# Patient Record
Sex: Male | Born: 1945
Health system: Southern US, Community
[De-identification: ages and names within clinical notes are randomized; demographics above are authoritative.]

## PROBLEM LIST (undated history)

## (undated) DIAGNOSIS — I10 Essential (primary) hypertension: Secondary | ICD-10-CM

## (undated) DIAGNOSIS — I517 Cardiomegaly: Secondary | ICD-10-CM

## (undated) DIAGNOSIS — C801 Malignant (primary) neoplasm, unspecified: Secondary | ICD-10-CM

## (undated) DIAGNOSIS — Z9289 Personal history of other medical treatment: Secondary | ICD-10-CM

## (undated) DIAGNOSIS — Z8639 Personal history of other endocrine, nutritional and metabolic disease: Secondary | ICD-10-CM

## (undated) DIAGNOSIS — D649 Anemia, unspecified: Secondary | ICD-10-CM

## (undated) DIAGNOSIS — D509 Iron deficiency anemia, unspecified: Secondary | ICD-10-CM

## (undated) DIAGNOSIS — M2550 Pain in unspecified joint: Secondary | ICD-10-CM

## (undated) DIAGNOSIS — R600 Localized edema: Secondary | ICD-10-CM

## (undated) DIAGNOSIS — M1990 Unspecified osteoarthritis, unspecified site: Secondary | ICD-10-CM

## (undated) DIAGNOSIS — G4730 Sleep apnea, unspecified: Secondary | ICD-10-CM

## (undated) DIAGNOSIS — K909 Intestinal malabsorption, unspecified: Secondary | ICD-10-CM

## (undated) DIAGNOSIS — K219 Gastro-esophageal reflux disease without esophagitis: Secondary | ICD-10-CM

## (undated) DIAGNOSIS — R0602 Shortness of breath: Secondary | ICD-10-CM

## (undated) DIAGNOSIS — N200 Calculus of kidney: Secondary | ICD-10-CM

## (undated) DIAGNOSIS — E785 Hyperlipidemia, unspecified: Secondary | ICD-10-CM

## (undated) DIAGNOSIS — D696 Thrombocytopenia, unspecified: Secondary | ICD-10-CM

## (undated) DIAGNOSIS — D45 Polycythemia vera: Secondary | ICD-10-CM

## (undated) DIAGNOSIS — M549 Dorsalgia, unspecified: Secondary | ICD-10-CM

## (undated) DIAGNOSIS — J449 Chronic obstructive pulmonary disease, unspecified: Secondary | ICD-10-CM

## (undated) DIAGNOSIS — N529 Male erectile dysfunction, unspecified: Secondary | ICD-10-CM

## (undated) DIAGNOSIS — N400 Enlarged prostate without lower urinary tract symptoms: Secondary | ICD-10-CM

## (undated) DIAGNOSIS — R05 Cough: Secondary | ICD-10-CM

## (undated) DIAGNOSIS — I6529 Occlusion and stenosis of unspecified carotid artery: Secondary | ICD-10-CM

## (undated) DIAGNOSIS — N486 Induration penis plastica: Secondary | ICD-10-CM

## (undated) HISTORY — DX: Pain in unspecified joint: M25.50

## (undated) HISTORY — DX: Induration penis plastica: N48.6

## (undated) HISTORY — DX: Anemia, unspecified: D64.9

## (undated) HISTORY — DX: Iron deficiency anemia, unspecified: D50.9

## (undated) HISTORY — DX: Personal history of other medical treatment: Z92.89

## (undated) HISTORY — DX: Essential (primary) hypertension: I10

## (undated) HISTORY — DX: Cardiomegaly: I51.7

## (undated) HISTORY — DX: Calculus of kidney: N20.0

## (undated) HISTORY — PX: KNEE SURGERY: SHX244

## (undated) HISTORY — DX: Cough: R05

## (undated) HISTORY — PX: EYE SURGERY: SHX253

## (undated) HISTORY — DX: Polycythemia vera: D45

## (undated) HISTORY — DX: Gastro-esophageal reflux disease without esophagitis: K21.9

## (undated) HISTORY — DX: Personal history of other endocrine, nutritional and metabolic disease: Z86.39

## (undated) HISTORY — DX: Hyperlipidemia, unspecified: E78.5

## (undated) HISTORY — DX: Localized edema: R60.0

## (undated) HISTORY — DX: Malignant (primary) neoplasm, unspecified: C80.1

## (undated) HISTORY — DX: Chronic obstructive pulmonary disease, unspecified: J44.9

## (undated) HISTORY — DX: Thrombocytopenia, unspecified: D69.6

## (undated) HISTORY — PX: SHOULDER SURGERY: SHX246

## (undated) HISTORY — DX: Dorsalgia, unspecified: M54.9

## (undated) HISTORY — PX: APPENDECTOMY: SHX54

## (undated) HISTORY — DX: Occlusion and stenosis of unspecified carotid artery: I65.29

## (undated) HISTORY — DX: Unspecified osteoarthritis, unspecified site: M19.90

## (undated) HISTORY — DX: Intestinal malabsorption, unspecified: K90.9

## (undated) HISTORY — DX: Male erectile dysfunction, unspecified: N52.9

## (undated) HISTORY — DX: Benign prostatic hyperplasia without lower urinary tract symptoms: N40.0

## (undated) HISTORY — DX: Sleep apnea, unspecified: G47.30

## (undated) NOTE — *Deleted (*Deleted)
RN at bedside with doppler. Pt's wife and 2 daughters wanting to record pt's heartbeat on their cell phones for keepsake. Pt lying in bed resting comfortably and denies needs at this time. Family has been swabbing pt's mouth with mouth swabs and helping him sip on water. RN also provided pt with popsicle that family spoon feeds out of a cup. Emotional support provided to pt and family. No additional needs at this time. +

---

## 1999-08-05 ENCOUNTER — Encounter: Payer: Self-pay | Admitting: Gastroenterology

## 1999-08-05 ENCOUNTER — Ambulatory Visit (HOSPITAL_COMMUNITY): Admission: RE | Admit: 1999-08-05 | Discharge: 1999-08-05 | Payer: Self-pay | Admitting: Gastroenterology

## 2002-05-09 ENCOUNTER — Encounter: Payer: Self-pay | Admitting: Specialist

## 2002-05-09 ENCOUNTER — Ambulatory Visit (HOSPITAL_COMMUNITY): Admission: RE | Admit: 2002-05-09 | Discharge: 2002-05-09 | Payer: Self-pay | Admitting: Specialist

## 2003-05-30 ENCOUNTER — Emergency Department (HOSPITAL_COMMUNITY): Admission: EM | Admit: 2003-05-30 | Discharge: 2003-05-31 | Payer: Self-pay | Admitting: Emergency Medicine

## 2005-04-08 ENCOUNTER — Ambulatory Visit: Payer: Self-pay | Admitting: Gastroenterology

## 2005-04-21 ENCOUNTER — Ambulatory Visit: Payer: Self-pay | Admitting: Cardiology

## 2005-04-23 ENCOUNTER — Encounter (INDEPENDENT_AMBULATORY_CARE_PROVIDER_SITE_OTHER): Payer: Self-pay | Admitting: *Deleted

## 2005-04-23 ENCOUNTER — Ambulatory Visit: Payer: Self-pay | Admitting: Gastroenterology

## 2005-04-26 ENCOUNTER — Ambulatory Visit: Payer: Self-pay

## 2007-05-16 ENCOUNTER — Encounter: Payer: Self-pay | Admitting: Pulmonary Disease

## 2007-05-16 ENCOUNTER — Encounter: Payer: Self-pay | Admitting: Internal Medicine

## 2007-05-19 ENCOUNTER — Encounter: Payer: Self-pay | Admitting: Internal Medicine

## 2007-05-22 ENCOUNTER — Encounter: Admission: RE | Admit: 2007-05-22 | Discharge: 2007-05-22 | Payer: Self-pay | Admitting: Family Medicine

## 2007-05-22 DIAGNOSIS — J984 Other disorders of lung: Secondary | ICD-10-CM | POA: Insufficient documentation

## 2007-05-26 ENCOUNTER — Ambulatory Visit: Payer: Self-pay | Admitting: Internal Medicine

## 2007-05-26 DIAGNOSIS — R059 Cough, unspecified: Secondary | ICD-10-CM

## 2007-05-26 DIAGNOSIS — J449 Chronic obstructive pulmonary disease, unspecified: Secondary | ICD-10-CM

## 2007-05-26 DIAGNOSIS — R0602 Shortness of breath: Secondary | ICD-10-CM | POA: Insufficient documentation

## 2007-05-26 DIAGNOSIS — R942 Abnormal results of pulmonary function studies: Secondary | ICD-10-CM | POA: Insufficient documentation

## 2007-05-26 HISTORY — DX: Chronic obstructive pulmonary disease, unspecified: J44.9

## 2007-05-26 HISTORY — DX: Cough, unspecified: R05.9

## 2007-05-29 ENCOUNTER — Ambulatory Visit: Payer: Self-pay | Admitting: Internal Medicine

## 2007-06-09 ENCOUNTER — Ambulatory Visit: Payer: Self-pay | Admitting: Internal Medicine

## 2007-06-15 ENCOUNTER — Telehealth: Payer: Self-pay | Admitting: Internal Medicine

## 2007-07-10 ENCOUNTER — Ambulatory Visit: Payer: Self-pay | Admitting: Internal Medicine

## 2007-11-20 ENCOUNTER — Encounter: Admission: RE | Admit: 2007-11-20 | Discharge: 2007-11-20 | Payer: Self-pay | Admitting: Family Medicine

## 2008-02-19 ENCOUNTER — Ambulatory Visit: Payer: Self-pay | Admitting: Internal Medicine

## 2008-02-19 DIAGNOSIS — J310 Chronic rhinitis: Secondary | ICD-10-CM | POA: Insufficient documentation

## 2008-02-19 DIAGNOSIS — G4733 Obstructive sleep apnea (adult) (pediatric): Secondary | ICD-10-CM | POA: Insufficient documentation

## 2008-02-19 LAB — CONVERTED CEMR LAB
Basophils Absolute: 0 10*3/uL (ref 0.0–0.1)
IgE (Immunoglobulin E), Serum: 6.5 intl units/mL (ref 0.0–180.0)
Lymphocytes Relative: 22.7 % (ref 12.0–46.0)
Monocytes Relative: 3.9 % (ref 3.0–12.0)
Platelets: 103 10*3/uL — ABNORMAL LOW (ref 150–400)
RDW: 12.9 % (ref 11.5–14.6)

## 2008-02-28 ENCOUNTER — Ambulatory Visit: Payer: Self-pay | Admitting: Internal Medicine

## 2008-05-03 ENCOUNTER — Ambulatory Visit: Payer: Self-pay

## 2008-07-17 ENCOUNTER — Encounter: Admission: RE | Admit: 2008-07-17 | Discharge: 2008-07-17 | Payer: Self-pay | Admitting: Family Medicine

## 2009-01-30 ENCOUNTER — Encounter: Admission: RE | Admit: 2009-01-30 | Discharge: 2009-04-10 | Payer: Self-pay | Admitting: Family Medicine

## 2009-06-06 ENCOUNTER — Encounter: Admission: RE | Admit: 2009-06-06 | Discharge: 2009-06-06 | Payer: Self-pay | Admitting: Family Medicine

## 2009-08-27 ENCOUNTER — Ambulatory Visit: Payer: Self-pay | Admitting: Cardiology

## 2009-08-27 DIAGNOSIS — R079 Chest pain, unspecified: Secondary | ICD-10-CM

## 2009-08-27 DIAGNOSIS — R635 Abnormal weight gain: Secondary | ICD-10-CM

## 2009-08-27 DIAGNOSIS — I10 Essential (primary) hypertension: Secondary | ICD-10-CM

## 2009-09-09 ENCOUNTER — Telehealth (INDEPENDENT_AMBULATORY_CARE_PROVIDER_SITE_OTHER): Payer: Self-pay | Admitting: *Deleted

## 2009-09-10 ENCOUNTER — Ambulatory Visit: Payer: Self-pay | Admitting: Cardiology

## 2009-09-10 ENCOUNTER — Ambulatory Visit: Payer: Self-pay

## 2009-09-10 ENCOUNTER — Encounter (HOSPITAL_COMMUNITY): Admission: RE | Admit: 2009-09-10 | Discharge: 2009-11-12 | Payer: Self-pay | Admitting: Cardiology

## 2009-10-06 ENCOUNTER — Encounter: Admission: RE | Admit: 2009-10-06 | Discharge: 2010-01-04 | Payer: Self-pay | Admitting: Orthopedic Surgery

## 2010-04-21 ENCOUNTER — Encounter
Admission: RE | Admit: 2010-04-21 | Discharge: 2010-05-12 | Payer: Self-pay | Source: Home / Self Care | Attending: Orthopedic Surgery | Admitting: Orthopedic Surgery

## 2010-04-27 ENCOUNTER — Encounter: Payer: Self-pay | Admitting: Gastroenterology

## 2010-04-29 ENCOUNTER — Other Ambulatory Visit: Payer: Self-pay | Admitting: Otolaryngology

## 2010-05-03 ENCOUNTER — Encounter: Payer: Self-pay | Admitting: Internal Medicine

## 2010-05-12 NOTE — Assessment & Plan Note (Signed)
Summary: [facility]   Visit Type:  Follow-up Primary Provider:  Dr. [name]  CC:  chest pain.  History of Present Illness: The patient presents for evaluation of chest discomfort. I saw him about 4 years ago for discomfort and sent him for a stress perfusion study. This demonstrated no clear evidence of obstructive coronary disease though there were some mild abnormalities. His ejection fraction was well preserved. In that time he has done well up until a few months ago. He has noticed that doing activities such as getting in firewood has caused him to have some chest discomfort. This is mild and happens with significant exertion. He stopped his doing and it feels better after several minutes. He's not had any radiation to his jaw or to his arms. It is a substernal pressure. It is mild. He thinks he's had some increased dyspnea with exertion but does not describe PND or orthopnea. He does not describe palpitations, presyncope or syncope. Of note since I last saw him he's had 2 young brothers diagnosed with heart disease.  Current Medications (verified): 1)  Niaspan 1000 Mg  Tbcr (Niacin (Antihyperlipidemic)) .... 2 Tabs At Bedtime 2)  Centrum   Tabs (Multiple Vitamins-Minerals) .... Once Daily 3)  Adult Aspirin Low Strength 81 Mg  Tbdp (Aspirin) .... Once Daily 4)  Nexium 40 Mg  Cpdr (Esomeprazole Magnesium) .... Every Am Daily 5)  Uroxatral 10 Mg  Tb24 (Alfuzosin Hcl) .[name].. 1 By Mouth Daily 6)  Aleve 220 Mg  Tabs (Naproxen Sodium) .... As Needed 7)  Celebrex 200 Mg  Caps (Celecoxib) .... As Needed 8)  Veramyst 27.5 Mcg/spray  Susp (Fluticasone Furoate) .... As Needed 9)  Astepro 137 Mcg/spray Soln (Azelastine Hcl) .... 2 Sprays Easch Nostril At Bedtime 10)  Foltx 2.5-25-2 Mg Tabs (Fa-Pyridoxine-Cyancobalamin) .[name].. 1 By Mouth Daily 11)  Crestor 20 Mg Tabs (Rosuvastatin Calcium) .[name].. 1 By Mouth Daily 12)  Fish Oil   Oil (Fish Oil) .... 2 Qam and 1 Qpm 13)  Osteo Bi-Flex Adv Triple  St  Tabs (Misc Natural Products) .[name].. 1 By Mouth Dialy  Allergies (verified): No Known Drug Allergies  Past History:  Past Medical History: Benign prostatic hypertrophy, mild Hyperlipidema Nephrolithiasis Erectile dysfunction Squamous cell cancer resected - skin/nose 2006 Sleep apnea x Dxed [facility] 2006 COPAP Nonobstructive left carotid artery stenosis  Past Surgical History: Appendectomy Knee Resection squamous cell CA nose  Family History: Congestive heart failure..father Myocardial infarction...mother (age 18) 1st degree heart disease...sisters and brothers (brother with stent age 45, brother with stents at age 30)  Social History: Patient is married and retired and has 3 children Quit smoking 38 yrs ago Patient states former smoker.  Radiation protection practitioner for [employer].   Review of Systems       Positive for tinnitus, occasional dizziness, reflux. Otherwise as stated in the history of present illness negative for other systems.  Vital Signs:  Patient profile:   65 year old male Height:      71 inches Weight:      231 pounds BMI:     32.33 Pulse rate:   76 / minute Resp:     16 per minute BP sitting:   142 / 86  (right arm)  Vitals Entered By: Marrion Coy, CNA (Aug 27, 2009 2:14 PM)  Physical Exam  General:  Well developed, well nourished, in no acute distress. Head:  normocephalic and atraumatic Eyes:  PERRLA/EOM intact; conjunctiva and lids normal. Mouth:  Teeth, gums  and palate normal. Oral mucosa normal. Neck:  Neck supple, no JVD. No masses, thyromegaly or abnormal cervical nodes. Chest Wall:  no deformities or breast masses noted Lungs:  Clear bilaterally to auscultation and percussion. Abdomen:  Bowel sounds positive; abdomen soft and non-tender without masses, organomegaly, or hernias noted. No hepatosplenomegaly. Msk:  Back normal, normal gait. Muscle strength and tone normal. Extremities:  No clubbing or cyanosis. Neurologic:  Alert and  oriented x 3. Skin:  Intact without lesions or rashes. Cervical Nodes:  no significant adenopathy Inguinal Nodes:  no significant adenopathy Psych:  Normal affect.   Detailed Cardiovascular Exam  Neck    Carotids: Carotids full and equal bilaterally with left bruit.      Neck Veins: Normal, no JVD.    Heart    Inspection: no deformities or lifts noted.      Palpation: normal PMI with no thrills palpable.      Auscultation: regular rate and rhythm, S1, S2 without murmurs, rubs, gallops, or clicks.    Vascular    Abdominal Aorta: no palpable masses, pulsations, or audible bruits.      Femoral Pulses: normal femoral pulses bilaterally.      Pedal Pulses: normal pedal pulses bilaterally.      Radial Pulses: normal radial pulses bilaterally.      Peripheral Circulation: no clubbing, cyanosis, or edema noted with normal capillary refill.     EKG  Procedure date:  08/27/2009  Findings:      Sinus rhythm, rate 78, axis within normal limits, intervals within normal limits, no acute ST-T wave changes  Impression & Recommendations:  Problem # 1:  CHEST PAIN (ICD-786.50) The patient's chest discomfort has some worrisome features for new onset exertional angina. He has a very strong family history of early heart disease. Therefore, stress perfusion testing is indicated. He should of course also continue with aggressive risk reduction. Orders: Nuclear Stress Test (Nuc Stress Test) EKG w/ Interpretation (93000)  Problem # 2:  ESSENTIAL HYPERTENSION, BENIGN (ICD-401.1) His blood pressure has been elevated of late but he is treating this with therapeutic lifestyle changes. This will include weight loss.  Problem # 3:  DYSLIPIDEMIA (ICD-272.4) He has some mild dyslipidemia that is being followed closely by Dr. Christell Constant. He has an excellent profile. No change in therapy is indicated. I have reviewed the labs.  Patient Instructions: 1)  Your physician recommends that you schedule a follow-up  appointment AFTER TESTING IF NEEDED 2)  Your physician recommends that you continue on your current medications as directed. Please refer to the Current Medication list given to you today. 3)  Your physician has requested that you have an exercise stress myoview.  For further information please visit [email or web address]  Please follow instruction sheet, as given.

## 2010-05-12 NOTE — Assessment & Plan Note (Signed)
Summary: Cardiology Nuclear Study  Nuclear Med Background Indications for Stress Test: Evaluation for Ischemia   History: Myocardial Perfusion Study  History Comments: 1/07 MPS: EF=53%, thinning inf/apex-prob. NL w/ attennation  Symptoms: Chest Pain with Exertion, Chest Pressure with Exertion, DOE    Nuclear Pre-Procedure Cardiac Risk Factors: Carotid Disease, Family History - CAD, History of Smoking, Hypertension, Lipids, PVD Caffeine/Decaff Intake: None NPO After: 8:30 PM Lungs: clear IV 0.9% NS with Angio Cath: 22g     IV Site: (R) Hand IV Started by: Irean Hong RN Chest Size (in) 44-46     Height (in): 72 Weight (lb): 227 BMI: 30.90  Nuclear Med Study 1 or 2 day study:  1 day     Stress Test Type:  Stress Reading MD:  Willa Rough, MD     Referring MD:  J.Hochrein Resting Radionuclide:  Technetium 36m Tetrofosmin     Resting Radionuclide Dose:  10.0 mCi  Stress Radionuclide:  Technetium 39m Tetrofosmin     Stress Radionuclide Dose:  33.0 mCi   Stress Protocol Exercise Time (min):  8:31 min     Max HR:  137 bpm     Predicted Max HR:  155 bpm  Max Systolic BP: 186 mm Hg     Percent Max HR:  88.39 %     METS: 10.20 Rate Pressure Product:  [address]    Stress Test Technologist:  Milana Na EMT-P     Nuclear Technologist:  Domenic Polite CNMT  Rest Procedure  Myocardial perfusion imaging was performed at rest 45 minutes following the intravenous administration of Myoview Technetium 62m Tetrofosmin.  Stress Procedure  The patient exercised for 8:31. The patient stopped due to fatigue and denied any chest pain.  There were no significant ST-T wave changes.  Myoview was injected at peak exercise and myocardial perfusion imaging was performed after a brief delay.  QPS Raw Data Images:  Patient motion noted; appropriate software correction applied. Stress Images:  There is normal uptake in all areas. Rest Images:  Normal homogeneous uptake in all areas of the  myocardium. Subtraction (SDS):  No evidence of ischemia. Transient Ischemic Dilatation:  1.0  (Normal <1.22)  Lung/Heart Ratio:  .29  (Normal <0.45)  Quantitative Gated Spect Images QGS EDV:  136 ml QGS ESV:  61 ml QGS EF:  55 % QGS cine images:  Normal  Findings Normal nuclear study      Overall Impression  Exercise Capacity: Good exercise capacity. BP Response: Normal blood pressure response. Clinical Symptoms: No chest pain ECG Impression: No significant ST segment change suggestive of ischemia. Overall Impression: Normal stress nuclear study.  Appended Document: Cardiology Nuclear Study  LM To CB x2 pfh,rn No evidence of ischemia or infarct.  No further work up is needed.  Appended Document: Cardiology Nuclear Study pt awre of results

## 2010-05-12 NOTE — Progress Notes (Signed)
Summary: Nuclear Pre-Procedure  Phone Note Outgoing Call Call back at [facility] Phone [phone]   Call placed by: Stanton Kidney, EMT-P,  Sep 09, 2009 1:49 PM Call placed to: Patient Action Taken: Phone Call Completed Summary of Call: Reviewed information on Myoview Information Sheet (see scanned document for further details).  Spoke with Patient.    Nuclear Med Background Indications for Stress Test: Evaluation for Ischemia   History: Myocardial Perfusion Study  History Comments: 1/07 MPS: EF=53%, thinning inf/apex-prob. NL w/ attennation  Symptoms: Chest Pain with Exertion, Chest Pressure with Exertion, DOE    Nuclear Pre-Procedure Cardiac Risk Factors: Carotid Disease, Family History - CAD, History of Smoking, Hypertension, Lipids, PVD Height (in): 71

## 2010-05-14 ENCOUNTER — Ambulatory Visit: Payer: Medicare Other | Attending: Orthopedic Surgery | Admitting: *Deleted

## 2010-05-14 DIAGNOSIS — IMO0001 Reserved for inherently not codable concepts without codable children: Secondary | ICD-10-CM | POA: Insufficient documentation

## 2010-05-14 DIAGNOSIS — R5381 Other malaise: Secondary | ICD-10-CM | POA: Insufficient documentation

## 2010-05-14 DIAGNOSIS — M25619 Stiffness of unspecified shoulder, not elsewhere classified: Secondary | ICD-10-CM | POA: Insufficient documentation

## 2010-05-14 DIAGNOSIS — M25519 Pain in unspecified shoulder: Secondary | ICD-10-CM | POA: Insufficient documentation

## 2010-05-14 NOTE — Letter (Signed)
Summary: Colonoscopy Date Change Letter  [facility]  [address] [place], [place] [address]   Phone: [phone]  Fax: [phone]      April 27, 2010 MRN: [id number]   Connor [name] [address] [place], [place]  [address]   Dear Mr. Fritsch,   Previously you were recommended to have a repeat colonoscopy around this time. Your chart was recently reviewed by DR.Danee Soller of [facility]. Follow up colonoscopy is now recommended in 04-2015. This revised recommendation is based on current, nationally recognized guidelines for colorectal cancer screening and polyp surveillance. These guidelines are endorsed by the American Cancer Society, [employer] on Colorectal Cancer as well as numerous other major medical organizations.  Please understand that our recommendation assumes that you do not have any new symptoms such as bleeding, a change in bowel habits, anemia, or significant abdominal discomfort. If you do have any concerning GI symptoms or want to discuss the guideline recommendations, please call to arrange an office visit at your earliest convenience. Otherwise we will keep you in our reminder system and contact you 1-2 months prior to the date listed above to schedule your next colonoscopy.  Thank you,  Barbette Hair. Arlyce Dice, [name]  [facility] [phone]

## 2010-05-19 ENCOUNTER — Ambulatory Visit: Payer: Medicare Other | Admitting: *Deleted

## 2010-05-22 ENCOUNTER — Ambulatory Visit: Payer: Medicare Other | Admitting: Physical Therapy

## 2010-05-26 ENCOUNTER — Ambulatory Visit: Payer: Medicare Other | Admitting: Physical Therapy

## 2010-05-28 ENCOUNTER — Ambulatory Visit: Payer: Medicare Other | Admitting: Physical Therapy

## 2010-06-02 ENCOUNTER — Ambulatory Visit: Payer: Medicare Other | Admitting: Physical Therapy

## 2010-06-04 ENCOUNTER — Ambulatory Visit: Payer: Medicare Other | Admitting: Physical Therapy

## 2010-06-09 ENCOUNTER — Ambulatory Visit: Payer: Medicare Other | Admitting: *Deleted

## 2010-06-11 ENCOUNTER — Ambulatory Visit: Payer: Medicare Other | Attending: Orthopedic Surgery | Admitting: *Deleted

## 2010-06-11 DIAGNOSIS — M25619 Stiffness of unspecified shoulder, not elsewhere classified: Secondary | ICD-10-CM | POA: Insufficient documentation

## 2010-06-11 DIAGNOSIS — IMO0001 Reserved for inherently not codable concepts without codable children: Secondary | ICD-10-CM | POA: Insufficient documentation

## 2010-06-11 DIAGNOSIS — R5381 Other malaise: Secondary | ICD-10-CM | POA: Insufficient documentation

## 2010-06-11 DIAGNOSIS — M25519 Pain in unspecified shoulder: Secondary | ICD-10-CM | POA: Insufficient documentation

## 2010-06-16 ENCOUNTER — Ambulatory Visit: Payer: Medicare Other | Admitting: *Deleted

## 2010-07-29 ENCOUNTER — Ambulatory Visit (INDEPENDENT_AMBULATORY_CARE_PROVIDER_SITE_OTHER): Payer: Medicare Other | Admitting: Gastroenterology

## 2010-07-29 ENCOUNTER — Encounter: Payer: Self-pay | Admitting: Gastroenterology

## 2010-07-29 VITALS — BP 110/70 | HR 88 | Ht 71.0 in | Wt 231.6 lb

## 2010-07-29 DIAGNOSIS — K219 Gastro-esophageal reflux disease without esophagitis: Secondary | ICD-10-CM

## 2010-07-29 NOTE — Patient Instructions (Addendum)
Your EGD is scheduled on 08/17/2010 at 8:30am at [facility] Endoscopy department You will stay on your medications

## 2010-07-29 NOTE — Assessment & Plan Note (Addendum)
With long-standing GERD Barrett's esophagus should be ruled out. His complaints of throat fullness and constant desire to clear his throat could be a manifestation of reflux.  Recommendations #1 continue current PPIs. #2 upper endoscopy with bravo pH probe while patient is on his medications #3 trial of guafenisin

## 2010-07-29 NOTE — Progress Notes (Signed)
History of Present Illness:  Connor [name] is a pleasant, 65 year old white male referred at the request of Dr. Christell Constant for evaluation of reflux. He's been taking PPIs for years. When he stops he has recurrent pyrosis. His main complaint is a fullness in his throat with a constant desire to clear his throat and expectorate. He's undergone allergy testing in the past. He denies dysphagia or odynophagia. Colonoscopy in 2007 demonstrated a non-adenomatous polyp.    Review of Systems: Positive for sinus congestion and occasional knee and back pain Pertinent positive and negative review of systems were noted in the above HPI section. All other review of systems were otherwise negative.    Current Medications, Allergies, Past Medical History, Past Surgical History, Family History and Social History were reviewed in [employer] [facility] electronic medical record  Vital signs were reviewed in today's medical record. Physical Exam: General: Well developed , well nourished, no acute distress Head: Normocephalic and atraumatic Eyes:  sclerae anicteric, EOMI Ears: Normal auditory acuity Mouth: No deformity or lesions Lungs: Clear throughout to auscultation Heart: Regular rate and rhythm; no murmurs, rubs or bruits Abdomen: Soft, non tender and non distended. No masses, hepatosplenomegaly or hernias noted. Normal Bowel sounds Rectal:deferred Musculoskeletal: Symmetrical with no gross deformities  Pulses:  Normal pulses noted Extremities: No clubbing, cyanosis, edema or deformities noted Neurological: Alert oriented x 4, grossly nonfocal Psychological:  Alert and cooperative. Normal mood and affect

## 2010-07-30 ENCOUNTER — Encounter: Payer: Self-pay | Admitting: Gastroenterology

## 2010-08-05 ENCOUNTER — Encounter: Payer: Self-pay | Admitting: Pulmonary Disease

## 2010-08-05 ENCOUNTER — Ambulatory Visit (INDEPENDENT_AMBULATORY_CARE_PROVIDER_SITE_OTHER): Payer: Medicare Other | Admitting: Pulmonary Disease

## 2010-08-05 VITALS — BP 140/80 | HR 81 | Temp 98.2°F | Wt 231.2 lb

## 2010-08-05 DIAGNOSIS — R05 Cough: Secondary | ICD-10-CM

## 2010-08-05 DIAGNOSIS — G4730 Sleep apnea, unspecified: Secondary | ICD-10-CM

## 2010-08-05 NOTE — Assessment & Plan Note (Addendum)
He has a prior diagnosis of sleep apnea.  He has done relatively well with CPAP.  His machine is old, and he is due for a new machine.    Will arrange for auto CPAP titration at home to determine optimal pressure setting.  Will call him with results.  Advised that he may need repeat sleep study if there are any difficulties with his set up.  I explained how sleep apnea can affect the patient's health.  Driving precautions and importance of weight loss were discussed.

## 2010-08-05 NOTE — Assessment & Plan Note (Signed)
He has extensive prior evaluation by Dr. Marchelle Gearing.  He is being followed by Dr. Arlyce Dice for reflux contributing to his cough.  Depending on his status will determine if any additional interventions are needed for this.

## 2010-08-05 NOTE — Patient Instructions (Signed)
Will check CPAP pressure and arrange for new CPAP machine Follow up in 2 months

## 2010-08-05 NOTE — Progress Notes (Signed)
Subjective:    Patient ID: Connor [name], male    DOB: Apr 23, 1945, 65 y.o.   MRN: [id number] CC: [name] HPI 65 yo male for evaluation of sleep apnea.  He had a sleep study through Dr. Ilda Mori about 15 yrs ago.  He has been using CPAP since.  He still has the same machine.  He has a nasal mask with chin strap.  He uses the machine every night and feels like this helps.  Sleep evaluation was requested because he has not had follow up for his sleep apnea in several years.  He goes to bed at 11pm.  He falls asleep quickly.  He wakes up once to use the bathroom.  He gets out of bed at 730am.  He feels rested in the morning.  He does not usually take naps unless he does a lot of activity.  He does not use anything to help him sleep or stay awake.  He used to snore, but not since he has been using CPAP.  He has a chronic cough.  He does not usually bring up sputum.  He was seen by Dr. Marchelle Gearing a few years ago.  He had extensive evaluation which was unremarkable.  He is now being seen by Dr. Arlyce Dice with GI for possible reflux causing his cough.  He is scheduled to have endoscopy.  He does get sinus drainage and post-nasal drip.  He was tried on nasal steroid sprays w/o benefit.  The patient denies sleep walking, sleep talking, bruxism, or nightmares.  There is no history of restless legs.  The patient denies sleep hallucinations, sleep paralysis, or cataplexy.  Epworth score is 8 out of 24.  Past Medical History  Diagnosis Date  . Hyperlipemia   . Nephrolithiasis   . Erectile dysfunction   . Sleep apnea     Cpap  . Carotid artery stenosis      Family History  Problem Relation Age of Onset  . Heart disease Mother   . Heart disease Father   . Heart disease Sister   . Heart disease Brother   . Diabetes Mother   . Diabetes Brother   . Colon cancer Neg Hx   . Liver cancer Paternal Grandfather   . Throat cancer Maternal Grandfather      History   Social History  . Marital  Status: Married    Spouse Name: N/A    Number of Children: 3  . Years of Education: N/A   Occupational History  . retired    Social History Main Topics  . Smoking status: Former Smoker    Types: Cigarettes    Quit date: 04/12/1966  . Smokeless tobacco: Never Used   Comment: 4-5 cigs a day  . Alcohol Use: Yes     seldom  . Drug Use: No  . Sexually Active: Not on file   Other Topics Concern  . Not on file   Social History Narrative  . No narrative on file     No Known Allergies   Outpatient Prescriptions Prior to Visit  Medication Sig Dispense Refill  . alfuzosin (UROXATRAL) 10 MG 24 hr tablet Take 10 mg by mouth daily.        . Ascorbic Acid (VITAMIN C) 100 MG tablet Take 100 mg by mouth daily.        [name] aspirin 81 MG tablet Take 81 mg by mouth daily.        . Cholecalciferol (VITAMIN D-3) 5000 UNITS TABS Take  1 tablet by mouth daily.        . Coenzyme Q10 (CO Q 10 PO) Take 1 capsule by mouth daily.        [name] esomeprazole (NEXIUM) 40 MG packet Take 40 mg by mouth daily before breakfast.        . FA-Pyridoxine-Cyancobalamin (FOLTX PO) Take by mouth.        . fish oil-omega-3 fatty acids 1000 MG capsule Take 2 g by mouth daily.       . Misc Natural Products (OSTEO BI-FLEX ADV DOUBLE ST PO) Take 1 capsule by mouth daily.        . niacin (NIASPAN) 1000 MG CR tablet Once a day      . omeprazole (PRILOSEC OTC) 20 MG tablet Take 20 mg by mouth at bedtime.        . Pyridoxine HCl (B-6 PO) Take 1 capsule by mouth daily.        . rosuvastatin (CRESTOR) 20 MG tablet Take 20 mg by mouth daily.        . Azelastine HCl (ASTEPRO NA) by Nasal route as needed.       . celecoxib (CELEBREX) 200 MG capsule Take 200 mg by mouth 2 (two) times daily.        . fluticasone (VERAMYST) 27.5 MCG/SPRAY nasal spray 2 sprays by Nasal route daily.        . multivitamin-iron-minerals-folic acid (CENTRUM) chewable tablet Chew 1 tablet by mouth daily.            Review of Systems  Respiratory: Positive  for cough.        Objective:   Physical Exam Filed Vitals:   08/05/10 1128 08/05/10 1129  BP:  140/80  Pulse:  81  Temp: 98.2 F (36.8 C)   TempSrc: Oral   Weight: 231 lb 3.2 oz (104.872 kg)   SpO2:  97%   General - Healthy, no distress HEENT - PERRLA, EOMI, wears glasses, no sinus tenderness, clear nasal discharge, narrow nasal angles, MP 2, mild erythema posterior pharynx, no exudate, no LAN, no thyromegaly Cardiac - S1S2 regular, no murmur, pulses symmetric Chest - CTA Abd - soft, nontender, normal bowel sounds Ext - no E/C/C Neuro - normal strength, CN intact, A&O x 3 Psych - normal behavior and mood       Assessment & Plan:   OSA (obstructive sleep apnea) He has a prior diagnosis of sleep apnea.  He has done relatively well with CPAP.  His machine is old, and he is due for a new machine.    Will arrange for auto CPAP titration at home to determine optimal pressure setting.  Will call him with results.  Advised that he may need repeat sleep study if there are any difficulties with his set up.  I explained how sleep apnea can affect the patient's health.  Driving precautions and importance of weight loss were discussed.   Cough He has extensive prior evaluation by Dr. Marchelle Gearing.  He is being followed by Dr. Arlyce Dice for reflux contributing to his cough.  Depending on his status will determine if any additional interventions are needed for this.    Updated Medication List Outpatient Encounter Prescriptions as of 08/05/2010  Medication Sig Dispense Refill  . alfuzosin (UROXATRAL) 10 MG 24 hr tablet Take 10 mg by mouth daily.        . Ascorbic Acid (VITAMIN C) 100 MG tablet Take 100 mg by mouth daily.        [name]  aspirin 81 MG tablet Take 81 mg by mouth daily.        . Cholecalciferol (VITAMIN D-3) 5000 UNITS TABS Take 1 tablet by mouth daily.        . Coenzyme Q10 (CO Q 10 PO) Take 1 capsule by mouth daily.        [name] esomeprazole (NEXIUM) 40 MG packet Take 40 mg by mouth  daily before breakfast.        . FA-Pyridoxine-Cyancobalamin (FOLTX PO) Take by mouth.        . fish oil-omega-3 fatty acids 1000 MG capsule Take 2 g by mouth daily.       . Misc Natural Products (OSTEO BI-FLEX ADV DOUBLE ST PO) Take 1 capsule by mouth daily.        . Multiple Vitamins-Minerals (CENTRUM SILVER ULTRA MENS) TABS Take by mouth. Once a day       . niacin (NIASPAN) 1000 MG CR tablet Once a day      . omeprazole (PRILOSEC OTC) 20 MG tablet Take 20 mg by mouth at bedtime.        . Pyridoxine HCl (B-6 PO) Take 1 capsule by mouth daily.        . rosuvastatin (CRESTOR) 20 MG tablet Take 20 mg by mouth daily.        [name] DISCONTD: Azelastine HCl (ASTEPRO NA) by Nasal route as needed.       [name] DISCONTD: celecoxib (CELEBREX) 200 MG capsule Take 200 mg by mouth 2 (two) times daily.        [name] DISCONTD: fluticasone (VERAMYST) 27.5 MCG/SPRAY nasal spray 2 sprays by Nasal route daily.        [name] DISCONTD: multivitamin-iron-minerals-folic acid (CENTRUM) chewable tablet Chew 1 tablet by mouth daily.

## 2010-08-17 ENCOUNTER — Ambulatory Visit (HOSPITAL_COMMUNITY)
Admission: RE | Admit: 2010-08-17 | Discharge: 2010-08-17 | Disposition: A | Discharge status: Home / Self Care | Payer: Medicare Other | Source: Ambulatory Visit | Attending: Gastroenterology | Admitting: Gastroenterology

## 2010-08-17 ENCOUNTER — Encounter: Payer: Medicare Other | Admitting: Gastroenterology

## 2010-08-17 DIAGNOSIS — K219 Gastro-esophageal reflux disease without esophagitis: Secondary | ICD-10-CM

## 2010-08-17 DIAGNOSIS — G4730 Sleep apnea, unspecified: Secondary | ICD-10-CM | POA: Insufficient documentation

## 2010-08-24 DIAGNOSIS — K219 Gastro-esophageal reflux disease without esophagitis: Secondary | ICD-10-CM

## 2010-08-31 ENCOUNTER — Encounter: Payer: Self-pay | Admitting: Gastroenterology

## 2010-09-08 ENCOUNTER — Encounter: Payer: Self-pay | Admitting: Gastroenterology

## 2010-09-08 ENCOUNTER — Ambulatory Visit (INDEPENDENT_AMBULATORY_CARE_PROVIDER_SITE_OTHER): Payer: Medicare Other | Admitting: Gastroenterology

## 2010-09-08 VITALS — BP 132/68 | HR 88 | Ht 71.0 in | Wt 227.0 lb

## 2010-09-08 DIAGNOSIS — K219 Gastro-esophageal reflux disease without esophagitis: Secondary | ICD-10-CM

## 2010-09-08 MED ORDER — DEXLANSOPRAZOLE 60 MG PO CPDR: DELAYED_RELEASE_CAPSULE | ORAL | Status: DC

## 2010-09-08 NOTE — Op Note (Signed)
  NAME:  ONDRA, DEBOARD NO.:  [id number]  MEDICAL RECORD NO.:  [id number]           PATIENT TYPE:  O  LOCATION:  [facility]                         FACILITY[facility]  [facility]  PHYSICIAN:  Barbette Hair. Arlyce Dice, MD,FACGDATE OF BIRTH:  [birth year]  DATE OF PROCEDURE:  08/17/2010 DATE OF DISCHARGE:  08/17/2010                              OPERATIVE REPORT   PROCEDURE:  Bravo pH study.  A 48-hour Bravo pH study was performed with the patient on his PPI medication.  FINDINGS: 1. On day #1, there were 12 episodes of reflux with 2 episodes lasting     more than 2 minutes, total time with pH less than 4 was 157     minutes.  Fraction of time with pH less than 4 was 12.4.  Total     DeMeester score was 38.9. 2. There were no readings from the second day.  There was no clinical     correlation noticed.  IMPRESSION:  Incomplete study with one day of monitoring that demonstrated significant gastroesophageal reflux.     Barbette Hair. Arlyce Dice, MD,FACG     RDK/MEDQ  D:  08/24/2010  T:  08/24/2010  Job:  [id number]  Electronically Signed by Melvia Heaps MDFACG on 09/08/2010 10:23:12 AM

## 2010-09-08 NOTE — Assessment & Plan Note (Addendum)
This may be responsible for his cough and complaints of throat clearing as determined by his esophageal pH study that showed significant acid reflux.  Recommendations #1 trial of dexilant 60 mg before breakfast and dinner #2 if not improved I would proceed with gastric emptying scan.

## 2010-09-08 NOTE — Progress Notes (Signed)
History of Present Illness:  Connor [name] has returned following upper endoscopy with bravo pH probe placement. Recordings were only available for day one. On that day he had significant gastroesophageal reflux as determined by his DeMeester score. This was while he was taking Nexium. He continues to complain of a constant need to clear his throat.   Review of Systems: Pertinent positive and negative review of systems were noted in the above HPI section. All other review of systems were otherwise negative.    Current Medications, Allergies, Past Medical History, Past Surgical History, Family History and Social History were reviewed in [employer] electronic medical record  Vital signs were reviewed in today's medical record. Physical Exam: General: Well developed , well nourished, no acute distress t

## 2010-09-09 ENCOUNTER — Telehealth: Payer: Self-pay | Admitting: Pulmonary Disease

## 2010-09-09 ENCOUNTER — Encounter: Payer: Self-pay | Admitting: Pulmonary Disease

## 2010-09-09 DIAGNOSIS — G4733 Obstructive sleep apnea (adult) (pediatric): Secondary | ICD-10-CM

## 2010-09-09 NOTE — Telephone Encounter (Signed)
Will have my nurse inform patient that CPAP report showed good control of sleep apnea.  Will arrange for [facility] to set CPAP at 10 cm H2O.

## 2010-09-10 NOTE — Telephone Encounter (Signed)
Pt states the study was not done with his machine and that he was told by Dr. Craige Cotta since his machine was 24+ years old would need to be replaced. Please advise. Thanks.

## 2010-09-11 NOTE — Telephone Encounter (Signed)
Please send order to his DME to arrange for new CPAP machine with CPAP at 10 cm H2O, heated humidity and mask of choice.  Please inform patient of plan.

## 2010-09-11 NOTE — Telephone Encounter (Signed)
Order placed. lmom advising to call back if any questions.

## 2010-09-14 ENCOUNTER — Telehealth: Payer: Self-pay | Admitting: Gastroenterology

## 2010-09-14 NOTE — Telephone Encounter (Signed)
Pt states that Dr. Arlyce Dice started him on Dexilant Wednesday and by Friday he was having diarrhea, stomach pain, and gas. He had diarrhea on Friday and Saturday and stopped the dexilant. Pt was taking Nexium. Should pt resume Nexium or another PPI. Please advise.

## 2010-09-14 NOTE — Telephone Encounter (Signed)
The patient was taking dexilant twice a day have him reduce it to once a day. If he is having symptoms with once a day dexilant he should discontinue this and try Nexium twice a day.

## 2010-09-14 NOTE — Telephone Encounter (Signed)
Pt aware of Dr. Kaplan's recommendations. 

## 2010-09-15 ENCOUNTER — Encounter: Payer: Self-pay | Admitting: Pulmonary Disease

## 2010-09-23 ENCOUNTER — Telehealth: Payer: Self-pay | Admitting: Gastroenterology

## 2010-09-23 MED ORDER — ESOMEPRAZOLE MAGNESIUM 40 MG PO CPDR: DELAYED_RELEASE_CAPSULE | ORAL | Status: DC

## 2010-09-23 NOTE — Telephone Encounter (Signed)
Pt states that the Dexilant is still not working, he is c/o nausea and a lot of gas. Still having problems with reflux. Per previous telephone note Dr. Arlyce Dice had stated that the pt could take Nexium 40mg  bid. Rx sent to pharmacy.

## 2010-10-20 ENCOUNTER — Ambulatory Visit: Payer: Medicare Other | Admitting: Gastroenterology

## 2010-11-25 ENCOUNTER — Encounter: Payer: Self-pay | Admitting: Pulmonary Disease

## 2010-11-25 ENCOUNTER — Ambulatory Visit (INDEPENDENT_AMBULATORY_CARE_PROVIDER_SITE_OTHER): Payer: Medicare Other | Admitting: Pulmonary Disease

## 2010-11-25 DIAGNOSIS — G4733 Obstructive sleep apnea (adult) (pediatric): Secondary | ICD-10-CM

## 2010-11-25 DIAGNOSIS — R05 Cough: Secondary | ICD-10-CM

## 2010-11-25 NOTE — Assessment & Plan Note (Signed)
2nd to reflux.  Advised to follow up with GI.

## 2010-11-25 NOTE — Assessment & Plan Note (Signed)
He is doing well with CPAP.  He is compliant and has benefit from therapy.

## 2010-11-25 NOTE — Patient Instructions (Signed)
Follow-up in one year.

## 2010-11-25 NOTE — Progress Notes (Signed)
Subjective:    Patient ID: Connor [name], male    DOB: March 27, 1946, 65 y.o.   MRN: [id number]  HPI 65 yo male with OSA using CPAP, and chronic cough 2nd to GERD.  He has been sleeping well. He has nasal mask, and no problem with this.  He goes to bed at 1130.  He wakes up at 530 to use the bathroom.  He gets out of bed at 730.  He feels rested in the morning.  He uses his machine every night.  He still gets occasionally cough and clear of throat, especially after eating.  He was not able to tolerate dexilant due to diarrhea.  He has been using nexium twice per day.  Past Medical History  Diagnosis Date  . Hyperlipemia   . Nephrolithiasis   . Erectile dysfunction   . Sleep apnea     Cpap  . Carotid artery stenosis     Review of Systems     Objective:   Physical Exam  BP 150/70  Pulse 75  Temp(Src) 97.9 F (36.6 C) (Oral)  Ht 5' 11.5" (1.816 m)  Wt 229 lb 6.4 oz (104.055 kg)  BMI 31.55 kg/m2  SpO2 98%  General - Healthy, no distress  HEENT - PERRLA, EOMI, wears glasses, no sinus tenderness, clear nasal discharge, narrow nasal angles, MP 2, mild erythema posterior pharynx, no exudate, no LAN, no thyromegaly  Cardiac - S1S2 regular, no murmur, pulses symmetric  Chest - CTA  Abd - soft, nontender, normal bowel sounds  Ext - no E/C/C  Neuro - normal strength, CN intact, A&O x 3  Psych - normal behavior and mood      Assessment & Plan:   OSA (obstructive sleep apnea) He is doing well with CPAP.  He is compliant and has benefit from therapy.  Cough 2nd to reflux.  Advised to follow up with GI.    Updated Medication List Outpatient Encounter Prescriptions as of 11/25/2010  Medication Sig Dispense Refill  . alfuzosin (UROXATRAL) 10 MG 24 hr tablet Take 10 mg by mouth daily.        . Ascorbic Acid (VITAMIN C) 100 MG tablet Take 100 mg by mouth daily.        [name] aspirin 81 MG tablet Take 81 mg by mouth daily.        . Cholecalciferol (VITAMIN D-3) 5000 UNITS TABS Take  1 tablet by mouth daily.        . Coenzyme Q10 (CO Q 10 PO) Take 1 capsule by mouth daily.        [name] esomeprazole (NEXIUM) 40 MG capsule Take 1 capsule two times daily  240 capsule  1  . FA-Pyridoxine-Cyancobalamin (FOLTX PO) Take by mouth.        . fish oil-omega-3 fatty acids 1000 MG capsule Take 2 g by mouth daily.       . Misc Natural Products (OSTEO BI-FLEX ADV DOUBLE ST PO) Take 1 capsule by mouth daily.        . Multiple Vitamins-Minerals (CENTRUM SILVER ULTRA MENS) TABS Take by mouth. Once a day       . niacin (NIASPAN) 1000 MG CR tablet Take 2,000 mg by mouth at bedtime. Takes twice daily      . Pyridoxine HCl (B-6 PO) Take 1 capsule by mouth daily.        . rosuvastatin (CRESTOR) 20 MG tablet Take 20 mg by mouth daily.        [name] DISCONTD:  dexlansoprazole (DEXILANT) 60 MG capsule Take one half hour before breakfast and dinner  60 capsule  2  . DISCONTD: omeprazole (PRILOSEC OTC) 20 MG tablet Take 20 mg by mouth at bedtime.

## 2010-12-09 ENCOUNTER — Telehealth: Payer: Self-pay | Admitting: Gastroenterology

## 2010-12-09 NOTE — Telephone Encounter (Signed)
Pts wife states Dr. Arlyce Dice placed her husband on Dexilant and it did not work for his reflux. He was then placed on Nexium BID. She reports that it isn't working either. Wife wants to know if perhaps he could try Prevacid, she takes it and likes it. Please advise.

## 2010-12-10 ENCOUNTER — Telehealth: Payer: Self-pay

## 2010-12-10 MED ORDER — LANSOPRAZOLE 15 MG PO CPDR: 15.0000 mg | DELAYED_RELEASE_CAPSULE | Freq: Every day | ORAL | Status: DC

## 2010-12-10 NOTE — Telephone Encounter (Signed)
Prevacid 15mg  sent to pharmacy, pt aware.

## 2010-12-10 NOTE — Telephone Encounter (Signed)
ok 

## 2010-12-10 NOTE — Telephone Encounter (Signed)
Received call from pharmacist, Prevacid rx that was sent in was for 15mg . Pt had been on Nexium 40mg  BID. Pharmacist questioning dose since Nexium dose was higher. Pharmacist instructed to change rx to Prevacid 30mg .

## 2011-02-09 ENCOUNTER — Other Ambulatory Visit: Payer: Self-pay | Admitting: Gastroenterology

## 2011-04-13 HISTORY — PX: NOSE SURGERY: SHX723

## 2011-07-02 DIAGNOSIS — R220 Localized swelling, mass and lump, head: Secondary | ICD-10-CM | POA: Diagnosis not present

## 2011-07-02 DIAGNOSIS — H2510 Age-related nuclear cataract, unspecified eye: Secondary | ICD-10-CM | POA: Diagnosis not present

## 2011-07-06 DIAGNOSIS — R7989 Other specified abnormal findings of blood chemistry: Secondary | ICD-10-CM | POA: Diagnosis not present

## 2011-07-06 DIAGNOSIS — E785 Hyperlipidemia, unspecified: Secondary | ICD-10-CM | POA: Diagnosis not present

## 2011-07-06 DIAGNOSIS — Z125 Encounter for screening for malignant neoplasm of prostate: Secondary | ICD-10-CM | POA: Diagnosis not present

## 2011-07-06 DIAGNOSIS — E559 Vitamin D deficiency, unspecified: Secondary | ICD-10-CM | POA: Diagnosis not present

## 2011-07-06 DIAGNOSIS — R5381 Other malaise: Secondary | ICD-10-CM | POA: Diagnosis not present

## 2011-07-28 ENCOUNTER — Encounter: Payer: Self-pay | Admitting: Gastroenterology

## 2011-07-28 ENCOUNTER — Ambulatory Visit (INDEPENDENT_AMBULATORY_CARE_PROVIDER_SITE_OTHER): Payer: Medicare Other | Admitting: Gastroenterology

## 2011-07-28 VITALS — BP 140/90 | HR 74 | Ht 71.0 in | Wt 235.0 lb

## 2011-07-28 DIAGNOSIS — K219 Gastro-esophageal reflux disease without esophagitis: Secondary | ICD-10-CM | POA: Diagnosis not present

## 2011-07-28 NOTE — Patient Instructions (Signed)
We are giving you Zegerid Samples today Your Gastric Emptying scan is scheduled on 08/06/2011 at 9:45  Nothing to eat or drink after midnight No Stomach medications 24 hours prior to test

## 2011-07-28 NOTE — Progress Notes (Signed)
History of Present Illness:  Connor [name] is here for follow up of reflux. Despite taking twice a day  PPI and H2 receptor antagonist he continues to have frequent pyrosis, constant sensation of the need to clear his throat, and regurgitation of gastric contents. He did not tolerate dexilant. This caused stomach upset including diarrhea.    Review of Systems: Pertinent positive and negative review of systems were noted in the above HPI section. All other review of systems were otherwise negative.    Current Medications, Allergies, Past Medical History, Past Surgical History, Family History and Social History were reviewed in [employer] electronic medical record  Vital signs were reviewed in today's medical record. Physical Exam: General: Well developed , well nourished, no acute distress

## 2011-07-28 NOTE — Assessment & Plan Note (Addendum)
He continues to be symptomatic despite high-dose PPI therapy along with H2 receptor antagonist therapy. Prior pH study over one day demonstrated significant reflux.  Recommendations #1 gastric emptying scan #2 trial of AcipHex 20 mg twice a day  If the patient is not improved with AcipHex and his gastric emptying scan is normal then I will refer him for consideration of a fundoplication

## 2011-08-03 ENCOUNTER — Other Ambulatory Visit: Payer: Self-pay | Admitting: Otolaryngology

## 2011-08-03 DIAGNOSIS — J3489 Other specified disorders of nose and nasal sinuses: Secondary | ICD-10-CM | POA: Diagnosis not present

## 2011-08-03 DIAGNOSIS — C44320 Squamous cell carcinoma of skin of unspecified parts of face: Secondary | ICD-10-CM | POA: Diagnosis not present

## 2011-08-03 DIAGNOSIS — C300 Malignant neoplasm of nasal cavity: Secondary | ICD-10-CM | POA: Diagnosis not present

## 2011-08-06 ENCOUNTER — Encounter (HOSPITAL_COMMUNITY)
Admission: RE | Admit: 2011-08-06 | Discharge: 2011-08-06 | Disposition: A | Discharge status: Home / Self Care | Payer: Medicare Other | Source: Ambulatory Visit | Attending: Gastroenterology | Admitting: Gastroenterology

## 2011-08-06 ENCOUNTER — Encounter (HOSPITAL_COMMUNITY): Payer: Self-pay

## 2011-08-06 DIAGNOSIS — R143 Flatulence: Secondary | ICD-10-CM | POA: Diagnosis not present

## 2011-08-06 DIAGNOSIS — K219 Gastro-esophageal reflux disease without esophagitis: Secondary | ICD-10-CM | POA: Insufficient documentation

## 2011-08-06 DIAGNOSIS — R141 Gas pain: Secondary | ICD-10-CM | POA: Insufficient documentation

## 2011-08-06 DIAGNOSIS — R142 Eructation: Secondary | ICD-10-CM | POA: Diagnosis not present

## 2011-08-06 MED ORDER — TECHNETIUM TC 99M SULFUR COLLOID
2.1000 | Freq: Once | INTRAVENOUS | Status: AC | PRN
  Administered 2011-08-06: 2.1 via ORAL

## 2011-08-09 ENCOUNTER — Other Ambulatory Visit: Payer: Self-pay | Admitting: Gastroenterology

## 2011-08-16 ENCOUNTER — Telehealth: Payer: Self-pay

## 2011-08-16 ENCOUNTER — Other Ambulatory Visit: Payer: Self-pay | Admitting: Gastroenterology

## 2011-08-16 NOTE — Telephone Encounter (Signed)
Message copied by Chrystie Nose on Mon Aug 16, 2011  3:19 PM ------      Message from: Marnette Burgess      Created: Wed Aug 11, 2011 10:34 AM      Regarding: RE: Appt       Patient is scheduled to see Dr. Luretha Murphy on 09/15/11 @ 9:20am, arrive @ 8:50am.  Patient will need an Upper GI series done before their appointment, please.  If you have any questions please call [phone].            Thank You,      Elane Fritz      ----- Message -----         From: Lily Lovings, RN         Sent: 08/11/2011   9:36 AM           To: Marnette Burgess      Subject: RE: Appt                                                 Ok I will wait to hear from you.            Thanks,      Bonita Quin      ----- Message -----         From: Marnette Burgess         Sent: 08/10/2011   8:53 AM           To: Lily Lovings, RN      Subject: RE: Appt                                                 Bonita Quin,                On this type of surgery there is a series of tests that are required by the surgeons by the time of the patient's appointment.  I am having Dr Kristin Bruins nurse look at this patient's chart to check behind me and make sure we have what we need in order to make this appointment.  As soon as she lets me know if she everything Dr. Daphine Deutscher needs I will let you know and if we need some tests I will let you know what we need done before the patient's appointment.  So as soon as I hear from her I will make the appointment and let you know what we need if anything.  If you have any questions please call me @ [phone].            Thank You,      Elane Fritz                  ----- Message -----         From: Lily Lovings, RN         Sent: 08/09/2011   3:30 PM           To: Marnette Burgess      Subject: Appt  Elane Fritz,            Dr. Arlyce Dice would like this pt to see Dr. Wenda Low for consideration of fundoplication surgery.            Thanks,      Selinda Michaels

## 2011-08-16 NOTE — Telephone Encounter (Signed)
Pt scheduled for upper gi at [facility] 08/24/11 arrival time 10:45am for an 11am appt. Pt to be NPO after 5am. Pt aware of appt date and time with Dr. Daphine Deutscher.

## 2011-08-17 DIAGNOSIS — H109 Unspecified conjunctivitis: Secondary | ICD-10-CM | POA: Diagnosis not present

## 2011-08-24 ENCOUNTER — Ambulatory Visit (HOSPITAL_COMMUNITY)
Admission: RE | Admit: 2011-08-24 | Discharge: 2011-08-24 | Disposition: A | Discharge status: Home / Self Care | Payer: Medicare Other | Source: Ambulatory Visit | Attending: Gastroenterology | Admitting: Gastroenterology

## 2011-08-24 ENCOUNTER — Other Ambulatory Visit: Payer: Self-pay | Admitting: Gastroenterology

## 2011-08-24 DIAGNOSIS — R0789 Other chest pain: Secondary | ICD-10-CM | POA: Insufficient documentation

## 2011-08-24 DIAGNOSIS — K219 Gastro-esophageal reflux disease without esophagitis: Secondary | ICD-10-CM | POA: Diagnosis not present

## 2011-08-24 DIAGNOSIS — K449 Diaphragmatic hernia without obstruction or gangrene: Secondary | ICD-10-CM | POA: Diagnosis not present

## 2011-08-27 ENCOUNTER — Ambulatory Visit: Payer: Medicare Other | Admitting: Gastroenterology

## 2011-08-30 ENCOUNTER — Ambulatory Visit (INDEPENDENT_AMBULATORY_CARE_PROVIDER_SITE_OTHER): Payer: Medicare Other | Admitting: Pulmonary Disease

## 2011-08-30 ENCOUNTER — Encounter: Payer: Self-pay | Admitting: Pulmonary Disease

## 2011-08-30 ENCOUNTER — Ambulatory Visit: Payer: Medicare Other | Admitting: Internal Medicine

## 2011-08-30 VITALS — BP 130/76 | HR 80 | Temp 98.2°F | Ht 71.5 in | Wt 236.4 lb

## 2011-08-30 DIAGNOSIS — G4733 Obstructive sleep apnea (adult) (pediatric): Secondary | ICD-10-CM | POA: Diagnosis not present

## 2011-08-30 DIAGNOSIS — R05 Cough: Secondary | ICD-10-CM

## 2011-08-30 NOTE — Progress Notes (Signed)
Chief Complaint  Patient presents with  . Follow-up    Pt had CXR 07/15/11--showed emphysema according to report.  . Sleep Apnea    Pt states he wears his cpap machine everynight. denies any problems w/ mask/machine    History of Present Illness: Connor [name] is a 66 y.o. male former smoker with OSA and chronic cough.  Chest xray from 07/15/11 showed mild hyperinflation.  As a result he was concerned that he could have COPD.  He has not used inhalers.  He is a former smoker.  He usually does not have trouble with his breathing.  He will get winded and a burning feeling when he mows the lawn.  He will also have to purse lip breath when this happens.  He denies sputum, or wheeze.  He will occasional get sinus congestion, but not on a regular basis.    He is followed by GI for his reflux.  He is due to see Dr. Daphine Deutscher with surgery for possible intervention for his reflux.  He feels CPAP works.  He does not snore when using the machine.  He has been falling asleep more often when sitting on the cough, and feeling more tired during the day.   Past Medical History  Diagnosis Date  . Hyperlipemia   . Nephrolithiasis   . Erectile dysfunction   . Sleep apnea     Cpap  . Carotid artery stenosis   . Cough 05/26/2007    Past Surgical History  Procedure Date  . Appendectomy   . Knee surgery     right  . Shoulder surgery     left    Allergies  Allergen Reactions  . Dexlansoprazole     Physical Exam:  Blood pressure 130/76, pulse 80, temperature 98.2 F (36.8 C), temperature source Oral, height 5' 11.5" (1.816 m), weight 236 lb 6.4 oz (107.23 kg), SpO2 99.00%.  Body mass index is 32.51 kg/(m^2).  Wt Readings from Last 2 Encounters:  08/30/11 236 lb 6.4 oz (107.23 kg)  07/28/11 235 lb (106.595 kg)    General - Healthy, no distress  HEENT - PERRLA, EOMI, wears glasses, no sinus tenderness, clear nasal discharge, narrow nasal angles, MP 2, mild erythema posterior pharynx, no  exudate, no LAN, no thyromegaly  Cardiac - S1S2 regular, no murmur, pulses symmetric  Chest - CTA  Abd - soft, nontender, normal bowel sounds  Ext - no E/C/C  Neuro - normal strength, CN intact, A&O x 3  Psych - normal behavior and mood   Assessment/Plan:  Outpatient Encounter Prescriptions as of 08/30/2011  Medication Sig Dispense Refill  . alfuzosin (UROXATRAL) 10 MG 24 hr tablet Take 10 mg by mouth daily.        . Ascorbic Acid (VITAMIN C) 100 MG tablet Take 100 mg by mouth daily.        [name] aspirin 81 MG tablet Take 81 mg by mouth daily.        . Cholecalciferol (VITAMIN D-3) 5000 UNITS TABS Take 1 tablet by mouth daily.        . Coenzyme Q10 (CO Q 10 PO) Take 1 capsule by mouth daily.        [name] esomeprazole (NEXIUM) 40 MG capsule Take 1 capsule two times daily  240 capsule  1  . FA-Pyridoxine-Cyancobalamin (FOLTX PO) Take by mouth.        . fish oil-omega-3 fatty acids 1000 MG capsule Take 2 g by mouth daily.       [name]  Misc Natural Products (OSTEO BI-FLEX ADV DOUBLE ST PO) Take 1 capsule by mouth daily.        . Multiple Vitamins-Minerals (CENTRUM SILVER ULTRA MENS) TABS Take by mouth. Once a day       . niacin (NIASPAN) 1000 MG CR tablet Take 2,000 mg by mouth at bedtime. Takes twice daily      . Pyridoxine HCl (B-6 PO) Take 1 capsule by mouth daily.        . rosuvastatin (CRESTOR) 20 MG tablet Take 20 mg by mouth daily.        [name] DISCONTD: PREVACID 30 MG capsule TAKE (1) CAPSULE DAILY  30 each  6    Freeda Spivey Pager:  [phone] 08/30/2011, 3:55 PM

## 2011-08-30 NOTE — Patient Instructions (Signed)
Will schedule breathing test (PFT) and call with results Will get CPAP report and call with results Follow up in one year

## 2011-08-30 NOTE — Assessment & Plan Note (Signed)
Likely from GERD.  He may need surgical intervention to control his reflux.  He does also have history of smoking.  Recent chest xray showed mild emphysematous changes.  He does occasionally get winded with activity.  Will arrange for PFT to further assess for obstructive lung disease.  I don't think he needs empiric inhaler therapy at present.

## 2011-08-30 NOTE — Assessment & Plan Note (Signed)
He has been falling asleep more frequently during the day.  Will get his CPAP download and call him with results.

## 2011-09-09 ENCOUNTER — Ambulatory Visit (INDEPENDENT_AMBULATORY_CARE_PROVIDER_SITE_OTHER): Payer: Medicare Other | Admitting: Pulmonary Disease

## 2011-09-09 DIAGNOSIS — R05 Cough: Secondary | ICD-10-CM | POA: Diagnosis not present

## 2011-09-09 LAB — PULMONARY FUNCTION TEST

## 2011-09-09 NOTE — Progress Notes (Signed)
PFT done today. 

## 2011-09-15 ENCOUNTER — Encounter: Payer: Self-pay | Admitting: Pulmonary Disease

## 2011-09-15 ENCOUNTER — Ambulatory Visit (INDEPENDENT_AMBULATORY_CARE_PROVIDER_SITE_OTHER): Payer: Medicare Other | Admitting: Surgery

## 2011-09-15 ENCOUNTER — Telehealth: Payer: Self-pay | Admitting: Pulmonary Disease

## 2011-09-15 DIAGNOSIS — R05 Cough: Secondary | ICD-10-CM

## 2011-09-15 MED ORDER — IPRATROPIUM-ALBUTEROL 20-100 MCG/ACT IN AERS: 1.0000 | INHALATION_SPRAY | Freq: Four times a day (QID) | RESPIRATORY_TRACT | Status: DC | PRN

## 2011-09-15 NOTE — Telephone Encounter (Signed)
PFT 09/09/11>>FEV1 3.06 (95%), FEV1% 69, FEF 25-75% 1.70 (59%), TLC 7.67 (109%), DLCO 108%, no BD  Discussed results with patient.  Explained he has mild obstruction.  Will start combivent two puffs as needed, and monitor his clinical response.

## 2011-09-23 ENCOUNTER — Ambulatory Visit (INDEPENDENT_AMBULATORY_CARE_PROVIDER_SITE_OTHER): Payer: Medicare Other | Admitting: Surgery

## 2011-09-23 ENCOUNTER — Encounter (INDEPENDENT_AMBULATORY_CARE_PROVIDER_SITE_OTHER): Payer: Self-pay | Admitting: Surgery

## 2011-09-23 ENCOUNTER — Other Ambulatory Visit (INDEPENDENT_AMBULATORY_CARE_PROVIDER_SITE_OTHER): Payer: Self-pay | Admitting: Surgery

## 2011-09-23 VITALS — BP 152/84 | HR 89 | Temp 98.2°F | Ht 71.5 in | Wt 232.2 lb

## 2011-09-23 DIAGNOSIS — K219 Gastro-esophageal reflux disease without esophagitis: Secondary | ICD-10-CM | POA: Diagnosis not present

## 2011-09-23 NOTE — Progress Notes (Signed)
Chief Complaint:  GERD x 20 yrs  History of Present Illness:  Connor [name] is an 66 y.o. male with a longstanding history of GERD.   He has been worked up by Dr. Arlyce Dice with a good workup including gastric emptying study which was normal, an upper GI series which showed a small hiatal hernia with moderate reflux.  I went over the operation with him and his wife in detail using the booklet to discuss hiatal hernia repair and Nissen fundoplication. I mentioned the complications none limited to perforation, organ injury or injury, or failure of the Nissen to completely control the reflux symptoms. He was to proceed with scheduling.I would plan to do him  in the [place] long under general anesthesia.  Past Medical History  Diagnosis Date  . Hyperlipemia   . Nephrolithiasis   . Erectile dysfunction   . Sleep apnea     Cpap  . Carotid artery stenosis   . Cough 05/26/2007  . COPD, mild 05/26/2007    PFT 09/09/11>>FEV1 3.06 (95%), FEV1% 69, FEF 25-75% 1.70 (59%), TLC 7.67 (109%), DLCO 108%, no BD    . Cancer   . GERD (gastroesophageal reflux disease)     Past Surgical History  Procedure Date  . Appendectomy   . Knee surgery     right  . Shoulder surgery     left  . Nose surgery 2013    Current Outpatient Prescriptions  Medication Sig Dispense Refill  . alfuzosin (UROXATRAL) 10 MG 24 hr tablet Take 10 mg by mouth daily.        . Ascorbic Acid (VITAMIN C) 100 MG tablet Take 100 mg by mouth daily.        [name] aspirin 81 MG tablet Take 81 mg by mouth daily.        . Cholecalciferol (VITAMIN D-3) 5000 UNITS TABS Take 1 tablet by mouth daily.        . Coenzyme Q10 (CO Q 10 PO) Take 1 capsule by mouth daily.        [name] esomeprazole (NEXIUM) 40 MG capsule Take 1 capsule two times daily  240 capsule  1  . FA-Pyridoxine-Cyancobalamin (FOLTX PO) Take by mouth.        . fish oil-omega-3 fatty acids 1000 MG capsule Take 2 g by mouth daily.       . Ipratropium-Albuterol (COMBIVENT RESPIMAT) 20-100  MCG/ACT AERS respimat Inhale 1 puff into the lungs every 6 (six) hours as needed for wheezing or shortness of breath.  1 Inhaler  5  . Misc Natural Products (OSTEO BI-FLEX ADV DOUBLE ST PO) Take 1 capsule by mouth daily.        . Multiple Vitamins-Minerals (CENTRUM SILVER ULTRA MENS) TABS Take by mouth. Once a day       . niacin (NIASPAN) 1000 MG CR tablet Take 2,000 mg by mouth at bedtime. Takes twice daily      . PROCTOZONE-HC 2.5 % rectal cream       . Pyridoxine HCl (B-6 PO) Take 1 capsule by mouth daily.        . rosuvastatin (CRESTOR) 20 MG tablet Take 20 mg by mouth daily.         Dexlansoprazole Family History  Problem Relation Age of Onset  . Heart disease Mother   . Diabetes Mother   . Heart disease Father   . Heart disease Sister   . Heart disease Brother   . Diabetes Brother   . Colon cancer Neg  Hx   . Liver cancer Paternal Grandfather   . Throat cancer Maternal Grandfather    Social History:   reports that he quit smoking about 45 years ago. His smoking use included Cigarettes. He quit after 11 years of use. He has never used smokeless tobacco. He reports that he drinks alcohol. He reports that he does not use illicit drugs.   REVIEW OF SYSTEMS - PERTINENT POSITIVES ONLY: Noncontributory   Physical Exam:   Blood pressure 152/84, pulse 89, temperature 98.2 F (36.8 C), temperature source Temporal, height 5' 11.5" (1.816 m), weight 232 lb 3.2 oz (105.325 kg), SpO2 97.00%. Body mass index is 31.93 kg/(m^2).  Gen:  WDWN WM NAD  Neurological: Alert and oriented to person, place, and time. Motor and sensory function is grossly intact  Head: Normocephalic and atraumatic.  Eyes: Conjunctivae are normal. Pupils are equal, round, and reactive to light. No scleral icterus.  Neck: Normal range of motion. Neck supple. No tracheal deviation or thyromegaly present.  Cardiovascular:  SR without murmurs or gallops.  No carotid bruits Respiratory: Effort normal.  No respiratory  distress. No chest wall tenderness. Breath sounds normal.  No wheezes, rales or rhonchi.  Abdomen:  Nontender.  I showed him where trocar sites would likely be.   GU: Musculoskeletal: Normal range of motion. Extremities are nontender. No cyanosis, edema or clubbing noted Lymphadenopathy: No cervical, preauricular, postauricular or axillary adenopathy is present Skin: Skin is warm and dry. No rash noted. No diaphoresis. No erythema. No pallor. Pscyh: Normal mood and affect. Behavior is normal. Judgment and thought content normal.   LABORATORY RESULTS: No results found for this or any previous visit (from the past 48 hour(s)).  RADIOLOGY RESULTS: No results found.  Problem List: Patient Active Problem List  Diagnosis  . DYSLIPIDEMIA  . ESSENTIAL HYPERTENSION, BENIGN  . OSA (obstructive sleep apnea)  . COPD, mild  . Esophageal reflux    Assessment & Plan: Small hiatal hernia with chronic cough and history of heartburn off PPI.  Will proceed with proceed with Nissen fundoplication. Patient is aware of the risk and benefits of the procedure.    Matt B. Daphine Deutscher, MD, [facility] [phone] beeper [phone]  09/23/2011 12:28 PM

## 2011-09-23 NOTE — Patient Instructions (Signed)
Nissen Fundoplication Care After Please read the instructions outlined below and refer to this sheet for the next few weeks. These discharge instructions provide you with general information on caring for yourself after you leave the hospital. Your doctor may also give you specific instructions. While your treatment has been planned according to the most current medical practices available, unavoidable complications sometimes happen. If you have any problems or questions after discharge, please call your doctor. ACTIVITY  Take frequent rest periods throughout the day.   Take frequent walks throughout the day. This will help to prevent blood clots.   Continue to do your coughing and deep breathing exercises once you get home. This will help to prevent pneumonia.   No strenuous activities such as heavy lifting, pushing or pulling until after your follow-up visit with your doctor. Do not lift anything heavier than 10 pounds.   Talk with your caregiver about when you may return to work and your exercise routine.   You may shower 2 days after surgery. Pat incisions dry. Do not rub incisions with washcloth or towel.   Do not drive while taking prescription pain medication.  NUTRITION  Continue with a liquid diet, or the diet you were directed to take, until your first follow-up visit with your surgeon.   Drink fluids (6-8 glasses a day).   Call your caregiver for persistent nausea (feeling sick to your stomach), vomiting, bloating or difficulty swallowing.  ELIMINATION It is very important not to strain during bowel movements. If constipation should occur, you may:  Take a mild laxative (such as Milk of Magnesia).   Add fruit and bran to your diet.   Drink more fluids.   Call your caregiver if constipation is not relieved.  FEVER If you feel feverish or have shaking chills, take your temperature. If it is 102 F (38.9 C) or above, call your caregiver. The fever may mean there is an  infection. PAIN CONTROL  If a prescription was given for a pain reliever, please follow your caregiver's directions.   Only take over-the-counter or prescription medicines for pain, discomfort, or fever as directed by your caregiver.   If the pain is not relieved by your medicine, becomes worse, or you have difficulty breathing, call your doctor.  INCISION  It is normal for your cuts (incisions) from surgery to have a small amount of drainage for the first 1-2 days. Once the drainage has stopped, leave your incision(s) open to air.   Check your incision(s) and surrounding area daily for any redness, swelling, increased drainage or bleeding. If any of these are present or if the wound edges start to separate, call your doctor.   If you have small adhesive strips in place, they will peel and fall off. (If these strips are covered with a clear bandage, your doctor will tell you when to remove them.)   If you have staples, your caregiver will remove them at the follow-up appointment.  Document Released: 11/20/2003 Document Revised: 03/18/2011 Document Reviewed: 02/23/2007 [employer] Patient Information 2012 [employer]. 

## 2011-09-28 ENCOUNTER — Encounter: Payer: Self-pay | Admitting: Pulmonary Disease

## 2011-10-15 ENCOUNTER — Other Ambulatory Visit: Payer: Self-pay | Admitting: Gastroenterology

## 2011-11-09 ENCOUNTER — Encounter: Payer: Self-pay | Admitting: Cardiology

## 2011-11-09 DIAGNOSIS — E559 Vitamin D deficiency, unspecified: Secondary | ICD-10-CM | POA: Diagnosis not present

## 2011-11-09 DIAGNOSIS — R5383 Other fatigue: Secondary | ICD-10-CM | POA: Diagnosis not present

## 2011-11-09 DIAGNOSIS — R5381 Other malaise: Secondary | ICD-10-CM | POA: Diagnosis not present

## 2011-11-09 DIAGNOSIS — E119 Type 2 diabetes mellitus without complications: Secondary | ICD-10-CM | POA: Diagnosis not present

## 2011-11-09 DIAGNOSIS — E039 Hypothyroidism, unspecified: Secondary | ICD-10-CM | POA: Diagnosis not present

## 2011-11-09 DIAGNOSIS — E785 Hyperlipidemia, unspecified: Secondary | ICD-10-CM | POA: Diagnosis not present

## 2011-11-10 ENCOUNTER — Encounter (HOSPITAL_COMMUNITY): Payer: Self-pay | Admitting: Pharmacy Technician

## 2011-11-12 ENCOUNTER — Telehealth: Payer: Self-pay | Admitting: Gastroenterology

## 2011-11-12 NOTE — Telephone Encounter (Signed)
Forward 5 pages from [facility] to Dr. Melvia Heaps for review on 11-12-11 ym

## 2011-11-15 ENCOUNTER — Encounter (HOSPITAL_COMMUNITY)
Admission: RE | Admit: 2011-11-15 | Discharge: 2011-11-15 | Disposition: A | Discharge status: Home / Self Care | Payer: Medicare Other | Source: Ambulatory Visit | Attending: Surgery | Admitting: Surgery

## 2011-11-15 ENCOUNTER — Encounter (HOSPITAL_COMMUNITY): Payer: Self-pay

## 2011-11-15 ENCOUNTER — Telehealth (INDEPENDENT_AMBULATORY_CARE_PROVIDER_SITE_OTHER): Payer: Self-pay

## 2011-11-15 HISTORY — DX: Shortness of breath: R06.02

## 2011-11-15 LAB — CBC
Hemoglobin: 12.2 g/dL — ABNORMAL LOW (ref 13.0–17.0)
MCH: 23.2 pg — ABNORMAL LOW (ref 26.0–34.0)
MCV: 76 fL — ABNORMAL LOW (ref 78.0–100.0)
RBC: 5.26 MIL/uL (ref 4.22–5.81)
WBC: 4.7 10*3/uL (ref 4.0–10.5)

## 2011-11-15 LAB — BASIC METABOLIC PANEL
CO2: 28 mEq/L (ref 19–32)
Calcium: 9.1 mg/dL (ref 8.4–10.5)
Chloride: 106 mEq/L (ref 96–112)
Creatinine, Ser: 0.98 mg/dL (ref 0.50–1.35)
Glucose, Bld: 108 mg/dL — ABNORMAL HIGH (ref 70–99)
Sodium: 140 mEq/L (ref 135–145)

## 2011-11-15 NOTE — Progress Notes (Signed)
08/30/11 Last office visit with Pulmonary- in [facility]  EKG- 07/15/11 EKG and CXR on chart

## 2011-11-15 NOTE — Telephone Encounter (Signed)
The patient called about what medications to stop preop and I told him to stop his Aspirin and his fish oil.

## 2011-11-15 NOTE — Progress Notes (Signed)
CBC results of 11/15/11 sent to Inbox of Dr Sheron Nightingale.

## 2011-11-15 NOTE — Progress Notes (Signed)
Abnormal CBC results sent to inbox of Dr Luretha Murphy.

## 2011-11-15 NOTE — Patient Instructions (Addendum)
[name]  11/15/2011   Your procedure is scheduled on:  11/24/11 1130am-200pm  Report to [facility] at 0900 AM.  Call this number if you have problems the morning of surgery: [phone]   Remember:   Do not eat food:After Midnight.  May have clear liquids:until Midnight .    Take these medicines the morning of surgery with A SIP OF WATER:    Do not wear jewelry.  Do not wear lotions, powders, or perfumes.   . Men may shave face and neck.  Do not bring valuables to the hospital.  Contacts, dentures or bridgework may not be worn into surgery.  Leave suitcase in the car. After surgery it may be brought to your room.  For patients admitted to the hospital, checkout time is 11:00 AM the day of discharge.     Special Instructions: CHG Shower Use Special Wash: 1/2 bottle night before surgery and 1/2 bottle morning of surgery. shower chin to toes with CHG.  Wash face and private parts with regular soap.    Please read over the following fact sheets that you were given: MRSA Information, coughing and deep breathing exercises, leg exercises

## 2011-11-16 DIAGNOSIS — Z1212 Encounter for screening for malignant neoplasm of rectum: Secondary | ICD-10-CM | POA: Diagnosis not present

## 2011-11-17 DIAGNOSIS — Z0000 Encounter for general adult medical examination without abnormal findings: Secondary | ICD-10-CM | POA: Diagnosis not present

## 2011-11-18 ENCOUNTER — Telehealth: Payer: Self-pay | Admitting: Gastroenterology

## 2011-11-18 NOTE — Telephone Encounter (Signed)
Forward 5 pages from [employer] to Dr. Melvia Heaps for review on 11-18-11 ym

## 2011-11-23 NOTE — H&P (Addendum)
Chief Complaint: GERD x 20 yrs  History of Present Illness: Connor [name] is an 66 y.o. male with a longstanding history of GERD. He has been worked up by Dr. Arlyce Dice with a good workup including gastric emptying study which was normal, an upper GI series which showed a small hiatal hernia with moderate reflux.  I went over the operation with him and his wife in detail using the booklet to discuss hiatal hernia repair and Nissen fundoplication. I mentioned the complications none limited to perforation, organ injury or injury, or failure of the Nissen to completely control the reflux symptoms. He was to proceed with scheduling.I would plan to do him in the [place] long under general anesthesia.  Past Medical History   Diagnosis  Date   .  Hyperlipemia    .  Nephrolithiasis    .  Erectile dysfunction    .  Sleep apnea      Cpap   .  Carotid artery stenosis    .  Cough  05/26/2007   .  COPD, mild  05/26/2007     PFT 09/09/11>>FEV1 3.06 (95%), FEV1% 69, FEF 25-75% 1.70 (59%), TLC 7.67 (109%), DLCO 108%, no BD   .  Cancer    .  GERD (gastroesophageal reflux disease)     Past Surgical History   Procedure  Date   .  Appendectomy    .  Knee surgery      right   .  Shoulder surgery      left   .  Nose surgery  2013    Current Outpatient Prescriptions   Medication  Sig  Dispense  Refill   .  alfuzosin (UROXATRAL) 10 MG 24 hr tablet  Take 10 mg by mouth daily.     .  Ascorbic Acid (VITAMIN C) 100 MG tablet  Take 100 mg by mouth daily.     [name]  aspirin 81 MG tablet  Take 81 mg by mouth daily.     .  Cholecalciferol (VITAMIN D-3) 5000 UNITS TABS  Take 1 tablet by mouth daily.     .  Coenzyme Q10 (CO Q 10 PO)  Take 1 capsule by mouth daily.     [name]  esomeprazole (NEXIUM) 40 MG capsule  Take 1 capsule two times daily  240 capsule  1   .  FA-Pyridoxine-Cyancobalamin (FOLTX PO)  Take by mouth.     .  fish oil-omega-3 fatty acids 1000 MG capsule  Take 2 g by mouth daily.     .  Ipratropium-Albuterol  (COMBIVENT RESPIMAT) 20-100 MCG/ACT AERS respimat  Inhale 1 puff into the lungs every 6 (six) hours as needed for wheezing or shortness of breath.  1 Inhaler  5   .  Misc Natural Products (OSTEO BI-FLEX ADV DOUBLE ST PO)  Take 1 capsule by mouth daily.     .  Multiple Vitamins-Minerals (CENTRUM SILVER ULTRA MENS) TABS  Take by mouth. Once a day     .  niacin (NIASPAN) 1000 MG CR tablet  Take 2,000 mg by mouth at bedtime. Takes twice daily     .  PROCTOZONE-HC 2.5 % rectal cream      .  Pyridoxine HCl (B-6 PO)  Take 1 capsule by mouth daily.     .  rosuvastatin (CRESTOR) 20 MG tablet  Take 20 mg by mouth daily.      Dexlansoprazole  Family History   Problem  Relation  Age of Onset   .  Heart disease  Mother    .  Diabetes  Mother    .  Heart disease  Father    .  Heart disease  Sister    .  Heart disease  Brother    .  Diabetes  Brother    .  Colon cancer  Neg Hx    .  Liver cancer  Paternal Grandfather    .  Throat cancer  Maternal Grandfather     Social History: reports that he quit smoking about 45 years ago. His smoking use included Cigarettes. He quit after 11 years of use. He has never used smokeless tobacco. He reports that he drinks alcohol. He reports that he does not use illicit drugs.  REVIEW OF SYSTEMS - PERTINENT POSITIVES ONLY:  Noncontributory  Physical Exam:  Blood pressure 152/84, pulse 89, temperature 98.2 F (36.8 C), temperature source Temporal, height 5' 11.5" (1.816 m), weight 232 lb 3.2 oz (105.325 kg), SpO2 97.00%.  Body mass index is 31.93 kg/(m^2).  Gen: WDWN WM NAD  Neurological: Alert and oriented to person, place, and time. Motor and sensory function is grossly intact  Head: Normocephalic and atraumatic.  Eyes: Conjunctivae are normal. Pupils are equal, round, and reactive to light. No scleral icterus.  Neck: Normal range of motion. Neck supple. No tracheal deviation or thyromegaly present.  Cardiovascular: SR without murmurs or gallops. No carotid bruits    Respiratory: Effort normal. No respiratory distress. No chest wall tenderness. Breath sounds normal. No wheezes, rales or rhonchi.  Abdomen: Nontender. I showed him where trocar sites would likely be.  GU:  Musculoskeletal: Normal range of motion. Extremities are nontender. No cyanosis, edema or clubbing noted Lymphadenopathy: No cervical, preauricular, postauricular or axillary adenopathy is present Skin: Skin is warm and dry. No rash noted. No diaphoresis. No erythema. No pallor. Pscyh: Normal mood and affect. Behavior is normal. Judgment and thought content normal.  LABORATORY RESULTS:  No results found for this or any previous visit (from the past 48 hour(s)).  RADIOLOGY RESULTS:  No results found.  Problem List:  Patient Active Problem List   Diagnosis   .  DYSLIPIDEMIA   .  ESSENTIAL HYPERTENSION, BENIGN   .  OSA (obstructive sleep apnea)   .  COPD, mild   .  Esophageal reflux    Assessment & Plan:  Small hiatal hernia with chronic cough and history of heartburn off PPI. Will proceed with proceed with Nissen fundoplication. Patient is aware of the risk and benefits of the procedure.  Matt B. Daphine Deutscher, MD, FACS  There has been no change in the patient's past medical history or physical exam in the past 24 hours to the best of my knowledge. I examined the patient in the holding area and have made any changes to the history and physical exam report that is included above.   Expectations and outcome results have been discussed with the patient to include risks and benefits.  All questions have been answered and we will proceed with previously discussed procedure noted and signed in the consent form in the patient's record.    Coleta Grosshans BMD FACS 8:15 AM  11/24/2011

## 2011-11-24 ENCOUNTER — Inpatient Hospital Stay (HOSPITAL_COMMUNITY)
Admission: RE | Admit: 2011-11-24 | Discharge: 2011-11-26 | DRG: 328 | Disposition: A | Discharge status: Home / Self Care | Payer: Medicare Other | Source: Ambulatory Visit | Attending: Surgery | Admitting: Surgery

## 2011-11-24 ENCOUNTER — Ambulatory Visit (HOSPITAL_COMMUNITY): Payer: Medicare Other | Admitting: Certified Registered Nurse Anesthetist

## 2011-11-24 ENCOUNTER — Encounter (HOSPITAL_COMMUNITY): Payer: Self-pay | Admitting: Certified Registered Nurse Anesthetist

## 2011-11-24 ENCOUNTER — Encounter (HOSPITAL_COMMUNITY): Payer: Self-pay | Admitting: *Deleted

## 2011-11-24 ENCOUNTER — Encounter (HOSPITAL_COMMUNITY)
Admission: RE | Disposition: A | Discharge status: Home / Self Care | Payer: Self-pay | Source: Ambulatory Visit | Attending: Surgery

## 2011-11-24 DIAGNOSIS — K2100 Gastro-esophageal reflux disease with esophagitis, without bleeding: Secondary | ICD-10-CM | POA: Diagnosis not present

## 2011-11-24 DIAGNOSIS — Z01812 Encounter for preprocedural laboratory examination: Secondary | ICD-10-CM | POA: Diagnosis not present

## 2011-11-24 DIAGNOSIS — Z9889 Other specified postprocedural states: Secondary | ICD-10-CM | POA: Diagnosis not present

## 2011-11-24 DIAGNOSIS — K219 Gastro-esophageal reflux disease without esophagitis: Secondary | ICD-10-CM | POA: Diagnosis not present

## 2011-11-24 DIAGNOSIS — K449 Diaphragmatic hernia without obstruction or gangrene: Secondary | ICD-10-CM | POA: Diagnosis not present

## 2011-11-24 DIAGNOSIS — G4733 Obstructive sleep apnea (adult) (pediatric): Secondary | ICD-10-CM | POA: Diagnosis present

## 2011-11-24 DIAGNOSIS — J4489 Other specified chronic obstructive pulmonary disease: Secondary | ICD-10-CM | POA: Diagnosis not present

## 2011-11-24 DIAGNOSIS — J449 Chronic obstructive pulmonary disease, unspecified: Secondary | ICD-10-CM | POA: Diagnosis not present

## 2011-11-24 HISTORY — PX: LAPAROSCOPIC NISSEN FUNDOPLICATION: SHX1932

## 2011-11-24 HISTORY — PX: HIATAL HERNIA REPAIR: SHX195

## 2011-11-24 LAB — CBC
MCH: 23.6 pg — ABNORMAL LOW (ref 26.0–34.0)
MCHC: 31.1 g/dL (ref 30.0–36.0)
MCV: 75.8 fL — ABNORMAL LOW (ref 78.0–100.0)
Platelets: 118 10*3/uL — ABNORMAL LOW (ref 150–400)
RBC: 5.05 MIL/uL (ref 4.22–5.81)

## 2011-11-24 LAB — CREATININE, SERUM: Creatinine, Ser: 1.02 mg/dL (ref 0.50–1.35)

## 2011-11-24 SURGERY — FUNDOPLICATION, NISSEN, LAPAROSCOPIC
Anesthesia: General | Wound class: Clean

## 2011-11-24 MED ORDER — LACTATED RINGERS IV SOLN
INTRAVENOUS | Status: DC | PRN
  Administered 2011-11-24: 08:00:00 via INTRAVENOUS

## 2011-11-24 MED ORDER — EPHEDRINE SULFATE 50 MG/ML IJ SOLN
INTRAMUSCULAR | Status: DC | PRN
  Administered 2011-11-24: 5 mg via INTRAVENOUS
  Administered 2011-11-24: 10 mg via INTRAVENOUS

## 2011-11-24 MED ORDER — HEPARIN SODIUM (PORCINE) 5000 UNIT/ML IJ SOLN
5000.0000 [IU] | Freq: Three times a day (TID) | INTRAMUSCULAR | Status: DC
  Administered 2011-11-24 – 2011-11-26 (×6): 5000 [IU] via SUBCUTANEOUS
  Filled 2011-11-24 (×9): qty 1

## 2011-11-24 MED ORDER — 0.9 % SODIUM CHLORIDE (POUR BTL) OPTIME
TOPICAL | Status: DC | PRN
  Administered 2011-11-24: 1000 mL

## 2011-11-24 MED ORDER — SUCCINYLCHOLINE CHLORIDE 20 MG/ML IJ SOLN
INTRAMUSCULAR | Status: DC | PRN
  Administered 2011-11-24: 100 mg via INTRAVENOUS

## 2011-11-24 MED ORDER — MIDAZOLAM HCL 5 MG/5ML IJ SOLN
INTRAMUSCULAR | Status: DC | PRN
  Administered 2011-11-24: 2 mg via INTRAVENOUS

## 2011-11-24 MED ORDER — LACTATED RINGERS IR SOLN
Status: DC | PRN
  Administered 2011-11-24: 1

## 2011-11-24 MED ORDER — ATROPINE SULFATE 0.4 MG/ML IJ SOLN
INTRAMUSCULAR | Status: DC | PRN
  Administered 2011-11-24: 0.4 mg via INTRAVENOUS

## 2011-11-24 MED ORDER — CEFOXITIN SODIUM 1 G IV SOLR
2.0000 g | INTRAVENOUS | Status: AC
  Administered 2011-11-24: 2 g via INTRAVENOUS

## 2011-11-24 MED ORDER — ACETAMINOPHEN 10 MG/ML IV SOLN
INTRAVENOUS | Status: DC | PRN
  Administered 2011-11-24: 1000 mg via INTRAVENOUS

## 2011-11-24 MED ORDER — HEPARIN SODIUM (PORCINE) 5000 UNIT/ML IJ SOLN
5000.0000 [IU] | Freq: Once | INTRAMUSCULAR | Status: AC
  Administered 2011-11-24: 5000 [IU] via SUBCUTANEOUS
  Filled 2011-11-24: qty 1

## 2011-11-24 MED ORDER — CEFOXITIN SODIUM-DEXTROSE 1-4 GM-% IV SOLR (PREMIX)
INTRAVENOUS | Status: AC
  Filled 2011-11-24: qty 100

## 2011-11-24 MED ORDER — HYDROMORPHONE HCL PF 1 MG/ML IJ SOLN
INTRAMUSCULAR | Status: AC
  Filled 2011-11-24: qty 1

## 2011-11-24 MED ORDER — ACETAMINOPHEN 10 MG/ML IV SOLN
INTRAVENOUS | Status: AC
  Filled 2011-11-24: qty 100

## 2011-11-24 MED ORDER — TISSEEL VH 10 ML EX KIT
PACK | CUTANEOUS | Status: AC
  Filled 2011-11-24: qty 1

## 2011-11-24 MED ORDER — DEXAMETHASONE SODIUM PHOSPHATE 10 MG/ML IJ SOLN
INTRAMUSCULAR | Status: DC | PRN
  Administered 2011-11-24: 10 mg via INTRAVENOUS

## 2011-11-24 MED ORDER — GLYCOPYRROLATE 0.2 MG/ML IJ SOLN
INTRAMUSCULAR | Status: DC | PRN
  Administered 2011-11-24: 0.6 mg via INTRAVENOUS

## 2011-11-24 MED ORDER — ONDANSETRON HCL 4 MG/2ML IJ SOLN: 4.0000 mg | Freq: Four times a day (QID) | INTRAMUSCULAR | Status: DC | PRN

## 2011-11-24 MED ORDER — PROPOFOL 10 MG/ML IV EMUL
INTRAVENOUS | Status: DC | PRN
  Administered 2011-11-24: 150 mg via INTRAVENOUS

## 2011-11-24 MED ORDER — HYDROMORPHONE HCL PF 1 MG/ML IJ SOLN
0.2500 mg | INTRAMUSCULAR | Status: DC | PRN
  Administered 2011-11-24 (×2): 0.5 mg via INTRAVENOUS

## 2011-11-24 MED ORDER — KCL IN DEXTROSE-NACL 20-5-0.45 MEQ/L-%-% IV SOLN
INTRAVENOUS | Status: DC
Start: 2011-11-24 — End: 2011-11-26
  Administered 2011-11-24: 21:00:00 via INTRAVENOUS
  Administered 2011-11-24: 1000 mL via INTRAVENOUS
  Administered 2011-11-25 – 2011-11-26 (×3): via INTRAVENOUS
  Filled 2011-11-24 (×6): qty 1000

## 2011-11-24 MED ORDER — NEOSTIGMINE METHYLSULFATE 1 MG/ML IJ SOLN
INTRAMUSCULAR | Status: DC | PRN
  Administered 2011-11-24: 5 mg via INTRAVENOUS

## 2011-11-24 MED ORDER — DIPHENHYDRAMINE HCL 12.5 MG/5ML PO ELIX: 12.5000 mg | ORAL_SOLUTION | Freq: Four times a day (QID) | ORAL | Status: DC | PRN

## 2011-11-24 MED ORDER — IPRATROPIUM-ALBUTEROL 20-100 MCG/ACT IN AERS
1.0000 | INHALATION_SPRAY | Freq: Four times a day (QID) | RESPIRATORY_TRACT | Status: DC | PRN
  Filled 2011-11-24: qty 4

## 2011-11-24 MED ORDER — TISSEEL VH 10 ML EX KIT
PACK | CUTANEOUS | Status: DC | PRN
  Administered 2011-11-24: 1

## 2011-11-24 MED ORDER — SCOPOLAMINE 1 MG/3DAYS TD PT72
MEDICATED_PATCH | TRANSDERMAL | Status: DC | PRN
  Administered 2011-11-24: 1 via TRANSDERMAL

## 2011-11-24 MED ORDER — BUPIVACAINE LIPOSOME 1.3 % IJ SUSP
20.0000 mL | INTRAMUSCULAR | Status: AC
  Administered 2011-11-24: 20 mL
  Filled 2011-11-24: qty 20

## 2011-11-24 MED ORDER — DIPHENHYDRAMINE HCL 50 MG/ML IJ SOLN: 12.5000 mg | Freq: Four times a day (QID) | INTRAMUSCULAR | Status: DC | PRN

## 2011-11-24 MED ORDER — SODIUM CHLORIDE 0.9 % IJ SOLN
INTRAMUSCULAR | Status: DC | PRN
  Administered 2011-11-24: 20 mL

## 2011-11-24 MED ORDER — FENTANYL CITRATE 0.05 MG/ML IJ SOLN
INTRAMUSCULAR | Status: DC | PRN
  Administered 2011-11-24 (×7): 50 ug via INTRAVENOUS

## 2011-11-24 MED ORDER — ONDANSETRON HCL 4 MG/2ML IJ SOLN
INTRAMUSCULAR | Status: DC | PRN
  Administered 2011-11-24: 4 mg via INTRAVENOUS

## 2011-11-24 MED ORDER — KCL IN DEXTROSE-NACL 20-5-0.45 MEQ/L-%-% IV SOLN
INTRAVENOUS | Status: AC
  Administered 2011-11-24: 1000 mL via INTRAVENOUS
  Filled 2011-11-24: qty 1000

## 2011-11-24 MED ORDER — SCOPOLAMINE 1 MG/3DAYS TD PT72
MEDICATED_PATCH | TRANSDERMAL | Status: AC
  Filled 2011-11-24: qty 1

## 2011-11-24 MED ORDER — ROCURONIUM BROMIDE 100 MG/10ML IV SOLN
INTRAVENOUS | Status: DC | PRN
  Administered 2011-11-24 (×3): 10 mg via INTRAVENOUS
  Administered 2011-11-24: 40 mg via INTRAVENOUS
  Administered 2011-11-24: 10 mg via INTRAVENOUS

## 2011-11-24 MED ORDER — HYDROMORPHONE HCL PF 1 MG/ML IJ SOLN
1.0000 mg | INTRAMUSCULAR | Status: DC | PRN
  Administered 2011-11-24 – 2011-11-26 (×6): 1 mg via INTRAVENOUS
  Filled 2011-11-24 (×6): qty 1

## 2011-11-24 MED ORDER — LIDOCAINE HCL (CARDIAC) 20 MG/ML IV SOLN
INTRAVENOUS | Status: DC | PRN
  Administered 2011-11-24: 100 mg via INTRAVENOUS

## 2011-11-24 MED ORDER — PROMETHAZINE HCL 25 MG/ML IJ SOLN: 6.2500 mg | INTRAMUSCULAR | Status: DC | PRN

## 2011-11-24 SURGICAL SUPPLY — 59 items
ADH SKN CLS APL DERMABOND .7 (GAUZE/BANDAGES/DRESSINGS) ×2
APL SKNCLS STERI-STRIP NONHPOA (GAUZE/BANDAGES/DRESSINGS) ×2
APPLIER CLIP ROT 10 11.4 M/L (STAPLE) ×3
APR CLP MED LRG 11.4X10 (STAPLE) ×2
BENZOIN TINCTURE PRP APPL 2/3 (GAUZE/BANDAGES/DRESSINGS) ×3 IMPLANT
CABLE HIGH FREQUENCY MONO STRZ (ELECTRODE) ×1 IMPLANT
CANISTER SUCTION 2500CC (MISCELLANEOUS) IMPLANT
CLAMP ENDO BABCK 10MM (STAPLE) IMPLANT
CLIP APPLIE ROT 10 11.4 M/L (STAPLE) IMPLANT
CLOTH BEACON ORANGE TIMEOUT ST (SAFETY) ×3 IMPLANT
COVER SURGICAL LIGHT HANDLE (MISCELLANEOUS) ×3 IMPLANT
DECANTER SPIKE VIAL GLASS SM (MISCELLANEOUS) ×3 IMPLANT
DERMABOND ADVANCED (GAUZE/BANDAGES/DRESSINGS) ×1
DERMABOND ADVANCED .7 DNX12 (GAUZE/BANDAGES/DRESSINGS) IMPLANT
DEVICE SUT QUICK LOAD TK 5 (STAPLE) ×4 IMPLANT
DEVICE SUT TI-KNOT TK 5X26 (MISCELLANEOUS) ×1 IMPLANT
DEVICE SUTURE ENDOST 10MM (ENDOMECHANICALS) ×3 IMPLANT
DISSECTOR BLUNT TIP ENDO 5MM (MISCELLANEOUS) ×3 IMPLANT
DRAIN PENROSE 18X1/2 LTX STRL (DRAIN) ×3 IMPLANT
DRAPE LAPAROSCOPIC ABDOMINAL (DRAPES) ×3 IMPLANT
ELECT REM PT RETURN 9FT ADLT (ELECTROSURGICAL) ×3
ELECTRODE REM PT RTRN 9FT ADLT (ELECTROSURGICAL) ×2 IMPLANT
FILTER SMOKE EVAC LAPAROSHD (FILTER) IMPLANT
GLOVE BIOGEL M 8.0 STRL (GLOVE) ×3 IMPLANT
GLOVE BIOGEL PI IND STRL 7.0 (GLOVE) IMPLANT
GLOVE BIOGEL PI INDICATOR 7.0 (GLOVE)
GOWN STRL NON-REIN LRG LVL3 (GOWN DISPOSABLE) ×3 IMPLANT
GOWN STRL REIN XL XLG (GOWN DISPOSABLE) ×7 IMPLANT
GRASPER ENDO BABCOCK 10 (MISCELLANEOUS) IMPLANT
GRASPER ENDO BABCOCK 10MM (MISCELLANEOUS) ×3
HAND ACTIVATED (MISCELLANEOUS) ×3 IMPLANT
KIT BASIN OR (CUSTOM PROCEDURE TRAY) ×3 IMPLANT
NS IRRIG 1000ML POUR BTL (IV SOLUTION) ×3 IMPLANT
PENCIL BUTTON HOLSTER BLD 10FT (ELECTRODE) IMPLANT
SCISSORS LAP 5X35 DISP (ENDOMECHANICALS) ×3 IMPLANT
SET IRRIG TUBING LAPAROSCOPIC (IRRIGATION / IRRIGATOR) ×3 IMPLANT
SLEEVE ADV FIXATION 5X100MM (TROCAR) IMPLANT
SLEEVE Z-THREAD 5X100MM (TROCAR) IMPLANT
SOLUTION ANTI FOG 6CC (MISCELLANEOUS) ×3 IMPLANT
STAPLER VISISTAT 35W (STAPLE) ×3 IMPLANT
STRIP CLOSURE SKIN 1/2X4 (GAUZE/BANDAGES/DRESSINGS) ×1 IMPLANT
SUT SURGIDAC NAB ES-9 0 48 120 (SUTURE) ×13 IMPLANT
SUT VIC AB 4-0 SH 18 (SUTURE) ×3 IMPLANT
SUT VICRYL ENDO 2-0 48IN (SUTURE) ×1 IMPLANT
SYR 30ML LL (SYRINGE) ×3 IMPLANT
TIP INNERVISION DETACH 40FR (MISCELLANEOUS) IMPLANT
TIP INNERVISION DETACH 50FR (MISCELLANEOUS) IMPLANT
TIP INNERVISION DETACH 56FR (MISCELLANEOUS) ×1 IMPLANT
TIPS INNERVISION DETACH 40FR (MISCELLANEOUS)
TRAY FOLEY CATH 14FRSI W/METER (CATHETERS) ×3 IMPLANT
TRAY LAP CHOLE (CUSTOM PROCEDURE TRAY) ×3 IMPLANT
TROCAR ADV FIXATION 11X100MM (TROCAR) ×1 IMPLANT
TROCAR ADV FIXATION 5X100MM (TROCAR) ×1 IMPLANT
TROCAR XCEL BLUNT TIP 100MML (ENDOMECHANICALS) IMPLANT
TROCAR XCEL NON-BLD 11X100MML (ENDOMECHANICALS) IMPLANT
TROCAR Z-THREAD FIOS 11X100 BL (TROCAR) ×3 IMPLANT
TROCAR Z-THREAD FIOS 5X100MM (TROCAR) ×4 IMPLANT
TROCAR Z-THREAD SLEEVE 11X100 (TROCAR) IMPLANT
TUBING FILTER THERMOFLATOR (ELECTROSURGICAL) ×3 IMPLANT

## 2011-11-24 NOTE — Progress Notes (Signed)
UR COMPLETED  

## 2011-11-24 NOTE — Anesthesia Postprocedure Evaluation (Signed)
  Anesthesia Post-op Note  Patient: Connor [name]  Procedure(s) Performed: Procedure(s) (LRB): LAPAROSCOPIC NISSEN FUNDOPLICATION (N/A) LAPAROSCOPIC REPAIR OF HIATAL HERNIA ()  Patient Location: PACU  Anesthesia Type: General  Level of Consciousness: awake and alert   Airway and Oxygen Therapy: Patient Spontanous Breathing  Post-op Pain: mild  Post-op Assessment: Post-op Vital signs reviewed, Patient's Cardiovascular Status Stable, Respiratory Function Stable, Patent Airway and No signs of Nausea or vomiting  Post-op Vital Signs: stable  Complications: No apparent anesthesia complications

## 2011-11-24 NOTE — Anesthesia Preprocedure Evaluation (Signed)
Anesthesia Evaluation  Patient identified by MRN, date of birth, ID band Patient awake    Reviewed: Allergy & Precautions, H&P , NPO status , Patient's Chart, lab work & pertinent test results  Airway Mallampati: II TM Distance: <3 FB Neck ROM: Full    Dental No notable dental hx.    Pulmonary sleep apnea , COPD breath sounds clear to auscultation  Pulmonary exam normal       Cardiovascular hypertension, Rhythm:Regular Rate:Normal     Neuro/Psych negative neurological ROS  negative psych ROS   GI/Hepatic Neg liver ROS, GERD-  Medicated,  Endo/Other  negative endocrine ROS  Renal/GU negative Renal ROS  negative genitourinary   Musculoskeletal negative musculoskeletal ROS (+)   Abdominal   Peds negative pediatric ROS (+)  Hematology negative hematology ROS (+)   Anesthesia Other Findings   Reproductive/Obstetrics negative OB ROS                           Anesthesia Physical Anesthesia Plan  ASA: III  Anesthesia Plan: General   Post-op Pain Management:    Induction: Intravenous and Rapid sequence  Airway Management Planned: Oral ETT  Additional Equipment:   Intra-op Plan:   Post-operative Plan: Extubation in OR  Informed Consent: I have reviewed the patients History and Physical, chart, labs and discussed the procedure including the risks, benefits and alternatives for the proposed anesthesia with the patient or authorized representative who has indicated his/her understanding and acceptance.   Dental advisory given  Plan Discussed with: CRNA and Surgeon  Anesthesia Plan Comments:         Anesthesia Quick Evaluation

## 2011-11-24 NOTE — Op Note (Signed)
Surgeon: Wenda Low, MD, FACS  Asst:  Marcille Blanco, MD, FACS  Anes:  Gen.  Procedure: Laparoscopic repair of hiatal hernia and Nissen fundoplication over a #56 bougie. Posterior hiatal repair with 2 sutures and a 3 suture wrap.  Diagnosis: Gastroesophageal reflux disease with small hiatal hernia  Complications: none  EBL:   7 cc  Description of Procedure:  The patient was taken to room 11 and given general anesthesia. The abdomen was prepped with PCMX and draped sterilely. A timeout was performed. The abdomen was then entered with a 0 5 mm Optiview technique into the left upper quadrant without difficulty. A camera port for 5 mm was placed on the left side and eventually another 5 mm was placed on the left side for assistant. A 5 mm was placed laterally on the right and 1011 was placed in the midline. The Nathanson retractor was inserted through an upper midline port and was affixed to the bed. The exposure the foregut was obtained. Dissection began in the pars flaccida encountering a fairly thickened vessel in this gastrohepatic membrane. I went ahead and clipped it and waited about 15 minutes and there was no demonstrable change in the perfusion of the liver. We then double clipped it and divided with harmonic scalpel. The phrenoesophageal ligaments were taken down anteriorly and posteriorly the right crus was dissected.  1 over took down short gastrics with the harmonic scalpel. In the lowermost 1 I oversewed will area where I was pretty close with harmonic scalpel but did not appear to really significantly injure the stomach. The vessels were divided up to the left crus which is subsequently dissected completely.  A Penrose drain was placed around the distal esophagus after the vagus nerve was identified and we stayed outside of the vagus nerve. A good to complete dissection occurred. Patient had a lot of inflammatory reaction and a lot of stringy adhesions from the hiatus to the esophagus.  This made this dissection more tedious. I did this with harmonic scalpel. Hemostasis was present. The stomach was then fully mobilized and could easily come around behind the esophagus without any traction or tension.  I closed the crura posteriorly with 2 simple simple sutures using the Endo Stitch securing the first with a timeout and the second with a free tie.  The 56 bougie was then passed to the transition point and with the bougie in place having completed the wrap with 3 sutures all secured with Ty knots. Tisseel was applied beneath the wrap. Wrap looked healthy. The area was inspected again hemostasis was present. The incision sites were injected with Exparel diluted to 40 cc. The 1011 had the fascia closed with a simple suture of 0 Vicryl and all wounds were closed with 4-0 Vicryl with Dermabond. Patient was taken recovery room in satisfactory condition.  Matt B. Daphine Deutscher, MD, [facility], [place] [phone]

## 2011-11-24 NOTE — Preoperative (Signed)
Beta Blockers   Reason not to administer Beta Blockers:Not Applicable 

## 2011-11-24 NOTE — Transfer of Care (Signed)
Immediate Anesthesia Transfer of Care Note  Patient: Connor [name]  Procedure(s) Performed: Procedure(s) (LRB): LAPAROSCOPIC NISSEN FUNDOPLICATION (N/A) LAPAROSCOPIC REPAIR OF HIATAL HERNIA ()  Patient Location: PACU  Anesthesia Type: General  Level of Consciousness: sedated, patient cooperative and responds to stimulaton  Airway & Oxygen Therapy: Patient Spontanous Breathing and Patient connected to face mask oxgen  Post-op Assessment: Report given to PACU RN and Post -op Vital signs reviewed and stable  Post vital signs: Reviewed and stable  Complications: No apparent anesthesia complications

## 2011-11-25 ENCOUNTER — Inpatient Hospital Stay (HOSPITAL_COMMUNITY): Payer: Medicare Other

## 2011-11-25 ENCOUNTER — Encounter (HOSPITAL_COMMUNITY): Payer: Self-pay | Admitting: Surgery

## 2011-11-25 DIAGNOSIS — Z9889 Other specified postprocedural states: Secondary | ICD-10-CM | POA: Diagnosis not present

## 2011-11-25 LAB — CBC WITH DIFFERENTIAL/PLATELET
Eosinophils Absolute: 0 10*3/uL (ref 0.0–0.7)
Eosinophils Relative: 0 % (ref 0–5)
HCT: 38.1 % — ABNORMAL LOW (ref 39.0–52.0)
Lymphocytes Relative: 7 % — ABNORMAL LOW (ref 12–46)
Lymphs Abs: 0.8 10*3/uL (ref 0.7–4.0)
MCH: 23.4 pg — ABNORMAL LOW (ref 26.0–34.0)
MCV: 75.4 fL — ABNORMAL LOW (ref 78.0–100.0)
Monocytes Absolute: 0.7 10*3/uL (ref 0.1–1.0)
Platelets: 125 10*3/uL — ABNORMAL LOW (ref 150–400)
RBC: 5.05 MIL/uL (ref 4.22–5.81)
WBC: 10.8 10*3/uL — ABNORMAL HIGH (ref 4.0–10.5)

## 2011-11-25 MED ORDER — HYDROCORTISONE 2.5 % RE CREA: 1.0000 "application " | TOPICAL_CREAM | Freq: Two times a day (BID) | RECTAL | Status: DC | PRN

## 2011-11-25 MED ORDER — ASPIRIN 81 MG PO TABS: 81.0000 mg | ORAL_TABLET | Freq: Every morning | ORAL | Status: DC

## 2011-11-25 MED ORDER — ALFUZOSIN HCL ER 10 MG PO TB24
10.0000 mg | ORAL_TABLET | Freq: Every day | ORAL | Status: DC
  Administered 2011-11-26: 10 mg via ORAL
  Filled 2011-11-25 (×2): qty 1

## 2011-11-25 MED ORDER — IOHEXOL 300 MG/ML  SOLN: 100.0000 mL | Freq: Once | INTRAMUSCULAR | Status: AC | PRN

## 2011-11-25 MED ORDER — ASPIRIN 81 MG PO CHEW
81.0000 mg | CHEWABLE_TABLET | Freq: Every day | ORAL | Status: DC
  Filled 2011-11-25 (×2): qty 1

## 2011-11-25 MED ORDER — ATORVASTATIN CALCIUM 40 MG PO TABS
40.0000 mg | ORAL_TABLET | Freq: Every day | ORAL | Status: DC
  Filled 2011-11-25 (×2): qty 1

## 2011-11-25 NOTE — Progress Notes (Addendum)
Patient ID: Connor [name], male   DOB: [birth year], 66 y.o.   MRN: [id number] [facility] Surgery Progress Note:   1 Day Post-Op  Subjective: Mental status is clear.  No complaints. Objective: Vital signs in last 24 hours: Temp:  [97.3 F (36.3 C)-98.5 F (36.9 C)] 98.5 F (36.9 C) (08/15 0558) Pulse Rate:  [64-90] 77  (08/15 0558) Resp:  [10-18] 18  (08/15 0558) BP: (127-164)/(66-80) 138/76 mmHg (08/15 0558) SpO2:  [92 %-100 %] 96 % (08/15 0558) Weight:  [231 lb 9.6 oz (105.053 kg)] 231 lb 9.6 oz (105.053 kg) (08/14 1324)  Intake/Output from previous day: 08/14 0701 - 08/15 0700 In: 4886.7 [I.V.:4886.7] Out: 2325 [Urine:2300; Blood:25] Intake/Output this shift:    Physical Exam: Work of breathing is  Normal.  Minimal incisional pain (Exparel).  Awaiting swallow.    Lab Results:  Results for orders placed during the hospital encounter of 11/24/11 (from the past 48 hour(s))  CBC     Status: Abnormal   Collection Time   11/24/11 11:25 AM      Component Value Range Comment   WBC 6.6  4.0 - 10.5 K/uL    RBC 5.05  4.22 - 5.81 MIL/uL    Hemoglobin 11.9 (*) 13.0 - 17.0 g/dL    HCT 40.9 (*) 81.1 - 52.0 %    MCV 75.8 (*) 78.0 - 100.0 fL    MCH 23.6 (*) 26.0 - 34.0 pg    MCHC 31.1  30.0 - 36.0 g/dL    RDW 91.4 (*) 78.2 - 15.5 %    Platelets 118 (*) 150 - 400 K/uL   CREATININE, SERUM     Status: Abnormal   Collection Time   11/24/11 11:25 AM      Component Value Range Comment   Creatinine, Ser 1.02  0.50 - 1.35 mg/dL    GFR calc non Af Amer 73 (*) >90 mL/min    GFR calc Af Amer 85 (*) >90 mL/min   CBC WITH DIFFERENTIAL     Status: Abnormal   Collection Time   11/25/11  3:30 AM      Component Value Range Comment   WBC 10.8 (*) 4.0 - 10.5 K/uL    RBC 5.05  4.22 - 5.81 MIL/uL    Hemoglobin 11.8 (*) 13.0 - 17.0 g/dL    HCT 95.6 (*) 21.3 - 52.0 %    MCV 75.4 (*) 78.0 - 100.0 fL    MCH 23.4 (*) 26.0 - 34.0 pg    MCHC 31.0  30.0 - 36.0 g/dL    RDW 08.6 (*) 57.8 - 15.5 %    Platelets 125 (*) 150 - 400 K/uL    Neutrophils Relative 87 (*) 43 - 77 %    Neutro Abs 9.4 (*) 1.7 - 7.7 K/uL    Lymphocytes Relative 7 (*) 12 - 46 %    Lymphs Abs 0.8  0.7 - 4.0 K/uL    Monocytes Relative 6  3 - 12 %    Monocytes Absolute 0.7  0.1 - 1.0 K/uL    Eosinophils Relative 0  0 - 5 %    Eosinophils Absolute 0.0  0.0 - 0.7 K/uL    Basophils Relative 0  0 - 1 %    Basophils Absolute 0.0  0.0 - 0.1 K/uL     Radiology/Results: No results found.  Anti-infectives: Anti-infectives     Start     Dose/Rate Route Frequency Ordered Stop   11/24/11 0625   cefOXitin (  MEFOXIN) 2 g in dextrose 5 % 50 mL IVPB        2 g 100 mL/hr over 30 Minutes Intravenous 60 min pre-op 11/24/11 1610 11/24/11 0837          Assessment/Plan: Problem List: Patient Active Problem List  Diagnosis  . DYSLIPIDEMIA  . ESSENTIAL HYPERTENSION, BENIGN  . OSA (obstructive sleep apnea)  . COPD, mild  . Esophageal reflux    If swallow is OK will start H20.  Would advance to non carbonated clear liquids tomorrow.   1 Day Post-Op    LOS: 1 day   Matt B. Daphine Deutscher, MD, [facility] [phone] beeper [phone]  11/25/2011 8:45 AM UGI looks OK.  Started clears and resumed some of her meds.

## 2011-11-25 NOTE — Progress Notes (Signed)
Set up patient on auto cpap (5-15) with his home mask, pt tolerating well at this time.

## 2011-11-25 NOTE — Progress Notes (Signed)
RT asked patient if he was ready for CPAP.  Patient said he would put on CPAP when he was ready for it.  RT checked unit and filled with sterile water.  RT told patient to call if he needed anything.

## 2011-11-26 DIAGNOSIS — Z9889 Other specified postprocedural states: Secondary | ICD-10-CM | POA: Diagnosis not present

## 2011-11-26 MED ORDER — OXYCODONE-ACETAMINOPHEN 5-325 MG/5ML PO SOLN: 5.0000 mL | ORAL | Status: DC | PRN

## 2011-11-26 NOTE — Discharge Summary (Signed)
Physician Discharge Summary  Patient ID: Connor [name] MRN: [id number] DOB/AGE: 07/27/1945 66 y.o.  Admit date: 11/24/2011 Discharge date: 11/26/2011  Admission Diagnoses: GERD  Discharge Diagnoses:  Same post lap Nissen fundoplication Active Problems:  Lap Nissen Fundoplication August 2013   Surgery:  Lap Nissen fundoplication  Discharged Condition: improved  Hospital Course:   Had laparoscopic Nissen fundoplication with 2 suture posterior closure of the hiatus and a 3 suture wrap over a #56 dilator.  UGI looked OK postop.  Taking clears OK  Consults: none  Significant Diagnostic Studies: UGI    Discharge Exam: Blood pressure 135/78, pulse 60, temperature 98.6 F (37 C), temperature source Oral, resp. rate 18, height 6' (1.829 m), weight 231 lb 9.6 oz (105.053 kg), SpO2 97.00%. Incisions OK.  No significant abdominal pain  Disposition: 01-Home or Self Care  Discharge Orders    Future Orders Please Complete By Expires   Increase activity slowly      Discharge instructions      Comments:   May shower Drive when not taking pain meds Do not play golf or lift heaving straining loads for 6 weeks Full liquid diet for 1 week then pureed diet for 4 weeks   Call MD for:      Scheduling Instructions:   Nausea and vomiting Temp above 101 orally Severe abdominal or chest pain     Medication List  As of 11/26/2011  7:03 AM   STOP taking these medications         esomeprazole 40 MG capsule      FOLTX PO         TAKE these medications         alfuzosin 10 MG 24 hr tablet   Commonly known as: UROXATRAL   Take 10 mg by mouth every morning.      aspirin 81 MG tablet   Take 81 mg by mouth every morning.      CENTRUM SILVER ULTRA MENS Tabs   Take 1 tablet by mouth. Once a day      CO Q 10 PO   Take 1 capsule by mouth daily.      fish oil-omega-3 fatty acids 1000 MG capsule   Take 2 g by mouth daily.      Ipratropium-Albuterol 20-100 MCG/ACT Aers respimat   Commonly known as: COMBIVENT   Inhale 1 puff into the lungs every 6 (six) hours as needed for wheezing or shortness of breath.      niacin 1000 MG CR tablet   Commonly known as: NIASPAN   Take 2,000 mg by mouth at bedtime.      OSTEO BI-FLEX ADV DOUBLE ST PO   Take 1 capsule by mouth daily.      oxyCODONE-acetaminophen 5-325 MG/5ML solution   Commonly known as: ROXICET   Take 5 mLs by mouth every 4 (four) hours as needed for pain.      PROCTOZONE-HC 2.5 % rectal cream   Generic drug: hydrocortisone   Place 1 application rectally 2 (two) times daily as needed. For hemorrhoids      pyridOXINE 100 MG tablet   Commonly known as: VITAMIN B-6   Take 100 mg by mouth daily.      rosuvastatin 20 MG tablet   Commonly known as: CRESTOR   Take 20 mg by mouth at bedtime.      vitamin B-12 500 MCG tablet   Commonly known as: CYANOCOBALAMIN   Take 500 mcg by mouth daily.  vitamin C 100 MG tablet   Take 100 mg by mouth daily.      Vitamin D-3 5000 UNITS Tabs   Take 1 tablet by mouth daily.           Follow-up Information    Follow up with Alexsa Flaum B, MD in 3 weeks.   Contact information:   [address] [place] [address] [phone]          Signed: Valarie Merino 11/26/2011, 7:03 AM

## 2011-12-15 ENCOUNTER — Ambulatory Visit (INDEPENDENT_AMBULATORY_CARE_PROVIDER_SITE_OTHER): Payer: Medicare Other | Admitting: Surgery

## 2011-12-15 ENCOUNTER — Encounter (INDEPENDENT_AMBULATORY_CARE_PROVIDER_SITE_OTHER): Payer: Self-pay | Admitting: Surgery

## 2011-12-15 VITALS — BP 106/62 | HR 76 | Temp 97.3°F | Resp 16 | Ht 71.5 in | Wt 216.2 lb

## 2011-12-15 DIAGNOSIS — Z09 Encounter for follow-up examination after completed treatment for conditions other than malignant neoplasm: Secondary | ICD-10-CM

## 2011-12-15 DIAGNOSIS — R7989 Other specified abnormal findings of blood chemistry: Secondary | ICD-10-CM | POA: Diagnosis not present

## 2011-12-15 DIAGNOSIS — Z9889 Other specified postprocedural states: Secondary | ICD-10-CM

## 2011-12-15 NOTE — Patient Instructions (Signed)
Thanks for your patience.  If you need further assistance after leaving the office, please call our office and speak with a [facility] nurse.  [phone].  If you want to leave a message for Dr. Ziah Turvey, please call his office phone at [phone]. 

## 2011-12-15 NOTE — Progress Notes (Signed)
Connor [name] 66 y.o.  Body mass index is 29.73 kg/(m^2).  Patient Active Problem List  Diagnosis  . DYSLIPIDEMIA  . ESSENTIAL HYPERTENSION, BENIGN  . OSA (obstructive sleep apnea)  . COPD, mild  . Esophageal reflux  . Lap Nissen Fundoplication August 2013    Allergies  Allergen Reactions  . Dexlansoprazole Diarrhea and Nausea Only    Past Surgical History  Procedure Date  . Appendectomy   . Knee surgery     right  . Shoulder surgery     left  . Nose surgery 2013  . Laparoscopic nissen fundoplication 11/24/2011    Procedure: LAPAROSCOPIC NISSEN FUNDOPLICATION;  Surgeon: Valarie Merino, MD;  Location: [facility]S;  Service: General;  Laterality: N/A;  . Hiatal hernia repair 11/24/2011    Procedure: LAPAROSCOPIC REPAIR OF HIATAL HERNIA;  Surgeon: Valarie Merino, MD;  Location: [facility];  Service: General;;   [name], MD No diagnosis found.  Three weeks post lap Nissen fundoplication.  Connor [name] come in today 3 weeks post lap Nissen. He is doing well. His incisions are healing fine. He has the expected degree of dysphasia secondary to edema. We discussed its management.  I told him to not play golf for 6 weeks post surgery and to gradually advance his diet 4-6 weeks out from surgery. I'll see him again in 2 months. Overall I think he is doing well Matt B. Daphine Deutscher, MD, [facility] [phone] beeper [phone]  12/15/2011 3:28 PM

## 2011-12-20 DIAGNOSIS — N486 Induration penis plastica: Secondary | ICD-10-CM | POA: Diagnosis not present

## 2011-12-20 DIAGNOSIS — N401 Enlarged prostate with lower urinary tract symptoms: Secondary | ICD-10-CM | POA: Diagnosis not present

## 2011-12-22 ENCOUNTER — Telehealth: Payer: Self-pay | Admitting: *Deleted

## 2011-12-22 DIAGNOSIS — R195 Other fecal abnormalities: Secondary | ICD-10-CM

## 2011-12-22 NOTE — Telephone Encounter (Signed)
Patient needs hemoccults mailed to him

## 2011-12-22 NOTE — Telephone Encounter (Signed)
Message copied by Connor [name] on Wed Dec 22, 2011  3:41 PM ------      Message from: Melvia Heaps D      Created: Tue Dec 14, 2011 11:26 AM       Patient needs Hemoccults after he is discharged from the hospital

## 2011-12-30 ENCOUNTER — Telehealth: Payer: Self-pay | Admitting: Pulmonary Disease

## 2011-12-30 DIAGNOSIS — G4733 Obstructive sleep apnea (adult) (pediatric): Secondary | ICD-10-CM

## 2011-12-30 NOTE — Telephone Encounter (Signed)
Pt is aware and needed nothing further 

## 2011-12-30 NOTE — Telephone Encounter (Signed)
Mailed Hemoccult cards to patient. With Instructions

## 2011-12-30 NOTE — Telephone Encounter (Signed)
Received request for new CPAP supplies.  I have placed order with [facility].    Will have my nurse inform pt that order has been placed with [facility].

## 2012-01-10 DIAGNOSIS — Z1212 Encounter for screening for malignant neoplasm of rectum: Secondary | ICD-10-CM | POA: Diagnosis not present

## 2012-01-26 DIAGNOSIS — E785 Hyperlipidemia, unspecified: Secondary | ICD-10-CM | POA: Diagnosis not present

## 2012-01-26 DIAGNOSIS — Z8249 Family history of ischemic heart disease and other diseases of the circulatory system: Secondary | ICD-10-CM | POA: Diagnosis not present

## 2012-01-26 DIAGNOSIS — R0609 Other forms of dyspnea: Secondary | ICD-10-CM | POA: Diagnosis not present

## 2012-01-26 DIAGNOSIS — K219 Gastro-esophageal reflux disease without esophagitis: Secondary | ICD-10-CM | POA: Diagnosis not present

## 2012-01-26 DIAGNOSIS — R0989 Other specified symptoms and signs involving the circulatory and respiratory systems: Secondary | ICD-10-CM | POA: Diagnosis not present

## 2012-01-31 DIAGNOSIS — L723 Sebaceous cyst: Secondary | ICD-10-CM | POA: Diagnosis not present

## 2012-01-31 DIAGNOSIS — D2370 Other benign neoplasm of skin of unspecified lower limb, including hip: Secondary | ICD-10-CM | POA: Diagnosis not present

## 2012-01-31 DIAGNOSIS — D239 Other benign neoplasm of skin, unspecified: Secondary | ICD-10-CM | POA: Diagnosis not present

## 2012-01-31 DIAGNOSIS — L819 Disorder of pigmentation, unspecified: Secondary | ICD-10-CM | POA: Diagnosis not present

## 2012-01-31 DIAGNOSIS — L570 Actinic keratosis: Secondary | ICD-10-CM | POA: Diagnosis not present

## 2012-01-31 DIAGNOSIS — Z85828 Personal history of other malignant neoplasm of skin: Secondary | ICD-10-CM | POA: Diagnosis not present

## 2012-01-31 DIAGNOSIS — L909 Atrophic disorder of skin, unspecified: Secondary | ICD-10-CM | POA: Diagnosis not present

## 2012-01-31 DIAGNOSIS — L821 Other seborrheic keratosis: Secondary | ICD-10-CM | POA: Diagnosis not present

## 2012-02-15 DIAGNOSIS — D649 Anemia, unspecified: Secondary | ICD-10-CM | POA: Diagnosis not present

## 2012-02-15 DIAGNOSIS — I2510 Atherosclerotic heart disease of native coronary artery without angina pectoris: Secondary | ICD-10-CM | POA: Diagnosis not present

## 2012-02-15 DIAGNOSIS — Z79899 Other long term (current) drug therapy: Secondary | ICD-10-CM | POA: Diagnosis not present

## 2012-02-15 DIAGNOSIS — R0602 Shortness of breath: Secondary | ICD-10-CM | POA: Diagnosis not present

## 2012-02-15 DIAGNOSIS — R5381 Other malaise: Secondary | ICD-10-CM | POA: Diagnosis not present

## 2012-02-15 DIAGNOSIS — R0989 Other specified symptoms and signs involving the circulatory and respiratory systems: Secondary | ICD-10-CM | POA: Diagnosis not present

## 2012-02-15 DIAGNOSIS — R079 Chest pain, unspecified: Secondary | ICD-10-CM | POA: Diagnosis not present

## 2012-02-15 DIAGNOSIS — E559 Vitamin D deficiency, unspecified: Secondary | ICD-10-CM | POA: Diagnosis not present

## 2012-02-15 DIAGNOSIS — Z9289 Personal history of other medical treatment: Secondary | ICD-10-CM

## 2012-02-15 DIAGNOSIS — R0609 Other forms of dyspnea: Secondary | ICD-10-CM | POA: Diagnosis not present

## 2012-02-15 DIAGNOSIS — E782 Mixed hyperlipidemia: Secondary | ICD-10-CM | POA: Diagnosis not present

## 2012-02-15 DIAGNOSIS — E785 Hyperlipidemia, unspecified: Secondary | ICD-10-CM | POA: Diagnosis not present

## 2012-02-15 DIAGNOSIS — R5383 Other fatigue: Secondary | ICD-10-CM | POA: Diagnosis not present

## 2012-02-15 HISTORY — DX: Personal history of other medical treatment: Z92.89

## 2012-02-17 ENCOUNTER — Ambulatory Visit (INDEPENDENT_AMBULATORY_CARE_PROVIDER_SITE_OTHER): Payer: Medicare Other | Admitting: Surgery

## 2012-02-17 ENCOUNTER — Encounter (INDEPENDENT_AMBULATORY_CARE_PROVIDER_SITE_OTHER): Payer: Self-pay | Admitting: Surgery

## 2012-02-17 VITALS — BP 162/76 | HR 84 | Temp 96.6°F | Resp 18 | Ht 71.5 in | Wt 221.6 lb

## 2012-02-17 DIAGNOSIS — Z09 Encounter for follow-up examination after completed treatment for conditions other than malignant neoplasm: Secondary | ICD-10-CM

## 2012-02-17 DIAGNOSIS — Z9889 Other specified postprocedural states: Secondary | ICD-10-CM

## 2012-02-17 NOTE — Patient Instructions (Signed)
Continue to eat slowly and deliberately

## 2012-02-17 NOTE — Progress Notes (Signed)
Connor [name] 66 y.o.  Body mass index is 30.48 kg/(m^2).  Patient Active Problem List  Diagnosis  . DYSLIPIDEMIA  . ESSENTIAL HYPERTENSION, BENIGN  . OSA (obstructive sleep apnea)  . COPD, mild  . Esophageal reflux  . Lap Nissen Fundoplication August 2013    Allergies  Allergen Reactions  . Dexlansoprazole Diarrhea and Nausea Only    Past Surgical History  Procedure Date  . Appendectomy   . Knee surgery     right  . Shoulder surgery     left  . Nose surgery 2013  . Laparoscopic nissen fundoplication 11/24/2011    Procedure: LAPAROSCOPIC NISSEN FUNDOPLICATION;  Surgeon: Valarie Merino, MD;  Location: [facility];  Service: General;  Laterality: N/A;  . Hiatal hernia repair 11/24/2011    Procedure: LAPAROSCOPIC REPAIR OF HIATAL HERNIA;  Surgeon: Valarie Merino, MD;  Location: [facility];  Service: General;;   [name], MD No diagnosis found.  Doing well.  Splitting wood and exercising.  Has had a few episodes of esophageal spasm with food sticking (pizza) that spontaneously cleared.  Otherwise doing well. Will see back in 6 months Matt B. Daphine Deutscher, MD, [facility] [phone] beeper [phone]  02/17/2012 9:57 AM

## 2012-02-21 DIAGNOSIS — E782 Mixed hyperlipidemia: Secondary | ICD-10-CM | POA: Diagnosis not present

## 2012-02-21 DIAGNOSIS — I119 Hypertensive heart disease without heart failure: Secondary | ICD-10-CM | POA: Diagnosis not present

## 2012-02-23 ENCOUNTER — Encounter: Payer: Self-pay | Admitting: Gastroenterology

## 2012-02-23 NOTE — Progress Notes (Signed)
Patient ID: Connor [name], male   DOB: 11-Jul-1945, 66 y.o.   MRN: [id number] Stool Hemoccult negative on January 11, 2012

## 2012-03-16 DIAGNOSIS — N400 Enlarged prostate without lower urinary tract symptoms: Secondary | ICD-10-CM | POA: Diagnosis not present

## 2012-03-16 DIAGNOSIS — K219 Gastro-esophageal reflux disease without esophagitis: Secondary | ICD-10-CM | POA: Diagnosis not present

## 2012-04-10 DIAGNOSIS — M6240 Contracture of muscle, unspecified site: Secondary | ICD-10-CM | POA: Diagnosis not present

## 2012-04-10 DIAGNOSIS — M898X9 Other specified disorders of bone, unspecified site: Secondary | ICD-10-CM | POA: Diagnosis not present

## 2012-04-10 DIAGNOSIS — M722 Plantar fascial fibromatosis: Secondary | ICD-10-CM | POA: Diagnosis not present

## 2012-04-11 DIAGNOSIS — R7989 Other specified abnormal findings of blood chemistry: Secondary | ICD-10-CM | POA: Diagnosis not present

## 2012-04-11 DIAGNOSIS — Z23 Encounter for immunization: Secondary | ICD-10-CM | POA: Diagnosis not present

## 2012-04-13 DIAGNOSIS — M898X9 Other specified disorders of bone, unspecified site: Secondary | ICD-10-CM | POA: Diagnosis not present

## 2012-04-14 ENCOUNTER — Telehealth: Payer: Self-pay | Admitting: Hematology & Oncology

## 2012-04-14 NOTE — Telephone Encounter (Signed)
Received labs from Dr Tresa Endo pt had requested they fax them to [place]. I called Pt his wife is a pt of ours and said pt wanted to see Dr. Myna Hidalgo due to abnormal blood work. Pt aware of 05-04-12 appointment

## 2012-04-17 DIAGNOSIS — M722 Plantar fascial fibromatosis: Secondary | ICD-10-CM | POA: Diagnosis not present

## 2012-04-17 DIAGNOSIS — M898X9 Other specified disorders of bone, unspecified site: Secondary | ICD-10-CM | POA: Diagnosis not present

## 2012-04-19 DIAGNOSIS — M898X9 Other specified disorders of bone, unspecified site: Secondary | ICD-10-CM | POA: Diagnosis not present

## 2012-04-19 DIAGNOSIS — D649 Anemia, unspecified: Secondary | ICD-10-CM | POA: Diagnosis not present

## 2012-04-21 DIAGNOSIS — M898X9 Other specified disorders of bone, unspecified site: Secondary | ICD-10-CM | POA: Diagnosis not present

## 2012-04-24 DIAGNOSIS — M898X9 Other specified disorders of bone, unspecified site: Secondary | ICD-10-CM | POA: Diagnosis not present

## 2012-04-26 DIAGNOSIS — M898X9 Other specified disorders of bone, unspecified site: Secondary | ICD-10-CM | POA: Diagnosis not present

## 2012-04-28 DIAGNOSIS — M898X9 Other specified disorders of bone, unspecified site: Secondary | ICD-10-CM | POA: Diagnosis not present

## 2012-04-28 DIAGNOSIS — Z4889 Encounter for other specified surgical aftercare: Secondary | ICD-10-CM | POA: Diagnosis not present

## 2012-04-28 DIAGNOSIS — Z1212 Encounter for screening for malignant neoplasm of rectum: Secondary | ICD-10-CM | POA: Diagnosis not present

## 2012-05-04 ENCOUNTER — Ambulatory Visit (HOSPITAL_BASED_OUTPATIENT_CLINIC_OR_DEPARTMENT_OTHER): Payer: Medicare Other | Admitting: Hematology & Oncology

## 2012-05-04 ENCOUNTER — Ambulatory Visit: Payer: Medicare Other

## 2012-05-04 ENCOUNTER — Other Ambulatory Visit (HOSPITAL_BASED_OUTPATIENT_CLINIC_OR_DEPARTMENT_OTHER): Payer: Medicare Other | Admitting: Lab

## 2012-05-04 VITALS — BP 129/66 | HR 77 | Temp 97.5°F | Resp 18 | Ht 73.0 in | Wt 231.0 lb

## 2012-05-04 DIAGNOSIS — R161 Splenomegaly, not elsewhere classified: Secondary | ICD-10-CM

## 2012-05-04 DIAGNOSIS — D696 Thrombocytopenia, unspecified: Secondary | ICD-10-CM | POA: Diagnosis not present

## 2012-05-04 DIAGNOSIS — D45 Polycythemia vera: Secondary | ICD-10-CM

## 2012-05-04 DIAGNOSIS — D649 Anemia, unspecified: Secondary | ICD-10-CM

## 2012-05-04 LAB — CBC WITH DIFFERENTIAL (CANCER CENTER ONLY)
EOS%: 2.4 % (ref 0.0–7.0)
LYMPH#: 0.9 10*3/uL (ref 0.9–3.3)
MCH: 26.7 pg — ABNORMAL LOW (ref 28.0–33.4)
MCHC: 32.8 g/dL (ref 32.0–35.9)
MONO%: 5.1 % (ref 0.0–13.0)
NEUT#: 3.2 10*3/uL (ref 1.5–6.5)
Platelets: 110 10*3/uL — ABNORMAL LOW (ref 145–400)
RBC: 5.66 10*6/uL (ref 4.20–5.70)

## 2012-05-04 LAB — CHCC SATELLITE - SMEAR

## 2012-05-04 NOTE — Progress Notes (Signed)
This office note has been dictated.

## 2012-05-05 ENCOUNTER — Other Ambulatory Visit: Payer: Self-pay | Admitting: Lab

## 2012-05-05 DIAGNOSIS — D45 Polycythemia vera: Secondary | ICD-10-CM

## 2012-05-05 DIAGNOSIS — R161 Splenomegaly, not elsewhere classified: Secondary | ICD-10-CM

## 2012-05-05 LAB — RETICULOCYTES (CHCC)
ABS Retic: 51.8 10*3/uL (ref 19.0–186.0)
RBC.: 5.75 MIL/uL (ref 4.22–5.81)
Retic Ct Pct: 0.9 % (ref 0.4–2.3)

## 2012-05-05 LAB — COMPREHENSIVE METABOLIC PANEL
ALT: 19 U/L (ref 0–53)
AST: 22 U/L (ref 0–37)
Alkaline Phosphatase: 66 U/L (ref 39–117)
Potassium: 3.8 mEq/L (ref 3.5–5.3)
Sodium: 142 mEq/L (ref 135–145)
Total Bilirubin: 0.7 mg/dL (ref 0.3–1.2)
Total Protein: 6 g/dL (ref 6.0–8.3)

## 2012-05-05 LAB — IRON AND TIBC
%SAT: 75 % — ABNORMAL HIGH (ref 20–55)
TIBC: 341 ug/dL (ref 215–435)

## 2012-05-05 NOTE — Progress Notes (Signed)
CC:   Ernestina Penna, [name]  DIAGNOSES: 1. Thrombocytopenia. 2. Iron deficiency.  HISTORY OF PRESENT ILLNESS:  Mr. Schreier is a real nice 67 year old white gentleman.  He is the husband of my patient, Keiyon Plack.  He is followed by Dr. Vernon Prey.  Apparently he wanted to see a cardiologist.  He has had no heart issues.  There have been some heart issues in the family.  He had some blood work done.  The blood work seemed to show that he was iron deficient.  Of note, going back to September of this year, his labs showed a white cell count of 4.7, hemoglobin 12.6, hematocrit 42 and platelet count was 206.  His MCV was 75.  He had iron studies done.  Apparently, his ferritin was low.  I think his ferritin was 6.  He had stool studies done which were negative.  He then had lab work done in July.  His white cell count was 4.1, hemoglobin 13.5, hematocrit 41.2 and platelet count was 136.  MCV was 76.  I think he may have been put on some iron supplements at the time.  He had a metabolic panel done.  This showed basically normal hepatic and renal studies.  Apparently he did have a hiatal hernia repair.  I think this was in August.  This went well.  Again, because of the thrombocytopenia, it was felt that he needed a hematologic evaluation.  He had lab work done in November.  His platelet count was 148. Hemoglobin was 12.7.  MCV was 74.  He had normal TSH.  He had iron studies repeated in November.  His ferritin was 5.  Iron saturation was 8 percent.  His total iron was 35.  He had been on aspirin.  This was stopped because he was told that his platelets were too low and that he would bleed.  He has had no bleeding.  There has been no bruising.  He has had no weight loss or weight gain.  He has not started any kind of new medications.  He does have occasional headaches.  He has had no pruritus.  He does have reflux.  PAST MEDICAL HISTORY:  Remarkable for: 1.  Hyperlipidemia. 2. Hypertension. 3. Obstructive sleep apnea. 4. Reflux. 5. COPD.  ALLERGIES:  Dexilant.  MEDICATIONS: 1. Uroxatral 10 mg daily. 2. AndroGel daily (the patient does not do every daily). 3. Aspirin 81 mg p.o. daily. 4. Combivent 1 puff q.6 hours p.r.n. 5. Crestor 20 mg p.o. daily. 6. Vitamin B12 500 mg p.o. daily. 7. Iron daily.  SOCIAL HISTORY:  Remarkable for I think past tobacco use.  There is rare alcohol use.  No obvious occupational exposures.  He is retired.  FAMILY HISTORY:  Noncontributory for any obvious blood disorder.  REVIEW OF SYSTEMS:  As stated in history of present illness.  No additional findings are noted on a 12 system review.  PHYSICAL EXAMINATION:  General:  This is a well-developed, well- nourished white gentleman in no obvious distress.  He does have a "ruddy" complexion.  Vital signs:  Show a temperature of 97.5, pulse 72, respiratory rate 16, blood pressure 129/66.  Weight is 231.  Head and neck:  Shows a normocephalic, atraumatic skull.  He does have some facial plethora.  He has some slight conjunctival inflammation.  There are no oral lesions.  There is no adenopathy in his neck.  Lungs:  Clear bilaterally.  There are no rales, wheezes or rhonchi.  Cardiac:  Regular rate and rhythm with a normal S1, S2.  There are no murmurs, rubs or bruits.  Abdomen:  Soft with good bowel sounds.  There is no fluid wave. There is no palpable hepatomegaly.  His spleen tip appears be palpable with inspiration.  Back:  No tenderness over the spine, ribs or hips. Extremities:  Show no clubbing, cyanosis or edema.  He has good range of motion of his joints.  Skin:  Does show this ruddy complexion. Neurological:  Shows no focal neurological deficits.  LABORATORY STUDIES:  White cell count is 4.5, hemoglobin 15, hematocrit 46, platelet count 110.  MCV is 81.  Peripheral smear shows a normochromic, normocytic population of red blood cells.  There are  no nucleated red blood cells.  There are no teardrop cells.  I see no schistocytes.  There are no target cells.  His white cells appear normal in morphology/maturation.  There are no hypersegmented polys.  I see no immature myeloid or lymphoid forms.  There are no blasts.  Platelets are somewhat decreased in number.  Platelets are well-granulated.  There may be a couple of large platelets.  IMPRESSION:  Mr. Krisko is a 67 year old gentleman.  He has mild thrombocytopenia.  It is hard to say what is the source of this.  Just 3- 4 months ago his platelet count was okay.  I do not see any medicines that he is on that could be considered the etiology.  His spleen is enlarged by my exam.  As such, he may have some splenic sequestration.  I told him to get back on his aspirin as his platelet count is not low enough that it would cause any problems for him.  I am more interested in the fact that he is iron deficient but yet not anemic.  I just wonder if he may not have polycythemia.  I do note that he is on androgen replacement therapy.  This could be the source for his normal blood count.  He also has sleep apnea.  I am not sure how diligent he is in using his CPAP.  I suppose that if he does have clinically significant sleep apnea, this also could affect his hemoglobin. I am checking a JAK2 assay on him.  I just want to make sure he does not have polycythemia vera.  This would be 1 etiology to consider with someone who is not anemic but yet markedly iron deficient.  I am checking an erythropoietin level on him which also can help with respect to his polycythemia.  We are going to get a CT scan of his abdomen and pelvis.  I want to make sure that we follow up on the spleen to see if there is splenomegaly.  Ultimately, he may need to have a bone marrow biopsy done.  This, however, will be the test of last resort.  I spent a good hour or more with Mr. Lafosse.  It was good to see him  and his wife again.  I will plan to get Mr. Latorre back once we get the results back from all of our studies.    ______________________________ Josph Macho, [name] PRE/MEDQ  D:  05/04/2012  T:  05/05/2012  Job:  [id number]

## 2012-05-05 NOTE — Addendum Note (Signed)
Addended by: Arlan Organ R on: 05/05/2012 08:03 AM   Modules accepted: Orders

## 2012-05-10 ENCOUNTER — Encounter (HOSPITAL_BASED_OUTPATIENT_CLINIC_OR_DEPARTMENT_OTHER): Payer: Self-pay

## 2012-05-10 ENCOUNTER — Ambulatory Visit (HOSPITAL_BASED_OUTPATIENT_CLINIC_OR_DEPARTMENT_OTHER)
Admission: RE | Admit: 2012-05-10 | Discharge: 2012-05-10 | Disposition: A | Discharge status: Home / Self Care | Payer: Medicare Other | Source: Ambulatory Visit | Attending: Hematology & Oncology | Admitting: Hematology & Oncology

## 2012-05-10 DIAGNOSIS — N201 Calculus of ureter: Secondary | ICD-10-CM | POA: Diagnosis not present

## 2012-05-10 DIAGNOSIS — R161 Splenomegaly, not elsewhere classified: Secondary | ICD-10-CM | POA: Insufficient documentation

## 2012-05-10 DIAGNOSIS — D696 Thrombocytopenia, unspecified: Secondary | ICD-10-CM | POA: Diagnosis not present

## 2012-05-10 DIAGNOSIS — D45 Polycythemia vera: Secondary | ICD-10-CM

## 2012-05-10 DIAGNOSIS — D649 Anemia, unspecified: Secondary | ICD-10-CM

## 2012-05-10 DIAGNOSIS — N1330 Unspecified hydronephrosis: Secondary | ICD-10-CM | POA: Diagnosis not present

## 2012-05-10 MED ORDER — IOHEXOL 300 MG/ML  SOLN
100.0000 mL | Freq: Once | INTRAMUSCULAR | Status: AC | PRN
  Administered 2012-05-10: 100 mL via INTRAVENOUS

## 2012-05-11 ENCOUNTER — Other Ambulatory Visit: Payer: Self-pay | Admitting: Hematology & Oncology

## 2012-05-11 DIAGNOSIS — D696 Thrombocytopenia, unspecified: Secondary | ICD-10-CM

## 2012-05-11 DIAGNOSIS — D509 Iron deficiency anemia, unspecified: Secondary | ICD-10-CM

## 2012-05-12 ENCOUNTER — Telehealth: Payer: Self-pay | Admitting: Hematology & Oncology

## 2012-05-12 ENCOUNTER — Telehealth: Payer: Self-pay | Admitting: Gastroenterology

## 2012-05-12 NOTE — Telephone Encounter (Signed)
Wife states that the pt had some heme + stools and that Dr. Myna Hidalgo wanted him to have a colon done in Feb. Dr. Arlyce Dice please advise regarding a colon. Wife aware that Dr. Arlyce Dice is out of town and returns 05/22/12.

## 2012-05-12 NOTE — Telephone Encounter (Signed)
Left pt message with 3-28 appointment °

## 2012-05-17 DIAGNOSIS — Z85828 Personal history of other malignant neoplasm of skin: Secondary | ICD-10-CM | POA: Diagnosis not present

## 2012-05-18 ENCOUNTER — Telehealth: Payer: Self-pay | Admitting: *Deleted

## 2012-05-21 ENCOUNTER — Encounter: Payer: Self-pay | Admitting: Family Medicine

## 2012-05-21 DIAGNOSIS — N400 Enlarged prostate without lower urinary tract symptoms: Secondary | ICD-10-CM | POA: Insufficient documentation

## 2012-05-21 DIAGNOSIS — I6529 Occlusion and stenosis of unspecified carotid artery: Secondary | ICD-10-CM

## 2012-05-21 DIAGNOSIS — K5730 Diverticulosis of large intestine without perforation or abscess without bleeding: Secondary | ICD-10-CM

## 2012-05-21 DIAGNOSIS — Z8639 Personal history of other endocrine, nutritional and metabolic disease: Secondary | ICD-10-CM | POA: Insufficient documentation

## 2012-05-21 DIAGNOSIS — I517 Cardiomegaly: Secondary | ICD-10-CM | POA: Insufficient documentation

## 2012-05-21 DIAGNOSIS — D696 Thrombocytopenia, unspecified: Secondary | ICD-10-CM | POA: Insufficient documentation

## 2012-05-21 NOTE — Telephone Encounter (Signed)
Ok for [employer]

## 2012-05-22 NOTE — Telephone Encounter (Signed)
Pt scheduled for previsit 05/26/12@4pm , Colon scheduled with Dr. Arlyce Dice 06/02/12@3pm . Spoke with wife and she is aware of appt dates and times.

## 2012-05-24 ENCOUNTER — Ambulatory Visit (AMBULATORY_SURGERY_CENTER): Payer: Medicare Other

## 2012-05-24 VITALS — Ht 71.5 in | Wt 231.2 lb

## 2012-05-24 DIAGNOSIS — R195 Other fecal abnormalities: Secondary | ICD-10-CM

## 2012-05-24 MED ORDER — NA SULFATE-K SULFATE-MG SULF 17.5-3.13-1.6 GM/177ML PO SOLN: 1.0000 | Freq: Once | ORAL | Status: DC

## 2012-06-02 ENCOUNTER — Encounter: Payer: Self-pay | Admitting: Gastroenterology

## 2012-06-02 ENCOUNTER — Ambulatory Visit (AMBULATORY_SURGERY_CENTER): Payer: Medicare Other | Admitting: Gastroenterology

## 2012-06-02 VITALS — BP 112/87 | HR 67 | Temp 97.1°F | Resp 20 | Ht 71.5 in | Wt 231.0 lb

## 2012-06-02 DIAGNOSIS — R195 Other fecal abnormalities: Secondary | ICD-10-CM

## 2012-06-02 DIAGNOSIS — Z1211 Encounter for screening for malignant neoplasm of colon: Secondary | ICD-10-CM | POA: Diagnosis not present

## 2012-06-02 DIAGNOSIS — I517 Cardiomegaly: Secondary | ICD-10-CM | POA: Diagnosis not present

## 2012-06-02 DIAGNOSIS — G4733 Obstructive sleep apnea (adult) (pediatric): Secondary | ICD-10-CM | POA: Diagnosis not present

## 2012-06-02 MED ORDER — SODIUM CHLORIDE 0.9 % IV SOLN: 500.0000 mL | INTRAVENOUS | Status: DC

## 2012-06-02 NOTE — Progress Notes (Signed)
Patient did not experience any of the following events: a burn prior to discharge; a fall within the facility; wrong site/side/patient/procedure/implant event; or a hospital transfer or hospital admission upon discharge from the facility. (G8907) Patient did not have preoperative order for IV antibiotic SSI prophylaxis. (G8918)  

## 2012-06-02 NOTE — Patient Instructions (Addendum)
Discharge instructions given with verbal understanding. Handouts on diverticulosis and a high fiber diet given. Resume previous medications. Hemoccults cards given in recovery. YOU HAD AN ENDOSCOPIC PROCEDURE TODAY AT THE [facility]: Refer to the procedure report that was given to you for any specific questions about what was found during the examination.  If the procedure report does not answer your questions, please call your gastroenterologist to clarify.  If you requested that your care partner not be given the details of your procedure findings, then the procedure report has been included in a sealed envelope for you to review at your convenience later.  YOU SHOULD EXPECT: Some feelings of bloating in the abdomen. Passage of more gas than usual.  Walking can help get rid of the air that was put into your GI tract during the procedure and reduce the bloating. If you had a lower endoscopy (such as a colonoscopy or flexible sigmoidoscopy) you may notice spotting of blood in your stool or on the toilet paper. If you underwent a bowel prep for your procedure, then you may not have a normal bowel movement for a few days.  DIET: Your first meal following the procedure should be a light meal and then it is ok to progress to your normal diet.  A half-sandwich or bowl of soup is an example of a good first meal.  Heavy or fried foods are harder to digest and may make you feel nauseous or bloated.  Likewise meals heavy in dairy and vegetables can cause extra gas to form and this can also increase the bloating.  Drink plenty of fluids but you should avoid alcoholic beverages for 24 hours.  ACTIVITY: Your care partner should take you home directly after the procedure.  You should plan to take it easy, moving slowly for the rest of the day.  You can resume normal activity the day after the procedure however you should NOT DRIVE or use heavy machinery for 24 hours (because of the sedation medicines used  during the test).    SYMPTOMS TO REPORT IMMEDIATELY: A gastroenterologist can be reached at any hour.  During normal business hours, 8:30 AM to 5:00 PM Monday through Friday, call [phone].  After hours and on weekends, please call the GI answering service at [phone] who will take a message and have the physician on call contact you.   Following lower endoscopy (colonoscopy or flexible sigmoidoscopy):  Excessive amounts of blood in the stool  Significant tenderness or worsening of abdominal pains  Swelling of the abdomen that is new, acute  Fever of 100F or higher  FOLLOW UP: If any biopsies were taken you will be contacted by phone or by letter within the next 1-3 weeks.  Call your gastroenterologist if you have not heard about the biopsies in 3 weeks.  Our staff will call the home number listed on your records the next business day following your procedure to check on you and address any questions or concerns that you may have at that time regarding the information given to you following your procedure. This is a courtesy call and so if there is no answer at the home number and we have not heard from you through the emergency physician on call, we will assume that you have returned to your regular daily activities without incident.  SIGNATURES/CONFIDENTIALITY: You and/or your care partner have signed paperwork which will be entered into your electronic medical record.  These signatures attest to the fact that  that the information above on your After Visit Summary has been reviewed and is understood.  Full responsibility of the confidentiality of this discharge information lies with you and/or your care-partner.

## 2012-06-02 NOTE — Op Note (Signed)
[facility] [address] [place], [address]   COLONOSCOPY PROCEDURE REPORT  PATIENT: Connor [name], Connor [name]  MR#: [id number] BIRTHDATE: [birth year] , 66  yrs. old GENDER: Male ENDOSCOPIST: Louis Meckel, MD REFERRED GE:[id number] Christell Constant, [name] PROCEDURE DATE:  06/02/2012 PROCEDURE:   Colonoscopy, diagnostic ASA CLASS:   Class II INDICATIONS:heme-positive stool. MEDICATIONS: MAC sedation, administered by CRNA and propofol (Diprivan) 300mg  IV  DESCRIPTION OF PROCEDURE:   After the risks benefits and alternatives of the procedure were thoroughly explained, informed consent was obtained.  A digital rectal exam revealed no abnormalities of the rectum.   The LB [id number]  endoscope was introduced through the anus and advanced to the cecum, which was identified by both the appendix and ileocecal valve. No adverse events experienced.   The quality of the prep was Suprep excellent The instrument was then slowly withdrawn as the colon was fully examined.      COLON FINDINGS: Hemorrhoids were found.   Mild diverticulosis was noted in the sigmoid colon.   The colon mucosa was otherwise normal.  Retroflexed views revealed no abnormalities. The time to cecum=2 minutes 22 seconds.  Withdrawal time=7 minutes 40 seconds. The scope was withdrawn and the procedure completed. COMPLICATIONS: There were no complications.  ENDOSCOPIC IMPRESSION: 1..   Mild diverticulosis was noted in the sigmoid colon 2.   The colon mucosa was otherwise normal  RECOMMENDATIONS: Followup hemeoccults 5 days colonoscopy 10 years   eSigned:  Louis Meckel, MD 06/02/2012 3:25 PM   cc:

## 2012-06-05 ENCOUNTER — Telehealth: Payer: Self-pay | Admitting: *Deleted

## 2012-06-05 NOTE — Telephone Encounter (Signed)
  Follow up Call-  Call back number 06/02/2012  Post procedure Call Back phone  # [phone]  Permission to leave phone message Yes      Patient questions:  Do you have a fever, pain , or abdominal swelling? no Pain Score  0 *  Have you tolerated food without any problems? yes  Have you been able to return to your normal activities? yes  Do you have any questions about your discharge instructions: Diet   no Medications  no Follow up visit  no  Do you have questions or concerns about your Care? no  Actions: * If pain score is 4 or above: No action needed, pain <4.

## 2012-06-06 ENCOUNTER — Other Ambulatory Visit: Payer: Self-pay | Admitting: Gastroenterology

## 2012-06-06 DIAGNOSIS — D649 Anemia, unspecified: Secondary | ICD-10-CM

## 2012-06-12 DIAGNOSIS — N401 Enlarged prostate with lower urinary tract symptoms: Secondary | ICD-10-CM | POA: Diagnosis not present

## 2012-06-19 DIAGNOSIS — N529 Male erectile dysfunction, unspecified: Secondary | ICD-10-CM | POA: Diagnosis not present

## 2012-06-19 DIAGNOSIS — N401 Enlarged prostate with lower urinary tract symptoms: Secondary | ICD-10-CM | POA: Diagnosis not present

## 2012-06-29 ENCOUNTER — Ambulatory Visit: Payer: Self-pay | Admitting: Family Medicine

## 2012-07-07 ENCOUNTER — Other Ambulatory Visit: Payer: Medicare Other | Admitting: Lab

## 2012-07-07 ENCOUNTER — Ambulatory Visit: Payer: Medicare Other | Admitting: Hematology & Oncology

## 2012-07-07 ENCOUNTER — Ambulatory Visit (HOSPITAL_BASED_OUTPATIENT_CLINIC_OR_DEPARTMENT_OTHER): Payer: Medicare Other | Admitting: Medical

## 2012-07-07 ENCOUNTER — Other Ambulatory Visit (HOSPITAL_BASED_OUTPATIENT_CLINIC_OR_DEPARTMENT_OTHER): Payer: Medicare Other | Admitting: Lab

## 2012-07-07 VITALS — BP 125/72 | HR 74 | Temp 97.8°F | Resp 18 | Ht 73.0 in | Wt 228.0 lb

## 2012-07-07 DIAGNOSIS — R161 Splenomegaly, not elsewhere classified: Secondary | ICD-10-CM | POA: Diagnosis not present

## 2012-07-07 DIAGNOSIS — D649 Anemia, unspecified: Secondary | ICD-10-CM | POA: Diagnosis not present

## 2012-07-07 DIAGNOSIS — D696 Thrombocytopenia, unspecified: Secondary | ICD-10-CM

## 2012-07-07 LAB — CBC WITH DIFFERENTIAL (CANCER CENTER ONLY)
BASO#: 0 10*3/uL (ref 0.0–0.2)
BASO%: 0.5 % (ref 0.0–2.0)
HCT: 48 % (ref 38.7–49.9)
HGB: 16.4 g/dL (ref 13.0–17.1)
LYMPH#: 1 10*3/uL (ref 0.9–3.3)
LYMPH%: 16.5 % (ref 14.0–48.0)
MCHC: 34.2 g/dL (ref 32.0–35.9)
MCV: 84 fL (ref 82–98)
MONO#: 0.4 10*3/uL (ref 0.1–0.9)
NEUT%: 73.9 % (ref 40.0–80.0)
RDW: 14.9 % (ref 11.1–15.7)
WBC: 5.8 10*3/uL (ref 4.0–10.0)

## 2012-07-07 LAB — FERRITIN: Ferritin: 18 ng/mL — ABNORMAL LOW (ref 22–322)

## 2012-07-07 LAB — IRON AND TIBC
%SAT: 45 % (ref 20–55)
TIBC: 365 ug/dL (ref 215–435)
UIBC: 200 ug/dL (ref 125–400)

## 2012-07-07 LAB — LACTATE DEHYDROGENASE: LDH: 103 U/L (ref 94–250)

## 2012-07-07 NOTE — Progress Notes (Signed)
DIAGNOSES: 1. Thrombocytopenia. 2. Iron deficiency.  Current therapy: Observation  Interim history: This is a pleasant, gentleman, who, we saw for the first time.  As a new patient.  Back in January.  He was referred to [place] for thrombocytopenia.  He, also has iron deficiency anemia.  It was noted on his physical exam, that he had an enlarged spleen, as, such, we wanted to go ahead and get a CT scan on him.  There is also question whether he may have polycythemia vera, or not.  We did send off a JAK2 assay.  His CT scan of the abdomen, and pelvis revealed a spleen, at the upper limits of normal size, measuring 3.3 cm.  He, also had a 5 mm, mid left ureteral calculus with mild left hydronephrosis.  There were no other abnormalities noted.  His JAK 2, came back negative.  He, also had a colonoscopy, done back on February, 21st, for heme positive stool.  The colonoscopy, revealed diverticulosis in the sigmoid colon.  There were no other abnormalities.  His erythropoietin level was 8.5, his ferritin level was 13, his iron was 255 with 75% saturation.  Of note, he had been on AndroGel, and reports, that he stopped AndroGel at the end of January.  He does report, that he has noticed more fatigue, and headache since he stopped, the AndroGel.  I did advise him to followup with his primary care physician regarding this issue.  He states he is on by mouth iron supplementation, as he is iron deficient.  His platelet count has decreased from 110,000, now down to 73,000.  He's not reporting any obvious, bleeding.  He's not reporting any nose bleeding gum bleeding, any hemoptysis, or hematochezia.  He's not reporting any unintentional weight, loss.  He's not reporting a palpable, glands.  He does not report any recent, chronic or recurrent infections.  He does report, that after he takes his shower.  He does itch for about 20 minutes.  He, otherwise, reports, a decent, appetite.  He denies any nausea, vomiting, diarrhea,  constipation, any chest pain, shortness of breath, or cough.  He denies any fevers, chills, or night sweats.  He denies any swollen glands.  He denies any abdominal pain.  He denies any obvious, or abnormal bleeding.  He denies any lower leg swelling.  He denies any visual changes, or rashes.  Of note, he states, that he is on Crestor.  Sometimes, we see thrombocytopenia, and patient's on Crestor.  Review of Systems: Constitutional:Negative for malaise/fatigue, fever, chills, weight loss, diaphoresis, activity change, appetite change, and unexpected weight change.  HEENT: Negative for double vision, blurred vision, visual loss, ear pain, tinnitus, congestion, rhinorrhea, epistaxis sore throat or sinus disease, oral pain/lesion, tongue soreness Respiratory: Negative for cough, chest tightness, shortness of breath, wheezing and stridor.  Cardiovascular: Negative for chest pain, palpitations, leg swelling, orthopnea, PND, DOE or claudication Gastrointestinal: Negative for nausea, vomiting, abdominal pain, diarrhea, constipation, blood in stool, melena, hematochezia, abdominal distention, anal bleeding, rectal pain, anorexia and hematemesis.  Genitourinary: Negative for dysuria, frequency, hematuria,  Musculoskeletal: Negative for myalgias, back pain, joint swelling, arthralgias and gait problem.  Skin: Negative for rash, color change, pallor and wound.  Neurological:. Negative for dizziness/light-headedness, tremors, seizures, syncope, facial asymmetry, speech difficulty, weakness, numbness, headaches and paresthesias.  Hematological: Negative for adenopathy. Does not bruise/bleed easily.  Psychiatric/Behavioral:  Negative for depression, no loss of interest in normal activity or change in sleep pattern.   Physical Exam: This is  a pleasant, 67 year old, well-developed, well-nourished, white gentleman, in no obvious distress Vitals: Temperature 97.8 degrees, pulse 74, respirations 18, blood pressure  125/72.  Weight 228 pounds HEENT reveals a normocephalic, atraumatic skull, no scleral icterus, no oral lesions  Neck is supple without any cervical or supraclavicular adenopathy.  Lungs are clear to auscultation bilaterally. There are no wheezes, rales or rhonci Cardiac is regular rate and rhythm with a normal S1 and S2. There are no murmurs, rubs, or bruits.  Abdomen is soft with good bowel sounds, there is no palpable mass. There is no palpable hepatosplenomegaly. There is no palpable fluid wave.  Musculoskeletal no tenderness of the spine, ribs, or hips.  Extremities there are no clubbing, cyanosis, or edema.  Skin no petechia, purpura or ecchymosis Neurologic is nonfocal.  Laboratory Data: White count 5.8, hemoglobin 16.4, hematocrit 40.0, MCV 84, platelets 73,000  Current Outpatient Prescriptions on File Prior to Visit  Medication Sig Dispense Refill  . alfuzosin (UROXATRAL) 10 MG 24 hr tablet Take 10 mg by mouth every morning.       . ANDROGEL PUMP 12.5 MG/ACT (1%) GEL by Does not apply route every morning.       . Ascorbic Acid (VITAMIN C) 100 MG tablet Take 100 mg by mouth daily.       [name] aspirin 81 MG tablet Take 81 mg by mouth every morning.       . Cholecalciferol (VITAMIN D-3) 5000 UNITS TABS Take 1 tablet by mouth daily.       . Coenzyme Q10 (CO Q 10 PO) Take 1 capsule by mouth daily.       . fish oil-omega-3 fatty acids 1000 MG capsule Take 2 g by mouth daily.       . FOLBIC 2.5-25-2 MG TABS       . Glucosamine-Chondroit-Vit C-Mn (GLUCOSAMINE CHONDR 1500 COMPLX) CAPS Take by mouth every morning.      . Ipratropium-Albuterol (COMBIVENT RESPIMAT) 20-100 MCG/ACT AERS respimat Inhale 1 puff into the lungs every 6 (six) hours as needed for wheezing or shortness of breath.  1 Inhaler  5  . IRON, FERROUS GLUCONATE, PO Take by mouth every morning.      . Multiple Vitamins-Minerals (CENTRUM SILVER ULTRA MENS) TABS Take 1 tablet by mouth. Once a day      . pyridOXINE (VITAMIN B-6) 100  MG tablet Take 100 mg by mouth daily.      . rosuvastatin (CRESTOR) 20 MG tablet Take 20 mg by mouth at bedtime.       . vitamin B-12 (CYANOCOBALAMIN) 500 MCG tablet Take 500 mcg by mouth daily.       No current facility-administered medications on file prior to visit.   Assessment/Plan: This is a pleasant, 67 year old, white gentleman, with the following issues:  #1.  Thrombocytopenia.  His platelet count has dropped since the last, time, we saw him.  His spleen is not significantly enlarged by CT scan.  His JAK 2 assay was negative.  His colonoscopy was negative except for some diverticulosis in the sigmoid colon.  It is hard to say what his thrombocytopenia is coming from. He is on Crestor.  He is not symptomatic.  He's not reporting any active bleeding.  I did discuss this with Dr. Myna Hidalgo 2.  At this point, would like to hold off on a bone marrow, biopsy and have Mr. Vanatta return in 2 months for lab work and office followup visit.  I did inform  Mr. Schlabach, in the  interim, should he have any active bleeding, or any type of ecchymosis, or petechiae, to call our office.  #2.  Followup.  We will follow back up with Mr. Mccollam, in 2 months, but before then should there be questions or concerns.  The above assessment and plan was collaborated with Dr. Myna Hidalgo who is in agreement.

## 2012-07-10 DIAGNOSIS — M722 Plantar fascial fibromatosis: Secondary | ICD-10-CM | POA: Diagnosis not present

## 2012-07-17 ENCOUNTER — Other Ambulatory Visit: Payer: Medicare Other

## 2012-07-17 DIAGNOSIS — R195 Other fecal abnormalities: Secondary | ICD-10-CM

## 2012-07-17 LAB — HEMOCCULT SLIDES (X 3 CARDS)
OCCULT 1: NEGATIVE
OCCULT 4: NEGATIVE

## 2012-07-19 ENCOUNTER — Other Ambulatory Visit: Payer: Self-pay | Admitting: Hematology & Oncology

## 2012-07-19 ENCOUNTER — Telehealth: Payer: Self-pay | Admitting: Hematology & Oncology

## 2012-07-19 DIAGNOSIS — D693 Immune thrombocytopenic purpura: Secondary | ICD-10-CM

## 2012-07-19 NOTE — Telephone Encounter (Signed)
Central scheduling has order for [facility] and has left message for pt to call and schedule appointment

## 2012-07-20 ENCOUNTER — Encounter (HOSPITAL_COMMUNITY): Payer: Self-pay | Admitting: Pharmacy Technician

## 2012-07-20 ENCOUNTER — Other Ambulatory Visit (INDEPENDENT_AMBULATORY_CARE_PROVIDER_SITE_OTHER): Payer: Medicare Other

## 2012-07-20 DIAGNOSIS — I10 Essential (primary) hypertension: Secondary | ICD-10-CM

## 2012-07-20 DIAGNOSIS — R5381 Other malaise: Secondary | ICD-10-CM | POA: Diagnosis not present

## 2012-07-20 DIAGNOSIS — E785 Hyperlipidemia, unspecified: Secondary | ICD-10-CM | POA: Diagnosis not present

## 2012-07-20 DIAGNOSIS — E039 Hypothyroidism, unspecified: Secondary | ICD-10-CM

## 2012-07-20 DIAGNOSIS — R5383 Other fatigue: Secondary | ICD-10-CM | POA: Diagnosis not present

## 2012-07-20 DIAGNOSIS — Z79899 Other long term (current) drug therapy: Secondary | ICD-10-CM

## 2012-07-20 DIAGNOSIS — E559 Vitamin D deficiency, unspecified: Secondary | ICD-10-CM

## 2012-07-20 LAB — POCT CBC
Granulocyte percent: 75.3 %G (ref 37–80)
Lymph, poc: 1.2 (ref 0.6–3.4)
MCV: 86.2 fL (ref 80–97)
MPV: 9.5 fL (ref 0–99.8)
POC Granulocyte: 4.5 (ref 2–6.9)
POC LYMPH PERCENT: 20.4 %L (ref 10–50)
Platelet Count, POC: 125 10*3/uL — AB (ref 142–424)
RBC: 5.8 M/uL (ref 4.69–6.13)
RDW, POC: 14.9 %
WBC: 6 10*3/uL (ref 4.6–10.2)

## 2012-07-20 LAB — HEPATIC FUNCTION PANEL
ALT: 19 U/L (ref 0–53)
AST: 19 U/L (ref 0–37)
Bilirubin, Direct: 0.2 mg/dL (ref 0.0–0.3)
Indirect Bilirubin: 0.6 mg/dL (ref 0.0–0.9)
Total Protein: 6.4 g/dL (ref 6.0–8.3)

## 2012-07-20 LAB — THYROID PANEL WITH TSH
Free Thyroxine Index: 2.2 (ref 1.0–3.9)
T3 Uptake: 32 % (ref 22.5–37.0)
T4, Total: 7 ug/dL (ref 5.0–12.5)

## 2012-07-20 LAB — BASIC METABOLIC PANEL
BUN: 17 mg/dL (ref 6–23)
Chloride: 107 mEq/L (ref 96–112)
Potassium: 4.3 mEq/L (ref 3.5–5.3)
Sodium: 143 mEq/L (ref 135–145)

## 2012-07-21 LAB — NMR LIPOPROFILE WITH LIPIDS
Cholesterol, Total: 150 mg/dL (ref <200)
LDL (calc): 93 mg/dL (ref <100)
LDL Particle Number: 1571 nmol/L — ABNORMAL HIGH (ref <1000)
LDL Size: 19.7 nm — ABNORMAL LOW (ref >20.5)
Large VLDL-P: <0.8 nmol/L (ref <=2.7)
Small LDL Particle Number: 1165 nmol/L — ABNORMAL HIGH (ref <=527)
VLDL Size: 46.6 nm (ref <=46.6)

## 2012-07-22 NOTE — Progress Notes (Signed)
Quick Note:  Please inform the patient that lab work was normal and to continue current plan of action ______ 

## 2012-07-24 ENCOUNTER — Telehealth: Payer: Self-pay | Admitting: *Deleted

## 2012-07-24 ENCOUNTER — Telehealth: Payer: Self-pay | Admitting: Family Medicine

## 2012-07-24 NOTE — Telephone Encounter (Signed)
Message copied by Daphine Deutscher on Mon Jul 24, 2012  8:57 AM ------      Message from: Melvia Heaps D      Created: Sat Jul 22, 2012  7:51 PM       Please inform the patient that lab work was normal and to continue current plan of action ------

## 2012-07-24 NOTE — Telephone Encounter (Signed)
Patient given results and recommendation. 

## 2012-07-25 ENCOUNTER — Other Ambulatory Visit: Payer: Self-pay | Admitting: Radiology

## 2012-07-25 NOTE — Telephone Encounter (Signed)
Lab to address

## 2012-07-25 NOTE — Telephone Encounter (Signed)
labs that were done last week 07-20-2012 were  faxed to Dr. Otilio Connors office

## 2012-07-28 ENCOUNTER — Encounter (HOSPITAL_COMMUNITY): Payer: Self-pay

## 2012-07-28 ENCOUNTER — Ambulatory Visit (HOSPITAL_COMMUNITY)
Admission: RE | Admit: 2012-07-28 | Discharge: 2012-07-28 | Disposition: A | Discharge status: Home / Self Care | Payer: Medicare Other | Source: Ambulatory Visit | Attending: Hematology & Oncology | Admitting: Hematology & Oncology

## 2012-07-28 ENCOUNTER — Ambulatory Visit (HOSPITAL_COMMUNITY): Admission: RE | Admit: 2012-07-28 | Payer: Medicare Other | Source: Ambulatory Visit

## 2012-07-28 DIAGNOSIS — D693 Immune thrombocytopenic purpura: Secondary | ICD-10-CM

## 2012-07-28 DIAGNOSIS — D696 Thrombocytopenia, unspecified: Secondary | ICD-10-CM | POA: Diagnosis not present

## 2012-07-28 LAB — PROTIME-INR
INR: 1.02 (ref 0.00–1.49)
Prothrombin Time: 13.3 seconds (ref 11.6–15.2)

## 2012-07-28 LAB — BONE MARROW EXAM: Bone Marrow Exam: 254

## 2012-07-28 LAB — CBC
Platelets: 124 10*3/uL — ABNORMAL LOW (ref 150–400)
RDW: 14.1 % (ref 11.5–15.5)
WBC: 4.9 10*3/uL (ref 4.0–10.5)

## 2012-07-28 LAB — APTT: aPTT: 27 seconds (ref 24–37)

## 2012-07-28 MED ORDER — SODIUM CHLORIDE 0.9 % IV SOLN
Freq: Once | INTRAVENOUS | Status: AC
  Administered 2012-07-28: 20 mL/h via INTRAVENOUS

## 2012-07-28 MED ORDER — FENTANYL CITRATE 0.05 MG/ML IJ SOLN
INTRAMUSCULAR | Status: AC
  Filled 2012-07-28: qty 6

## 2012-07-28 MED ORDER — FENTANYL CITRATE 0.05 MG/ML IJ SOLN
INTRAMUSCULAR | Status: AC | PRN
  Administered 2012-07-28: 50 ug via INTRAVENOUS
  Administered 2012-07-28: 100 ug via INTRAVENOUS

## 2012-07-28 MED ORDER — MIDAZOLAM HCL 2 MG/2ML IJ SOLN
INTRAMUSCULAR | Status: AC
  Filled 2012-07-28: qty 6

## 2012-07-28 MED ORDER — MIDAZOLAM HCL 2 MG/2ML IJ SOLN
INTRAMUSCULAR | Status: AC | PRN
  Administered 2012-07-28: 1 mg via INTRAVENOUS
  Administered 2012-07-28: 0.5 mg via INTRAVENOUS
  Administered 2012-07-28: 1 mg via INTRAVENOUS

## 2012-07-28 NOTE — H&P (Signed)
Chief Complaint: "I'm here for bone marrow biopsy" Referring Physician:Ennever HPI: Connor [name] is an 67 y.o. male with ongoing workup for thrombocytopenia. He is referred for BM biopsy. PMHx and meds reviewed. Pt feels well this morning. Has been NPO  Past Medical History:  Past Medical History  Diagnosis Date  . Hyperlipemia   . Nephrolithiasis   . Carotid artery stenosis   . Cough 05/26/2007  . COPD, mild 05/26/2007    PFT 09/09/11>>FEV1 3.06 (95%), FEV1% 69, FEF 25-75% 1.70 (59%), TLC 7.67 (109%), DLCO 108%, no BD    . GERD (gastroesophageal reflux disease)   . Shortness of breath     with exertion   . Sleep apnea     Cpap-   . Cancer     squamous cell on nose , inside nose   . Erectile dysfunction   . Peyronie's disease   . Anemia   . BPH (benign prostatic hyperplasia)   . Cardiomegaly   . H/O vitamin D deficiency   . Thrombocytopenia     Past Surgical History:  Past Surgical History  Procedure Laterality Date  . Appendectomy    . Knee surgery      right  . Shoulder surgery      left  . Nose surgery  2013  . Laparoscopic nissen fundoplication  11/24/2011    Procedure: LAPAROSCOPIC NISSEN FUNDOPLICATION;  Surgeon: Valarie Merino, MD;  Location: [facility];  Service: General;  Laterality: N/A;  . Hiatal hernia repair  11/24/2011    Procedure: LAPAROSCOPIC REPAIR OF HIATAL HERNIA;  Surgeon: Valarie Merino, MD;  Location: [facility];  Service: General;;    Family History:  Family History  Problem Relation Age of Onset  . Heart disease Mother   . Diabetes Mother   . Heart disease Father   . Heart disease Sister   . Heart disease Brother   . Diabetes Brother   . Colon cancer Neg Hx   . Esophageal cancer Neg Hx   . Rectal cancer Neg Hx   . Stomach cancer Neg Hx   . Liver cancer Paternal Grandfather   . Throat cancer Maternal Grandfather     Social History:  reports that he quit smoking about 46 years ago. His smoking use included Cigarettes. He smoked 0.00  packs per day for 11 years. He has never used smokeless tobacco. He reports that  drinks alcohol. He reports that he does not use illicit drugs.  Allergies:  Allergies  Allergen Reactions  . Dexlansoprazole Diarrhea and Nausea Only    Medications: alfuzosin (UROXATRAL) 10 MG 24 hr tablet (Taking) Sig - Route: Take 10 mg by mouth every morning. - Oral Class: Historical Med Number of times this order has been changed since signing: 2 Order [address] aspirin 81 MG tablet (Taking) Sig - Route: Take 81 mg by mouth every morning. - Oral Class: Historical Med Number of times this order has been changed since signing: 2 Order [address] Cholecalciferol (VITAMIN D-3) 5000 UNITS TABS (Taking) Sig - Route: Take 1 tablet by mouth daily. - Oral Class: Historical Med Number of times this order has been changed since signing: 2 Order [address] Coenzyme Q10 (CO Q 10 PO) (Taking) Sig - Route: Take 1 capsule by mouth daily. - Oral Class: Historical Med Number of times this order has been changed since signing: 2 Order [address] fish oil-omega-3 fatty acids 1000 MG capsule (Taking) Sig - Route: Take 2 g by mouth daily. -  Oral Class: Historical Med Number of times this order has been changed since signing: 2 Order [address] FOLBIC 2.5-25-2 MG TABS (Taking) 03/01/2012 Sig - Route: Take 1 tablet by mouth daily. - Oral Class: Historical Med Number of times this order has been changed since signing: 3 Order [address] Glucosamine-Chondroit-Vit C-Mn (GLUCOSAMINE CHONDR 1500 COMPLX) CAPS (Taking) Sig - Route: Take by mouth every morning. - Oral Class: Historical Med Number of times this order has been changed since signing: 1 Order [address] Ipratropium-Albuterol (COMBIVENT RESPIMAT) 20-100 MCG/ACT AERS respimat (Taking) 1 Inhaler 5 09/15/2011 Sig - Route: Inhale 1 puff into the lungs every 6 (six) hours as needed for wheezing or shortness of breath. - Inhalation Number of times this order has been changed since signing: 1  Order [address] IRON, FERROUS GLUCONATE, PO (Taking) Sig - Route: Take by mouth every morning. - Oral Class: Historical Med Number of times this order has been changed since signing: 1 Order [address] Multiple Vitamins-Minerals (CENTRUM SILVER ULTRA MENS) TABS (Taking) Sig - Route: Take 1 tablet by mouth. Once a day - Oral Class: Historical Med Number of times this order has been changed since signing: 2 Order [address] pyridOXINE (VITAMIN B-6) 100 MG tablet (Taking) Sig - Route: Take 100 mg by mouth daily. - Oral Class: Historical Med Number of times this order has been changed since signing: 1 Order [address] rosuvastatin (CRESTOR) 20 MG tablet (Taking) Sig - Route: Take 20 mg by mouth at bedtime. - Oral Class: Historical Med Number of times this order has been changed since signing: 3 Order [address] ANDROGEL PUMP 12.5 MG/ACT (1%) GEL 03/27/2012 Sig - Route: Apply 4 Act topically every morning. 2 pumps on each shoulder - Apply externally Class: Historical Med    Please HPI for pertinent positives, otherwise complete 10 system ROS negative.  Physical Exam: Blood pressure 137/79, pulse 72, temperature 97.8 F (36.6 C), temperature source Oral, resp. rate 20, height 5' 11.5" (1.816 m), weight 228 lb (103.42 kg), SpO2 98.00%. Body mass index is 31.36 kg/(m^2).   General Appearance:  Alert, cooperative, no distress, appears stated age  Head:  Normocephalic, without obvious abnormality, atraumatic  ENT: Unremarkable  Neck: Supple, symmetrical, trachea midline  Lungs:   Clear to auscultation bilaterally, no w/r/r, respirations unlabored without use of accessory muscles.  Chest Wall:  No tenderness or deformity  Heart:  Regular rate and rhythm, S1, S2 normal, no murmur, rub or gallop.   Neurologic: Normal affect, no gross deficits.   CBC    Component Value Date/Time   WBC 4.9 07/28/2012 0725   RBC 5.52 07/28/2012 0725   HGB 16.2 07/28/2012 0725   HCT 47.0 07/28/2012 0725   PLT 124*  07/28/2012 0725   Prothrombin Time/INR 1.02   Assessment/Plan Thrombocytopenia For CT guided Bone marrow biopsy. Discussed procedure, risks, complications. Labs reviewed Consent signed in chart  Brayton El PA-C 07/28/2012, 8:23 AM

## 2012-07-28 NOTE — H&P (Signed)
Agree 

## 2012-07-28 NOTE — Procedures (Signed)
Procedure:  CT guided bone marrow biopsy Findings:  Aspirate and core biopsy of right iliac bone marrow. No complications. 

## 2012-07-31 ENCOUNTER — Other Ambulatory Visit: Payer: Self-pay | Admitting: Hematology & Oncology

## 2012-07-31 ENCOUNTER — Ambulatory Visit: Payer: Self-pay | Admitting: Family Medicine

## 2012-08-01 ENCOUNTER — Telehealth: Payer: Self-pay | Admitting: Hematology & Oncology

## 2012-08-01 NOTE — Telephone Encounter (Signed)
Pt aware of 4-24 iron appointment

## 2012-08-03 ENCOUNTER — Ambulatory Visit (HOSPITAL_BASED_OUTPATIENT_CLINIC_OR_DEPARTMENT_OTHER): Payer: Medicare Other

## 2012-08-03 VITALS — BP 141/75 | HR 74 | Temp 97.4°F

## 2012-08-03 DIAGNOSIS — D649 Anemia, unspecified: Secondary | ICD-10-CM

## 2012-08-03 DIAGNOSIS — D696 Thrombocytopenia, unspecified: Secondary | ICD-10-CM

## 2012-08-03 MED ORDER — SODIUM CHLORIDE 0.9 % IV SOLN
1020.0000 mg | Freq: Once | INTRAVENOUS | Status: AC
  Administered 2012-08-03: 1020 mg via INTRAVENOUS
  Filled 2012-08-03: qty 34

## 2012-08-03 MED ORDER — SODIUM CHLORIDE 0.9 % IV SOLN
Freq: Once | INTRAVENOUS | Status: AC
  Administered 2012-08-03: 14:00:00 via INTRAVENOUS

## 2012-08-03 NOTE — Patient Instructions (Addendum)
Ferumoxytol injection What is this medicine? FERUMOXYTOL is an iron complex. Iron is used to make healthy red blood cells, which carry oxygen and nutrients throughout the body. This medicine is used to treat iron deficiency anemia in people with chronic kidney disease. This medicine may be used for other purposes; ask your health care provider or pharmacist if you have questions. What should I tell my health care provider before I take this medicine? They need to know if you have any of these conditions: -anemia not caused by low iron levels -high levels of iron in the blood -magnetic resonance imaging (MRI) test scheduled -an unusual or allergic reaction to iron, other medicines, foods, dyes, or preservatives -pregnant or trying to get pregnant -breast-feeding How should I use this medicine? This medicine is for infusion into a vein. It is given by a health care professional in a hospital or clinic setting. Talk to your pediatrician regarding the use of this medicine in children. Special care may be needed. Overdosage: If you think you've taken too much of this medicine contact a poison control center or emergency room at once. Overdosage: If you think you have taken too much of this medicine contact a poison control center or emergency room at once. NOTE: This medicine is only for you. Do not share this medicine with others. What if I miss a dose? It is important not to miss your dose. Call your doctor or health care professional if you are unable to keep an appointment. What may interact with this medicine? This medicine may interact with the following medications: -other iron products This list may not describe all possible interactions. Give your health care provider a list of all the medicines, herbs, non-prescription drugs, or dietary supplements you use. Also tell them if you smoke, drink alcohol, or use illegal drugs. Some items may interact with your medicine. What should I watch  for while using this medicine? Visit your doctor or healthcare professional regularly. Tell your doctor or healthcare professional if your symptoms do not start to get better or if they get worse. You may need blood work done while you are taking this medicine. You may need to follow a special diet. Talk to your doctor. Foods that contain iron include: whole grains/cereals, dried fruits, beans, or peas, leafy green vegetables, and organ meats (liver, kidney). What side effects may I notice from receiving this medicine? Side effects that you should report to your doctor or health care professional as soon as possible: -allergic reactions like skin rash, itching or hives, swelling of the face, lips, or tongue -breathing problems -changes in blood pressure -feeling faint or lightheaded, falls -fever or chills -flushing, sweating, or hot feelings -swelling of the ankles or feet Side effects that usually do not require medical attention (Report these to your doctor or health care professional if they continue or are bothersome.): -diarrhea -headache -nausea, vomiting -stomach pain This list may not describe all possible side effects. Call your doctor for medical advice about side effects. You may report side effects to FDA at 1-800-FDA-1088. Where should I keep my medicine? This drug is given in a hospital or clinic and will not be stored at home. NOTE: This sheet is a summary. It may not cover all possible information. If you have questions about this medicine, talk to your doctor, pharmacist, or health care provider.  2012, [employer]vier/Gold Standard. (12/20/2007 9:48:25 PM) 

## 2012-08-07 ENCOUNTER — Encounter: Payer: Self-pay | Admitting: Hematology & Oncology

## 2012-08-10 ENCOUNTER — Encounter: Payer: Self-pay | Admitting: Family Medicine

## 2012-08-10 ENCOUNTER — Ambulatory Visit (INDEPENDENT_AMBULATORY_CARE_PROVIDER_SITE_OTHER): Payer: Medicare Other | Admitting: Family Medicine

## 2012-08-10 VITALS — BP 125/79 | HR 87 | Temp 98.4°F | Ht 71.0 in | Wt 226.2 lb

## 2012-08-10 DIAGNOSIS — J449 Chronic obstructive pulmonary disease, unspecified: Secondary | ICD-10-CM | POA: Diagnosis not present

## 2012-08-10 DIAGNOSIS — D696 Thrombocytopenia, unspecified: Secondary | ICD-10-CM | POA: Diagnosis not present

## 2012-08-10 DIAGNOSIS — I517 Cardiomegaly: Secondary | ICD-10-CM | POA: Diagnosis not present

## 2012-08-10 DIAGNOSIS — E785 Hyperlipidemia, unspecified: Secondary | ICD-10-CM

## 2012-08-10 DIAGNOSIS — I10 Essential (primary) hypertension: Secondary | ICD-10-CM

## 2012-08-10 DIAGNOSIS — K219 Gastro-esophageal reflux disease without esophagitis: Secondary | ICD-10-CM

## 2012-08-10 NOTE — Patient Instructions (Addendum)
Try Nasacort AQ over-the-counter 1-2 sprays each nostril at bedtime for allergic rhinitis Walk with wife improve your marriage Consider holding red yeast rice for 6-8 weeks, and re\re monitoring platelets

## 2012-08-10 NOTE — Progress Notes (Signed)
Subjective:    Patient ID: Connor [name], male    DOB: 23-Nov-1945, 67 y.o.   MRN: [id number]  HPI This patient presents for recheck of multiple medical problems. His wife accompanies the patient today.  Patient Active Problem List   Diagnosis Date Noted  . H/O vitamin D deficiency 05/21/2012  . Diverticulosis of colon 05/21/2012  . Carotid artery stenosis, asymptomatic 05/21/2012  . BPH (benign prostatic hyperplasia)   . Cardiomegaly   . Thrombocytopenia   . Lap Nissen Fundoplication August 2013 11/26/2011  . Esophageal reflux 07/29/2010  . DYSLIPIDEMIA 08/27/2009  . ESSENTIAL HYPERTENSION, BENIGN 08/27/2009  . OSA (obstructive sleep apnea) 02/19/2008  . COPD, mild 05/26/2007     The allergies, current medications, past medical history, surgical history, family and social history are reviewed.  Immunizations reviewed.  Health maintenance reviewed.  The following items are outstanding: None      Review of Systems  Constitutional: Positive for activity change and fatigue (gradually improving).  HENT: Positive for sneezing (due to allergies) and postnasal drip.   Eyes: Positive for itching (due to allergies).  Respiratory: Positive for cough (dry) and shortness of breath (with exertion).   Cardiovascular: Negative.   Gastrointestinal: Positive for abdominal pain and constipation.  Endocrine: Negative.   Genitourinary: Negative.   Musculoskeletal: Positive for back pain (mid back ).  Skin: Positive for rash (itchy after showering).  Allergic/Immunologic: Positive for environmental allergies (seasonal).  Neurological: Negative.   Psychiatric/Behavioral: Negative for sleep disturbance. The patient is nervous/anxious (due to recent health problems).        Objective:   Physical Exam BP 125/79  Pulse 87  Temp(Src) 98.4 F (36.9 C) (Oral)  Ht 5\' 11"  (1.803 m)  Wt 226 lb 3.2 oz (102.604 kg)  BMI 31.56 kg/m2  The patient appeared well nourished and normally  developed, alert and oriented to time and place. Speech, behavior and judgement appear normal. Vital signs as documented.  Head exam is unremarkable. No scleral icterus or pallor noted.  Neck is without jugular venous distension, thyromegally, or carotid bruits. Carotid upstrokes are brisk bilaterally. No cervical adenopathy. Lungs are clear anteriorly and posteriorly to auscultation. Normal respiratory effort. Cardiac exam reveals regular rate and rhythm 84 per minute. First and second heart sounds normal.  No murmurs, rubs or gallops.  Abdominal exam reveals normal bowl sounds, no masses, no organomegaly and no aortic enlargement. No inguinal adenopathy. Extremities are nonedematous and both femoral and pedal pulses are normal. Skin without pallor or jaundice.  Warm and dry, without rash. Neurologic exam reveals normal deep tendon reflexes and normal sensation. Results for orders placed during the hospital encounter of 07/28/12  APTT      Result Value Range   aPTT 27  24 - 37 seconds  CBC      Result Value Range   WBC 4.9  4.0 - 10.5 K/uL   RBC 5.52  4.22 - 5.81 MIL/uL   Hemoglobin 16.2  13.0 - 17.0 g/dL   HCT 78.2  95.6 - 21.3 %   MCV 85.1  78.0 - 100.0 fL   MCH 29.3  26.0 - 34.0 pg   MCHC 34.5  30.0 - 36.0 g/dL   RDW 08.6  57.8 - 46.9 %   Platelets 124 (*) 150 - 400 K/uL  PROTIME-INR      Result Value Range   Prothrombin Time 13.3  11.6 - 15.2 seconds   INR 1.02  0.00 - 1.49  Assessment & Plan:  1. DYSLIPIDEMIA  2. COPD, mild  3. Cardiomegaly  4. Thrombocytopenia  5. Essential hypertension, benign  6. Esophageal reflux  Patient Instructions  Try Nasacort AQ over-the-counter 1-2 sprays each nostril at bedtime for allergic rhinitis Walk with wife improve your marriage Consider holding red yeast rice for 6-8 weeks, and re\re monitoring platelets

## 2012-08-11 DIAGNOSIS — M722 Plantar fascial fibromatosis: Secondary | ICD-10-CM | POA: Diagnosis not present

## 2012-08-29 DIAGNOSIS — N401 Enlarged prostate with lower urinary tract symptoms: Secondary | ICD-10-CM | POA: Diagnosis not present

## 2012-08-29 DIAGNOSIS — N200 Calculus of kidney: Secondary | ICD-10-CM | POA: Diagnosis not present

## 2012-08-31 ENCOUNTER — Ambulatory Visit (INDEPENDENT_AMBULATORY_CARE_PROVIDER_SITE_OTHER): Payer: Medicare Other | Admitting: Surgery

## 2012-08-31 VITALS — BP 134/84 | HR 78 | Temp 98.6°F | Resp 18 | Ht 71.5 in | Wt 223.0 lb

## 2012-08-31 DIAGNOSIS — D649 Anemia, unspecified: Secondary | ICD-10-CM

## 2012-08-31 DIAGNOSIS — Z9889 Other specified postprocedural states: Secondary | ICD-10-CM

## 2012-08-31 NOTE — Patient Instructions (Signed)
Thanks for your patience.  If you need further assistance after leaving the office, please call our office and speak with a [facility] nurse.  [phone].  If you want to leave a message for Dr. Torrence Hammack, please call his office phone at [phone]. 

## 2012-08-31 NOTE — Progress Notes (Signed)
Connor [name] 67 y.o.  Body mass index is 30.67 kg/(m^2).  Patient Active Problem List   Diagnosis Date Noted  . H/O vitamin D deficiency 05/21/2012  . Diverticulosis of colon 05/21/2012  . Carotid artery stenosis, asymptomatic 05/21/2012  . BPH (benign prostatic hyperplasia)   . Cardiomegaly   . Thrombocytopenia   . Lap Nissen Fundoplication August 2013 11/26/2011  . DYSLIPIDEMIA 08/27/2009  . ESSENTIAL HYPERTENSION, BENIGN 08/27/2009  . OSA (obstructive sleep apnea) 02/19/2008  . COPD, mild 05/26/2007    Allergies  Allergen Reactions  . Dexlansoprazole Diarrhea and Nausea Only    Past Surgical History  Procedure Laterality Date  . Appendectomy    . Knee surgery      right  . Shoulder surgery      left  . Nose surgery  2013  . Laparoscopic nissen fundoplication  11/24/2011    Procedure: LAPAROSCOPIC NISSEN FUNDOPLICATION;  Surgeon: Valarie Merino, MD;  Location: [facility];  Service: General;  Laterality: N/A;  . Hiatal hernia repair  11/24/2011    Procedure: LAPAROSCOPIC REPAIR OF HIATAL HERNIA;  Surgeon: Valarie Merino, MD;  Location: [facility];  Service: General;;   [name], MD No diagnosis found.  He has had an interesting last several months.  I recently he's been evaluated for anemia, iron deficiency, and thrombocytopenia. I don't think that  any of these were related to his Nissen fundoplication from last August.  It sounds like he may have developed ITP.  He has had constipation issues with oral iron. He did better with iron infusion. He has been drinking well water for 20 years I suggested he might want to have his water tested. I think he's doing well from the standpoint of his Nissen and I would not recommend anything further. I think his GERD is resolved.  Return when necessary Matt B. Daphine Deutscher, MD, [facility] [phone] beeper [phone]  08/31/2012 11:09 AM

## 2012-09-01 ENCOUNTER — Encounter: Payer: Self-pay | Admitting: Hematology & Oncology

## 2012-09-01 ENCOUNTER — Ambulatory Visit (HOSPITAL_BASED_OUTPATIENT_CLINIC_OR_DEPARTMENT_OTHER): Payer: Medicare Other | Admitting: Hematology & Oncology

## 2012-09-01 ENCOUNTER — Other Ambulatory Visit (HOSPITAL_BASED_OUTPATIENT_CLINIC_OR_DEPARTMENT_OTHER): Payer: Medicare Other | Admitting: Lab

## 2012-09-01 VITALS — BP 107/59 | HR 72 | Temp 97.5°F | Resp 18 | Ht 71.0 in | Wt 227.0 lb

## 2012-09-01 DIAGNOSIS — D696 Thrombocytopenia, unspecified: Secondary | ICD-10-CM

## 2012-09-01 DIAGNOSIS — D509 Iron deficiency anemia, unspecified: Secondary | ICD-10-CM | POA: Diagnosis not present

## 2012-09-01 HISTORY — DX: Iron deficiency anemia, unspecified: D50.9

## 2012-09-01 LAB — CBC WITH DIFFERENTIAL (CANCER CENTER ONLY)
BASO#: 0 10*3/uL (ref 0.0–0.2)
EOS%: 2.9 % (ref 0.0–7.0)
HGB: 15.8 g/dL (ref 13.0–17.1)
MCH: 30.5 pg (ref 28.0–33.4)
MCHC: 34 g/dL (ref 32.0–35.9)
MONO%: 6 % (ref 0.0–13.0)
NEUT#: 3 10*3/uL (ref 1.5–6.5)
Platelets: 115 10*3/uL — ABNORMAL LOW (ref 145–400)

## 2012-09-01 NOTE — Progress Notes (Signed)
This office note has been dictated.

## 2012-09-02 NOTE — Progress Notes (Signed)
CC:   Ernestina Penna, [name]  DIAGNOSES: 1. Chronic immune thrombocytopenia, very mild. 2. Iron deficiency anemia.  CURRENT THERAPY:  IV iron as indicated.  INTERIM HISTORY:  [name] comes in for followup.  We have done a fairly extensive workup on him.  He actually did have a bone marrow biopsy done.  This was done on April 18.  The pathology report ([id number]) showed a hypercellular marrow.  He had abundant megakaryocytes. There was normal hematopoiesis.  He did have some decreased iron stores.  His JAK2 assay was negative.  We did go ahead and give him some IV iron.  This did make him feel quite a bit better.  He got iron back on April 24.  He says he feels better.  He still has some occasional pruritus.  He has had no obvious bleeding.  There has been no leg swelling.  He has had no rashes.  PHYSICAL EXAMINATION:  General:  This is a well-developed, well- nourished white gentleman in no obvious distress.  Vital signs:  Show a temperature of 97.5, pulse 72, respiratory rate 18, blood pressure 107/59.  Weight is 227.  Head and neck:  Shows a normocephalic, atraumatic skull.  There are no ocular or oral lesions.  There are no palpable cervical or supraclavicular lymph nodes.  Lungs:  Clear bilaterally.  Cardiac:  Regular rate and rhythm with a normal S1, S2. There are no murmurs, rubs or bruits.  Abdomen:  Soft with good bowel sounds.  There is no palpable abdominal mass.  No palpable hepatosplenomegaly is noted.  Extremities:  Show no clubbing, cyanosis or edema.  Neurological:  Shows no focal neurological deficits.  Skin: Shows no rash, ecchymosis or petechia.  LABORATORY STUDIES:  White cell count is 4.2, hemoglobin 15.8, hematocrit 36.5, platelet count 115.  IMPRESSION:  [name] is a nice 67 year old gentleman with chronic immune thrombocytopenia.  Again, this appears to be mild.  His platelet count does tend to fluctuate a little bit.  I told him that his  platelet count is not bad enough that we need to treat.  We will follow him up in another couple months.  I forgot to mention that he is on aspirin.    ______________________________ Josph Macho, [name] PRE/MEDQ  D:  09/01/2012  T:  09/02/2012  Job:  [id number]

## 2012-09-08 ENCOUNTER — Ambulatory Visit: Payer: Medicare Other | Admitting: Hematology & Oncology

## 2012-09-08 ENCOUNTER — Other Ambulatory Visit: Payer: Medicare Other | Admitting: Lab

## 2012-09-20 ENCOUNTER — Telehealth: Payer: Self-pay | Admitting: Family Medicine

## 2012-09-20 ENCOUNTER — Ambulatory Visit: Payer: Medicare Other | Admitting: Pulmonary Disease

## 2012-09-22 MED ORDER — EZETIMIBE 10 MG PO TABS: 10.0000 mg | ORAL_TABLET | Freq: Every day | ORAL | Status: DC

## 2012-09-22 MED ORDER — CELECOXIB 200 MG PO CAPS: 200.0000 mg | ORAL_CAPSULE | Freq: Every day | ORAL | Status: DC

## 2012-09-22 NOTE — Telephone Encounter (Signed)
Pt needs refills on the following meds Last seen 08/10/2012

## 2012-10-20 ENCOUNTER — Ambulatory Visit (INDEPENDENT_AMBULATORY_CARE_PROVIDER_SITE_OTHER): Payer: Medicare Other | Admitting: Pulmonary Disease

## 2012-10-20 ENCOUNTER — Encounter: Payer: Self-pay | Admitting: Pulmonary Disease

## 2012-10-20 VITALS — BP 100/60 | HR 78 | Temp 98.1°F | Ht 71.5 in | Wt 228.0 lb

## 2012-10-20 DIAGNOSIS — J449 Chronic obstructive pulmonary disease, unspecified: Secondary | ICD-10-CM

## 2012-10-20 DIAGNOSIS — G4733 Obstructive sleep apnea (adult) (pediatric): Secondary | ICD-10-CM

## 2012-10-20 NOTE — Progress Notes (Signed)
Chief Complaint  Patient presents with  . Follow-up    1 year follow up. He states that there aren't changes with his breathing. Spouse states that he had some issues with his iron levels and had to have transfusions.    History of Present Illness: Connor [name] is a 67 y.o. male former smoker with OSA and chronic cough.  He has been using combivent 3 times per day.  He thought he had to do this.  He does not feel like he needs this much.  He will sometimes get breathing trouble when he is around dust.  He was found to have low platelets and low iron levels.  He is followed by Dr. Myna Hidalgo.  He was having trouble with fatigue before he was treated for his blood count disorders.  He denies wheeze, chest congestion, sputum, chest pain, or leg swelling.  TESTS: Auto-CPAP download 08/10/10 to 08/30/10>>Used on 21 of 21 nights with average 6hrs 46 min.  Average AHI 1.6 with optimal pressure 10 cm H2O. PFT 09/09/11>>FEV1 3.06 (95%), FEV1% 69, FEF 25-75% 1.70 (59%), TLC 7.67 (109%), DLCO 108%, no BD  He  has a past medical history of Hyperlipemia; Nephrolithiasis; Carotid artery stenosis; Cough (05/26/2007); COPD, mild (05/26/2007); GERD (gastroesophageal reflux disease); Shortness of breath; Sleep apnea; Cancer; Erectile dysfunction; Peyronie's disease; Anemia; BPH (benign prostatic hyperplasia); Cardiomegaly; H/O vitamin D deficiency; Thrombocytopenia; and Iron deficiency anemia, unspecified (09/01/2012).  He  has past surgical history that includes Appendectomy; Knee surgery; Shoulder surgery; Nose surgery (2013); Laparoscopic Nissen fundoplication (11/24/2011); and Hiatal hernia repair (11/24/2011).  Current Outpatient Prescriptions on File Prior to Visit  Medication Sig Dispense Refill  . alfuzosin (UROXATRAL) 10 MG 24 hr tablet Take 10 mg by mouth every morning.       [name] aspirin 81 MG tablet Take 81 mg by mouth every morning.       . celecoxib (CELEBREX) 200 MG capsule Take 1 capsule (200 mg  total) by mouth daily.  30 capsule  2  . Cholecalciferol (VITAMIN D-3) 5000 UNITS TABS Take 1 tablet by mouth daily.       . Coenzyme Q10 (CO Q 10 PO) Take 1 capsule by mouth daily.       [name] ezetimibe (ZETIA) 10 MG tablet Take 1 tablet (10 mg total) by mouth daily.  30 tablet  3  . fish oil-omega-3 fatty acids 1000 MG capsule Take 2 g by mouth daily.       . Glucosamine-Chondroit-Vit C-Mn (GLUCOSAMINE CHONDR 1500 COMPLX) CAPS Take by mouth every morning.      . Ipratropium-Albuterol (COMBIVENT RESPIMAT) 20-100 MCG/ACT AERS respimat Inhale 1 puff into the lungs every 6 (six) hours as needed for wheezing or shortness of breath.  1 Inhaler  5  . Multiple Vitamins-Minerals (CENTRUM SILVER ULTRA MENS) TABS Take 1 tablet by mouth. Once a day      . polyethylene glycol (MIRALAX / GLYCOLAX) packet Take 17 g by mouth daily.      . Probiotic Product (PHILLIPS COLON HEALTH PO) Take 1 capsule by mouth daily.      [name] pyridOXINE (VITAMIN B-6) 100 MG tablet Take 100 mg by mouth daily.      . vitamin B-12 (CYANOCOBALAMIN) 1000 MCG tablet Take 1,000 mcg by mouth daily.       No current facility-administered medications on file prior to visit.     Allergies  Allergen Reactions  . Dexlansoprazole Diarrhea and Nausea Only    Physical Exam:  General -  Healthy, no distress  HEENT - wears glasses, no sinus tenderness, clear nasal discharge, narrow nasal angles, MP 2, no exudate, no LAN Cardiac - S1S2 regular, no murmur Chest - no wheeze Abd - soft, nontender, normal bowel sounds  Ext - no E/C/C  Neuro - normal strength  Psych - normal behavior and mood   Assessment/Plan: Coralyn Helling, MD [facility] 10/20/2012, 3:06 PM Pager:  [phone] After 3pm call: [phone]

## 2012-10-20 NOTE — Patient Instructions (Signed)
Follow up in 1 year.

## 2012-10-20 NOTE — Assessment & Plan Note (Signed)
He is doing well with CPAP.  Will get a report from his CPAP machine, and call him with results.  He has requested to change to different DME.

## 2012-10-20 NOTE — Assessment & Plan Note (Signed)
He was confused about how often he needs to use combivent.  I have advised him to only use combivent as needed.  He is to call back if he feels his breathing gets worse.

## 2012-11-01 ENCOUNTER — Other Ambulatory Visit (HOSPITAL_BASED_OUTPATIENT_CLINIC_OR_DEPARTMENT_OTHER): Payer: Medicare Other | Admitting: Lab

## 2012-11-01 ENCOUNTER — Ambulatory Visit (HOSPITAL_BASED_OUTPATIENT_CLINIC_OR_DEPARTMENT_OTHER): Payer: Medicare Other | Admitting: Hematology & Oncology

## 2012-11-01 VITALS — BP 122/70 | HR 70 | Temp 97.5°F | Resp 18 | Ht 72.0 in | Wt 225.0 lb

## 2012-11-01 DIAGNOSIS — D509 Iron deficiency anemia, unspecified: Secondary | ICD-10-CM | POA: Diagnosis not present

## 2012-11-01 DIAGNOSIS — D696 Thrombocytopenia, unspecified: Secondary | ICD-10-CM

## 2012-11-01 LAB — CBC WITH DIFFERENTIAL (CANCER CENTER ONLY)
Eosinophils Absolute: 0.1 10*3/uL (ref 0.0–0.5)
LYMPH#: 1 10*3/uL (ref 0.9–3.3)
MCH: 31.2 pg (ref 28.0–33.4)
MONO#: 0.3 10*3/uL (ref 0.1–0.9)
MONO%: 7.2 % (ref 0.0–13.0)
NEUT#: 3.1 10*3/uL (ref 1.5–6.5)
Platelets: 114 10*3/uL — ABNORMAL LOW (ref 145–400)
RBC: 5.19 10*6/uL (ref 4.20–5.70)
WBC: 4.6 10*3/uL (ref 4.0–10.0)

## 2012-11-01 LAB — IRON AND TIBC CHCC: UIBC: 85 ug/dL — ABNORMAL LOW (ref 117–376)

## 2012-11-01 LAB — CHCC SATELLITE - SMEAR

## 2012-11-01 LAB — FERRITIN CHCC: Ferritin: 86 ng/ml (ref 22–316)

## 2012-11-01 NOTE — Progress Notes (Signed)
This office note has been dictated.

## 2012-11-02 NOTE — Progress Notes (Signed)
CC:   Ernestina Penna, [name]  DIAGNOSIS: 1. Chronic immune thrombocytopenia-mild/asymptomatic. 2. Iron deficiency anemia.  CURRENT THERAPY:  Observation.  INTERIM HISTORY:  Connor [name] comes in for his followup.  He is doing okay.  He and his wife just got back from the beach.  He is nice and tan.  He does have some [place] blood in him.  We did give him IV iron back in April.  At that point in time, his ferritin was 18 with iron saturation of 45%.  I thought it would be worthwhile giving him some iron.  This did help him. It made him feel better.  He has had no bleeding.  He has had still some pruritus after taking showers.  He has had no rashes.  There is no cough or shortness of breath.  He has had no headache.  PHYSICAL EXAMINATION:  General:  This is a well-developed, well- nourished white gentleman in no obvious distress.  Vital signs: Temperature of 97.5, pulse 70, respiratory rate 18, blood pressure 122/70.  Weight is 225.  Head and neck:  Normocephalic, atraumatic skull.  There are no ocular or oral lesions.  There are no palpable cervical or supraclavicular lymph nodes.  Lungs:  Clear bilaterally. Cardiac:  Regular rate and rhythm with a normal S1, S2.  There are no murmurs, rubs or bruits.  Abdomen:  Soft.  There is no fluid wave. There is no palpable abdominal mass.  There is no palpable hepatosplenomegaly.  Extremities:  Show no clubbing, cyanosis or edema. Skin:  No rashes, ecchymosis, or petechia.  Neurological:  No focal neurological deficits.  LABORATORY STUDIES:  White cell count 4.6, hemoglobin 16.2 hematocrit 46.8, platelet count 114.  MCV is 90.  IMPRESSION:  [name] is a 67 year old gentleman with a couple hematologic issues.  He has chronic immune thrombocytopenia.  This is very mild.  This is not treated.  He is asymptomatic with this.  We will see what his iron studies are.  One would have to think that his iron studies are okay.  His MCV is 90 which  should indicate decent iron stores.  Of note, his JAK2 assay when we initially saw him was negative.  We will plan to get him back in another 2 or 3 months for followup.  I do not see a need for blood work in between visits.    ______________________________ Josph Macho, [name] PRE/MEDQ  D:  11/01/2012  T:  11/02/2012  Job:  [id number]

## 2012-11-03 ENCOUNTER — Other Ambulatory Visit: Payer: Medicare Other | Admitting: Lab

## 2012-11-03 ENCOUNTER — Ambulatory Visit: Payer: Medicare Other | Admitting: Hematology & Oncology

## 2012-11-13 ENCOUNTER — Encounter: Payer: Self-pay | Admitting: Pulmonary Disease

## 2012-11-15 ENCOUNTER — Telehealth: Payer: Self-pay | Admitting: Pulmonary Disease

## 2012-11-15 NOTE — Telephone Encounter (Signed)
Pt is aware of download results. 

## 2012-11-15 NOTE — Telephone Encounter (Signed)
lmtcb

## 2012-11-15 NOTE — Telephone Encounter (Signed)
CPAP 10/09/12 to 11/07/12 >> Used on 29 of 30 nights with average 6 hrs 44 min.  CPAP 10 cm H2O.  Will have my nurse inform pt that CPAP report shows excellent compliance.  No change to current set up.

## 2012-12-18 ENCOUNTER — Other Ambulatory Visit: Payer: Self-pay | Admitting: Family Medicine

## 2013-01-01 ENCOUNTER — Ambulatory Visit (HOSPITAL_BASED_OUTPATIENT_CLINIC_OR_DEPARTMENT_OTHER): Payer: Medicare Other | Admitting: Hematology & Oncology

## 2013-01-01 ENCOUNTER — Other Ambulatory Visit (HOSPITAL_BASED_OUTPATIENT_CLINIC_OR_DEPARTMENT_OTHER): Payer: Medicare Other | Admitting: Lab

## 2013-01-01 VITALS — BP 125/66 | HR 65 | Temp 97.8°F | Resp 18 | Ht 72.0 in | Wt 229.0 lb

## 2013-01-01 DIAGNOSIS — D509 Iron deficiency anemia, unspecified: Secondary | ICD-10-CM | POA: Diagnosis not present

## 2013-01-01 DIAGNOSIS — D696 Thrombocytopenia, unspecified: Secondary | ICD-10-CM | POA: Diagnosis not present

## 2013-01-01 LAB — CBC WITH DIFFERENTIAL (CANCER CENTER ONLY)
BASO#: 0 10*3/uL (ref 0.0–0.2)
HCT: 47.8 % (ref 38.7–49.9)
HGB: 16.4 g/dL (ref 13.0–17.1)
LYMPH#: 0.9 10*3/uL (ref 0.9–3.3)
LYMPH%: 18.9 % (ref 14.0–48.0)
MCV: 90 fL (ref 82–98)
MONO#: 0.3 10*3/uL (ref 0.1–0.9)
NEUT%: 72.2 % (ref 40.0–80.0)
WBC: 4.5 10*3/uL (ref 4.0–10.0)

## 2013-01-01 LAB — IRON AND TIBC CHCC
%SAT: 58 % — ABNORMAL HIGH (ref 20–55)
TIBC: 263 ug/dL (ref 202–409)
UIBC: 112 ug/dL — ABNORMAL LOW (ref 117–376)

## 2013-01-01 LAB — CHCC SATELLITE - SMEAR

## 2013-01-01 NOTE — Progress Notes (Signed)
This office note has been dictated.

## 2013-01-02 ENCOUNTER — Telehealth: Payer: Self-pay | Admitting: *Deleted

## 2013-01-02 LAB — ERYTHROPOIETIN: Erythropoietin: 7.6 m[IU]/mL (ref 2.6–18.5)

## 2013-01-02 NOTE — Telephone Encounter (Addendum)
Message copied by Wynonia Hazard on Tue Jan 02, 2013  4:09 PM ------      Message from: Josph Macho      Created: Tue Jan 02, 2013  7:45 AM       Call - iron is not low!!  Cindee Lame ------  Left message on pt's voicemail giving him the above message.

## 2013-01-09 NOTE — Progress Notes (Signed)
CC:   Connor [name], [name]  DIAGNOSES: 1. Chronic immune thrombocytopenia, stable. 2. Iron deficiency anemia. 3. Questionable polycythemia.  CURRENT THERAPY:  Observation.  INTERIM HISTORY:  [name] comes in for his followup.  He is doing okay, although he is getting tired again.  I am still not sure as to why he keeps getting tired.  He certainly does not appear to have any problems with iron deficiency. I think he got iron back in March.  He has had no headache.  He just gets tired easily.  He has had no joint swelling.  There has been no leg swelling.  He has had no abdominal pain.  There has been no change in bowel or bladder habits.  He still has the pruritus after showers.  This is just a "prickly" feeling.  Again, we have checked him for polycythemia.  We have done a bone marrow on him.  I am not sure at all if the polycythemia still may not be an issue here.  His JAK2 assay was negative.  His erythropoietin level has been on the low side, which may go along with underlying polycythemia.  I talked to him and his wife.  I told them that we could certainly phlebotomize him to get his hematocrit below 45%.  This certainly might help him out.  He is going to think about this.  PHYSICAL EXAMINATION:  General:  This is a well-developed, well- nourished white gentleman in no obvious distress.  Vital signs: Temperature of 97.6, pulse 65, respiratory rate 18, blood pressure 125/66.  Weight is 229.  Head and neck:  Normocephalic, atraumatic skull.  He has no ocular or oral lesions.  There is some facial plethora.  He has no adenopathy in the neck.  Thyroid is nonpalpable. Lungs:  Clear bilaterally.  Cardiac:  Regular rate and rhythm with a normal S1 and S2.  There are no murmurs, rubs, or bruits.  Abdomen: Soft.  He has good bowel sounds.  There is no fluid wave.  There is no palpable hepatosplenomegaly.  Extremities:  Shows no clubbing, cyanosis, or edema.  Skin:  Exam  does show a little bit of a "ruddy" complexion.  LABORATORY STUDIES:  White cell count 4.5, hemoglobin 16.4, hematocrit 47.8, platelet count is 115,000.  MCV is 90.  On his peripheral blood smear, which I reviewed, I show a normochromic, normocytic population of red blood cells.  He has no nucleated red blood cells.  There are no teardrop cells.  I saw no rouleaux formation. White cells appear normal in morphology and maturation.  He has no immature myeloid or lymphoid forms.  Platelets are mildly decreased in number.  He has well-granulated platelets.  IMPRESSION:  [name] is a 67 year old gentleman with chronic immune thrombocytopenia.  This is not a problem.  I think the bigger issue is this possible polycythemia.  Again, he has this aqua pruritus.  This is often a fairly decent indicator of polycythemia.  Again, even though he is JAK2 negative, he has a very low erythropoietin level, so again, phlebotomizing him maybe an idea.  It is possibly that he may be feeling tired because he has some increased viscosity.  He says he wants to donate blood.  I told him I do not see a problem with this.  I want to see him back in 2 months' time.    ______________________________ Josph Macho, [name] PRE/MEDQ  D:  01/01/2013  T:  01/09/2013  Job:  [id number]

## 2013-01-25 ENCOUNTER — Other Ambulatory Visit: Payer: Self-pay | Admitting: Dermatology

## 2013-01-25 DIAGNOSIS — L821 Other seborrheic keratosis: Secondary | ICD-10-CM | POA: Diagnosis not present

## 2013-01-25 DIAGNOSIS — D485 Neoplasm of uncertain behavior of skin: Secondary | ICD-10-CM | POA: Diagnosis not present

## 2013-01-25 DIAGNOSIS — D239 Other benign neoplasm of skin, unspecified: Secondary | ICD-10-CM | POA: Diagnosis not present

## 2013-01-25 DIAGNOSIS — L819 Disorder of pigmentation, unspecified: Secondary | ICD-10-CM | POA: Diagnosis not present

## 2013-01-25 DIAGNOSIS — D2370 Other benign neoplasm of skin of unspecified lower limb, including hip: Secondary | ICD-10-CM | POA: Diagnosis not present

## 2013-01-25 DIAGNOSIS — Z85828 Personal history of other malignant neoplasm of skin: Secondary | ICD-10-CM | POA: Diagnosis not present

## 2013-01-25 DIAGNOSIS — L738 Other specified follicular disorders: Secondary | ICD-10-CM | POA: Diagnosis not present

## 2013-01-29 ENCOUNTER — Other Ambulatory Visit (INDEPENDENT_AMBULATORY_CARE_PROVIDER_SITE_OTHER): Payer: Medicare Other

## 2013-01-29 DIAGNOSIS — E559 Vitamin D deficiency, unspecified: Secondary | ICD-10-CM | POA: Diagnosis not present

## 2013-01-29 DIAGNOSIS — E785 Hyperlipidemia, unspecified: Secondary | ICD-10-CM | POA: Diagnosis not present

## 2013-01-29 DIAGNOSIS — R5381 Other malaise: Secondary | ICD-10-CM | POA: Diagnosis not present

## 2013-01-29 LAB — POCT CBC
HCT, POC: 52.2 % (ref 43.5–53.7)
Hemoglobin: 17.3 g/dL (ref 14.1–18.1)
Lymph, poc: 1.1 (ref 0.6–3.4)
MCH, POC: 29.7 pg (ref 27–31.2)
MCHC: 33.2 g/dL (ref 31.8–35.4)
MCV: 89.4 fL (ref 80–97)
MPV: 8.8 fL (ref 0–99.8)
POC Granulocyte: 3.3 (ref 2–6.9)
POC LYMPH PERCENT: 23.9 %L (ref 10–50)
Platelet Count, POC: 113 10*3/uL — AB (ref 142–424)
RBC: 5.8 M/uL (ref 4.69–6.13)
RDW, POC: 12.6 %
WBC: 4.8 10*3/uL (ref 4.6–10.2)

## 2013-01-31 LAB — NMR, LIPOPROFILE
Cholesterol: 137 mg/dL (ref <200)
HDL Cholesterol by NMR: 37 mg/dL — ABNORMAL LOW (ref >=40)
LDL Size: 19.7 nm — ABNORMAL LOW (ref >20.5)
Small LDL Particle Number: 1242 nmol/L — ABNORMAL HIGH (ref <=527)
Triglycerides by NMR: 109 mg/dL (ref <150)

## 2013-01-31 LAB — HEPATIC FUNCTION PANEL
ALT: 14 IU/L (ref 0–44)
AST: 16 IU/L (ref 0–40)
Albumin: 4.5 g/dL (ref 3.6–4.8)
Alkaline Phosphatase: 61 IU/L (ref 39–117)

## 2013-01-31 LAB — BMP8+EGFR
BUN/Creatinine Ratio: 22 (ref 10–22)
Creatinine, Ser: 0.83 mg/dL (ref 0.76–1.27)
GFR calc Af Amer: 104 mL/min/{1.73_m2} (ref >59)
GFR calc non Af Amer: 90 mL/min/{1.73_m2} (ref >59)
Potassium: 4.3 mmol/L (ref 3.5–5.2)
Sodium: 144 mmol/L (ref 134–144)

## 2013-02-07 ENCOUNTER — Ambulatory Visit (INDEPENDENT_AMBULATORY_CARE_PROVIDER_SITE_OTHER): Payer: Medicare Other | Admitting: Family Medicine

## 2013-02-07 ENCOUNTER — Telehealth: Payer: Self-pay | Admitting: *Deleted

## 2013-02-07 ENCOUNTER — Encounter: Payer: Self-pay | Admitting: Family Medicine

## 2013-02-07 ENCOUNTER — Encounter (INDEPENDENT_AMBULATORY_CARE_PROVIDER_SITE_OTHER): Payer: Self-pay

## 2013-02-07 VITALS — BP 132/80 | HR 79 | Temp 99.1°F | Ht 72.0 in | Wt 226.0 lb

## 2013-02-07 DIAGNOSIS — N400 Enlarged prostate without lower urinary tract symptoms: Secondary | ICD-10-CM | POA: Diagnosis not present

## 2013-02-07 DIAGNOSIS — G609 Hereditary and idiopathic neuropathy, unspecified: Secondary | ICD-10-CM | POA: Diagnosis not present

## 2013-02-07 DIAGNOSIS — G629 Polyneuropathy, unspecified: Secondary | ICD-10-CM

## 2013-02-07 DIAGNOSIS — Z8639 Personal history of other endocrine, nutritional and metabolic disease: Secondary | ICD-10-CM | POA: Diagnosis not present

## 2013-02-07 DIAGNOSIS — Z23 Encounter for immunization: Secondary | ICD-10-CM

## 2013-02-07 DIAGNOSIS — I10 Essential (primary) hypertension: Secondary | ICD-10-CM | POA: Diagnosis not present

## 2013-02-07 DIAGNOSIS — I517 Cardiomegaly: Secondary | ICD-10-CM

## 2013-02-07 DIAGNOSIS — G4733 Obstructive sleep apnea (adult) (pediatric): Secondary | ICD-10-CM

## 2013-02-07 DIAGNOSIS — D509 Iron deficiency anemia, unspecified: Secondary | ICD-10-CM

## 2013-02-07 DIAGNOSIS — G589 Mononeuropathy, unspecified: Secondary | ICD-10-CM

## 2013-02-07 DIAGNOSIS — J449 Chronic obstructive pulmonary disease, unspecified: Secondary | ICD-10-CM

## 2013-02-07 DIAGNOSIS — E785 Hyperlipidemia, unspecified: Secondary | ICD-10-CM | POA: Diagnosis not present

## 2013-02-07 DIAGNOSIS — D696 Thrombocytopenia, unspecified: Secondary | ICD-10-CM

## 2013-02-07 NOTE — Progress Notes (Signed)
Subjective:    Patient ID: Connor [name], male    DOB: 08/16/1945, 67 y.o.   MRN: [id number]  HPI Pt here for follow up and management of chronic medical problems. The patient does complain of tingling from his neck to his ankles. He has discussed this with the hematologist and they cannot give him a reason for this. His recent lab work was reviewed with him today. His total LDL particle number remains elevated.     Patient Active Problem List   Diagnosis Date Noted  . Iron deficiency anemia, unspecified 09/01/2012  . H/O vitamin D deficiency 05/21/2012  . Diverticulosis of colon 05/21/2012  . Carotid artery stenosis, asymptomatic 05/21/2012  . BPH (benign prostatic hyperplasia)   . Cardiomegaly   . Thrombocytopenia   . Lap Nissen Fundoplication August 2013 11/26/2011  . DYSLIPIDEMIA 08/27/2009  . ESSENTIAL HYPERTENSION, BENIGN 08/27/2009  . OSA (obstructive sleep apnea) 02/19/2008  . COPD, mild 05/26/2007   Outpatient Encounter Prescriptions as of 02/07/2013  Medication Sig Dispense Refill  . alfuzosin (UROXATRAL) 10 MG 24 hr tablet Take 10 mg by mouth every morning.       [name] aspirin 81 MG tablet Take 81 mg by mouth every morning.       . CELEBREX 200 MG capsule TAKE 1 CAPSULE (200 MG TOTAL) BY MOUTH DAILY.  30 capsule  2  . Cholecalciferol (VITAMIN D-3) 5000 UNITS TABS Take 1 tablet by mouth daily.       . Coenzyme Q10 (CO Q 10 PO) Take 1 capsule by mouth daily.       [name] ezetimibe (ZETIA) 10 MG tablet Take 1 tablet (10 mg total) by mouth daily.  30 tablet  3  . fish oil-omega-3 fatty acids 1000 MG capsule Take 2 g by mouth daily.       . Glucosamine-Chondroit-Vit C-Mn (GLUCOSAMINE CHONDR 1500 COMPLX) CAPS Take by mouth every morning.      . Ipratropium-Albuterol (COMBIVENT) 20-100 MCG/ACT AERS respimat Inhale 1 puff into the lungs as needed for wheezing or shortness of breath.      . Multiple Vitamins-Minerals (CENTRUM SILVER ULTRA MENS) TABS Take 1 tablet by mouth every  morning. Once a day      . polyethylene glycol (MIRALAX / GLYCOLAX) packet Take 17 g by mouth as needed.       . Probiotic Product (PHILLIPS COLON HEALTH PO) Take 1 capsule by mouth as needed.       . pyridOXINE (VITAMIN B-6) 100 MG tablet Take 100 mg by mouth daily.       . vitamin B-12 (CYANOCOBALAMIN) 1000 MCG tablet Take 1,000 mcg by mouth daily.       No facility-administered encounter medications on file as of 02/07/2013.    Review of Systems  Constitutional: Negative.   HENT: Negative.   Eyes: Negative.   Respiratory: Negative.   Cardiovascular: Negative.   Gastrointestinal: Negative.   Endocrine: Negative.   Genitourinary: Negative.   Musculoskeletal: Negative.   Skin: Negative.        Tingling- when showers from shoulders to ankles   Allergic/Immunologic: Negative.   Neurological: Negative.   Hematological: Negative.        Platelets  Psychiatric/Behavioral: Negative.         Objective:   Physical Exam  Nursing note and vitals reviewed. Constitutional: He is oriented to person, place, and time. He appears well-developed and well-nourished. No distress.  HENT:  Head: Normocephalic and atraumatic.  Right Ear: External ear normal.  Left Ear: External ear normal.  Nose: Nose normal.  Mouth/Throat: Oropharynx is clear and moist. No oropharyngeal exudate.  Eyes: Conjunctivae and EOM are normal. Pupils are equal, round, and reactive to light. Right eye exhibits no discharge. Left eye exhibits no discharge. No scleral icterus.  Neck: Normal range of motion. Neck supple. No tracheal deviation present. No thyromegaly present.  There were no carotid bruits  Cardiovascular: Normal rate, regular rhythm, normal heart sounds and intact distal pulses.  Exam reveals no gallop and no friction rub.   No murmur heard. At 72 per minute  Pulmonary/Chest: Effort normal and breath sounds normal. No respiratory distress. He has no wheezes. He has no rales. He exhibits no tenderness.   Abdominal: Soft. Bowel sounds are normal. He exhibits no mass. There is no tenderness. There is no rebound and no guarding.  Genitourinary:  This exam is done yearly in March the urology  Musculoskeletal: Normal range of motion. He exhibits no edema and no tenderness.  Lymphadenopathy:    He has no cervical adenopathy.  Neurological: He is alert and oriented to person, place, and time. He has normal reflexes. No cranial nerve deficit.  Skin: Skin is warm and dry. No rash noted. No erythema. No pallor.  Psychiatric: He has a normal mood and affect. His behavior is normal. Judgment and thought content normal.   BP 132/80  Pulse 79  Temp(Src) 99.1 F (37.3 C) (Oral)  Ht 6' (1.829 m)  Wt 226 lb (102.513 kg)  BMI 30.64 kg/m2        Assessment & Plan:   1. BPH (benign prostatic hyperplasia)   2. DYSLIPIDEMIA   3. Essential hypertension, benign   4. H/O vitamin D deficiency   5. Thrombocytopenia   6. OSA (obstructive sleep apnea)   7. Iron deficiency anemia, unspecified   8. Cardiomegaly   9. COPD, mild   10. Unspecified hereditary and idiopathic peripheral neuropathy   11. Neuropathy   12. Need for prophylactic vaccination and inoculation against influenza    Orders Placed This Encounter  Procedures  . Vitamin B12  . Thyroid Panel With TSH   No orders of the defined types were placed in this encounter.   Patient Instructions  Continue Current medications and aggressive therapeutic lifestyle changes which include diet and exercise Be careful and not put yourself at risk for falling Ask the hematologist about  being able to take a statin drug Regarding the results of the B12 level and a thyroid profile watch those labs or return to    Nyra Capes MD

## 2013-02-07 NOTE — Patient Instructions (Signed)
Continue Current medications and aggressive therapeutic lifestyle changes which include diet and exercise Be careful and not put yourself at risk for falling Ask the hematologist about  being able to take a statin drug Regarding the results of the B12 level and a thyroid profile watch those labs or return to

## 2013-02-08 LAB — THYROID PANEL WITH TSH
Free Thyroxine Index: 1.8 (ref 1.2–4.9)
T3 Uptake Ratio: 31 % (ref 24–39)
T4, Total: 5.9 ug/dL (ref 4.5–12.0)

## 2013-02-08 LAB — VITAMIN B12: Vitamin B-12: >1999 pg/mL — ABNORMAL HIGH (ref 211–946)

## 2013-02-16 ENCOUNTER — Ambulatory Visit: Payer: Medicare Other | Admitting: Cardiovascular Disease

## 2013-02-19 ENCOUNTER — Ambulatory Visit (INDEPENDENT_AMBULATORY_CARE_PROVIDER_SITE_OTHER): Payer: Medicare Other | Admitting: Cardiovascular Disease

## 2013-02-19 ENCOUNTER — Encounter: Payer: Self-pay | Admitting: Cardiovascular Disease

## 2013-02-19 VITALS — BP 140/82 | HR 77 | Ht 71.0 in | Wt 230.7 lb

## 2013-02-19 DIAGNOSIS — D696 Thrombocytopenia, unspecified: Secondary | ICD-10-CM | POA: Diagnosis not present

## 2013-02-19 DIAGNOSIS — Z8249 Family history of ischemic heart disease and other diseases of the circulatory system: Secondary | ICD-10-CM

## 2013-02-19 DIAGNOSIS — E785 Hyperlipidemia, unspecified: Secondary | ICD-10-CM

## 2013-02-19 DIAGNOSIS — I10 Essential (primary) hypertension: Secondary | ICD-10-CM | POA: Diagnosis not present

## 2013-02-19 DIAGNOSIS — Z23 Encounter for immunization: Secondary | ICD-10-CM

## 2013-02-19 NOTE — Progress Notes (Signed)
Patient ID: Connor [name], male   DOB: 1946-02-07, 67 y.o.   MRN: [id number]     HPI: Connor [name] is a 67 y.o. male who presents to the office for one year cardiology evaluation.   [name] is a 67 year old gentleman who has a history of significant hyperlipidemia for which the past he had been on combination therapy. He had taken Niaspan for some time and more recently had been taking Zetia as well as Crestor. He has a strong family history for coronary artery disease and 2 of his younger brothers have had previous car coronary artery stenting. He does have a history of GERD and is status post laparoscopic Nissen fundoplication by Dr. Lequita Halt due to hiatal hernia severe GERD. An echo Doppler study in November 2000 1330 port greater than 55% ejection fraction with grade 1 diastolic dysfunction. He had mild left atrial enlargement and evidence for mild aortic valve sclerosis. He underwent carotid duplex imaging which was normal. Nuclear perfusion study which had been done to evaluate shortness of breath showed normal perfusion and function.  When I saw him last year he did have microcytic indices. He subsequently was found to be iron deficient. He also subsequently developed thrombocytopenia and has been under the care of Dr. Myna Hidalgo. He apparently did receive IV iron therapy, and underwent ultrasound of the spleen as well as bone marrow. He also has undergone colonoscopy. He tells me his platelet count had dropped to 73,000 but most recently this is back up to approximately 113,000.  From a cardiac standpoint, he denies chest pain. He is unaware of palpitations. There is no presyncope or syncope. He remains active with playing golf, yardwork, and walks with doing some cardio 2-3 days per week.  Past Medical History  Diagnosis Date  . Hyperlipemia   . Nephrolithiasis   . Carotid artery stenosis   . Cough 05/26/2007  . COPD, mild 05/26/2007    PFT 09/09/11>>FEV1 3.06 (95%), FEV1% 69, FEF 25-75%  1.70 (59%), TLC 7.67 (109%), DLCO 108%, no BD    . GERD (gastroesophageal reflux disease)   . Shortness of breath     with exertion   . Sleep apnea     Cpap-   . Cancer     squamous cell on nose , inside nose   . Erectile dysfunction   . Peyronie's disease   . Anemia   . BPH (benign prostatic hyperplasia)   . Cardiomegaly   . H/O vitamin D deficiency   . Thrombocytopenia   . Iron deficiency anemia, unspecified 09/01/2012    Past Surgical History  Procedure Laterality Date  . Appendectomy    . Knee surgery      right  . Shoulder surgery      left  . Nose surgery  2013  . Laparoscopic nissen fundoplication  11/24/2011    Procedure: LAPAROSCOPIC NISSEN FUNDOPLICATION;  Surgeon: Valarie Merino, MD;  Location: [facility];  Service: General;  Laterality: N/A;  . Hiatal hernia repair  11/24/2011    Procedure: LAPAROSCOPIC REPAIR OF HIATAL HERNIA;  Surgeon: Valarie Merino, MD;  Location: [facility];  Service: General;;    Allergies  Allergen Reactions  . Dexlansoprazole Diarrhea and Nausea Only    Current Outpatient Prescriptions  Medication Sig Dispense Refill  . alfuzosin (UROXATRAL) 10 MG 24 hr tablet Take 10 mg by mouth every morning.       [name] aspirin 81 MG tablet Take 81 mg by mouth every morning.       [name]  CELEBREX 200 MG capsule TAKE 1 CAPSULE (200 MG TOTAL) BY MOUTH DAILY.  30 capsule  2  . Cholecalciferol (VITAMIN D-3) 5000 UNITS TABS Take 1 tablet by mouth daily.       . Coenzyme Q10 (CO Q 10 PO) Take 1 capsule by mouth daily.       . fish oil-omega-3 fatty acids 1000 MG capsule Take 2 g by mouth daily.       . Glucosamine-Chondroit-Vit C-Mn (GLUCOSAMINE CHONDR 1500 COMPLX) CAPS Take by mouth every morning.      . Ipratropium-Albuterol (COMBIVENT) 20-100 MCG/ACT AERS respimat Inhale 1 puff into the lungs as needed for wheezing or shortness of breath.      . Multiple Vitamins-Minerals (CENTRUM SILVER ULTRA MENS) TABS Take 1 tablet by mouth every morning. Once a day      .  polyethylene glycol (MIRALAX / GLYCOLAX) packet Take 17 g by mouth as needed.       . Probiotic Product (PHILLIPS COLON HEALTH PO) Take 1 capsule by mouth as needed.       . pyridOXINE (VITAMIN B-6) 100 MG tablet Take 100 mg by mouth daily.        No current facility-administered medications for this visit.    History   Social History  . Marital Status: Married    Spouse Name: N/A    Number of Children: 3  . Years of Education: N/A   Occupational History  . retired    Social History Main Topics  . Smoking status: Former Smoker -- 11 years    Types: Cigarettes    Quit date: 04/12/1966  . Smokeless tobacco: Never Used     Comment: Quit but started again 4-5 cigs a day--quit 1968  . Alcohol Use: Yes     Comment: seldom  . Drug Use: No  . Sexual Activity: Not on file   Other Topics Concern  . Not on file   Social History Narrative  . No narrative on file    Family History  Problem Relation Age of Onset  . Heart disease Mother   . Diabetes Mother   . Heart disease Father   . Heart disease Sister   . Heart disease Brother   . Diabetes Brother   . Colon cancer Neg Hx   . Esophageal cancer Neg Hx   . Rectal cancer Neg Hx   . Stomach cancer Neg Hx   . Liver cancer Paternal Grandfather   . Throat cancer Maternal Grandfather    Social history is notable in that he is married has 3 children 5 grandchildren and 3 stepgrandchildren. He is retired. No tobacco history. He does drink rare wine. He exercises several days per week for 30-45 minutes.   ROS is negative for fevers, chills or night sweats.  He denies skin changes. In the past when he was on Niaspan he did have flushing. He denies cough or sputum production. There is no wheezing. There is no presyncope or syncope. He denies palpitations. He denies chest pain and he does have a history of GERD status post fundoplication for his hiatal hernia. He does have a history of thrombocytopenia which has improved. He had been taken  off his statin and Zetia. He denies blood in his stool presently. He denies any urinary symptoms. He denies myalgias. He denies paresthesias. He denies tremors. There is no diabetes. Other comprehensive 12 point system review is negative.  PE BP 140/82  Pulse 77  Ht 5\' 11"  (1.803 m)  Wt 230 lb 11.2 oz (104.645 kg)  BMI 32.19 kg/m2  General: Alert, oriented, no distress.  Skin: normal turgor, no rashes HEENT: Normocephalic, atraumatic. Pupils round and reactive; sclera anicteric;no lid lag.  Nose without nasal septal hypertrophy Mouth/Parynx benign; Mallinpatti scale 2/3 Neck: No JVD, no carotid briuts Lungs: clear to ausculatation and percussion; no wheezing or rales Heart: RRR, s1 s2 normal 6 systolic Abdomen: soft, nontender; no hepatosplenomehaly, BS+; abdominal aorta nontender and not dilated by palpation. Pulses 2+ Extremities: no clubbing cyanosis or edema, Homan's sign negative  Neurologic: grossly nonfocal Psychologic: normal affect and mood.  ECG: Sinus rhythm at 77 beats per minute. No change.  LABS:  BMET    Component Value Date/Time   NA 144 01/29/2013 1012   NA 143 07/20/2012 0947   K 4.3 01/29/2013 1012   CL 103 01/29/2013 1012   CO2 23 01/29/2013 1012   GLUCOSE 100* 01/29/2013 1012   GLUCOSE 98 07/20/2012 0947   BUN 18 01/29/2013 1012   BUN 17 07/20/2012 0947   CREATININE 0.83 01/29/2013 1012   CREATININE 0.94 07/20/2012 0947   CALCIUM 9.2 01/29/2013 1012   GFRNONAA 90 01/29/2013 1012   GFRAA 104 01/29/2013 1012     Hepatic Function Panel     Component Value Date/Time   PROT 6.2 01/29/2013 1012   PROT 6.4 07/20/2012 0947   ALBUMIN 4.4 07/20/2012 0947   AST 16 01/29/2013 1012   ALT 14 01/29/2013 1012   ALKPHOS 61 01/29/2013 1012   BILITOT 0.7 01/29/2013 1012   BILIDIR 0.16 01/29/2013 1012   IBILI 0.6 07/20/2012 0947     CBC    Component Value Date/Time   WBC 4.8 01/29/2013 1036   WBC 4.5 01/01/2013 0940   WBC 4.9 07/28/2012 0725   RBC 5.8  01/29/2013 1036   RBC 5.52 07/28/2012 0725   RBC 5.75 05/04/2012 1311   HGB 17.3 01/29/2013 1036   HGB 16.4 01/01/2013 0940   HGB 16.2 07/28/2012 0725   HCT 52.2 01/29/2013 1036   HCT 47.8 01/01/2013 0940   HCT 47.0 07/28/2012 0725   PLT 115* 01/01/2013 0940   PLT 124* 07/28/2012 0725   MCV 89.4 01/29/2013 1036   MCV 90 01/01/2013 0940   MCV 85.1 07/28/2012 0725   MCH 29.7 01/29/2013 1036   MCH 30.8 01/01/2013 0940   MCH 29.3 07/28/2012 0725   MCHC 33.2 01/29/2013 1036   MCHC 34.3 01/01/2013 0940   MCHC 34.5 07/28/2012 0725   RDW 12.4 01/01/2013 0940   RDW 14.1 07/28/2012 0725   LYMPHSABS 0.9 01/01/2013 0940   LYMPHSABS 0.8 11/25/2011 0330   MONOABS 0.7 11/25/2011 0330   EOSABS 0.1 01/01/2013 0940   EOSABS 0.0 11/25/2011 0330   BASOSABS 0.0 01/01/2013 0940   BASOSABS 0.0 11/25/2011 0330     BNP No results found for this basename: probnp    Lipid Panel     Component Value Date/Time   CHOL 137 01/29/2013 1012   TRIG 128 07/20/2012 0947   LDLCALC 93 07/20/2012 0947     RADIOLOGY: No results found.    ASSESSMENT AND PLAN: From a cardiac standpoint, Mr. Jeffreys has remained relatively stable. His blood pressure today is controlled. I did review recent laboratory which have been done by Dr. Christell Constant. Her recent NMR lipoprofile off lipid-lowering therapy still showed an increased LDL particle #1524 with increased small LDL particle #1242. Total cholesterol is 137 HDL 37 and LDL 78. He had increased insulin resistance score at 64.  Apparently he'll be checking with Dr. and over concerning reinstitution of lipid-lowering therapy. If he cannot take a statin, and may be worthwhile to retry Zetia which should provide an additional 20% reduction his LDL. I recommended that he exercise at least 5 days per week and ideally up to 60 minutes if possible. He is cognizant of his insulin resistance and need for potential dietary modification. He's not having any anginal symptoms. Blood pressure is controlled. I will  see him in one year for cardiology reassessment or sooner proms rise to     Lennette Bihari, MD, [facility]  02/19/2013 11:45 AM

## 2013-02-19 NOTE — Patient Instructions (Addendum)
  Your physician recommends that you schedule a follow-up appointment in: 1 YEAR.no changes were made today in your thrapy.

## 2013-02-20 ENCOUNTER — Encounter: Payer: Self-pay | Admitting: Cardiovascular Disease

## 2013-03-05 ENCOUNTER — Ambulatory Visit (HOSPITAL_BASED_OUTPATIENT_CLINIC_OR_DEPARTMENT_OTHER): Payer: Medicare Other | Admitting: Hematology & Oncology

## 2013-03-05 ENCOUNTER — Other Ambulatory Visit (HOSPITAL_BASED_OUTPATIENT_CLINIC_OR_DEPARTMENT_OTHER): Payer: Medicare Other | Admitting: Lab

## 2013-03-05 VITALS — BP 132/73 | HR 82 | Temp 97.6°F | Resp 18 | Ht 70.0 in | Wt 228.0 lb

## 2013-03-05 DIAGNOSIS — D693 Immune thrombocytopenic purpura: Secondary | ICD-10-CM

## 2013-03-05 DIAGNOSIS — D696 Thrombocytopenia, unspecified: Secondary | ICD-10-CM

## 2013-03-05 DIAGNOSIS — D509 Iron deficiency anemia, unspecified: Secondary | ICD-10-CM

## 2013-03-05 DIAGNOSIS — R7989 Other specified abnormal findings of blood chemistry: Secondary | ICD-10-CM

## 2013-03-05 LAB — IRON AND TIBC CHCC
Iron: 150 ug/dL (ref 42–163)
TIBC: 246 ug/dL (ref 202–409)
UIBC: 96 ug/dL — ABNORMAL LOW (ref 117–376)

## 2013-03-05 LAB — CBC WITH DIFFERENTIAL (CANCER CENTER ONLY)
BASO#: 0 10*3/uL (ref 0.0–0.2)
Eosinophils Absolute: 0.2 10*3/uL (ref 0.0–0.5)
HGB: 16.8 g/dL (ref 13.0–17.1)
LYMPH#: 0.9 10*3/uL (ref 0.9–3.3)
LYMPH%: 16.5 % (ref 14.0–48.0)
MCH: 30.2 pg (ref 28.0–33.4)
MONO#: 0.3 10*3/uL (ref 0.1–0.9)
MONO%: 6 % (ref 0.0–13.0)
NEUT#: 3.8 10*3/uL (ref 1.5–6.5)
Platelets: 113 10*3/uL — ABNORMAL LOW (ref 145–400)
RBC: 5.56 10*6/uL (ref 4.20–5.70)
WBC: 5.2 10*3/uL (ref 4.0–10.0)

## 2013-03-05 LAB — FERRITIN CHCC: Ferritin: 85 ng/ml (ref 22–316)

## 2013-03-05 LAB — CHCC SATELLITE - SMEAR

## 2013-03-05 NOTE — Progress Notes (Signed)
This office note has been dictated.

## 2013-03-09 LAB — TRANSFERRIN RECEPTOR, SOLUABLE: Transferrin Receptor, Soluble: 1.13 mg/L (ref 0.76–1.76)

## 2013-03-09 LAB — ERYTHROPOIETIN: Erythropoietin: 9.3 m[IU]/mL (ref 2.6–18.5)

## 2013-03-14 ENCOUNTER — Other Ambulatory Visit: Payer: Self-pay | Admitting: Family Medicine

## 2013-03-16 NOTE — Telephone Encounter (Signed)
I don't see Zetia on his med list in epic or chart

## 2013-03-17 NOTE — Progress Notes (Signed)
CC:   Ernestina Penna, [name]  DIAGNOSES: 1. Chronic immune thrombocytopenia. 2. Possible polycythemia/erythrocytosis. 3. Possible iron-deficiency anemia.  CURRENT THERAPY:  Observation.  INTERIM HISTORY:  Mr. Salguero comes in for followup.  He is still complaining of some pruritus.  I am not sure as to why he has this.  Again, he may have polycythemia.  We have done all the genetic testing for this.  He is JAK2 negative.  He is, I think, BCR-ABL negative.  He has not had any kind of phlebotomies.  I told him that he probably could go and give blood.  We do not see any issue with him doing this. I do not see how this would be a problem.  I think this would really help him out.  He still has some fatigue.  We have not had to give him any iron for quite a while.  I know we have given him iron in the past.  I think the last time we gave him iron was back in April.  He has had no problems with fevers, sweats, or chills.  He has had no change in bowel or bladder habits.  He has had no cough.  He has had no nausea or vomiting.  PHYSICAL EXAMINATION:  General:  This is a well-developed, well- nourished white gentleman in no obvious distress.  Vital Signs: Temperature of 97.6, pulse 82, respiratory rate 18, blood pressure 132/73, weight is 228 pounds.  Head and Neck:  Normocephalic, atraumatic skull.  There are no ocular or oral lesions.  There are no palpable cervical or supraclavicular lymph nodes.  Lungs:  Clear bilaterally. Cardiac:  Regular rate and rhythm with a normal S1, S2.  There are no murmurs, rubs, or bruits.  Abdomen:  Soft.  He has good bowel sounds. There is no fluid wave.  There is no palpable abdominal mass.  There is no palpable hepatosplenomegaly.  Back:  No tenderness over the spine, ribs, or hips.  Extremities:  Show no clubbing, cyanosis or edema.  He has good range motion of the joints.  He has good muscle strength in upper and lower extremities.  Skin exam does  show a ruddy complexion. He does have a little bit of a facial plethora.  Neurological:  No focal neurological deficit.  LABORATORY STUDIES:  White cell count is 5.2, hemoglobin is 16.8, hematocrit 49.6, platelet count 113.  MCV is 89.  Ferritin is 85 with iron saturation of 21%.  His last B12 level was greater than 2000.  His peripheral smear looks pretty much unremarkable.  I looked at his blood smear.  I do not see any immature myeloid cells.  There are no nucleated red cells.  There are no teardrop cells.  I see no schistocytes or spherocytes.  Platelets are minimally decreased in number.  IMPRESSION:  Mr. Fix is a 67 year old gentleman with chronic immune thrombocytopenia.  He has a questionable polycythemia.  There definitely appears to be some component of erythrocytosis.  We did do a bone marrow biopsy on him.  We did do a JAK2 level on him.  His erythropoietin level has been very low.  Again, I told him to get phlebotomized.  I told him to give blood to the [facility].  I do not see any problems with him doing this.  Again, I have not found an obvious myeloproliferative neoplasm on him. One would have to suspect that there might be one, but again we have not been able to  prove it by any of our tests.  I am a little bit puzzled as to why his transferrin saturation is so high.  I guess we may have to check him for hemochromatosis now.  This has not been obvious in the past, but again this might be something that we could look into.  I want to see him back in another couple months or so.  Again, he says he will go to [facility] and donate.  Again, I do not see any problems with him trying to donate blood.    ______________________________ Josph Macho, [name] PRE/MEDQ  D:  03/05/2013  T:  03/16/2013  Job:  [id number]

## 2013-03-19 NOTE — Progress Notes (Signed)
CC:   Connor [name], [name]  DIAGNOSES: 1. Chronic immune thrombocytopenia -- stable. 2. Iron-deficiency anemia. 3. Questionable polycythemia.  CURRENT THERAPY:  Observation.  INTERIM HISTORY:  Connor [name] comes in for his followup.  He is doing okay, although he is getting tired again.  I am still not sure as to why he keeps getting tired.  He certainly does not appear to have any problems with iron deficiency. I think he got iron back in March.  He has had no headache.  He just gets tired easily.  He has had no joint swelling.  There has been no leg swelling.  He has had no abdominal pain.  There has been no change in bowel or bladder habits.  He still has the pruritus after showers.  This is just a "prickly" feeling.  Again, we have checked him for polycythemia.  We have done a bone marrow on him.  I am not sure at all if the polycythemia still may not be an issue here.  His JAK-2 assay was negative.  His erythropoietin level has been on the low side which may go along with underlying polycythemia.  I talked to him and his wife.  I told them that we could certainly phlebotomize him to get his hematocrit below 45%.  This certainly might help him out.  He is going to think about this.  PHYSICAL EXAMINATION:  General:  This is a well-developed, well- nourished white gentleman, in no obvious distress.  Vital Signs: Temperature of 97.6, pulse 65, respiratory rate 18, blood pressure 125/66.  Weight is 229.  Head and Neck:  Normocephalic, atraumatic skull.  He has no ocular or oral lesions.  There is some facial plethora.  He has no adenopathy in the neck.  Thyroid is nonpalpable. Lungs:  Clear bilaterally.  Cardiac:  Regular rate and rhythm with a normal S1 and S2.  There are no murmurs, rubs, or bruits.  Abdomen: Soft.  He has good bowel sounds.  There is no fluid wave.  There is no palpable hepatosplenomegaly.  Extremities:  No clubbing, cyanosis, or edema.  Skin:  A little  bit of a "ruddy" complexion.  LABORATORY STUDIES:  White cell count of 4.5, hemoglobin 16.4, hematocrit 47.8, platelet count is 115,000.  MCV is 90.  On his peripheral blood smear, which I reviewed, I saw a normochromic, normocytic population of red blood cells.  He has no nucleated red blood cells.  There are no teardrop cells.  I saw no rouleaux formation. White cells appear normal in morphology and maturation.  He has no immature myeloid or lymphoid forms.  Platelets are mildly decreased in number.  He has well-granulated platelets.  IMPRESSION:  Connor [name] is a 67 year old gentleman with chronic immune thrombocytopenia.  This is not a problem.  I think the bigger issue is this possible polycythemia.  Again, he has this aqua pruritus.  This is often a fairly decent indicator of polycythemia.  Again, even though he is JAK-2 negative, he has a very low erythropoietin level, so again phlebotomizing him may be an idea.  It is possible that he may be feeling tired because he has some increased viscosity.  He says he wants to donate blood.  I told him I do not see a problem with this.  I want to see him back in 2 months' time.    ______________________________ Josph Macho, [name] PRE/MEDQ  D:  01/01/2013  T:  03/18/2013  Job:  [id number]

## 2013-04-12 HISTORY — PX: OTHER SURGICAL HISTORY: SHX169

## 2013-04-19 ENCOUNTER — Ambulatory Visit (HOSPITAL_BASED_OUTPATIENT_CLINIC_OR_DEPARTMENT_OTHER): Payer: Medicare Other | Admitting: Hematology & Oncology

## 2013-04-19 ENCOUNTER — Encounter: Payer: Self-pay | Admitting: Hematology & Oncology

## 2013-04-19 ENCOUNTER — Ambulatory Visit (HOSPITAL_BASED_OUTPATIENT_CLINIC_OR_DEPARTMENT_OTHER): Payer: Medicare Other

## 2013-04-19 ENCOUNTER — Other Ambulatory Visit (HOSPITAL_BASED_OUTPATIENT_CLINIC_OR_DEPARTMENT_OTHER): Payer: Medicare Other | Admitting: Lab

## 2013-04-19 VITALS — BP 135/70 | HR 85 | Temp 98.0°F | Resp 18 | Ht 70.0 in | Wt 226.0 lb

## 2013-04-19 DIAGNOSIS — D45 Polycythemia vera: Secondary | ICD-10-CM | POA: Diagnosis not present

## 2013-04-19 DIAGNOSIS — D509 Iron deficiency anemia, unspecified: Secondary | ICD-10-CM

## 2013-04-19 DIAGNOSIS — D693 Immune thrombocytopenic purpura: Secondary | ICD-10-CM

## 2013-04-19 DIAGNOSIS — D696 Thrombocytopenia, unspecified: Secondary | ICD-10-CM

## 2013-04-19 DIAGNOSIS — R7989 Other specified abnormal findings of blood chemistry: Secondary | ICD-10-CM

## 2013-04-19 HISTORY — DX: Polycythemia vera: D45

## 2013-04-19 LAB — CBC WITH DIFFERENTIAL (CANCER CENTER ONLY)
BASO#: 0 10*3/uL (ref 0.0–0.2)
BASO%: 0.5 % (ref 0.0–2.0)
EOS ABS: 0.2 10*3/uL (ref 0.0–0.5)
EOS%: 2.2 % (ref 0.0–7.0)
HCT: 49.1 % (ref 38.7–49.9)
HGB: 16.2 g/dL (ref 13.0–17.1)
LYMPH#: 1 10*3/uL (ref 0.9–3.3)
LYMPH%: 12.1 % — ABNORMAL LOW (ref 14.0–48.0)
MCH: 29.9 pg (ref 28.0–33.4)
MCHC: 33 g/dL (ref 32.0–35.9)
MCV: 91 fL (ref 82–98)
MONO#: 0.4 10*3/uL (ref 0.1–0.9)
MONO%: 5.4 % (ref 0.0–13.0)
NEUT#: 6.3 10*3/uL (ref 1.5–6.5)
NEUT%: 79.8 % (ref 40.0–80.0)
Platelets: 134 10*3/uL — ABNORMAL LOW (ref 145–400)
RBC: 5.41 10*6/uL (ref 4.20–5.70)
RDW: 12.6 % (ref 11.1–15.7)
WBC: 7.8 10*3/uL (ref 4.0–10.0)

## 2013-04-19 LAB — CHCC SATELLITE - SMEAR

## 2013-04-19 LAB — IRON AND TIBC CHCC
%SAT: 38 % (ref 20–55)
Iron: 109 ug/dL (ref 42–163)
TIBC: 285 ug/dL (ref 202–409)
UIBC: 176 ug/dL (ref 117–376)

## 2013-04-19 LAB — FERRITIN CHCC: Ferritin: 54 ng/ml (ref 22–316)

## 2013-04-19 NOTE — Progress Notes (Signed)
This office note has been dictated.

## 2013-04-19 NOTE — Patient Instructions (Signed)

## 2013-04-19 NOTE — Progress Notes (Signed)
Connor [name] presents today for phlebotomy per MD orders. Phlebotomy procedure started at 1055and ended at 1105 Approximately 500 mls removed. Patient observed for 30 minutes after procedure without any incident. Patient tolerated procedure well. IV needle removed intact.

## 2013-04-20 NOTE — Progress Notes (Signed)
CC:   Connor [name], [name]  DIAGNOSES: 1. Polycythemia vera -- JAK2, negative. 2. Chronic immune thrombocytopenia.  CURRENT THERAPY: 1. Phlebotomy to maintain hematocrit below 45%. 2. Aspirin 81 mg p.o. daily.  INTERIM HISTORY:  Connor [name] comes in for followup.  He does feel a little better.  He wanted to get records of his blood taken. He said after this, he did feel a little bit better.  I really believe that, we have to be a little more aggressive with his phlebotomies.  I told him that the __________ will take blood every 3 months.  We will go ahead and phlebotomize him today.  He still has occasional bouts of pruritus.  He has had no fever.  His wife is getting over a viral syndrome right now.  He has had not as much fatigue.  He has had no cough or shortness of breath.  When we last saw him, his ferritin was 85 with an iron saturation of 61%.  PHYSICAL EXAM:  General:  This is a well-developed, well-nourished white gentleman, in no obvious distress.  Vital Signs:  Temperature of 98, pulse 85, respiratory rate 18, blood pressure 135/70.  Weight is 226 pounds.  Head and Neck:  Normocephalic, atraumatic skull.  There are no ocular or oral lesions.  There are no palpable, cervical, or supraclavicular lymph nodes.  Lungs:  Clear bilaterally.  Cardiac: Regular rate and rhythm with normal S1, S2.  There are no murmurs, rubs, or bruits.  Abdomen:  Soft.  He has good bowel sounds.  There is no fluid wave.  There is no palpable abdominal mass.  There is no palpable hepatosplenomegaly.  Back:  No tenderness of the spine, ribs, or hips. Extremities:  No clubbing, cyanosis, or edema.  Skin: Does show a ruddy complexion.  LABORATORY STUDIES:  White cell count 7.8, hemoglobin 16.2, hematocrit 49.1, platelet count 134.  MCV is 91.  His albumin level of 9.3.  IMPRESSION:  Connor [name] is a 68 year old gentleman.  I truly believe that he does have polycythemia.  As such, we will  have to get his hematocrit below 45%.  We will go ahead and plan to get him back to see [place] in an another month. I want to make sure that we are aggressive.    ______________________________ Volanda Napoleon, [name] PRE/MEDQ  D:  04/19/2013  T:  04/20/2013  Job:  [id number]

## 2013-04-23 LAB — HEMOCHROMATOSIS DNA-PCR(C282Y,H63D): DNA Mutation Analysis: NOT DETECTED

## 2013-04-24 ENCOUNTER — Telehealth: Payer: Self-pay | Admitting: Pulmonary Disease

## 2013-04-24 DIAGNOSIS — G4733 Obstructive sleep apnea (adult) (pediatric): Secondary | ICD-10-CM

## 2013-04-24 NOTE — Telephone Encounter (Signed)
Spoke with spouse. She reports pt is needing an order sent to [facility] for pt to get new CPAP supplies. I advised will do so. Nothing further needed and order sent

## 2013-04-26 DIAGNOSIS — M7710 Lateral epicondylitis, unspecified elbow: Secondary | ICD-10-CM | POA: Diagnosis not present

## 2013-04-26 DIAGNOSIS — M7989 Other specified soft tissue disorders: Secondary | ICD-10-CM | POA: Diagnosis not present

## 2013-05-01 DIAGNOSIS — Z85828 Personal history of other malignant neoplasm of skin: Secondary | ICD-10-CM | POA: Diagnosis not present

## 2013-05-17 ENCOUNTER — Encounter: Payer: Self-pay | Admitting: Hematology & Oncology

## 2013-05-17 ENCOUNTER — Ambulatory Visit (HOSPITAL_BASED_OUTPATIENT_CLINIC_OR_DEPARTMENT_OTHER): Payer: Medicare Other

## 2013-05-17 ENCOUNTER — Ambulatory Visit (HOSPITAL_BASED_OUTPATIENT_CLINIC_OR_DEPARTMENT_OTHER): Payer: Medicare Other | Admitting: Hematology & Oncology

## 2013-05-17 ENCOUNTER — Other Ambulatory Visit (HOSPITAL_BASED_OUTPATIENT_CLINIC_OR_DEPARTMENT_OTHER): Payer: Medicare Other | Admitting: Lab

## 2013-05-17 VITALS — BP 146/66 | HR 77 | Temp 97.3°F | Resp 18 | Ht 70.0 in | Wt 234.0 lb

## 2013-05-17 VITALS — BP 115/66 | HR 69 | Resp 18

## 2013-05-17 DIAGNOSIS — D45 Polycythemia vera: Secondary | ICD-10-CM

## 2013-05-17 DIAGNOSIS — D696 Thrombocytopenia, unspecified: Secondary | ICD-10-CM | POA: Diagnosis not present

## 2013-05-17 DIAGNOSIS — D693 Immune thrombocytopenic purpura: Secondary | ICD-10-CM

## 2013-05-17 LAB — CBC WITH DIFFERENTIAL (CANCER CENTER ONLY)
BASO#: 0 10*3/uL (ref 0.0–0.2)
BASO%: 0.6 % (ref 0.0–2.0)
EOS%: 3.2 % (ref 0.0–7.0)
Eosinophils Absolute: 0.2 10*3/uL (ref 0.0–0.5)
HEMATOCRIT: 46.6 % (ref 38.7–49.9)
HGB: 15.3 g/dL (ref 13.0–17.1)
LYMPH#: 0.8 10*3/uL — ABNORMAL LOW (ref 0.9–3.3)
LYMPH%: 16.6 % (ref 14.0–48.0)
MCH: 29.8 pg (ref 28.0–33.4)
MCHC: 32.8 g/dL (ref 32.0–35.9)
MCV: 91 fL (ref 82–98)
MONO#: 0.3 10*3/uL (ref 0.1–0.9)
MONO%: 6.7 % (ref 0.0–13.0)
NEUT#: 3.7 10*3/uL (ref 1.5–6.5)
NEUT%: 72.9 % (ref 40.0–80.0)
Platelets: 127 10*3/uL — ABNORMAL LOW (ref 145–400)
RBC: 5.14 10*6/uL (ref 4.20–5.70)
RDW: 12.8 % (ref 11.1–15.7)
WBC: 5.1 10*3/uL (ref 4.0–10.0)

## 2013-05-17 LAB — COMPREHENSIVE METABOLIC PANEL
ALBUMIN: 4.2 g/dL (ref 3.5–5.2)
ALT: 19 U/L (ref 0–53)
AST: 26 U/L (ref 0–37)
Alkaline Phosphatase: 63 U/L (ref 39–117)
BUN: 18 mg/dL (ref 6–23)
CHLORIDE: 105 meq/L (ref 96–112)
CO2: 25 meq/L (ref 19–32)
Calcium: 9.3 mg/dL (ref 8.4–10.5)
Creatinine, Ser: 0.89 mg/dL (ref 0.50–1.35)
GLUCOSE: 92 mg/dL (ref 70–99)
Potassium: 4.1 mEq/L (ref 3.5–5.3)
SODIUM: 139 meq/L (ref 135–145)
Total Bilirubin: 0.7 mg/dL (ref 0.2–1.2)
Total Protein: 6.3 g/dL (ref 6.0–8.3)

## 2013-05-17 LAB — IRON AND TIBC CHCC
%SAT: 34 % (ref 20–55)
Iron: 105 ug/dL (ref 42–163)
TIBC: 308 ug/dL (ref 202–409)
UIBC: 203 ug/dL (ref 117–376)

## 2013-05-17 LAB — FERRITIN CHCC: Ferritin: 29 ng/ml (ref 22–316)

## 2013-05-17 LAB — LACTATE DEHYDROGENASE: LDH: 109 U/L (ref 94–250)

## 2013-05-17 LAB — CHCC SATELLITE - SMEAR

## 2013-05-17 NOTE — Progress Notes (Signed)
Connor [name] presents today for phlebotomy per MD orders. Phlebotomy procedure started at 1130 and ended at 1150. 500 ml removed. Patient observed for 30 minutes after procedure without any incident. Patient tolerated procedure well. IV needle removed intact.

## 2013-05-17 NOTE — Addendum Note (Signed)
Addended by: Neita Garnet A on: 05/17/2013 12:34 PM   Modules accepted: Orders

## 2013-05-17 NOTE — Progress Notes (Signed)
This office note has been dictated.

## 2013-05-17 NOTE — Patient Instructions (Signed)

## 2013-05-19 NOTE — Progress Notes (Signed)
CC:   [name], [name]  DIAGNOSES: 1. Polycythemia vera -- JAK2, negative. 2. Chronic immune thrombocytopenia.  CURRENT THERAPY: 1. Phlebotomy to maintain hematocrit below 45%. 2. Aspirin 81 mg p.o. daily.  INTERIM HISTORY:  Mr. Agerton comes in for followup.  He is doing okay. He is feeling a little bit better.  I think the more we phlebotomize him, he gets better "blood viscosity" and has better perfusion of his tissues.  He has had no fever.  He has had no bony pain.  He has had no nausea or vomiting.  He has had no change in bowel or bladder habits.  We are getting iron out of him now.  His last ferritin was 54 with an iron saturation of 38%.  PHYSICAL EXAMINATION:  General:  This is a well-developed, well- nourished white gentleman in no obvious distress.  Vital signs: Temperature of 97.3, pulse 77, respiratory rate 18, blood pressure 146/66.  Weight is 234 pounds.  Head and Neck:  Normocephalic, atraumatic skull.  There are no ocular or oral lesions.  There are no palpable, cervical, or supraclavicular lymph nodes.  Lungs:  Clear bilaterally.  Cardiac:  Regular rate and rhythm with a normal S1, S2. There are no murmurs, rubs, or bruits.  Abdomen.  Soft.  He has good bowel sounds.  There is no fluid wave.  There is no palpable abdominal mass.  There is no palpable hepatosplenomegaly.  Back:  No tenderness over the spine, ribs, or hips.  Extremities:  No clubbing, cyanosis, or edema.  Neurological:  No focal neurological deficits.  LABORATORY STUDIES:  White cell count is 5.1, hemoglobin 15.3, hematocrit 46.6, platelet count 127.  MCV is 91.  IMPRESSION:  Mr. Remedios is a nice 68 year old gentleman.  He has polycythemia.  He had a bone marrow biopsy done.  His erythropoietin level is quite low.  We will go ahead and phlebotomize him today.  I want to get his hematocrit below 45%.  We will go ahead and get him back in about I think 6 weeks now.  I think as we get iron  out of him, it is going to be harder for him to mount an erythropoietin response.    ______________________________ Volanda Napoleon, [name] PRE/MEDQ  D:  05/17/2013  T:  05/18/2013  Job:  [id number]

## 2013-05-23 ENCOUNTER — Telehealth: Payer: Self-pay | Admitting: Hematology & Oncology

## 2013-05-23 NOTE — Telephone Encounter (Signed)
Pt moved 3-19 to 3-23 °

## 2013-06-06 ENCOUNTER — Telehealth: Payer: Self-pay | Admitting: Pulmonary Disease

## 2013-06-06 DIAGNOSIS — G4733 Obstructive sleep apnea (adult) (pediatric): Secondary | ICD-10-CM

## 2013-06-06 NOTE — Telephone Encounter (Signed)
Received information from [facility] [phone], ext [phone]).  Pt needs new CPAP prescription.  Will have [facility] send script for pt to continue CPAP 10 cm H2O.

## 2013-06-06 NOTE — Telephone Encounter (Signed)
Order faxed to [facility]

## 2013-06-22 DIAGNOSIS — N138 Other obstructive and reflux uropathy: Secondary | ICD-10-CM | POA: Diagnosis not present

## 2013-06-22 DIAGNOSIS — N401 Enlarged prostate with lower urinary tract symptoms: Secondary | ICD-10-CM | POA: Diagnosis not present

## 2013-06-22 DIAGNOSIS — N139 Obstructive and reflux uropathy, unspecified: Secondary | ICD-10-CM | POA: Diagnosis not present

## 2013-06-28 ENCOUNTER — Other Ambulatory Visit: Payer: Self-pay | Admitting: Family Medicine

## 2013-06-28 ENCOUNTER — Other Ambulatory Visit: Payer: Medicare Other | Admitting: Lab

## 2013-06-28 ENCOUNTER — Ambulatory Visit: Payer: Medicare Other | Admitting: Hematology & Oncology

## 2013-07-02 ENCOUNTER — Ambulatory Visit (HOSPITAL_BASED_OUTPATIENT_CLINIC_OR_DEPARTMENT_OTHER): Payer: Medicare Other | Admitting: Hematology & Oncology

## 2013-07-02 ENCOUNTER — Ambulatory Visit (HOSPITAL_BASED_OUTPATIENT_CLINIC_OR_DEPARTMENT_OTHER): Payer: Medicare Other

## 2013-07-02 ENCOUNTER — Other Ambulatory Visit (HOSPITAL_BASED_OUTPATIENT_CLINIC_OR_DEPARTMENT_OTHER): Payer: Medicare Other | Admitting: Lab

## 2013-07-02 ENCOUNTER — Encounter: Payer: Self-pay | Admitting: Hematology & Oncology

## 2013-07-02 VITALS — BP 138/77 | HR 75 | Temp 97.2°F | Resp 18 | Ht 70.0 in | Wt 231.0 lb

## 2013-07-02 VITALS — BP 155/81 | HR 76 | Resp 18

## 2013-07-02 DIAGNOSIS — D45 Polycythemia vera: Secondary | ICD-10-CM | POA: Diagnosis not present

## 2013-07-02 DIAGNOSIS — D693 Immune thrombocytopenic purpura: Secondary | ICD-10-CM

## 2013-07-02 DIAGNOSIS — D696 Thrombocytopenia, unspecified: Secondary | ICD-10-CM

## 2013-07-02 DIAGNOSIS — D509 Iron deficiency anemia, unspecified: Secondary | ICD-10-CM

## 2013-07-02 LAB — CBC WITH DIFFERENTIAL (CANCER CENTER ONLY)
BASO#: 0 10*3/uL (ref 0.0–0.2)
BASO%: 0.4 % (ref 0.0–2.0)
EOS%: 3.3 % (ref 0.0–7.0)
Eosinophils Absolute: 0.2 10*3/uL (ref 0.0–0.5)
HCT: 48.5 % (ref 38.7–49.9)
HGB: 16 g/dL (ref 13.0–17.1)
LYMPH#: 0.9 10*3/uL (ref 0.9–3.3)
LYMPH%: 15.9 % (ref 14.0–48.0)
MCH: 29.4 pg (ref 28.0–33.4)
MCHC: 33 g/dL (ref 32.0–35.9)
MCV: 89 fL (ref 82–98)
MONO#: 0.3 10*3/uL (ref 0.1–0.9)
MONO%: 5.4 % (ref 0.0–13.0)
NEUT%: 75 % (ref 40.0–80.0)
NEUTROS ABS: 4.2 10*3/uL (ref 1.5–6.5)
PLATELETS: 153 10*3/uL (ref 145–400)
RBC: 5.45 10*6/uL (ref 4.20–5.70)
RDW: 12.7 % (ref 11.1–15.7)
WBC: 5.5 10*3/uL (ref 4.0–10.0)

## 2013-07-02 NOTE — Patient Instructions (Signed)

## 2013-07-02 NOTE — Progress Notes (Signed)
Hematology and Oncology Follow Up Visit  Connor [name] [id number] [birth year] 68 y.o. 07/02/2013   Principle Diagnosis:  Polycythemia vera -- JAK2, negative. 2. Chronic immune thrombocytopenia.  Current Therapy:    Phlebotomy to maintain hematocrit below 45%  Aspirin 81 mg by mouth daily     Interim History:  Mr.  Connor [name] is back for followup. He's doing okay. He does feel a little more tired right now.  He's had no bleeding. He's had no fever. He still has to rise after taking a shower.  We are doing good job getting iron out of him. We last saw him they were, his ferritin was 29 and iron saturation of 34%.  He's had no problems with bowels or bladder. He's had no cough. He his wife did have a "stomach bug".  Medications: Current outpatient prescriptions:alfuzosin (UROXATRAL) 10 MG 24 hr tablet, Take 10 mg by mouth every morning. , Disp: , Rfl: ;  aspirin 81 MG tablet, Take 81 mg by mouth every morning. , Disp: , Rfl: ;  celecoxib (CELEBREX) 200 MG capsule, TAKE 1 CAPSULE (200 MG TOTAL) BY MOUTH DAILY., Disp: 30 capsule, Rfl: 2;  Cholecalciferol (VITAMIN D-3) 5000 UNITS TABS, Take 1 tablet by mouth daily. , Disp: , Rfl:  Coenzyme Q10 (CO Q 10 PO), Take 1 capsule by mouth daily. , Disp: , Rfl: ;  fish oil-omega-3 fatty acids 1000 MG capsule, Take 2 g by mouth daily. , Disp: , Rfl: ;  Glucosamine-Chondroit-Vit C-Mn (GLUCOSAMINE CHONDR 1500 COMPLX) CAPS, Take by mouth every morning., Disp: , Rfl: ;  Ipratropium-Albuterol (COMBIVENT) 20-100 MCG/ACT AERS respimat, Inhale 1 puff into the lungs as needed for wheezing or shortness of breath., Disp: , Rfl:  Multiple Vitamins-Minerals (CENTRUM SILVER ULTRA MENS) TABS, Take 1 tablet by mouth every morning. Once a day, Disp: , Rfl: ;  polyethylene glycol (MIRALAX / GLYCOLAX) packet, Take 17 g by mouth as needed. , Disp: , Rfl: ;  Probiotic Product ([place]), Take 1 capsule by mouth as needed. , Disp: , Rfl: ;  pyridOXINE (VITAMIN B-6)  100 MG tablet, Take 100 mg by mouth daily. , Disp: , Rfl:  UNABLE TO FIND, Take by mouth every morning. TURMERIC  2 tab daily, Disp: , Rfl: ;  ZETIA 10 MG tablet, TAKE 1 TABLET (10 MG TOTAL) BY MOUTH DAILY., Disp: 30 tablet, Rfl: 3  Allergies:  Allergies  Allergen Reactions  . Dexlansoprazole Diarrhea and Nausea Only    Past Medical History, Surgical history, Social history, and Family History were reviewed and updated.  Review of Systems: As above   Physical Exam:  height is 5\' 10"  (1.778 m) and weight is 231 lb (104.781 kg). His oral temperature is 97.2 F (36.2 C). His blood pressure is 138/77 and his pulse is 75. His respiration is 18.   Lungs are clear. Cardiac exam regular in rhythm. Head and neck exam shows no adenopathy. He has no scleral icterus. He has some facial plethora. Abdomen is soft. Has good bowel sounds. There is no fluid wave. There is no palpable liver or spleen tip. Extremities shows no clubbing cyanosis or edema. Neurological exam no focal neurological deficits. Skin exam does show a little bit of a "ruddy" complexion.   Lab Results  Component Value Date   WBC 5.5 07/02/2013   HGB 16.0 07/02/2013   HCT 48.5 07/02/2013   MCV 89 07/02/2013   PLT 153 07/02/2013     Chemistry  Component Value Date/Time   NA 139 05/17/2013 1001   NA 144 01/29/2013 1012   K 4.1 05/17/2013 1001   CL 105 05/17/2013 1001   CO2 25 05/17/2013 1001   BUN 18 05/17/2013 1001   BUN 18 01/29/2013 1012   CREATININE 0.89 05/17/2013 1001   CREATININE 0.94 07/20/2012 0947      Component Value Date/Time   CALCIUM 9.3 05/17/2013 1001   ALKPHOS 63 05/17/2013 1001   AST 26 05/17/2013 1001   ALT 19 05/17/2013 1001   BILITOT 0.7 05/17/2013 1001         Impression and Plan: Connor [name] is 68 year old gentleman. He has polycythemia. We are do a good job in preventing complications. He was though he is JAK2 negative, I still feel that his bone marrow is "autonomous".  We will phlebotomize him today.  We  will get him back to see [place] in another month or so.   Volanda Napoleon, MD 3/23/201512:45 PM

## 2013-07-02 NOTE — Progress Notes (Signed)
Connor [name] presents today for phlebotomy per MD orders. Phlebotomy procedure started at 1250 and ended at 1257 grams removed. Patient observed for 30 minutes after procedure without any incident. Patient tolerated procedure well. IV needle removed intact.

## 2013-07-03 DIAGNOSIS — H109 Unspecified conjunctivitis: Secondary | ICD-10-CM | POA: Diagnosis not present

## 2013-07-03 LAB — IRON AND TIBC CHCC
%SAT: 29 % (ref 20–55)
Iron: 93 ug/dL (ref 42–163)
TIBC: 320 ug/dL (ref 202–409)
UIBC: 227 ug/dL (ref 117–376)

## 2013-07-03 LAB — FERRITIN CHCC: FERRITIN: 19 ng/mL — AB (ref 22–316)

## 2013-07-19 DIAGNOSIS — H02409 Unspecified ptosis of unspecified eyelid: Secondary | ICD-10-CM | POA: Insufficient documentation

## 2013-07-19 DIAGNOSIS — H0230 Blepharochalasis unspecified eye, unspecified eyelid: Secondary | ICD-10-CM | POA: Diagnosis not present

## 2013-07-28 ENCOUNTER — Other Ambulatory Visit: Payer: Self-pay | Admitting: Family Medicine

## 2013-08-02 NOTE — Telephone Encounter (Signed)
Encounter Closed--TP 08/02/2013 

## 2013-08-06 ENCOUNTER — Ambulatory Visit (HOSPITAL_BASED_OUTPATIENT_CLINIC_OR_DEPARTMENT_OTHER): Payer: Medicare Other | Admitting: Hematology & Oncology

## 2013-08-06 ENCOUNTER — Encounter: Payer: Self-pay | Admitting: Hematology & Oncology

## 2013-08-06 ENCOUNTER — Ambulatory Visit (HOSPITAL_BASED_OUTPATIENT_CLINIC_OR_DEPARTMENT_OTHER): Payer: Medicare Other

## 2013-08-06 ENCOUNTER — Other Ambulatory Visit (HOSPITAL_BASED_OUTPATIENT_CLINIC_OR_DEPARTMENT_OTHER): Payer: Medicare Other | Admitting: Lab

## 2013-08-06 VITALS — BP 148/76 | HR 62 | Temp 97.5°F | Resp 18 | Ht 71.0 in | Wt 232.0 lb

## 2013-08-06 DIAGNOSIS — D45 Polycythemia vera: Secondary | ICD-10-CM

## 2013-08-06 DIAGNOSIS — D509 Iron deficiency anemia, unspecified: Secondary | ICD-10-CM

## 2013-08-06 DIAGNOSIS — D696 Thrombocytopenia, unspecified: Secondary | ICD-10-CM

## 2013-08-06 LAB — CBC WITH DIFFERENTIAL (CANCER CENTER ONLY)
BASO#: 0 10*3/uL (ref 0.0–0.2)
BASO%: 0.4 % (ref 0.0–2.0)
EOS%: 3.4 % (ref 0.0–7.0)
Eosinophils Absolute: 0.2 10*3/uL (ref 0.0–0.5)
HCT: 45.7 % (ref 38.7–49.9)
HGB: 15 g/dL (ref 13.0–17.1)
LYMPH#: 1 10*3/uL (ref 0.9–3.3)
LYMPH%: 18.2 % (ref 14.0–48.0)
MCH: 29 pg (ref 28.0–33.4)
MCHC: 32.8 g/dL (ref 32.0–35.9)
MCV: 88 fL (ref 82–98)
MONO#: 0.4 10*3/uL (ref 0.1–0.9)
MONO%: 7.1 % (ref 0.0–13.0)
NEUT#: 3.7 10*3/uL (ref 1.5–6.5)
NEUT%: 70.9 % (ref 40.0–80.0)
PLATELETS: 128 10*3/uL — AB (ref 145–400)
RBC: 5.17 10*6/uL (ref 4.20–5.70)
RDW: 12.8 % (ref 11.1–15.7)
WBC: 5.2 10*3/uL (ref 4.0–10.0)

## 2013-08-06 LAB — IRON AND TIBC CHCC
%SAT: 21 % (ref 20–55)
Iron: 76 ug/dL (ref 42–163)
TIBC: 355 ug/dL (ref 202–409)
UIBC: 279 ug/dL (ref 117–376)

## 2013-08-06 LAB — FERRITIN CHCC: Ferritin: 12 ng/ml — ABNORMAL LOW (ref 22–316)

## 2013-08-06 LAB — CHCC SATELLITE - SMEAR

## 2013-08-06 NOTE — Patient Instructions (Signed)

## 2013-08-06 NOTE — Progress Notes (Signed)
Hematology and Oncology Follow Up Visit  Connor [name] [id number] 10-Oct-1945 68 y.o. 08/06/2013   Principle Diagnosis:   Polycythemia vera-JAK2 negative  Chronic immune thrombo-cytopenia  Current Therapy:    Phlebotomy to maintain hematocrit below 45%  Aspirin 81 mg by mouth daily     Interim History:  [name] is back for followup. He still feels a little tired. He still playing golf. He still a bemoan [place]. However, he still has a little more fatigue. Some of this might be a phlebotomizing him again his iron level down.  He's had no bleeding. He's had no fever. He still has his psoriasis. I tried him on some Pepcid. This did not seem to help.  He's had no cough. He's had no headache. He's had no leg or foot swelling. His last ferritin was down to 19 with a 20% iron saturation.    Medications: Current outpatient prescriptions:alfuzosin (UROXATRAL) 10 MG 24 hr tablet, Take 10 mg by mouth every morning. , Disp: , Rfl: ;  aspirin 81 MG tablet, Take 81 mg by mouth every morning. , Disp: , Rfl: ;  celecoxib (CELEBREX) 200 MG capsule, TAKE 1 CAPSULE (200 MG TOTAL) BY MOUTH DAILY., Disp: 30 capsule, Rfl: 2;  Cholecalciferol (VITAMIN D-3) 5000 UNITS TABS, Take 1 tablet by mouth daily. , Disp: , Rfl:  fish oil-omega-3 fatty acids 1000 MG capsule, Take 2 g by mouth daily. , Disp: , Rfl: ;  Glucosamine-Chondroit-Vit C-Mn (GLUCOSAMINE CHONDR 1500 COMPLX) CAPS, Take by mouth every morning., Disp: , Rfl: ;  Ipratropium-Albuterol (COMBIVENT) 20-100 MCG/ACT AERS respimat, Inhale 1 puff into the lungs as needed for wheezing or shortness of breath., Disp: , Rfl:  Multiple Vitamins-Minerals (CENTRUM SILVER ULTRA MENS) TABS, Take 1 tablet by mouth every morning. Once a day, Disp: , Rfl: ;  polyethylene glycol (MIRALAX / GLYCOLAX) packet, Take 17 g by mouth as needed. , Disp: , Rfl: ;  Probiotic Product ([place]), Take 1 capsule by mouth as needed. , Disp: , Rfl: ;  pyridOXINE  (VITAMIN B-6) 100 MG tablet, Take 100 mg by mouth daily. , Disp: , Rfl:  UNABLE TO FIND, Take by mouth every morning. TURMERIC  2 tab daily, Disp: , Rfl: ;  ZETIA 10 MG tablet, TAKE 1 TABLET (10 MG TOTAL) BY MOUTH DAILY., Disp: 30 tablet, Rfl: 0;  Coenzyme Q10 (CO Q 10 PO), Take 1 capsule by mouth daily. , Disp: , Rfl:   Allergies:  Allergies  Allergen Reactions  . Dexlansoprazole Diarrhea and Nausea Only    Past Medical History, Surgical history, Social history, and Family History were reviewed and updated.  Review of Systems: As above  Physical Exam:  height is 5\' 11"  (1.803 m) and weight is 232 lb (105.235 kg). His oral temperature is 97.5 F (36.4 C). His blood pressure is 148/76 and his pulse is 62. His respiration is 18.   Well-developed and well-nourished. There is no ocular or oral lesions. He has no facial plethora. There is no adenopathy in the neck. Lungs are clear. Cardiac exam regular in rhythm with no murmurs rubs or bruits. Abdomen is soft. He has good bowel sounds. There is no fluid wave. His spleen tip might be palpable at the left costal margin with deep inspiration. There is no palpable liver edge. Back exam no tenderness over the spine ribs or hips. Extremities shows no clubbing cyanosis or edema. There may be some arthritic changes in his knees. Skin exam shows  some slight increase in erythema on his arms and legs.  Lab Results  Component Value Date   WBC 5.2 08/06/2013   HGB 15.0 08/06/2013   HCT 45.7 08/06/2013   MCV 88 08/06/2013   PLT 128* 08/06/2013     Chemistry      Component Value Date/Time   NA 139 05/17/2013 1001   NA 144 01/29/2013 1012   K 4.1 05/17/2013 1001   CL 105 05/17/2013 1001   CO2 25 05/17/2013 1001   BUN 18 05/17/2013 1001   BUN 18 01/29/2013 1012   CREATININE 0.89 05/17/2013 1001   CREATININE 0.94 07/20/2012 0947      Component Value Date/Time   CALCIUM 9.3 05/17/2013 1001   ALKPHOS 63 05/17/2013 1001   AST 26 05/17/2013 1001   ALT 19 05/17/2013 1001    BILITOT 0.7 05/17/2013 1001         Impression and Plan: Connor [name] is a 68 year old gentleman with polycythemia. We are phlebotomizing him. They were doing a good job with this. We are making him iron deficient.  His iron studies are done today showed a ferritin of 12 with iron saturation of 21%.  We will go ahead and phlebotomize him. This should keep his hemoglobin down fairly well.  We will go ahead and plan to get him back in another 4-6 weeks.   Volanda Napoleon, MD 4/27/20153:19 PM

## 2013-08-06 NOTE — Progress Notes (Signed)
Connor [name] presents today for phlebotomy per MD orders. Phlebotomy procedure started at 1240 and ended at 1250. 500 grams removed. Patient observed for 30 minutes after procedure without any incident. Patient tolerated procedure well. IV needle removed intact.

## 2013-08-14 ENCOUNTER — Ambulatory Visit: Payer: Medicare Other | Admitting: Family Medicine

## 2013-08-27 ENCOUNTER — Other Ambulatory Visit (INDEPENDENT_AMBULATORY_CARE_PROVIDER_SITE_OTHER): Payer: Medicare Other

## 2013-08-27 DIAGNOSIS — I10 Essential (primary) hypertension: Secondary | ICD-10-CM | POA: Diagnosis not present

## 2013-08-27 DIAGNOSIS — E785 Hyperlipidemia, unspecified: Secondary | ICD-10-CM | POA: Diagnosis not present

## 2013-08-27 DIAGNOSIS — E559 Vitamin D deficiency, unspecified: Secondary | ICD-10-CM

## 2013-08-27 LAB — POCT CBC
GRANULOCYTE PERCENT: 75.3 % (ref 37–80)
HCT, POC: 47.5 % (ref 43.5–53.7)
Hemoglobin: 14.8 g/dL (ref 14.1–18.1)
Lymph, poc: 1.1 (ref 0.6–3.4)
MCH: 27.1 pg (ref 27–31.2)
MCHC: 31.1 g/dL — AB (ref 31.8–35.4)
MCV: 86.9 fL (ref 80–97)
MPV: 9.6 fL (ref 0–99.8)
PLATELET COUNT, POC: 129 10*3/uL — AB (ref 142–424)
POC Granulocyte: 3.8 (ref 2–6.9)
POC LYMPH PERCENT: 21.1 %L (ref 10–50)
RBC: 5.5 M/uL (ref 4.69–6.13)
RDW, POC: 12.9 %
WBC: 5 10*3/uL (ref 4.6–10.2)

## 2013-08-27 NOTE — Progress Notes (Signed)
Pt came in for lab  only 

## 2013-08-28 ENCOUNTER — Other Ambulatory Visit: Payer: Self-pay | Admitting: Family Medicine

## 2013-08-28 LAB — NMR, LIPOPROFILE
CHOLESTEROL: 133 mg/dL (ref 100–199)
HDL Cholesterol by NMR: 35 mg/dL — ABNORMAL LOW (ref >39)
HDL Particle Number: 27.6 umol/L — ABNORMAL LOW (ref >=30.5)
LDL Particle Number: 1301 nmol/L — ABNORMAL HIGH (ref <1000)
LDL Size: 19.9 nm (ref >20.5)
LDLC SERPL CALC-MCNC: 82 mg/dL (ref 0–99)
LP-IR SCORE: 56 — AB (ref <=45)
SMALL LDL PARTICLE NUMBER: 901 nmol/L — AB (ref <=527)
Triglycerides by NMR: 81 mg/dL (ref 0–149)

## 2013-08-28 LAB — BMP8+EGFR
BUN / CREAT RATIO: 22 (ref 10–22)
BUN: 20 mg/dL (ref 8–27)
CO2: 24 mmol/L (ref 18–29)
Calcium: 9.1 mg/dL (ref 8.6–10.2)
Chloride: 103 mmol/L (ref 97–108)
Creatinine, Ser: 0.92 mg/dL (ref 0.76–1.27)
GFR, EST AFRICAN AMERICAN: 98 mL/min/{1.73_m2} (ref >59)
GFR, EST NON AFRICAN AMERICAN: 85 mL/min/{1.73_m2} (ref >59)
Glucose: 104 mg/dL — ABNORMAL HIGH (ref 65–99)
Potassium: 4.3 mmol/L (ref 3.5–5.2)
Sodium: 142 mmol/L (ref 134–144)

## 2013-08-28 LAB — HEPATIC FUNCTION PANEL
ALT: 13 IU/L (ref 0–44)
AST: 17 IU/L (ref 0–40)
Albumin: 4.3 g/dL (ref 3.6–4.8)
Alkaline Phosphatase: 61 IU/L (ref 39–117)
BILIRUBIN DIRECT: 0.17 mg/dL (ref 0.00–0.40)
BILIRUBIN TOTAL: 0.7 mg/dL (ref 0.0–1.2)
TOTAL PROTEIN: 6 g/dL (ref 6.0–8.5)

## 2013-08-28 LAB — VITAMIN D 25 HYDROXY (VIT D DEFICIENCY, FRACTURES): VIT D 25 HYDROXY: 52.2 ng/mL (ref 30.0–100.0)

## 2013-09-06 ENCOUNTER — Other Ambulatory Visit (HOSPITAL_BASED_OUTPATIENT_CLINIC_OR_DEPARTMENT_OTHER): Payer: Medicare Other | Admitting: Lab

## 2013-09-06 ENCOUNTER — Ambulatory Visit (HOSPITAL_BASED_OUTPATIENT_CLINIC_OR_DEPARTMENT_OTHER): Payer: Medicare Other | Admitting: Hematology & Oncology

## 2013-09-06 ENCOUNTER — Encounter: Payer: Self-pay | Admitting: Hematology & Oncology

## 2013-09-06 ENCOUNTER — Ambulatory Visit: Payer: Medicare Other

## 2013-09-06 VITALS — BP 126/64 | HR 80 | Temp 97.6°F | Resp 18 | Ht 71.0 in | Wt 231.0 lb

## 2013-09-06 DIAGNOSIS — D509 Iron deficiency anemia, unspecified: Secondary | ICD-10-CM

## 2013-09-06 DIAGNOSIS — D45 Polycythemia vera: Secondary | ICD-10-CM | POA: Diagnosis not present

## 2013-09-06 LAB — CBC WITH DIFFERENTIAL (CANCER CENTER ONLY)
BASO#: 0 10*3/uL (ref 0.0–0.2)
BASO%: 0.6 % (ref 0.0–2.0)
EOS%: 2.8 % (ref 0.0–7.0)
Eosinophils Absolute: 0.1 10*3/uL (ref 0.0–0.5)
HCT: 43.2 % (ref 38.7–49.9)
HGB: 14.3 g/dL (ref 13.0–17.1)
LYMPH#: 1.1 10*3/uL (ref 0.9–3.3)
LYMPH%: 24 % (ref 14.0–48.0)
MCH: 28.8 pg (ref 28.0–33.4)
MCHC: 33.1 g/dL (ref 32.0–35.9)
MCV: 87 fL (ref 82–98)
MONO#: 0.4 10*3/uL (ref 0.1–0.9)
MONO%: 8 % (ref 0.0–13.0)
NEUT%: 64.6 % (ref 40.0–80.0)
NEUTROS ABS: 3 10*3/uL (ref 1.5–6.5)
Platelets: 125 10*3/uL — ABNORMAL LOW (ref 145–400)
RBC: 4.96 10*6/uL (ref 4.20–5.70)
RDW: 13 % (ref 11.1–15.7)
WBC: 4.6 10*3/uL (ref 4.0–10.0)

## 2013-09-06 LAB — COMPREHENSIVE METABOLIC PANEL
ALK PHOS: 55 U/L (ref 39–117)
ALT: 12 U/L (ref 0–53)
AST: 17 U/L (ref 0–37)
Albumin: 4.2 g/dL (ref 3.5–5.2)
BUN: 23 mg/dL (ref 6–23)
CHLORIDE: 109 meq/L (ref 96–112)
CO2: 26 mEq/L (ref 19–32)
CREATININE: 1.14 mg/dL (ref 0.50–1.35)
Calcium: 9.1 mg/dL (ref 8.4–10.5)
Glucose, Bld: 97 mg/dL (ref 70–99)
Potassium: 3.9 mEq/L (ref 3.5–5.3)
Sodium: 141 mEq/L (ref 135–145)
Total Bilirubin: 0.6 mg/dL (ref 0.2–1.2)
Total Protein: 5.9 g/dL — ABNORMAL LOW (ref 6.0–8.3)

## 2013-09-06 LAB — RETICULOCYTES (CHCC)
ABS RETIC: 40.2 10*3/uL (ref 19.0–186.0)
RBC.: 5.02 MIL/uL (ref 4.22–5.81)
RETIC CT PCT: 0.8 % (ref 0.4–2.3)

## 2013-09-06 NOTE — Progress Notes (Signed)
No phlebotomy required today due to hct <45. dph

## 2013-09-07 ENCOUNTER — Ambulatory Visit (INDEPENDENT_AMBULATORY_CARE_PROVIDER_SITE_OTHER): Payer: Medicare Other

## 2013-09-07 ENCOUNTER — Ambulatory Visit (INDEPENDENT_AMBULATORY_CARE_PROVIDER_SITE_OTHER): Payer: Medicare Other | Admitting: Family Medicine

## 2013-09-07 ENCOUNTER — Encounter: Payer: Self-pay | Admitting: Family Medicine

## 2013-09-07 VITALS — BP 136/75 | HR 89 | Temp 97.9°F | Ht 71.0 in | Wt 229.0 lb

## 2013-09-07 DIAGNOSIS — R6882 Decreased libido: Secondary | ICD-10-CM

## 2013-09-07 DIAGNOSIS — N400 Enlarged prostate without lower urinary tract symptoms: Secondary | ICD-10-CM | POA: Diagnosis not present

## 2013-09-07 DIAGNOSIS — Z23 Encounter for immunization: Secondary | ICD-10-CM | POA: Diagnosis not present

## 2013-09-07 DIAGNOSIS — Z8679 Personal history of other diseases of the circulatory system: Secondary | ICD-10-CM

## 2013-09-07 DIAGNOSIS — E785 Hyperlipidemia, unspecified: Secondary | ICD-10-CM

## 2013-09-07 DIAGNOSIS — E291 Testicular hypofunction: Secondary | ICD-10-CM

## 2013-09-07 DIAGNOSIS — I10 Essential (primary) hypertension: Secondary | ICD-10-CM

## 2013-09-07 DIAGNOSIS — G629 Polyneuropathy, unspecified: Secondary | ICD-10-CM

## 2013-09-07 DIAGNOSIS — G589 Mononeuropathy, unspecified: Secondary | ICD-10-CM

## 2013-09-07 DIAGNOSIS — E349 Endocrine disorder, unspecified: Secondary | ICD-10-CM

## 2013-09-07 LAB — IRON AND TIBC CHCC
%SAT: 19 % — AB (ref 20–55)
Iron: 69 ug/dL (ref 42–163)
TIBC: 356 ug/dL (ref 202–409)
UIBC: 287 ug/dL (ref 117–376)

## 2013-09-07 LAB — FERRITIN CHCC: FERRITIN: 8 ng/mL — AB (ref 22–316)

## 2013-09-07 NOTE — Progress Notes (Signed)
Subjective:    Patient ID: Connor [name], male    DOB: Nov 07, 1945, 68 y.o.   MRN: [id number]  HPI Patient comes in today for 6 month follow up on chronic medical conditions. He has not complaints. The patient comes in visit today with his right pleural he is followed by the urologist for his prostate and by the hematologist for his polycythemia and thrombocytopenia. He is up to date on his colonoscopies. His most recent lab reviewed with him today. He is to get a chest x-ray and to return and FOBT. The patient is also due to his Prevnar vaccine. He has a history of testosterone deficiency but has not been on any testosterone replacement for good wall. He also has a history of polycythemia. He will discuss the testosterone replacement with his hematologist. He also has his history of thrombocytopenia and the hematologist indicated that sometimes the statin drugs can be implicated in this. His cholesterol numbers are elevated and he will also discuss with the hematologist if we can restart a statin drug, preferably this time Lipitor.    Review of Systems  Constitutional: Negative.   HENT: Negative.   Eyes: Negative.   Respiratory: Positive for shortness of breath (episodes with exertion. Relates this to polycythemia. ).   Cardiovascular: Negative.   Gastrointestinal: Negative.   Endocrine: Negative.   Genitourinary: Negative.   Musculoskeletal: Negative.   Skin: Negative.   Allergic/Immunologic: Negative.   Neurological: Negative.   Hematological: Negative.   Psychiatric/Behavioral: Negative.        Objective:   Physical Exam  Nursing note and vitals reviewed. Constitutional: He is oriented to person, place, and time. He appears well-developed and well-nourished. No distress.  HENT:  Head: Normocephalic and atraumatic.  Right Ear: External ear normal.  Left Ear: External ear normal.  Nose: Nose normal.  Mouth/Throat: Oropharynx is clear and moist. No oropharyngeal exudate.    Eyes: Conjunctivae and EOM are normal. Pupils are equal, round, and reactive to light. Right eye exhibits no discharge. Left eye exhibits no discharge. No scleral icterus.  Neck: Normal range of motion. Neck supple. No thyromegaly present.  Cardiovascular: Normal rate, regular rhythm, normal heart sounds and intact distal pulses.   No murmur heard. Pulmonary/Chest: Effort normal and breath sounds normal. No respiratory distress. He has no wheezes. He has no rales. He exhibits no tenderness.  Abdominal: Soft. Bowel sounds are normal. He exhibits no mass. There is no tenderness. There is no rebound and no guarding.  Musculoskeletal: Normal range of motion. He exhibits no edema and no tenderness.  Lymphadenopathy:    He has no cervical adenopathy.  Neurological: He is alert and oriented to person, place, and time. He has normal reflexes. No cranial nerve deficit.  Skin: Skin is warm and dry. No rash noted. No erythema. No pallor.  Psychiatric: He has a normal mood and affect. His behavior is normal. Judgment and thought content normal.    BP 136/75  Pulse 89  Temp(Src) 97.9 F (36.6 C) (Oral)  Ht 5\' 11"  (1.803 m)  Wt 229 lb (103.874 kg)  BMI 31.95 kg/m2  [facility] reading (PRIMARY) by  Dr. Brunilda Payor x-ray --no active disease                                     Assessment & Plan:  1. Hx of cardiomegaly - DG Chest 2 View; Future  2. Essential hypertension,  benign  3. BPH (benign prostatic hyperplasia)  4. Other and unspecified hyperlipidemia  5. Neuropathy  6. Need for prophylactic vaccination and inoculation against unspecified single disease - Pneumococcal conjugate vaccine 13-valent  7. Testosterone deficiency  8. Decreased libido Patient Instructions  Continue current medications. Continue good therapeutic lifestyle changes which include good diet and exercise. Fall precautions discussed with patient. If an FOBT was given today- please return it to our front desk. If  you are over 69 years old - you may need Prevnar 22 or the adult Pneumonia vaccine.  Check with insurance about coverage of Prevnar Vaccine Please discuss testosterone replacement and statin treatment with the hematology   Arrie Senate MD

## 2013-09-07 NOTE — Progress Notes (Signed)
Hematology and Oncology Follow Up Visit  DERICK SEMINARA [id number] 1945/12/20 68 y.o. 09/07/2013   Principle Diagnosis:   Polycythemia vera-JAK2 negative  Chronic immune thrombo-cytopenia  Current Therapy:    Phlebotomy to maintain hematocrit below 45%  Aspirin 81 mg by mouth daily     Interim History:  Mr.  Geibel is back for followup. He still feels a little tired. He still playing golf. He still has some pruritis. This seems to happen again after he takes a shower. . he still has a little more fatigue. Some of this might be from phlebotomizing him in getting his iron level down. His last ferritin was 12.   He's had no bleeding. He's had no fever.  He's had no cough. He's had no headache. He's had no leg or foot swelling.    Medications: Current outpatient prescriptions:alfuzosin (UROXATRAL) 10 MG 24 hr tablet, Take 10 mg by mouth every morning. , Disp: , Rfl: ;  aspirin 81 MG tablet, Take 81 mg by mouth every morning. , Disp: , Rfl: ;  celecoxib (CELEBREX) 200 MG capsule, TAKE 1 CAPSULE (200 MG TOTAL) BY MOUTH DAILY., Disp: 30 capsule, Rfl: 2;  Cholecalciferol (VITAMIN D-3) 5000 UNITS TABS, Take 1 tablet by mouth daily. , Disp: , Rfl:  Coenzyme Q10 (CO Q 10 PO), Take 1 capsule by mouth daily. , Disp: , Rfl: ;  fish oil-omega-3 fatty acids 1000 MG capsule, Take 2 g by mouth daily. , Disp: , Rfl: ;  Glucosamine-Chondroit-Vit C-Mn (GLUCOSAMINE CHONDR 1500 COMPLX) CAPS, Take by mouth every morning., Disp: , Rfl: ;  Ipratropium-Albuterol (COMBIVENT) 20-100 MCG/ACT AERS respimat, Inhale 1 puff into the lungs as needed for wheezing or shortness of breath., Disp: , Rfl:  Multiple Vitamins-Minerals (CENTRUM SILVER ULTRA MENS) TABS, Take 1 tablet by mouth every morning. Once a day, Disp: , Rfl: ;  polyethylene glycol (MIRALAX / GLYCOLAX) packet, Take 17 g by mouth as needed. , Disp: , Rfl: ;  Probiotic Product ([place]), Take 1 capsule by mouth as needed. , Disp: , Rfl: ;   pyridOXINE (VITAMIN B-6) 100 MG tablet, Take 100 mg by mouth daily. , Disp: , Rfl:  Turmeric 500 MG CAPS, Take by mouth every morning., Disp: , Rfl: ;  ZETIA 10 MG tablet, TAKE 1 TABLET (10 MG TOTAL) BY MOUTH DAILY., Disp: 30 tablet, Rfl: 5  Allergies:  Allergies  Allergen Reactions  . Dexlansoprazole Diarrhea and Nausea Only    Past Medical History, Surgical history, Social history, and Family History were reviewed and updated.  Review of Systems: As above  Physical Exam:  height is 5\' 11"  (1.803 m) and weight is 231 lb (104.781 kg). His oral temperature is 97.6 F (36.4 C). His blood pressure is 126/64 and his pulse is 80. His respiration is 18.   Well-developed and well-nourished. There is no ocular or oral lesions. He has no facial plethora. There is no adenopathy in the neck. Lungs are clear. Cardiac exam regular in rhythm with no murmurs rubs or bruits. Abdomen is soft. He has good bowel sounds. There is no fluid wave. His spleen tip might be palpable at the left costal margin with deep inspiration. There is no palpable liver edge. Back exam no tenderness over the spine ribs or hips. Extremities shows no clubbing cyanosis or edema. There may be some arthritic changes in his knees. Skin exam shows some slight increase in erythema on his arms and legs.  Lab Results  Component Value Date  WBC 4.6 09/06/2013   HGB 14.3 09/06/2013   HCT 43.2 09/06/2013   MCV 87 09/06/2013   PLT 125* 09/06/2013     Chemistry      Component Value Date/Time   NA 141 09/06/2013 1350   NA 142 08/27/2013 0852   K 3.9 09/06/2013 1350   CL 109 09/06/2013 1350   CO2 26 09/06/2013 1350   BUN 23 09/06/2013 1350   BUN 20 08/27/2013 0852   CREATININE 1.14 09/06/2013 1350   CREATININE 0.94 07/20/2012 0947      Component Value Date/Time   CALCIUM 9.1 09/06/2013 1350   ALKPHOS 55 09/06/2013 1350   AST 17 09/06/2013 1350   ALT 12 09/06/2013 1350   BILITOT 0.6 09/06/2013 1350         Impression and Plan: Mr. Royce  is a 68 year old gentleman with polycythemia. We are phlebotomizing him. They were doing a good job with this. We are making him iron deficient.  He does not need to be phlebotomized today.  Am still not sure as to why he has the itching. His polycythemia to be under good control. I will check some other lab studies want to see him back.  We will plan to see him back in 4 weeks weeks   Volanda Napoleon, MD 5/29/20156:54 AM

## 2013-09-07 NOTE — Patient Instructions (Addendum)
Continue current medications. Continue good therapeutic lifestyle changes which include good diet and exercise. Fall precautions discussed with patient. If an FOBT was given today- please return it to our front desk. If you are over 70 years old - you may need Prevnar 83 or the adult Pneumonia vaccine.  Check with insurance about coverage of Prevnar Vaccine Please discuss testosterone replacement and statin treatment with the hematology  Pneumococcal Vaccine, Polyvalent suspension for injection What is this medicine? PNEUMOCOCCAL VACCINE, POLYVALENT (NEU mo KOK al vak SEEN, pol ee VEY luhnt) is a vaccine to prevent pneumococcus bacteria infection. These bacteria are a major cause of ear infections, 'Strep throat' infections, and serious pneumonia, meningitis, or blood infections worldwide. These vaccines help the body to produce antibodies (protective substances) that help your body defend against these bacteria. This vaccine is recommended for infants and young children. This vaccine will not treat an infection. This medicine may be used for other purposes; ask your health care provider or pharmacist if you have questions. COMMON BRAND NAME(S): Prevnar 13 , Prevnar What should I tell my health care provider before I take this medicine? They need to know if you have any of these conditions: -bleeding problems -fever -immune system problems -low platelet count in the blood -seizures -an unusual or allergic reaction to pneumococcal vaccine, diphtheria toxoid, other vaccines, latex, other medicines, foods, dyes, or preservatives -pregnant or trying to get pregnant -breast-feeding How should I use this medicine? This vaccine is for injection into a muscle. It is given by a health care professional. A copy of Vaccine Information Statements will be given before each vaccination. Read this sheet carefully each time. The sheet may change frequently. Talk to your pediatrician regarding the use of  this medicine in children. While this drug may be prescribed for children as young as 2 weeks old for selected conditions, precautions do apply. Overdosage: If you think you have taken too much of this medicine contact a poison control center or emergency room at once. NOTE: This medicine is only for you. Do not share this medicine with others. What if I miss a dose? It is important not to miss your dose. Call your doctor or health care professional if you are unable to keep an appointment. What may interact with this medicine? -medicines for cancer chemotherapy -medicines that suppress your immune function -medicines that treat or prevent blood clots like warfarin, enoxaparin, and dalteparin -steroid medicines like prednisone or cortisone This list may not describe all possible interactions. Give your health care provider a list of all the medicines, herbs, non-prescription drugs, or dietary supplements you use. Also tell them if you smoke, drink alcohol, or use illegal drugs. Some items may interact with your medicine. What should I watch for while using this medicine? Mild fever and pain should go away in 3 days or less. Report any unusual symptoms to your doctor or health care professional. What side effects may I notice from receiving this medicine? Side effects that you should report to your doctor or health care professional as soon as possible: -allergic reactions like skin rash, itching or hives, swelling of the face, lips, or tongue -breathing problems -confused -fever over 102 degrees F -pain, tingling, numbness in the hands or feet -seizures -unusual bleeding or bruising -unusual muscle weakness Side effects that usually do not require medical attention (report to your doctor or health care professional if they continue or are bothersome): -aches and pains -diarrhea -fever of 102 degrees F or less -headache -  irritable -loss of appetite -pain, tender at site where  injected -trouble sleeping This list may not describe all possible side effects. Call your doctor for medical advice about side effects. You may report side effects to FDA at 1-800-FDA-1088. Where should I keep my medicine? This does not apply. This vaccine is given in a clinic, pharmacy, doctor's office, or other health care setting and will not be stored at home. NOTE: This sheet is a summary. It may not cover all possible information. If you have questions about this medicine, talk to your doctor, pharmacist, or health care provider.  2014, Elsevier/Gold Standard. (2008-06-11 10:17:22)

## 2013-09-12 ENCOUNTER — Telehealth: Payer: Self-pay | Admitting: Family Medicine

## 2013-09-12 ENCOUNTER — Telehealth: Payer: Self-pay

## 2013-09-12 NOTE — Telephone Encounter (Signed)
Pt Aware of CXR results

## 2013-09-12 NOTE — Telephone Encounter (Signed)
Please call in amoxicillin 500 #30 one 3 times daily for infection until completed

## 2013-09-12 NOTE — Telephone Encounter (Signed)
Message copied by Koren Bound on Wed Sep 12, 2013  9:10 AM ------      Message from: [name]      Created: Fri Sep 07, 2013  5:29 PM       As per radiology report ------

## 2013-09-13 MED ORDER — AMOXICILLIN 500 MG PO CAPS: 500.0000 mg | ORAL_CAPSULE | Freq: Three times a day (TID) | ORAL | Status: DC

## 2013-09-13 NOTE — Telephone Encounter (Signed)
Called in prescription to requested pharmacy and left message on voicemail. Patient aware.

## 2013-09-25 ENCOUNTER — Other Ambulatory Visit: Payer: Self-pay | Admitting: Nurse Practitioner

## 2013-10-01 ENCOUNTER — Other Ambulatory Visit: Payer: Medicare Other

## 2013-10-01 DIAGNOSIS — Z1212 Encounter for screening for malignant neoplasm of rectum: Secondary | ICD-10-CM

## 2013-10-02 LAB — FECAL OCCULT BLOOD, IMMUNOCHEMICAL: Fecal Occult Bld: NEGATIVE

## 2013-10-15 ENCOUNTER — Telehealth: Payer: Self-pay | Admitting: Family Medicine

## 2013-10-15 NOTE — Telephone Encounter (Signed)
Appt given

## 2013-10-16 ENCOUNTER — Encounter: Payer: Self-pay | Admitting: Family Medicine

## 2013-10-16 ENCOUNTER — Ambulatory Visit (INDEPENDENT_AMBULATORY_CARE_PROVIDER_SITE_OTHER): Payer: Medicare Other | Admitting: Family Medicine

## 2013-10-16 VITALS — BP 148/84 | HR 71 | Temp 97.9°F | Ht 71.0 in | Wt 228.0 lb

## 2013-10-16 DIAGNOSIS — IMO0001 Reserved for inherently not codable concepts without codable children: Secondary | ICD-10-CM

## 2013-10-16 DIAGNOSIS — R030 Elevated blood-pressure reading, without diagnosis of hypertension: Secondary | ICD-10-CM

## 2013-10-16 DIAGNOSIS — D696 Thrombocytopenia, unspecified: Secondary | ICD-10-CM

## 2013-10-16 DIAGNOSIS — M25549 Pain in joints of unspecified hand: Secondary | ICD-10-CM

## 2013-10-16 NOTE — Patient Instructions (Signed)
Monitor blood pressures at home with both blood pressure monitor Increase water intake Hold the Celebrex Take Tylenol as needed for pain Walk and exercise more and try to lose 3-5 pounds

## 2013-10-16 NOTE — Progress Notes (Signed)
   Subjective:    Patient ID: Connor [name], male    DOB: December 13, 1945, 68 y.o.   MRN: [id number]  HPI Pt is here today due to elevated blood pressure readings.  He has had some dizziness with slight headache intermittently.sugar readings have been running in the 140s over the 90s for about 3 days. They're using an Om[employer] blood pressure cuff. He does take Celebrex. He indicates that there is minimal sodium in his diet. The headaches that he is having are sharp in nature and on one side. He has had some slight dizziness.   Review of Systems  Neurological: Positive for dizziness (slight) and headaches (intermitent).        Objective:   Physical Exam  Nursing note and vitals reviewed. Constitutional: He is oriented to person, place, and time. He appears well-developed and well-nourished. No distress.  HENT:  Head: Normocephalic and atraumatic.  Right Ear: External ear normal.  Left Ear: External ear normal.  Nose: Nose normal.  Mouth/Throat: Oropharynx is clear and moist. No oropharyngeal exudate.  Eyes: Conjunctivae and EOM are normal. Pupils are equal, round, and reactive to light. Right eye exhibits no discharge. Left eye exhibits no discharge. No scleral icterus.  Neck: Normal range of motion. Neck supple. No thyromegaly present.  Cardiovascular: Normal rate, regular rhythm, normal heart sounds and intact distal pulses.  Exam reveals no gallop and no friction rub.   No murmur heard. At 72 per minute  Pulmonary/Chest: Effort normal and breath sounds normal. No respiratory distress. He has no wheezes. He has no rales. He exhibits no tenderness.  Abdominal: Soft. Bowel sounds are normal. He exhibits no mass.  Musculoskeletal: Normal range of motion. He exhibits no edema.  Lymphadenopathy:    He has no cervical adenopathy.  Neurological: He is alert and oriented to person, place, and time.  Skin: Skin is warm and dry. No rash noted.  Psychiatric: He has a normal mood and affect. His  behavior is normal. Judgment and thought content normal.    BP 137/76  Pulse 71  Temp(Src) 97.9 F (36.6 C) (Oral)  Ht 5\' 11"  (1.803 m)  Wt 228 lb (103.42 kg)  BMI 31.81 kg/m2       Assessment & Plan:   1. Elevated blood pressure  2. Arthralgia of hand, unspecified laterality  3. Thrombocytopenia Patient Instructions  Monitor blood pressures at home with both blood pressure monitor Increase water intake Hold the Celebrex Take Tylenol as needed for pain Walk and exercise more and try to lose 3-5 pounds   Arrie Senate MD

## 2013-10-18 ENCOUNTER — Ambulatory Visit (HOSPITAL_BASED_OUTPATIENT_CLINIC_OR_DEPARTMENT_OTHER): Payer: Medicare Other | Admitting: Hematology & Oncology

## 2013-10-18 ENCOUNTER — Other Ambulatory Visit (HOSPITAL_BASED_OUTPATIENT_CLINIC_OR_DEPARTMENT_OTHER): Payer: Medicare Other | Admitting: Lab

## 2013-10-18 ENCOUNTER — Encounter: Payer: Self-pay | Admitting: Hematology & Oncology

## 2013-10-18 ENCOUNTER — Ambulatory Visit (HOSPITAL_BASED_OUTPATIENT_CLINIC_OR_DEPARTMENT_OTHER): Payer: Medicare Other

## 2013-10-18 VITALS — BP 123/62 | HR 68 | Temp 97.8°F | Resp 18 | Ht 70.0 in | Wt 227.0 lb

## 2013-10-18 VITALS — BP 113/68 | HR 75 | Temp 97.1°F | Resp 16

## 2013-10-18 DIAGNOSIS — D45 Polycythemia vera: Secondary | ICD-10-CM

## 2013-10-18 DIAGNOSIS — D751 Secondary polycythemia: Secondary | ICD-10-CM | POA: Diagnosis not present

## 2013-10-18 DIAGNOSIS — R5381 Other malaise: Secondary | ICD-10-CM | POA: Diagnosis not present

## 2013-10-18 DIAGNOSIS — R5383 Other fatigue: Secondary | ICD-10-CM | POA: Diagnosis not present

## 2013-10-18 DIAGNOSIS — D696 Thrombocytopenia, unspecified: Secondary | ICD-10-CM

## 2013-10-18 LAB — IRON AND TIBC CHCC
%SAT: 16 % — ABNORMAL LOW (ref 20–55)
Iron: 62 ug/dL (ref 42–163)
TIBC: 379 ug/dL (ref 202–409)
UIBC: 316 ug/dL (ref 117–376)

## 2013-10-18 LAB — FERRITIN CHCC: FERRITIN: 8 ng/mL — AB (ref 22–316)

## 2013-10-18 LAB — CBC WITH DIFFERENTIAL (CANCER CENTER ONLY)
BASO#: 0 10*3/uL (ref 0.0–0.2)
BASO%: 0.6 % (ref 0.0–2.0)
EOS ABS: 0.1 10*3/uL (ref 0.0–0.5)
EOS%: 2.7 % (ref 0.0–7.0)
HCT: 44.6 % (ref 38.7–49.9)
HEMOGLOBIN: 14.7 g/dL (ref 13.0–17.1)
LYMPH#: 1 10*3/uL (ref 0.9–3.3)
LYMPH%: 19.2 % (ref 14.0–48.0)
MCH: 28.3 pg (ref 28.0–33.4)
MCHC: 33 g/dL (ref 32.0–35.9)
MCV: 86 fL (ref 82–98)
MONO#: 0.5 10*3/uL (ref 0.1–0.9)
MONO%: 10.4 % (ref 0.0–13.0)
NEUT#: 3.5 10*3/uL (ref 1.5–6.5)
NEUT%: 67.1 % (ref 40.0–80.0)
Platelets: 122 10*3/uL — ABNORMAL LOW (ref 145–400)
RBC: 5.2 10*6/uL (ref 4.20–5.70)
RDW: 13.5 % (ref 11.1–15.7)
WBC: 5.2 10*3/uL (ref 4.0–10.0)

## 2013-10-18 LAB — IGE: IGE (IMMUNOGLOBULIN E), SERUM: 4.7 [IU]/mL (ref 0.0–180.0)

## 2013-10-18 LAB — CHCC SATELLITE - SMEAR

## 2013-10-18 NOTE — Patient Instructions (Signed)
Therapeutic Phlebotomy Care After Refer to this sheet in the next few weeks. These instructions provide you with information on caring for yourself after your procedure. Your caregiver may also give you more specific instructions. Your treatment has been planned according to current medical practices, but problems sometimes occur. Call your caregiver if you have any problems or questions after your procedure. HOME CARE INSTRUCTIONS Most people can go back to their normal activities right away. Before you leave, be sure to ask if there is anything you should or should not do. In general, it would be wise to:  Keep the bandage dry. You can remove the bandage after about 5 hours.  Eat well-balanced meals for the next 24 hours.  Drink enough fluids to keep your urine clear or pale yellow.  Avoid drinking alcohol minimally until after eating.  Avoid smoking for at least 30 minutes after the procedure.  Avoid strenous physical activity or heavy lifting or pulling for about 5 hours after the procedure.  Athletes should avoid strenous exercise for 12 hours or more.  Change positions slowly for the remainder of the day to prevent lightheadedness or fainting.  If you feel lightheaded, lie down until the feeling subsides.  If you have bleeding from the needle insertion site, elevate your arm and press firmly on the site until the bleeding stops.  If bruising or bleeding appears under the skin, apply ice to the area for 15 to 20 minutes, 3 to 4 times per day. Put the ice in a plastic bag and place a towel between the bag of ice and your skin. Do this while you are awake for the first 24 hours. The ice packs can be stopped before 24 hours if the swelling goes away. If swelling persists after 24 hours, a warm, moist washcloth can be applied to the area for 15 to 20 minutes, 3 to 4 times per day. The warm, moist treatments can be stopped when the swelling goes away.  It is important to continue further  therapeutic phlebotomy as directed by your caregiver. SEEK MEDICAL CARE IF:  There is bleeding or fluid leaking from the needle insertion site.  The needle insertion site becomes swollen, red, or sore.  You feel lightheaded, dizzy or nauseated, and the feeling does not go away.  You notice new bruising at the needle insertion site.  You feel more weak or tired than normal.  You develop a fever. SEEK IMMEDIATE MEDICAL CARE IF:   There is increased bleeding, pain, or swelling from the needle insertion site.  You have severe nausea or vomiting.  You have chest pain.  You have trouble breathing. MAKE SURE YOU:  Understand these instructions.  Will watch your condition.  Will get help right away if you are not doing well or get worse. Document Released: 08/31/2010 Document Revised: 06/21/2011 Document Reviewed: 08/31/2010 Exit[employer] Patient Information 2015 [employer]. This information is not intended to replace advice given to you by your health care provider. Make sure you discuss any questions you have with your health care provider.  

## 2013-10-18 NOTE — Progress Notes (Signed)
Connor [name] presents today for phlebotomy per MD orders. Phlebotomy procedure started at 1430 and ended at 1445. Approximately 500 ms removed. Patient observed for 30 minutes after procedure without any incident. Patient tolerated procedure well. IV needle removed intact.

## 2013-10-19 LAB — TRYPTASE: TRYPTASE: 2.9 ug/L (ref <11)

## 2013-10-24 ENCOUNTER — Encounter: Payer: Self-pay | Admitting: *Deleted

## 2013-10-25 NOTE — Progress Notes (Signed)
Hematology and Oncology Follow Up Visit  Connor [name] [id number] 06/01/1945 68 y.o. 10/25/2013   Principle Diagnosis:   Polycythemia vera-JAK2 negative  Chronic immune thrombo-cytopenia  Current Therapy:    Phlebotomy to maintain hematocrit below 45%  Aspirin 81 mg by mouth daily     Interim History:  Connor [name] is back for followup. He still feels a little tired. He still playing golf. He still has some pruritis. This seems to happen again after he takes a shower. . he still has a little more fatigue. Some of this might be from phlebotomizing him in getting his iron level down. His last ferritin was 8   He's had no bleeding. He's had no fever.  He's had no cough. He's had no headache. He's had no leg or foot swelling.    Medications: Current outpatient prescriptions:alfuzosin (UROXATRAL) 10 MG 24 hr tablet, Take 10 mg by mouth every morning. , Disp: , Rfl: ;  aspirin 81 MG tablet, Take 81 mg by mouth 2 (two) times daily. , Disp: , Rfl: ;  Cholecalciferol (VITAMIN D-3) 5000 UNITS TABS, Take 1 tablet by mouth daily. , Disp: , Rfl: ;  Coenzyme Q10 (CO Q 10 PO), Take 1 capsule by mouth daily. , Disp: , Rfl:  docusate sodium (PHILLIPS STOOL SOFTENER) 100 MG capsule, Take 100 mg by mouth as needed for mild constipation., Disp: , Rfl: ;  fish oil-omega-3 fatty acids 1000 MG capsule, Take 2 g by mouth daily. , Disp: , Rfl: ;  Glucosamine-Chondroit-Vit C-Mn (GLUCOSAMINE CHONDR 1500 COMPLX) CAPS, Take by mouth every morning., Disp: , Rfl:  Ipratropium-Albuterol (COMBIVENT) 20-100 MCG/ACT AERS respimat, Inhale 1 puff into the lungs as needed for wheezing or shortness of breath., Disp: , Rfl: ;  Multiple Vitamins-Minerals (CENTRUM SILVER ULTRA MENS) TABS, Take 1 tablet by mouth every morning. Once a day, Disp: , Rfl: ;  polyethylene glycol (MIRALAX / GLYCOLAX) packet, Take 17 g by mouth as needed. , Disp: , Rfl:  Probiotic Product ([place]), Take 1 capsule by mouth as needed. ,  Disp: , Rfl: ;  pyridOXINE (VITAMIN B-6) 100 MG tablet, Take 100 mg by mouth daily. , Disp: , Rfl: ;  Turmeric 500 MG CAPS, Take by mouth every morning., Disp: , Rfl: ;  ZETIA 10 MG tablet, TAKE 1 TABLET (10 MG TOTAL) BY MOUTH DAILY., Disp: 30 tablet, Rfl: 5;  celecoxib (CELEBREX) 200 MG capsule, 10-18-13   ON HOLD, Disp: , Rfl:   Allergies:  Allergies  Allergen Reactions  . Dexlansoprazole Diarrhea and Nausea Only    Past Medical History, Surgical history, Social history, and Family History were reviewed and updated.  Review of Systems: As above  Physical Exam:  height is 5\' 10"  (1.778 m) and weight is 227 lb (102.967 kg). His oral temperature is 97.8 F (36.6 C). His blood pressure is 123/62 and his pulse is 68. His respiration is 18.   Well-developed and well-nourished. There is no ocular or oral lesions. He has no facial plethora. There is no adenopathy in the neck. Lungs are clear. Cardiac exam regular in rhythm with no murmurs rubs or bruits. Abdomen is soft. He has good bowel sounds. There is no fluid wave. His spleen tip might be palpable at the left costal margin with deep inspiration. There is no palpable liver edge. Back exam no tenderness over the spine ribs or hips. Extremities shows no clubbing cyanosis or edema. There may be some arthritic changes in his knees.  Skin exam shows some slight increase in erythema on his arms and legs.  Lab Results  Component Value Date   WBC 5.2 10/18/2013   HGB 14.7 10/18/2013   HCT 44.6 10/18/2013   MCV 86 10/18/2013   PLT 122* 10/18/2013     Chemistry      Component Value Date/Time   NA 141 09/06/2013 1350   NA 142 08/27/2013 0852   K 3.9 09/06/2013 1350   CL 109 09/06/2013 1350   CO2 26 09/06/2013 1350   BUN 23 09/06/2013 1350   BUN 20 08/27/2013 0852   CREATININE 1.14 09/06/2013 1350   CREATININE 0.94 07/20/2012 0947      Component Value Date/Time   CALCIUM 9.1 09/06/2013 1350   ALKPHOS 55 09/06/2013 1350   AST 17 09/06/2013 1350   ALT 12  09/06/2013 1350   BILITOT 0.6 09/06/2013 1350     Ferritin is 8.  % Iron Saturation is 16%.  Iron is 62.  Tryptase is 2.9 (Normal)  Impression and Plan: Connor [name] is a 68 year old gentleman with polycythemia. We are phlebotomizing him. They were doing a good job with this. We are making him iron deficient.  He does  need to be phlebotomized today.  Am still not sure as to why he has the itching. His polycythemia to be under good control. I will check some other lab studies want to see him back.  We will plan to see him back in6 weeks weeks   Volanda Napoleon, MD 7/16/20157:06 AM

## 2013-11-05 ENCOUNTER — Ambulatory Visit: Payer: Medicare Other | Admitting: Family Medicine

## 2013-11-06 ENCOUNTER — Ambulatory Visit: Payer: Medicare Other | Admitting: Family Medicine

## 2013-11-18 IMAGING — CT CT ABD-PELV W/ CM
2 of 5 series · 16 of 46 positions shown, 18 images · IV contrast (APPLIED)
Comparison: CT abdomen dated 07/17/2008

CLINICAL DATA: Thrombocytopenia, history of polycythemia [place],
possible splenomegaly

CT ABDOMEN AND PELVIS WITH CONTRAST
TECHNIQUE: Multidetector CT imaging of the abdomen and pelvis was
performed following the standard protocol during bolus
administration of intravenous contrast.
Contrast: 100mL OMNIPAQUE IOHEXOL 300 MG/ML  SOLN

[Series 2: abd/pelvis 5.0 b31f · axial · 0.80mm/px · z∈[-541,-81]mm · 13 of 104 slices shown, 15 images]
[im 6/104  soft-tissue]
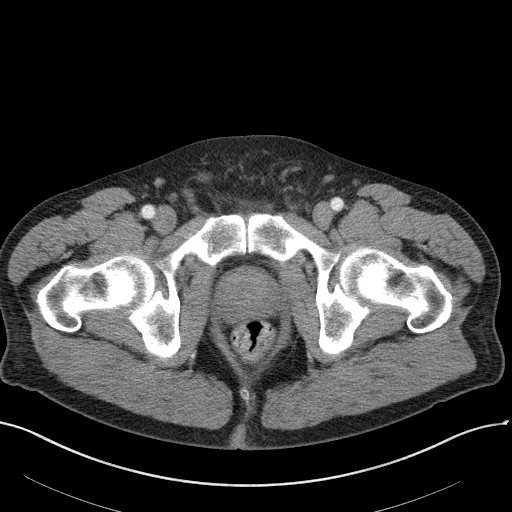
[im 6/104  bone]
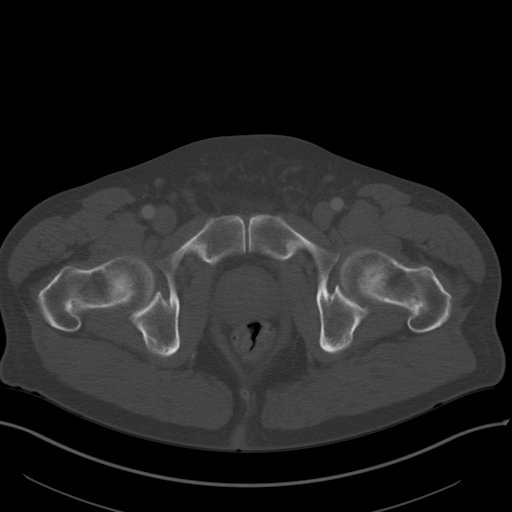
[im 12/104  soft-tissue]
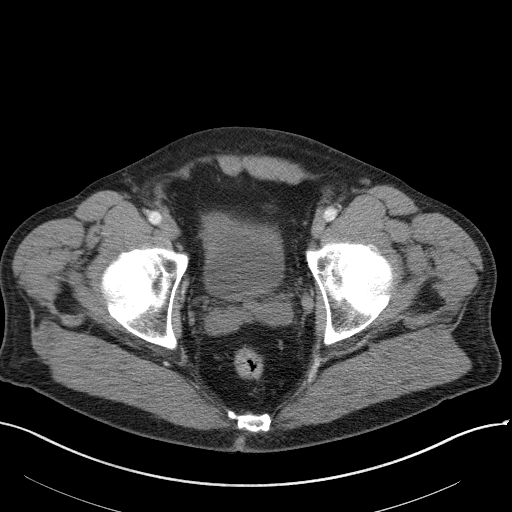
[im 23/104  soft-tissue]
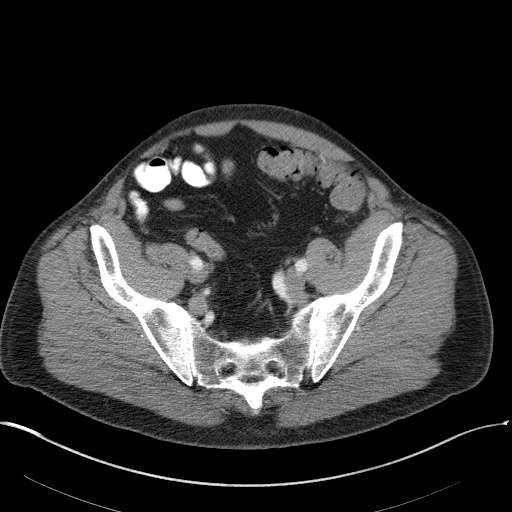
[im 29/104  soft-tissue]
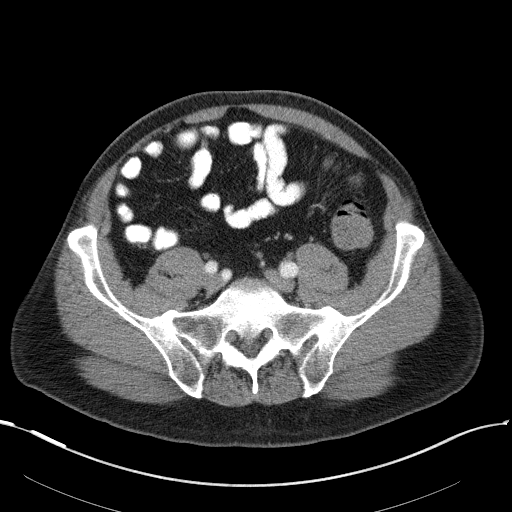
[im 35/104  soft-tissue]
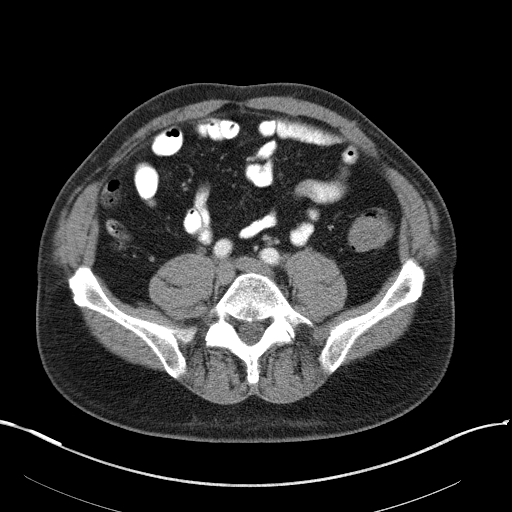
[im 46/104  soft-tissue]
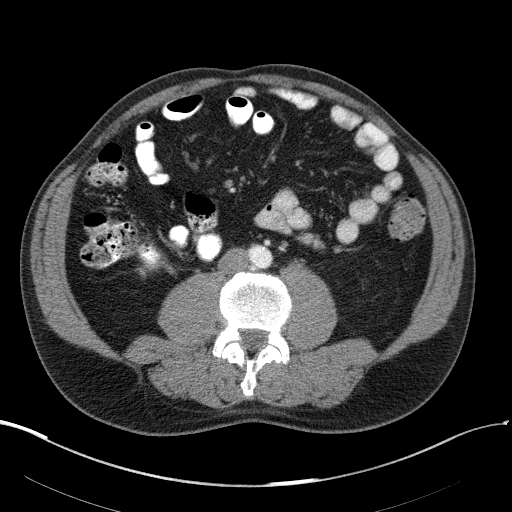
[im 52/104  soft-tissue]
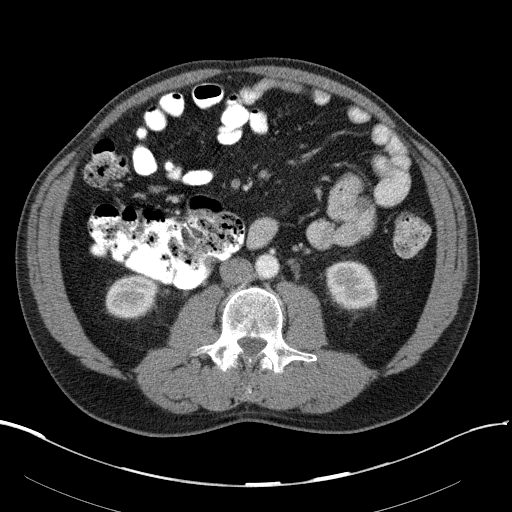
[im 58/104  soft-tissue]
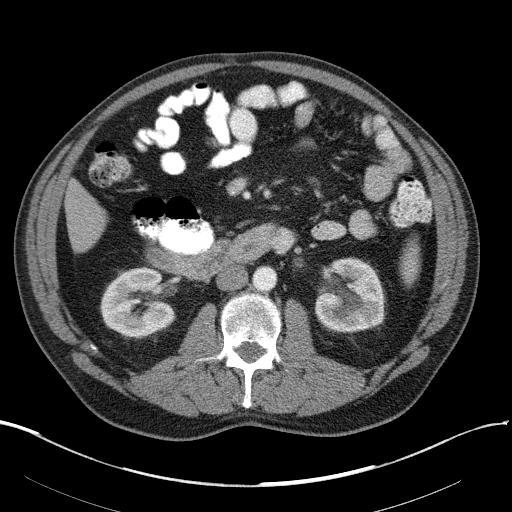
[im 69/104  soft-tissue]
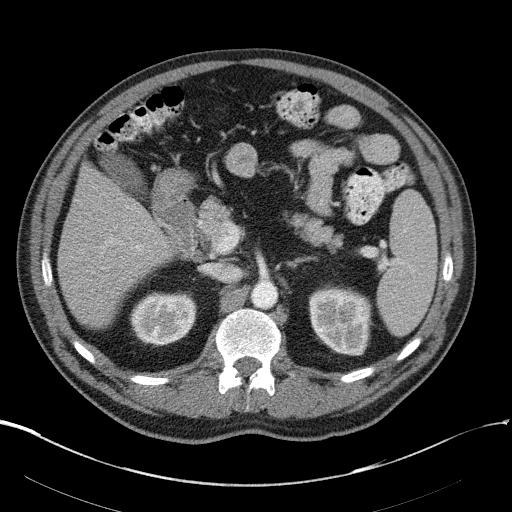
[im 69/104  bone]
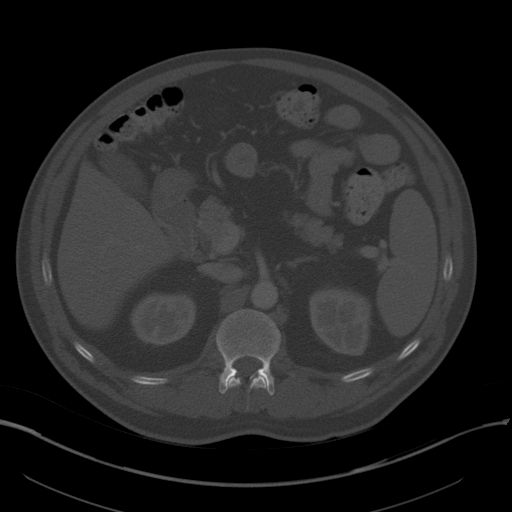
[im 75/104  soft-tissue]
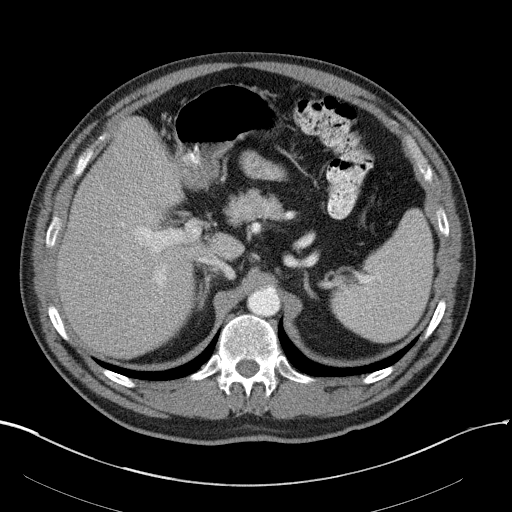
[im 81/104  soft-tissue]
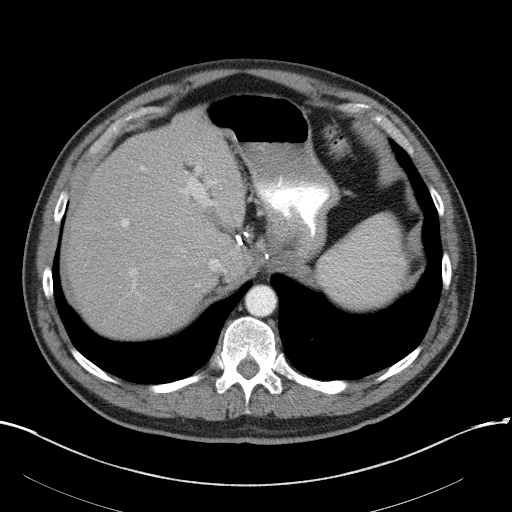
[im 92/104  soft-tissue]
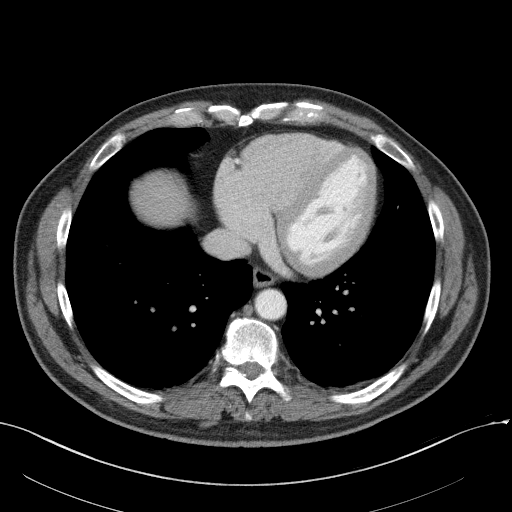
[im 98/104  soft-tissue]
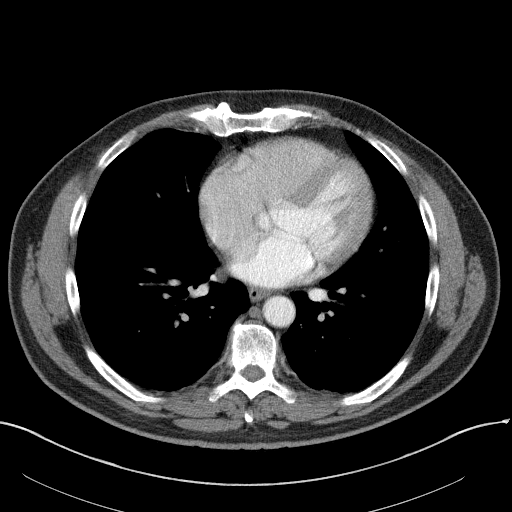

[Series 5: abd/pelvis 3.0 coronal · coronal · 0.81mm/px · 3 of 106 slices shown]
[im 36/106  soft-tissue]
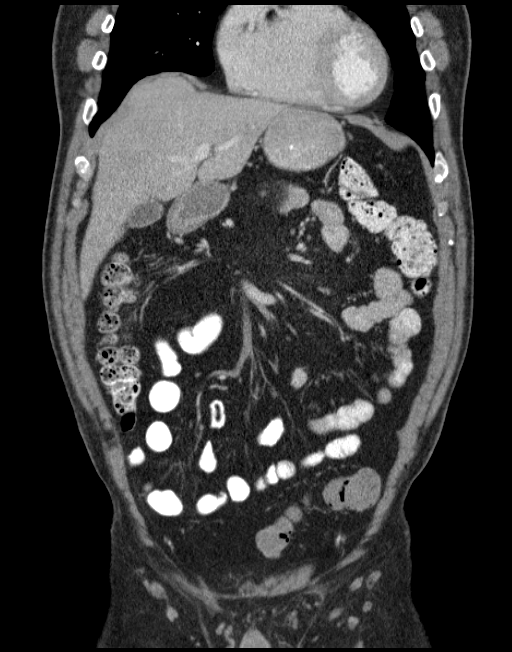
[im 47/106  soft-tissue]
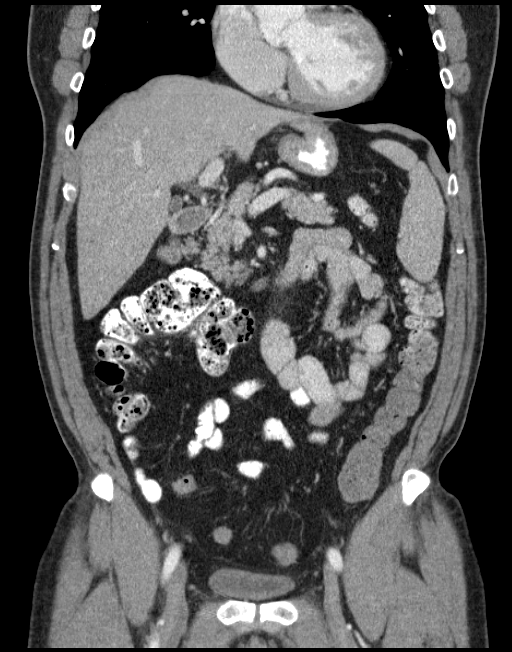
[im 59/106  soft-tissue]
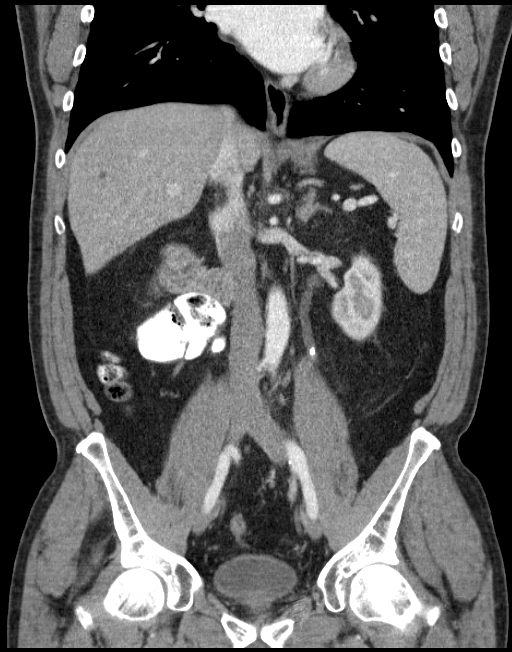

[16 of 46 positions shown; findings below may reference images not displayed]

FINDINGS: Lung bases are essentially clear.

11 mm hypervascular lesion in the posterior segment right hepatic
lobe (series 2/image 28), grossly unchanged, favored to reflect a
flash filling hemangioma.  6 mm hypoenhancing lesion in the right
hepatic lobe (series 2/image 29), too small to characterize but
unchanged, likely benign.

Spleen measures 13.3 mm in craniocaudal dimension, at the upper
limits of normal, previously 14.2 cm.

Pancreas and adrenal glands are within normal limits.

Gallbladder is unremarkable. No intrahepatic or extrahepatic ductal
dilatation.

7 mm interpolar left renal cyst (series 7/image 18).  Right kidney
is unremarkable. Mild left hydronephrosis.

No evidence of bowel obstruction.

No evidence of abdominal aortic aneurysm.

No abdominopelvic ascites.

No suspicious abdominopelvic lymphadenopathy.

Mild prostatomegaly, measuring 5.3 cm in transverse dimension.

5 mm mid left ureteral calculus (series [DATE]).  Bladder is
unremarkable.

Tiny left and small right fat-containing inguinal hernias.

Mild degenerative changes of the visualized thoracolumbar spine.
IMPRESSION: Spleen is at the upper limits of normal for size, measuring 13.3 cm
in craniocaudal dimension.

5 mm mid left ureteral calculus with mild left hydronephrosis.

## 2013-12-05 ENCOUNTER — Encounter (INDEPENDENT_AMBULATORY_CARE_PROVIDER_SITE_OTHER): Payer: Self-pay

## 2013-12-05 ENCOUNTER — Encounter: Payer: Self-pay | Admitting: Pulmonary Disease

## 2013-12-05 ENCOUNTER — Ambulatory Visit (INDEPENDENT_AMBULATORY_CARE_PROVIDER_SITE_OTHER): Payer: Medicare Other | Admitting: Pulmonary Disease

## 2013-12-05 VITALS — BP 148/74 | HR 88 | Ht 71.5 in | Wt 233.4 lb

## 2013-12-05 DIAGNOSIS — J4489 Other specified chronic obstructive pulmonary disease: Secondary | ICD-10-CM

## 2013-12-05 DIAGNOSIS — J449 Chronic obstructive pulmonary disease, unspecified: Secondary | ICD-10-CM | POA: Diagnosis not present

## 2013-12-05 DIAGNOSIS — D45 Polycythemia vera: Secondary | ICD-10-CM

## 2013-12-05 DIAGNOSIS — G4733 Obstructive sleep apnea (adult) (pediatric): Secondary | ICD-10-CM | POA: Diagnosis not present

## 2013-12-05 DIAGNOSIS — J40 Bronchitis, not specified as acute or chronic: Secondary | ICD-10-CM | POA: Diagnosis not present

## 2013-12-05 NOTE — Progress Notes (Signed)
Chief Complaint  Patient presents with  . Follow-up    Wears CPAP nightly x 8hrs. Denies problems with mask or pressure. Pt denies any issues with breathing. Pt states that with exertional activities he notices some SOB.  Pt recently diagnosed with ?Polycythemia    History of Present Illness: Connor [name] is a 68 y.o. male former smoker with OSA and chronic cough with borderline COPD.  He has been doing well with CPAP.  He has nasal mask and no issues with mask fit.  His breathing has been okay.  He does not need combivent much.  He was recently dx with polycythemia.  He has been getting intermittent phlebotomy under supervision of [name].    He gets itching and fatigue.  He denies chest pain, cough, or sputum.  He has not had fever, or swelling.   TESTS: PSG 06/08/04 >> AHI 29, SpO2 84% Auto-CPAP download 08/10/10 to 08/30/10>>Used on 21 of 21 nights with average 6hrs 46 min.  Average AHI 1.6 with optimal pressure 10 cm H2O. PFT 09/09/11>>FEV1 3.06 (95%), FEV1% 69, FEF 25-75% 1.70 (59%), TLC 7.67 (109%), DLCO 108%, no BD CPAP 10/09/12 to 11/07/12 >> Used on 29 of 30 nights with average 6 hrs 44 min. CPAP 10 cm H2O.  PMHx, PSHx, Medications, Allergies, Fhx, Shx reviewed.  Physical Exam:  General - Healthy, no distress  HEENT - wears glasses, no sinus tenderness, clear nasal discharge, narrow nasal angles, MP 2, no exudate, no LAN Cardiac - S1S2 regular, no murmur Chest - no wheeze Abd - soft, nontender, normal bowel sounds  Ext - no E/C/C  Neuro - normal strength  Psych - normal behavior and mood   Assessment/Plan: Chesley Mires, MD [place] 12/05/2013, 2:12 PM Pager:  [phone] After 3pm call: [phone]

## 2013-12-05 NOTE — Assessment & Plan Note (Signed)
He is compliant with CPAP.  Will get his download.

## 2013-12-05 NOTE — Assessment & Plan Note (Signed)
He can continue prn combivent.

## 2013-12-05 NOTE — Assessment & Plan Note (Signed)
He is followed by [name].  Will check his pulmonary status to ensure that he is not having issues with nocturnal hypoxia that could be contributing to elevated blood counts.

## 2013-12-05 NOTE — Patient Instructions (Signed)
Will get CPAP report and arrange for overnight oxygen test Follow up in 1 year

## 2013-12-06 ENCOUNTER — Ambulatory Visit (HOSPITAL_BASED_OUTPATIENT_CLINIC_OR_DEPARTMENT_OTHER): Payer: Medicare Other

## 2013-12-06 ENCOUNTER — Other Ambulatory Visit (HOSPITAL_BASED_OUTPATIENT_CLINIC_OR_DEPARTMENT_OTHER): Payer: Medicare Other | Admitting: Lab

## 2013-12-06 ENCOUNTER — Encounter: Payer: Self-pay | Admitting: Hematology & Oncology

## 2013-12-06 ENCOUNTER — Ambulatory Visit (HOSPITAL_BASED_OUTPATIENT_CLINIC_OR_DEPARTMENT_OTHER): Payer: Medicare Other | Admitting: Hematology & Oncology

## 2013-12-06 VITALS — BP 142/72 | HR 80 | Temp 97.4°F | Resp 18 | Ht 71.0 in | Wt 233.0 lb

## 2013-12-06 VITALS — BP 144/78 | HR 82 | Resp 16

## 2013-12-06 DIAGNOSIS — D45 Polycythemia vera: Secondary | ICD-10-CM

## 2013-12-06 DIAGNOSIS — D509 Iron deficiency anemia, unspecified: Secondary | ICD-10-CM | POA: Diagnosis not present

## 2013-12-06 DIAGNOSIS — D696 Thrombocytopenia, unspecified: Secondary | ICD-10-CM

## 2013-12-06 LAB — CMP (CANCER CENTER ONLY)
ALT(SGPT): 15 U/L (ref 10–47)
AST: 26 U/L (ref 11–38)
Albumin: 3.6 g/dL (ref 3.3–5.5)
Alkaline Phosphatase: 60 U/L (ref 26–84)
BUN, Bld: 21 mg/dL (ref 7–22)
CALCIUM: 8.8 mg/dL (ref 8.0–10.3)
CHLORIDE: 106 meq/L (ref 98–108)
CO2: 25 meq/L (ref 18–33)
Creat: 1 mg/dl (ref 0.6–1.2)
Glucose, Bld: 121 mg/dL — ABNORMAL HIGH (ref 73–118)
Potassium: 3.7 mEq/L (ref 3.3–4.7)
SODIUM: 140 meq/L (ref 128–145)
TOTAL PROTEIN: 6.4 g/dL (ref 6.4–8.1)
Total Bilirubin: 0.8 mg/dl (ref 0.20–1.60)

## 2013-12-06 LAB — CBC WITH DIFFERENTIAL (CANCER CENTER ONLY)
BASO#: 0 10*3/uL (ref 0.0–0.2)
BASO%: 0.5 % (ref 0.0–2.0)
EOS%: 2.5 % (ref 0.0–7.0)
Eosinophils Absolute: 0.1 10*3/uL (ref 0.0–0.5)
HCT: 44.3 % (ref 38.7–49.9)
HEMOGLOBIN: 14.5 g/dL (ref 13.0–17.1)
LYMPH#: 0.8 10*3/uL — ABNORMAL LOW (ref 0.9–3.3)
LYMPH%: 18.7 % (ref 14.0–48.0)
MCH: 27.6 pg — AB (ref 28.0–33.4)
MCHC: 32.7 g/dL (ref 32.0–35.9)
MCV: 84 fL (ref 82–98)
MONO#: 0.3 10*3/uL (ref 0.1–0.9)
MONO%: 6.3 % (ref 0.0–13.0)
NEUT#: 3.2 10*3/uL (ref 1.5–6.5)
NEUT%: 72 % (ref 40.0–80.0)
PLATELETS: 133 10*3/uL — AB (ref 145–400)
RBC: 5.26 10*6/uL (ref 4.20–5.70)
RDW: 13.8 % (ref 11.1–15.7)
WBC: 4.4 10*3/uL (ref 4.0–10.0)

## 2013-12-06 LAB — TECHNOLOGIST REVIEW CHCC SATELLITE

## 2013-12-06 NOTE — Progress Notes (Signed)
Hematology and Oncology Follow Up Visit  Connor [name] [id number] [birth year] 68 y.o. 12/06/2013   Principle Diagnosis:   Polycythemia vera-JAK2 negative  Chronic immune thrombo-cytopenia  Current Therapy:    Phlebotomy to maintain hematocrit below 45%  Aspirin 81 mg by mouth daily     Interim History:  Mr.  Connor [name] is back for followup. He is doing very well. He does feel tired. He is definitely iron deficient as the ferritin was 8. His iron saturation was16%.  He says that his pulmonologist wants to make sure that his CPAP is working. He thinks that possibly the CPAP is not working too well and that his iron saturations are going down and night time.  I think this would be very interesting as this might help explain the erythrocytosis that he has.   He says that he is does not itch all that much.  He has had no fever. He has had no nausea or vomiting. There has been no change in bowel or bladder habits. He's not had any problems with leg swelling.    Medications: Current outpatient prescriptions:alfuzosin (UROXATRAL) 10 MG 24 hr tablet, Take 10 mg by mouth every morning. , Disp: , Rfl: ;  aspirin 81 MG tablet, Take 81 mg by mouth 2 (two) times daily. , Disp: , Rfl: ;  celecoxib (CELEBREX) 200 MG capsule, 1 TABLET DAILY, Disp: , Rfl: ;  Cholecalciferol (VITAMIN D-3) 5000 UNITS TABS, Take 1 tablet by mouth daily. , Disp: , Rfl: ;  Coenzyme Q10 (CO Q 10 PO), Take 1 capsule by mouth daily. , Disp: , Rfl:  docusate sodium (PHILLIPS STOOL SOFTENER) 100 MG capsule, Take 100 mg by mouth as needed for mild constipation., Disp: , Rfl: ;  fish oil-omega-3 fatty acids 1000 MG capsule, Take 2 g by mouth daily. , Disp: , Rfl: ;  Glucosamine-Chondroit-Vit C-Mn (GLUCOSAMINE CHONDR 1500 COMPLX) CAPS, Take by mouth every morning., Disp: , Rfl:  Ipratropium-Albuterol (COMBIVENT) 20-100 MCG/ACT AERS respimat, Inhale 1 puff into the lungs as needed for wheezing or shortness of breath., Disp: , Rfl: ;   Multiple Vitamins-Minerals (CENTRUM SILVER ULTRA MENS) TABS, Take 1 tablet by mouth every morning. Once a day, Disp: , Rfl: ;  polyethylene glycol (MIRALAX / GLYCOLAX) packet, Take 17 g by mouth as needed. , Disp: , Rfl:  Probiotic Product ([place]), Take 1 capsule by mouth daily. , Disp: , Rfl: ;  pyridOXINE (VITAMIN B-6) 100 MG tablet, Take 100 mg by mouth daily. , Disp: , Rfl: ;  Turmeric 500 MG CAPS, Take by mouth every morning., Disp: , Rfl: ;  ZETIA 10 MG tablet, TAKE 1 TABLET (10 MG TOTAL) BY MOUTH DAILY., Disp: 30 tablet, Rfl: 5  Allergies:  Allergies  Allergen Reactions  . Dexlansoprazole Diarrhea and Nausea Only    Past Medical History, Surgical history, Social history, and Family History were reviewed and updated.  Review of Systems: As above  Physical Exam:  height is 5\' 11"  (1.803 m) and weight is 233 lb (105.688 kg). His oral temperature is 97.4 F (36.3 C). His blood pressure is 142/72 and his pulse is 80. His respiration is 18.   Well-developed and well-nourished white gentleman. Head and neck exam shows no ocular or oral lesions. She has no palpable cervical or supraclavicular lymph nodes. Lungs are clear. Cardiac exam regular rate and rhythm with no murmurs rubs or bruits. Abdomen is soft. She has good bowel sounds. There is no fluid wave. There is  no palpable liver or spleen tip. Extremities shows no clubbing cyanosis or edema. Strength is good bilaterally. Skin exam shows no rashes ecchymoses or petechia. Neurological exam is nonfocal.  Lab Results  Component Value Date   WBC 4.4 12/06/2013   HGB 14.5 12/06/2013   HCT 44.3 12/06/2013   MCV 84 12/06/2013   PLT 133* 12/06/2013     Chemistry      Component Value Date/Time   NA 140 12/06/2013 1311   NA 141 09/06/2013 1350   NA 142 08/27/2013 0852   K 3.7 12/06/2013 1311   K 3.9 09/06/2013 1350   CL 106 12/06/2013 1311   CL 109 09/06/2013 1350   CO2 25 12/06/2013 1311   CO2 26 09/06/2013 1350   BUN 21  12/06/2013 1311   BUN 23 09/06/2013 1350   BUN 20 08/27/2013 0852   CREATININE 1.0 12/06/2013 1311   CREATININE 1.14 09/06/2013 1350      Component Value Date/Time   CALCIUM 8.8 12/06/2013 1311   CALCIUM 9.1 09/06/2013 1350   ALKPHOS 60 12/06/2013 1311   ALKPHOS 55 09/06/2013 1350   AST 26 12/06/2013 1311   AST 17 09/06/2013 1350   ALT 15 12/06/2013 1311   ALT 12 09/06/2013 1350   BILITOT 0.80 12/06/2013 1311   BILITOT 0.6 09/06/2013 1350         Impression and Plan: [name] is a 68 year old gentleman with polycythemia. Again I think he would be very interesting to see if a CPAP does help.  We will go ahead and phlebotomize him today. This I think will continue to help him.  We will get him back in another month or so and will see how he is doing.   Volanda Napoleon, MD 8/27/20156:18 PM

## 2013-12-06 NOTE — Patient Instructions (Signed)

## 2013-12-07 LAB — IRON AND TIBC CHCC
%SAT: 20 % (ref 20–55)
Iron: 73 ug/dL (ref 42–163)
TIBC: 371 ug/dL (ref 202–409)
UIBC: 299 ug/dL (ref 117–376)

## 2013-12-07 LAB — FERRITIN CHCC: Ferritin: 10 ng/ml — ABNORMAL LOW (ref 22–316)

## 2013-12-18 ENCOUNTER — Telehealth: Payer: Self-pay | Admitting: Pulmonary Disease

## 2013-12-18 DIAGNOSIS — G4733 Obstructive sleep apnea (adult) (pediatric): Secondary | ICD-10-CM

## 2013-12-18 NOTE — Telephone Encounter (Signed)
lmtcb x1 for [employer]

## 2013-12-18 NOTE — Telephone Encounter (Signed)
ONO with RA 12/05/13 >> Test time 7 hrs 47 min.  Mean SpO2 92.8%, low SpO2 89%.  Will have my nurse inform pt that oxygen test looked good.  He does not need supplemental oxygen at night.

## 2013-12-19 NOTE — Telephone Encounter (Signed)
Pt being transitioned from [facility] to [facility] d/t Medicare for insurance coverage Per Melissa, needing an order for CPAP supplies.  Order placed.  Nothing further needed.

## 2013-12-19 NOTE — Telephone Encounter (Signed)
Melissa returned call- [phone]

## 2013-12-19 NOTE — Telephone Encounter (Signed)
Results have been explained to patient, pt expressed understanding. Nothing further needed.  

## 2013-12-24 ENCOUNTER — Telehealth: Payer: Self-pay | Admitting: Pulmonary Disease

## 2013-12-24 DIAGNOSIS — G4733 Obstructive sleep apnea (adult) (pediatric): Secondary | ICD-10-CM

## 2013-12-24 DIAGNOSIS — Z9989 Dependence on other enabling machines and devices: Principal | ICD-10-CM

## 2013-12-24 NOTE — Telephone Encounter (Signed)
Received information that Pt will need to change DME's.  Will send order to have this changed for CPAP supplies.

## 2014-01-15 DIAGNOSIS — Z888 Allergy status to other drugs, medicaments and biological substances status: Secondary | ICD-10-CM | POA: Diagnosis not present

## 2014-01-15 DIAGNOSIS — H0231 Blepharochalasis right upper eyelid: Secondary | ICD-10-CM | POA: Diagnosis not present

## 2014-01-15 DIAGNOSIS — J449 Chronic obstructive pulmonary disease, unspecified: Secondary | ICD-10-CM | POA: Diagnosis not present

## 2014-01-15 DIAGNOSIS — Z87891 Personal history of nicotine dependence: Secondary | ICD-10-CM | POA: Diagnosis not present

## 2014-01-15 DIAGNOSIS — H0234 Blepharochalasis left upper eyelid: Secondary | ICD-10-CM | POA: Diagnosis not present

## 2014-01-15 DIAGNOSIS — D696 Thrombocytopenia, unspecified: Secondary | ICD-10-CM | POA: Diagnosis not present

## 2014-01-17 ENCOUNTER — Ambulatory Visit (HOSPITAL_BASED_OUTPATIENT_CLINIC_OR_DEPARTMENT_OTHER): Payer: Medicare Other | Admitting: Hematology & Oncology

## 2014-01-17 ENCOUNTER — Encounter: Payer: Self-pay | Admitting: Hematology & Oncology

## 2014-01-17 ENCOUNTER — Ambulatory Visit (HOSPITAL_BASED_OUTPATIENT_CLINIC_OR_DEPARTMENT_OTHER): Payer: Medicare Other

## 2014-01-17 ENCOUNTER — Other Ambulatory Visit (HOSPITAL_BASED_OUTPATIENT_CLINIC_OR_DEPARTMENT_OTHER): Payer: Medicare Other | Admitting: Lab

## 2014-01-17 VITALS — BP 134/73 | HR 75 | Temp 97.9°F | Resp 18 | Ht 70.0 in | Wt 233.0 lb

## 2014-01-17 DIAGNOSIS — Z23 Encounter for immunization: Secondary | ICD-10-CM

## 2014-01-17 DIAGNOSIS — D45 Polycythemia vera: Secondary | ICD-10-CM

## 2014-01-17 DIAGNOSIS — R635 Abnormal weight gain: Secondary | ICD-10-CM

## 2014-01-17 DIAGNOSIS — D751 Secondary polycythemia: Secondary | ICD-10-CM

## 2014-01-17 DIAGNOSIS — D696 Thrombocytopenia, unspecified: Secondary | ICD-10-CM

## 2014-01-17 DIAGNOSIS — J449 Chronic obstructive pulmonary disease, unspecified: Secondary | ICD-10-CM

## 2014-01-17 DIAGNOSIS — D509 Iron deficiency anemia, unspecified: Secondary | ICD-10-CM

## 2014-01-17 LAB — CMP (CANCER CENTER ONLY)
ALK PHOS: 48 U/L (ref 26–84)
ALT(SGPT): 18 U/L (ref 10–47)
AST: 23 U/L (ref 11–38)
Albumin: 3.7 g/dL (ref 3.3–5.5)
BILIRUBIN TOTAL: 0.7 mg/dL (ref 0.20–1.60)
BUN: 18 mg/dL (ref 7–22)
CO2: 27 meq/L (ref 18–33)
CREATININE: 1.1 mg/dL (ref 0.6–1.2)
Calcium: 9.2 mg/dL (ref 8.0–10.3)
Chloride: 103 mEq/L (ref 98–108)
GLUCOSE: 100 mg/dL (ref 73–118)
Potassium: 4 mEq/L (ref 3.3–4.7)
Sodium: 139 mEq/L (ref 128–145)
Total Protein: 6.3 g/dL — ABNORMAL LOW (ref 6.4–8.1)

## 2014-01-17 LAB — CBC WITH DIFFERENTIAL (CANCER CENTER ONLY)
BASO#: 0 10*3/uL (ref 0.0–0.2)
BASO%: 0.8 % (ref 0.0–2.0)
EOS%: 4.5 % (ref 0.0–7.0)
Eosinophils Absolute: 0.2 10*3/uL (ref 0.0–0.5)
HCT: 43.3 % (ref 38.7–49.9)
HGB: 14.1 g/dL (ref 13.0–17.1)
LYMPH#: 1.1 10*3/uL (ref 0.9–3.3)
LYMPH%: 22.3 % (ref 14.0–48.0)
MCH: 26.9 pg — AB (ref 28.0–33.4)
MCHC: 32.6 g/dL (ref 32.0–35.9)
MCV: 83 fL (ref 82–98)
MONO#: 0.4 10*3/uL (ref 0.1–0.9)
MONO%: 8.1 % (ref 0.0–13.0)
NEUT%: 64.3 % (ref 40.0–80.0)
NEUTROS ABS: 3.3 10*3/uL (ref 1.5–6.5)
PLATELETS: 152 10*3/uL (ref 145–400)
RBC: 5.24 10*6/uL (ref 4.20–5.70)
RDW: 14.2 % (ref 11.1–15.7)
WBC: 5.1 10*3/uL (ref 4.0–10.0)

## 2014-01-17 MED ORDER — INFLUENZA VAC SPLIT QUAD 0.5 ML IM SUSY
0.5000 mL | PREFILLED_SYRINGE | INTRAMUSCULAR | Status: AC
  Administered 2014-01-17: 0.5 mL via INTRAMUSCULAR
  Filled 2014-01-17: qty 0.5

## 2014-01-17 NOTE — Addendum Note (Signed)
Addended by: Burney Gauze R on: 01/17/2014 04:20 PM   Modules accepted: Orders

## 2014-01-17 NOTE — Patient Instructions (Signed)

## 2014-01-17 NOTE — Progress Notes (Signed)
Hematology and Oncology Follow Up Visit  Connor [name] [id number] 30-Mar-1946 68 y.o. 01/17/2014   Principle Diagnosis:   Polycythemia vera-JAK2 negative  Chronic immune thrombocytopenia  Current Therapy:    Phlebotomy to maintain hematocrit below 45%  Aspirin 81 mg by mouth daily     Interim History:  [name] is back for followup. He is doing very well. He played golf yesterday. He feels pretty well. He does not feel as tired.  He does have a new CPAP. This does seem to help him a little bit.  He's had no problems with iron overload. We'll last saw him in August, surgeon was 10. His iron saturation was 20%.   He says that he is does not itch all that much.  He has had no fever. He has had no nausea or vomiting. There has been no change in bowel or bladder habits. He's not had any problems with leg swelling. There's been no bleeding.  He is trying to lose weight.    Medications: Current outpatient prescriptions:alfuzosin (UROXATRAL) 10 MG 24 hr tablet, Take 10 mg by mouth every morning. , Disp: , Rfl: ;  aspirin 81 MG tablet, Take 81 mg by mouth 2 (two) times daily. , Disp: , Rfl: ;  Cholecalciferol (VITAMIN D-3) 5000 UNITS TABS, Take 1 tablet by mouth daily. , Disp: , Rfl: ;  Coenzyme Q10 (CO Q 10 PO), Take 1 capsule by mouth daily. , Disp: , Rfl:  docusate sodium (PHILLIPS STOOL SOFTENER) 100 MG capsule, Take 100 mg by mouth as needed for mild constipation., Disp: , Rfl: ;  fish oil-omega-3 fatty acids 1000 MG capsule, Take 2 g by mouth daily. , Disp: , Rfl: ;  Glucosamine-Chondroit-Vit C-Mn (GLUCOSAMINE CHONDR 1500 COMPLX) CAPS, Take by mouth every morning., Disp: , Rfl:  Ipratropium-Albuterol (COMBIVENT) 20-100 MCG/ACT AERS respimat, Inhale 1 puff into the lungs as needed for wheezing or shortness of breath., Disp: , Rfl: ;  Multiple Vitamins-Minerals (CENTRUM SILVER ULTRA MENS) TABS, Take 1 tablet by mouth every morning. Once a day, Disp: , Rfl: ;  polyethylene glycol  (MIRALAX / GLYCOLAX) packet, Take 17 g by mouth as needed. , Disp: , Rfl:  Probiotic Product ([place]), Take 1 capsule by mouth daily. , Disp: , Rfl: ;  pyridOXINE (VITAMIN B-6) 100 MG tablet, Take 100 mg by mouth daily. , Disp: , Rfl: ;  Turmeric 500 MG CAPS, Take by mouth every morning., Disp: , Rfl: ;  ZETIA 10 MG tablet, TAKE 1 TABLET (10 MG TOTAL) BY MOUTH DAILY., Disp: 30 tablet, Rfl: 5;  celecoxib (CELEBREX) 200 MG capsule, 1 TABLET DAILY -----  PT D/C ON HIS OWN  01-10-14, Disp: , Rfl:   Allergies:  Allergies  Allergen Reactions  . Dexlansoprazole Diarrhea and Nausea Only  . Dexlansoprazole Diarrhea and Nausea Only    unknown    Past Medical History, Surgical history, Social history, and Family History were reviewed and updated.  Review of Systems: As above  Physical Exam:  height is 5\' 10"  (1.778 m) and weight is 233 lb (105.688 kg). His oral temperature is 97.9 F (36.6 C). His blood pressure is 134/73 and his pulse is 75. His respiration is 18.   Well-developed and well-nourished white gentleman. Head and neck exam shows no ocular or oral lesions. She has no palpable cervical or supraclavicular lymph nodes. Lungs are clear. Cardiac exam regular rate and rhythm with no murmurs rubs or bruits. Abdomen is soft. She has  good bowel sounds. There is no fluid wave. There is no palpable liver or spleen tip. Extremities shows no clubbing cyanosis or edema. Strength is good bilaterally. Skin exam shows no rashes ecchymoses or petechia. Neurological exam is nonfocal.  Lab Results  Component Value Date   WBC 5.1 01/17/2014   HGB 14.1 01/17/2014   HCT 43.3 01/17/2014   MCV 83 01/17/2014   PLT 152 01/17/2014     Chemistry      Component Value Date/Time   NA 139 01/17/2014 1344   NA 141 09/06/2013 1350   NA 142 08/27/2013 0852   K 4.0 01/17/2014 1344   K 3.9 09/06/2013 1350   CL 103 01/17/2014 1344   CL 109 09/06/2013 1350   CO2 27 01/17/2014 1344   CO2 26 09/06/2013 1350    BUN 18 01/17/2014 1344   BUN 23 09/06/2013 1350   BUN 20 08/27/2013 0852   CREATININE 1.1 01/17/2014 1344   CREATININE 1.14 09/06/2013 1350      Component Value Date/Time   CALCIUM 9.2 01/17/2014 1344   CALCIUM 9.1 09/06/2013 1350   ALKPHOS 48 01/17/2014 1344   ALKPHOS 55 09/06/2013 1350   AST 23 01/17/2014 1344   AST 17 09/06/2013 1350   ALT 18 01/17/2014 1344   ALT 12 09/06/2013 1350   BILITOT 0.70 01/17/2014 1344   BILITOT 0.6 09/06/2013 1350         Impression and Plan: Connor [name] is a 68 year old gentleman with polycythemia. Again I think that the CPAP might be helping. He does not have to be phlebotomized.  I am going to send his blood next time for some additional genetic analysis.  We will get him back in another month or so and will see how he is doing.   Volanda Napoleon, MD 10/8/20153:12 PM

## 2014-01-17 NOTE — Progress Notes (Signed)
Pt not receiving a phlebotomy today due to HCT <45 per MD.

## 2014-01-18 LAB — IRON AND TIBC CHCC
%SAT: 14 % — ABNORMAL LOW (ref 20–55)
IRON: 53 ug/dL (ref 42–163)
TIBC: 374 ug/dL (ref 202–409)
UIBC: 321 ug/dL (ref 117–376)

## 2014-01-18 LAB — TSH CHCC: TSH: 1.546 m(IU)/L (ref 0.320–4.118)

## 2014-01-18 LAB — FERRITIN CHCC: Ferritin: 8 ng/ml — ABNORMAL LOW (ref 22–316)

## 2014-02-06 ENCOUNTER — Ambulatory Visit (INDEPENDENT_AMBULATORY_CARE_PROVIDER_SITE_OTHER): Payer: Medicare Other | Admitting: Cardiovascular Disease

## 2014-02-06 ENCOUNTER — Encounter: Payer: Self-pay | Admitting: *Deleted

## 2014-02-06 ENCOUNTER — Encounter: Payer: Self-pay | Admitting: Cardiovascular Disease

## 2014-02-06 VITALS — BP 132/86 | HR 77 | Ht 71.0 in | Wt 232.2 lb

## 2014-02-06 DIAGNOSIS — D509 Iron deficiency anemia, unspecified: Secondary | ICD-10-CM

## 2014-02-06 DIAGNOSIS — I517 Cardiomegaly: Secondary | ICD-10-CM

## 2014-02-06 DIAGNOSIS — I10 Essential (primary) hypertension: Secondary | ICD-10-CM | POA: Diagnosis not present

## 2014-02-06 DIAGNOSIS — R079 Chest pain, unspecified: Secondary | ICD-10-CM

## 2014-02-06 DIAGNOSIS — Z8249 Family history of ischemic heart disease and other diseases of the circulatory system: Secondary | ICD-10-CM

## 2014-02-06 DIAGNOSIS — D45 Polycythemia vera: Secondary | ICD-10-CM

## 2014-02-06 DIAGNOSIS — D696 Thrombocytopenia, unspecified: Secondary | ICD-10-CM

## 2014-02-06 DIAGNOSIS — G4733 Obstructive sleep apnea (adult) (pediatric): Secondary | ICD-10-CM

## 2014-02-06 MED ORDER — LOSARTAN POTASSIUM 50 MG PO TABS: 50.0000 mg | ORAL_TABLET | Freq: Every day | ORAL | Status: DC

## 2014-02-06 NOTE — Patient Instructions (Signed)
Your physician recommends that you schedule a follow-up appointment in: 2 MONTHS WITH DR KELLY  START LOSARTAN 50 MG 1/2 TABLET ONCE DAILY= TRACK BLOOD PRESSURE- IF BP CONSISTENTLY GREATER THAN 140 INCREASE LOSARTAN TO ONE TABLET DAILY  Your physician recommends that you return for lab work in: 2 WEEKS- DO NOT EAT PRIOR TO LAB WORK  Your physician has requested that you have an echocardiogram. Echocardiography is a painless test that uses sound waves to create images of your heart. It provides your doctor with information about the size and shape of your heart and how well your heart's chambers and valves are working. This procedure takes approximately one hour. There are no restrictions for this procedure.

## 2014-02-06 NOTE — Progress Notes (Signed)
Patient ID: Connor [name], male   DOB: 10-Jan-1946, 68 y.o.   MRN: [id number]     HPI: Connor [name] is a 68 y.o. male who presents to the office for one year cardiology evaluation.   Connor [name] has a history of significant hyperlipidemia for which the past he had been on combination therapy. He had taken Niaspan for some time and more recently had taken Zetia as well as Crestor.  However, presently, he is just on Zetia10 mg in addition to 2 g of fish oil daily.  He has a strong family history for coronary artery disease and 2 of his younger brothers have had previous  coronary artery stenting. He  has a history of GERD and is status post laparoscopic Nissen fundoplication by Dr. Lilia Pro due to hiatal hernia severe GERD. An echo Doppler study in November 2013 showed an ejection fraction > 55% with grade 1 diastolic dysfunction. He had mild left atrial enlargement and evidence for mild aortic valve sclerosis. He underwent carotid duplex imaging which was normal. A nuclear perfusion study which had been done to evaluate shortness of breath showed normal perfusion and function.  When I saw him last year he did have microcytic indices and was found to be iron deficient. He also subsequently developed thrombocytopenia and has been under the care of [name].  He has been diagnosed with polycythemia vera-JAK2 negative and chronic immune thrombocytopenia.  He apparently did receive IV iron therapy, and underwent ultrasound of the spleen as well as bone marrow. He also has undergone colonoscopy. He tells me his platelet count had dropped to 73,000 but recent laboratory from 01/17/2014 demonstrates a platelet count of 152,000.    From a cardiac standpoint, he denies chest pain. He is unaware of palpitations. There is no presyncope or syncope.  He remains active but has noticed some mild shortness of breath with activity.  He also is on CPAP therapy for obstructive sleep apnea and is followed by Dr. sued the new  CPAP machine.  He will be undergoing surgery by Dr. Lorin Mercy at [facility] for droopy eyelids.  He presents for evaluation.  Past Medical History  Diagnosis Date  . Hyperlipemia   . Nephrolithiasis   . Carotid artery stenosis   . Cough 05/26/2007  . COPD, mild 05/26/2007    PFT 09/09/11>>FEV1 3.06 (95%), FEV1% 69, FEF 25-75% 1.70 (59%), TLC 7.67 (109%), DLCO 108%, no BD    . GERD (gastroesophageal reflux disease)   . Shortness of breath     with exertion   . Sleep apnea     Cpap-   . Cancer     squamous cell on nose , inside nose   . Erectile dysfunction   . Peyronie's disease   . Anemia   . BPH (benign prostatic hyperplasia)   . Cardiomegaly   . H/O vitamin D deficiency   . Thrombocytopenia   . Iron deficiency anemia, unspecified 09/01/2012  . Polycythemia vera(238.4) 04/19/2013    Past Surgical History  Procedure Laterality Date  . Appendectomy    . Knee surgery      right  . Shoulder surgery      left  . Nose surgery  2013  . Laparoscopic nissen fundoplication  1/60/1093    Procedure: LAPAROSCOPIC NISSEN FUNDOPLICATION;  Surgeon: Pedro Earls, MD;  Location: [facility];  Service: General;  Laterality: N/A;  . Hiatal hernia repair  11/24/2011    Procedure: LAPAROSCOPIC REPAIR OF HIATAL HERNIA;  Surgeon:  Pedro Earls, MD;  Location: [facility];  Service: General;;    Allergies  Allergen Reactions  . Dexlansoprazole Diarrhea and Nausea Only  . Dexlansoprazole Diarrhea and Nausea Only    unknown    Current Outpatient Prescriptions  Medication Sig Dispense Refill  . alfuzosin (UROXATRAL) 10 MG 24 hr tablet Take 10 mg by mouth every morning.       . Cholecalciferol (VITAMIN D-3) 5000 UNITS TABS Take 1 tablet by mouth daily.       . Coenzyme Q10 (CO Q 10 PO) Take 1 capsule by mouth daily.       . Glucosamine-Chondroit-Vit C-Mn (GLUCOSAMINE CHONDR 1500 COMPLX) CAPS Take by mouth every morning.      . Ipratropium-Albuterol (COMBIVENT) 20-100 MCG/ACT AERS respimat Inhale  1 puff into the lungs as needed for wheezing or shortness of breath.      . Multiple Vitamins-Minerals (CENTRUM SILVER ULTRA MENS) TABS Take 1 tablet by mouth every morning. Once a day      . polyethylene glycol (MIRALAX / GLYCOLAX) packet Take 17 g by mouth as needed.       . Probiotic Product (PHILLIPS COLON HEALTH PO) Take 1 capsule by mouth daily.       . Turmeric 500 MG CAPS Take by mouth every morning.      [name] ZETIA 10 MG tablet TAKE 1 TABLET (10 MG TOTAL) BY MOUTH DAILY.  30 tablet  5  . aspirin 81 MG tablet Take 81 mg by mouth 2 (two) times daily.       . celecoxib (CELEBREX) 200 MG capsule 1 TABLET DAILY -----  PT D/C ON HIS OWN  01-10-14      . fish oil-omega-3 fatty acids 1000 MG capsule Take 2 g by mouth daily.       [name] pyridOXINE (VITAMIN B-6) 100 MG tablet Take 100 mg by mouth daily.        No current facility-administered medications for this visit.    History   Social History  . Marital Status: Married    Spouse Name: N/A    Number of Children: 3  . Years of Education: N/A   Occupational History  . retired    Social History Main Topics  . Smoking status: Former Smoker -- 0.25 packs/day for 11 years    Types: Cigarettes    Start date: 05/17/1958    Quit date: 04/12/1966  . Smokeless tobacco: Never Used     Comment: quit smoking in 1968  . Alcohol Use: Yes     Comment: seldom  . Drug Use: No  . Sexual Activity: Not on file   Other Topics Concern  . Not on file   Social History Narrative  . No narrative on file    Family History  Problem Relation Age of Onset  . Heart disease Mother   . Diabetes Mother   . Heart disease Father   . Heart disease Sister   . Heart disease Brother   . Diabetes Brother   . Colon cancer Neg Hx   . Esophageal cancer Neg Hx   . Rectal cancer Neg Hx   . Stomach cancer Neg Hx   . Liver cancer Paternal Grandfather   . Throat cancer Maternal Grandfather    Social history is notable in that he is married has 3 children 5  grandchildren and 3 stepgrandchildren. He is retired. No tobacco history. He does drink rare wine. He exercises several days per week for 30-45 minutes.  ROS General: Negative; No fevers, chills, or night sweats;  HEENT: Positive for IIb.  Eyelids No changes in vision or hearing, sinus congestion, difficulty swallowing Pulmonary: Negative; No cough, wheezing, shortness of breath, hemoptysis Cardiovascular: Negative; No chest pain, presyncope, syncope, palpitations GI: Positive for GERD; No nausea, vomiting, diarrhea, or abdominal pain GU: Negative; No dysuria, hematuria, or difficulty voiding Musculoskeletal: Negative; no myalgias, joint pain, or weakness Hematologic/Oncology: Positive for polycythemia vera for which he undergoes phlebotomy to keep his hematocrit less than 45; chronic immune thrombocytopenia; no easy bruising, bleeding Endocrine: Negative; no heat/cold intolerance; no diabetes Neuro: Negative; no changes in balance, headaches Skin: Negative; No rashes or skin lesions Psychiatric: Negative; No behavioral problems, depression Sleep: Positive for obstructive sleep apnea on CPAP therapy No snoring, daytime sleepiness, hypersomnolence, bruxism, restless legs, hypnogognic hallucinations, no cataplexy Other comprehensive 14 point system review is negative.   PE BP 132/86  Pulse 77  Ht 5\' 11"  (1.803 m)  Wt 232 lb 3.2 oz (105.325 kg)  BMI 32.40 kg/m2  General: Alert, oriented, no distress.  Skin: normal turgor, no rashes HEENT: Normocephalic, atraumatic. Pupils round and reactive; sclera anicteric; mildly droopy eyelids Nose without nasal septal hypertrophy Mouth/Parynx benign; Mallinpatti scale 2/3 Neck: No JVD, no carotid bruits Lungs: clear to ausculatation and percussion; no wheezing or rales Chest wall: Nontender to palpation Heart: RRR, s1 s2 normal 1/6 systolic murmur.  No diastolic murmur.  No rubs, thrills or heaves Abdomen: soft, nontender; no  hepatosplenomehaly, BS+; abdominal aorta nontender and not dilated by palpation. Back: No CVA tenderness Pulses 2+ Extremities: Trivial ankle edema ,no clubbing cyanosis, Homan's sign negative  Neurologic: grossly nonfocal Psychologic: normal affect and mood.  ECG (independently read by me): Normal sinus rhythm at 77 bpm.  No ectopy.  Normal intervals.  November 2014 ECG: Sinus rhythm at 77 beats per minute. No change.  LABS:  BMET    Component Value Date/Time   NA 139 01/17/2014 1344   NA 141 09/06/2013 1350   NA 142 08/27/2013 0852   K 4.0 01/17/2014 1344   K 3.9 09/06/2013 1350   CL 103 01/17/2014 1344   CL 109 09/06/2013 1350   CO2 27 01/17/2014 1344   CO2 26 09/06/2013 1350   GLUCOSE 100 01/17/2014 1344   GLUCOSE 97 09/06/2013 1350   GLUCOSE 104* 08/27/2013 0852   BUN 18 01/17/2014 1344   BUN 23 09/06/2013 1350   BUN 20 08/27/2013 0852   CREATININE 1.1 01/17/2014 1344   CREATININE 1.14 09/06/2013 1350   CALCIUM 9.2 01/17/2014 1344   CALCIUM 9.1 09/06/2013 1350   GFRNONAA 85 08/27/2013 0852   GFRAA 98 08/27/2013 0852     Hepatic Function Panel     Component Value Date/Time   PROT 6.3* 01/17/2014 1344   PROT 5.9* 09/06/2013 1350   PROT 6.0 08/27/2013 0852   ALBUMIN 4.2 09/06/2013 1350   AST 23 01/17/2014 1344   AST 17 09/06/2013 1350   ALT 18 01/17/2014 1344   ALT 12 09/06/2013 1350   ALKPHOS 48 01/17/2014 1344   ALKPHOS 55 09/06/2013 1350   BILITOT 0.70 01/17/2014 1344   BILITOT 0.6 09/06/2013 1350   BILIDIR 0.17 08/27/2013 0852   IBILI 0.6 07/20/2012 0947     CBC    Component Value Date/Time   WBC 5.1 01/17/2014 1344   WBC 5.0 08/27/2013 0913   WBC 4.9 07/28/2012 0725   RBC 5.24 01/17/2014 1344   RBC 5.02 09/06/2013 1350   RBC 5.5 08/27/2013  0913   RBC 5.52 07/28/2012 0725   HGB 14.1 01/17/2014 1344   HGB 14.8 08/27/2013 0913   HGB 16.2 07/28/2012 0725   HCT 43.3 01/17/2014 1344   HCT 47.5 08/27/2013 0913   HCT 47.0 07/28/2012 0725   PLT 152 01/17/2014 1344   PLT 124* 07/28/2012 0725     MCV 83 01/17/2014 1344   MCV 86.9 08/27/2013 0913   MCV 85.1 07/28/2012 0725   MCH 26.9* 01/17/2014 1344   MCH 27.1 08/27/2013 0913   MCH 29.3 07/28/2012 0725   MCHC 32.6 01/17/2014 1344   MCHC 31.1* 08/27/2013 0913   MCHC 34.5 07/28/2012 0725   RDW 14.2 01/17/2014 1344   RDW 14.1 07/28/2012 0725   LYMPHSABS 1.1 01/17/2014 1344   LYMPHSABS 0.8 11/25/2011 0330   MONOABS 0.7 11/25/2011 0330   EOSABS 0.2 01/17/2014 1344   EOSABS 0.0 11/25/2011 0330   BASOSABS 0.0 01/17/2014 1344   BASOSABS 0.0 11/25/2011 0330     BNP No results found for this basename: probnp    Lipid Panel     Component Value Date/Time   CHOL 133 08/27/2013 0852   TRIG 81 08/27/2013 0852   TRIG 128 07/20/2012 0947   HDL 35* 08/27/2013 0852   LDLCALC 82 08/27/2013 0852   LDLCALC 93 07/20/2012 0947     RADIOLOGY: No results found.    ASSESSMENT AND PLAN: Mr. Stooksbury is now 68 years old.  His blood pressure today is mildly elevated.  A prior echo Doppler study in 2013 suggested impaired diastolic relaxation.  I'm electing to add losartan, initially at 25 mg daily, which will be helpful both for his mild hypertension, diastolic dysfunction, and this may improve his shortness of breath with activity.  He does have trivial ankle edema but this has improved with discontinuance of his Celebrex recently.  He now is on CPAP therapy and using this with 1% compliance for his obstructive sleep apnea.  His rhythm remained stable.  I am recommending follow-up copy is a metabolic panel, lipid panel and TSH.  He will be undergoing eye surgery several weeks.  I'm also scheduling him for a tear.  Follow-up echo Doppler study.  He now is on zetia alone for his hyperlipidemia. Remotely had used niacin and subsequently Crestor.  I will see him in the office in 2 months for follow-up evaluation.    Troy Sine, MD, [facility]  02/06/2014 12:59 PM

## 2014-02-11 ENCOUNTER — Ambulatory Visit (HOSPITAL_COMMUNITY)
Admission: RE | Admit: 2014-02-11 | Discharge: 2014-02-11 | Disposition: A | Discharge status: Home / Self Care | Payer: Medicare Other | Source: Ambulatory Visit | Attending: Cardiology | Admitting: Cardiology

## 2014-02-11 DIAGNOSIS — R0600 Dyspnea, unspecified: Secondary | ICD-10-CM | POA: Insufficient documentation

## 2014-02-11 DIAGNOSIS — Z8249 Family history of ischemic heart disease and other diseases of the circulatory system: Secondary | ICD-10-CM

## 2014-02-11 DIAGNOSIS — Z87891 Personal history of nicotine dependence: Secondary | ICD-10-CM | POA: Diagnosis not present

## 2014-02-11 DIAGNOSIS — E785 Hyperlipidemia, unspecified: Secondary | ICD-10-CM | POA: Diagnosis not present

## 2014-02-11 DIAGNOSIS — I517 Cardiomegaly: Secondary | ICD-10-CM | POA: Diagnosis not present

## 2014-02-11 DIAGNOSIS — I10 Essential (primary) hypertension: Secondary | ICD-10-CM

## 2014-02-11 DIAGNOSIS — R0602 Shortness of breath: Secondary | ICD-10-CM

## 2014-02-11 LAB — COMPREHENSIVE METABOLIC PANEL
ALK PHOS: 66 U/L (ref 39–117)
ALT: 11 U/L (ref 0–53)
AST: 15 U/L (ref 0–37)
Albumin: 4.1 g/dL (ref 3.5–5.2)
BUN: 17 mg/dL (ref 6–23)
CALCIUM: 9.2 mg/dL (ref 8.4–10.5)
CHLORIDE: 110 meq/L (ref 96–112)
CO2: 27 mEq/L (ref 19–32)
Creat: 1.02 mg/dL (ref 0.50–1.35)
GLUCOSE: 105 mg/dL — AB (ref 70–99)
POTASSIUM: 4.1 meq/L (ref 3.5–5.3)
SODIUM: 144 meq/L (ref 135–145)
TOTAL PROTEIN: 6.3 g/dL (ref 6.0–8.3)
Total Bilirubin: 0.6 mg/dL (ref 0.2–1.2)

## 2014-02-11 LAB — TSH: TSH: 1.4 u[IU]/mL (ref 0.350–4.500)

## 2014-02-11 LAB — LIPID PANEL
Cholesterol: 133 mg/dL (ref 0–200)
HDL: 33 mg/dL — ABNORMAL LOW (ref >39)
LDL CALC: 80 mg/dL (ref 0–99)
Total CHOL/HDL Ratio: 4 Ratio
Triglycerides: 100 mg/dL (ref <150)
VLDL: 20 mg/dL (ref 0–40)

## 2014-02-11 NOTE — Progress Notes (Signed)
2D Echocardiogram Complete.  02/11/2014   Martavious Hartel, RDCS  

## 2014-02-13 DIAGNOSIS — H0231 Blepharochalasis right upper eyelid: Secondary | ICD-10-CM | POA: Diagnosis not present

## 2014-02-13 DIAGNOSIS — Z888 Allergy status to other drugs, medicaments and biological substances status: Secondary | ICD-10-CM | POA: Diagnosis not present

## 2014-02-13 DIAGNOSIS — G4733 Obstructive sleep apnea (adult) (pediatric): Secondary | ICD-10-CM | POA: Diagnosis not present

## 2014-02-13 DIAGNOSIS — K219 Gastro-esophageal reflux disease without esophagitis: Secondary | ICD-10-CM | POA: Diagnosis not present

## 2014-02-13 DIAGNOSIS — E785 Hyperlipidemia, unspecified: Secondary | ICD-10-CM | POA: Diagnosis not present

## 2014-02-13 DIAGNOSIS — H02423 Myogenic ptosis of bilateral eyelids: Secondary | ICD-10-CM | POA: Diagnosis not present

## 2014-02-13 DIAGNOSIS — D696 Thrombocytopenia, unspecified: Secondary | ICD-10-CM | POA: Diagnosis not present

## 2014-02-13 DIAGNOSIS — J449 Chronic obstructive pulmonary disease, unspecified: Secondary | ICD-10-CM | POA: Diagnosis not present

## 2014-02-13 DIAGNOSIS — H02403 Unspecified ptosis of bilateral eyelids: Secondary | ICD-10-CM | POA: Diagnosis not present

## 2014-02-13 DIAGNOSIS — H0230 Blepharochalasis unspecified eye, unspecified eyelid: Secondary | ICD-10-CM | POA: Diagnosis not present

## 2014-02-13 DIAGNOSIS — H0234 Blepharochalasis left upper eyelid: Secondary | ICD-10-CM | POA: Diagnosis not present

## 2014-02-13 DIAGNOSIS — N400 Enlarged prostate without lower urinary tract symptoms: Secondary | ICD-10-CM | POA: Diagnosis not present

## 2014-02-13 DIAGNOSIS — Z7982 Long term (current) use of aspirin: Secondary | ICD-10-CM | POA: Diagnosis not present

## 2014-02-13 DIAGNOSIS — D509 Iron deficiency anemia, unspecified: Secondary | ICD-10-CM | POA: Diagnosis not present

## 2014-02-13 DIAGNOSIS — Z87891 Personal history of nicotine dependence: Secondary | ICD-10-CM | POA: Diagnosis not present

## 2014-02-15 ENCOUNTER — Encounter: Payer: Self-pay | Admitting: *Deleted

## 2014-02-19 DIAGNOSIS — Z9889 Other specified postprocedural states: Secondary | ICD-10-CM | POA: Diagnosis not present

## 2014-02-19 DIAGNOSIS — H02403 Unspecified ptosis of bilateral eyelids: Secondary | ICD-10-CM | POA: Diagnosis not present

## 2014-02-19 DIAGNOSIS — H0231 Blepharochalasis right upper eyelid: Secondary | ICD-10-CM | POA: Diagnosis not present

## 2014-02-19 DIAGNOSIS — H0234 Blepharochalasis left upper eyelid: Secondary | ICD-10-CM | POA: Diagnosis not present

## 2014-02-21 ENCOUNTER — Ambulatory Visit: Payer: Medicare Other | Admitting: Cardiovascular Disease

## 2014-02-26 DIAGNOSIS — Z4889 Encounter for other specified surgical aftercare: Secondary | ICD-10-CM | POA: Diagnosis not present

## 2014-02-26 DIAGNOSIS — J449 Chronic obstructive pulmonary disease, unspecified: Secondary | ICD-10-CM | POA: Diagnosis not present

## 2014-02-26 DIAGNOSIS — Z9889 Other specified postprocedural states: Secondary | ICD-10-CM | POA: Diagnosis not present

## 2014-02-26 DIAGNOSIS — Z87891 Personal history of nicotine dependence: Secondary | ICD-10-CM | POA: Diagnosis not present

## 2014-02-26 DIAGNOSIS — K219 Gastro-esophageal reflux disease without esophagitis: Secondary | ICD-10-CM | POA: Diagnosis not present

## 2014-02-26 DIAGNOSIS — Z48810 Encounter for surgical aftercare following surgery on the sense organs: Secondary | ICD-10-CM | POA: Diagnosis not present

## 2014-02-26 DIAGNOSIS — D696 Thrombocytopenia, unspecified: Secondary | ICD-10-CM | POA: Diagnosis not present

## 2014-02-26 DIAGNOSIS — E785 Hyperlipidemia, unspecified: Secondary | ICD-10-CM | POA: Diagnosis not present

## 2014-02-27 ENCOUNTER — Ambulatory Visit (HOSPITAL_BASED_OUTPATIENT_CLINIC_OR_DEPARTMENT_OTHER): Payer: Medicare Other | Admitting: Lab

## 2014-02-27 ENCOUNTER — Encounter: Payer: Self-pay | Admitting: Hematology & Oncology

## 2014-02-27 ENCOUNTER — Ambulatory Visit (HOSPITAL_BASED_OUTPATIENT_CLINIC_OR_DEPARTMENT_OTHER): Payer: Medicare Other

## 2014-02-27 ENCOUNTER — Ambulatory Visit (HOSPITAL_BASED_OUTPATIENT_CLINIC_OR_DEPARTMENT_OTHER): Payer: Medicare Other | Admitting: Hematology & Oncology

## 2014-02-27 VITALS — BP 123/67 | HR 80 | Resp 20

## 2014-02-27 VITALS — BP 116/52 | HR 99 | Temp 98.0°F | Resp 18 | Ht 70.0 in | Wt 236.0 lb

## 2014-02-27 DIAGNOSIS — D751 Secondary polycythemia: Secondary | ICD-10-CM

## 2014-02-27 DIAGNOSIS — L218 Other seborrheic dermatitis: Secondary | ICD-10-CM | POA: Diagnosis not present

## 2014-02-27 DIAGNOSIS — D2372 Other benign neoplasm of skin of left lower limb, including hip: Secondary | ICD-10-CM | POA: Diagnosis not present

## 2014-02-27 DIAGNOSIS — D696 Thrombocytopenia, unspecified: Secondary | ICD-10-CM | POA: Diagnosis not present

## 2014-02-27 DIAGNOSIS — D2261 Melanocytic nevi of right upper limb, including shoulder: Secondary | ICD-10-CM | POA: Diagnosis not present

## 2014-02-27 DIAGNOSIS — D45 Polycythemia vera: Secondary | ICD-10-CM

## 2014-02-27 DIAGNOSIS — L299 Pruritus, unspecified: Secondary | ICD-10-CM | POA: Diagnosis not present

## 2014-02-27 DIAGNOSIS — L570 Actinic keratosis: Secondary | ICD-10-CM | POA: Diagnosis not present

## 2014-02-27 DIAGNOSIS — D225 Melanocytic nevi of trunk: Secondary | ICD-10-CM | POA: Diagnosis not present

## 2014-02-27 DIAGNOSIS — Z85828 Personal history of other malignant neoplasm of skin: Secondary | ICD-10-CM | POA: Diagnosis not present

## 2014-02-27 DIAGNOSIS — D509 Iron deficiency anemia, unspecified: Secondary | ICD-10-CM

## 2014-02-27 LAB — CBC WITH DIFFERENTIAL (CANCER CENTER ONLY)
BASO#: 0.1 10*3/uL (ref 0.0–0.2)
BASO%: 1.1 % (ref 0.0–2.0)
EOS%: 2.5 % (ref 0.0–7.0)
Eosinophils Absolute: 0.1 10*3/uL (ref 0.0–0.5)
HEMATOCRIT: 45 % (ref 38.7–49.9)
HEMOGLOBIN: 14.5 g/dL (ref 13.0–17.1)
LYMPH#: 1.2 10*3/uL (ref 0.9–3.3)
LYMPH%: 20.6 % (ref 14.0–48.0)
MCH: 26.3 pg — ABNORMAL LOW (ref 28.0–33.4)
MCHC: 32.2 g/dL (ref 32.0–35.9)
MCV: 82 fL (ref 82–98)
MONO#: 0.3 10*3/uL (ref 0.1–0.9)
MONO%: 4.6 % (ref 0.0–13.0)
NEUT%: 71.2 % (ref 40.0–80.0)
NEUTROS ABS: 4 10*3/uL (ref 1.5–6.5)
Platelets: 128 10*3/uL — ABNORMAL LOW (ref 145–400)
RBC: 5.51 10*6/uL (ref 4.20–5.70)
RDW: 14.9 % (ref 11.1–15.7)
WBC: 5.6 10*3/uL (ref 4.0–10.0)

## 2014-02-27 LAB — CHCC SATELLITE - SMEAR

## 2014-02-27 NOTE — Progress Notes (Signed)
Modesta Messing presents today for phlebotomy per MD orders. Phlebotomy procedure started at 1507 and ended at 1535. Approximately 400 mls removed. Patient observed for 30 minutes after procedure without any incident. Patient tolerated procedure well. First needle clotted, recannulated in left antecubital with fair results.  Needle clotted after 400 ml removal. IV needle removed intact.

## 2014-02-27 NOTE — Patient Instructions (Signed)

## 2014-02-27 NOTE — Progress Notes (Signed)
Hematology and Oncology Follow Up Visit  Connor [name] [id number] 1945-04-29 68 y.o. 02/27/2014   Principle Diagnosis:   Polycythemia vera-JAK2 negative  Chronic immune thrombocytopenia  Current Therapy:    Phlebotomy to maintain hematocrit below 45%  Aspirin 81 mg by mouth daily     Interim History:  [name] is back for followup. He is doing very well.he really has had no complaints..  He does have a new CPAP. This does seem to help him a little bit.there is still some issues with how well it fits.  He's had no problems with iron overload. we last saw him in September, and his ferritin was 8. His iron saturation was 14%.  He says that he is does not itch all that much.  He has had no fever. He has had no nausea or vomiting. There has been no change in bowel or bladder habits. He's not had any problems with leg swelling. There's been no bleeding.   Medications: Current outpatient prescriptions: alfuzosin (UROXATRAL) 10 MG 24 hr tablet, Take 10 mg by mouth every morning. , Disp: , Rfl: ;  aspirin 81 MG tablet, Take 81 mg by mouth 2 (two) times daily. , Disp: , Rfl: ;  celecoxib (CELEBREX) 200 MG capsule, 1 TABLET DAILY -----  PT D/C ON HIS OWN  01-10-14, Disp: , Rfl: ;  Cholecalciferol (VITAMIN D-3) 5000 UNITS TABS, Take 1 tablet by mouth daily. , Disp: , Rfl:  Coenzyme Q10 (CO Q 10 PO), Take 1 capsule by mouth daily. , Disp: , Rfl: ;  fish oil-omega-3 fatty acids 1000 MG capsule, Take 2 g by mouth daily. , Disp: , Rfl: ;  Glucosamine-Chondroit-Vit C-Mn (GLUCOSAMINE CHONDR 1500 COMPLX) CAPS, Take by mouth every morning., Disp: , Rfl: ;  Ipratropium-Albuterol (COMBIVENT) 20-100 MCG/ACT AERS respimat, Inhale 1 puff into the lungs as needed for wheezing or shortness of breath., Disp: , Rfl:  losartan (COZAAR) 25 MG tablet, Take 25 mg by mouth daily., Disp: , Rfl: ;  Multiple Vitamins-Minerals (CENTRUM SILVER ULTRA MENS) TABS, Take 1 tablet by mouth every morning. Once a day, Disp: ,  Rfl: ;  polyethylene glycol (MIRALAX / GLYCOLAX) packet, Take 17 g by mouth as needed. , Disp: , Rfl: ;  Probiotic Product ([place]), Take 1 capsule by mouth daily. , Disp: , Rfl:  pyridOXINE (VITAMIN B-6) 100 MG tablet, Take 100 mg by mouth daily. , Disp: , Rfl: ;  Turmeric 500 MG CAPS, Take by mouth every morning., Disp: , Rfl: ;  ZETIA 10 MG tablet, TAKE 1 TABLET (10 MG TOTAL) BY MOUTH DAILY., Disp: 30 tablet, Rfl: 5  Allergies:  Allergies  Allergen Reactions  . Dexlansoprazole Diarrhea and Nausea Only  . Dexlansoprazole Diarrhea and Nausea Only    unknown    Past Medical History, Surgical history, Social history, and Family History were reviewed and updated.  Review of Systems: As above  Physical Exam:  height is 5\' 10"  (1.778 m) and weight is 236 lb (107.049 kg). His oral temperature is 98 F (36.7 C). His blood pressure is 116/52 and his pulse is 99. His respiration is 18.   Well-developed and well-nourished white gentleman. Head and neck exam shows no ocular or oral lesions.  He has no palpable cervical or supraclavicular lymph nodes. Lungs are clear. Cardiac exam regular rate and rhythm with no murmurs rubs or bruits. Abdomen is soft.  He has good bowel sounds. There is no fluid wave. There is no  palpable liver or spleen tip. Extremities shows no clubbing cyanosis or edema. Strength is good bilaterally. Skin exam shows no rashes ecchymoses or petechia. Neurological exam is nonfocal.  Lab Results  Component Value Date   WBC 5.6 02/27/2014   HGB 14.5 02/27/2014   HCT 45.0 02/27/2014   MCV 82 02/27/2014   PLT 128* 02/27/2014     Chemistry      Component Value Date/Time   NA 144 02/11/2014 0849   NA 139 01/17/2014 1344   NA 142 08/27/2013 0852   K 4.1 02/11/2014 0849   K 4.0 01/17/2014 1344   CL 110 02/11/2014 0849   CL 103 01/17/2014 1344   CO2 27 02/11/2014 0849   CO2 27 01/17/2014 1344   BUN 17 02/11/2014 0849   BUN 18 01/17/2014 1344   BUN 20  08/27/2013 0852   CREATININE 1.02 02/11/2014 0849   CREATININE 1.14 09/06/2013 1350      Component Value Date/Time   CALCIUM 9.2 02/11/2014 0849   CALCIUM 9.2 01/17/2014 1344   ALKPHOS 66 02/11/2014 0849   ALKPHOS 48 01/17/2014 1344   AST 15 02/11/2014 0849   AST 23 01/17/2014 1344   ALT 11 02/11/2014 0849   ALT 18 01/17/2014 1344   BILITOT 0.6 02/11/2014 0849   BILITOT 0.70 01/17/2014 1344         Impression and Plan: Connor [name] is a 68 year old gentleman with polycythemia. We will go ahead and phlebotomize him today. His hematocrit is 45%. He has not been phlebotomize for at least 3 or 4 months.  His thrombocytopenia is not a problem. His platelet count is down a little bit but still good enough that he should not have any issues with bleeding.  I did send off his blood for Calreticulin evaluation.we will see if there is a mutation in the Calreticulin gene.  We will get him back in another 2 months.  Connor Napoleon, MD 11/18/20153:03 PM

## 2014-02-28 ENCOUNTER — Other Ambulatory Visit: Payer: Self-pay | Admitting: Family Medicine

## 2014-02-28 LAB — FERRITIN CHCC: FERRITIN: 15 ng/mL — AB (ref 22–316)

## 2014-02-28 LAB — IRON AND TIBC CHCC
%SAT: 19 % — ABNORMAL LOW (ref 20–55)
Iron: 66 ug/dL (ref 42–163)
TIBC: 355 ug/dL (ref 202–409)
UIBC: 289 ug/dL (ref 117–376)

## 2014-03-08 ENCOUNTER — Encounter: Payer: Self-pay | Admitting: Hematology & Oncology

## 2014-03-11 ENCOUNTER — Other Ambulatory Visit: Payer: Self-pay | Admitting: Hematology & Oncology

## 2014-03-11 DIAGNOSIS — D45 Polycythemia vera: Secondary | ICD-10-CM

## 2014-03-11 NOTE — Addendum Note (Signed)
Addended by: Burney Gauze R on: 03/11/2014 03:54 PM   Modules accepted: Orders

## 2014-03-13 ENCOUNTER — Other Ambulatory Visit: Payer: Self-pay | Admitting: *Deleted

## 2014-03-13 DIAGNOSIS — D45 Polycythemia vera: Secondary | ICD-10-CM

## 2014-03-14 ENCOUNTER — Encounter: Payer: Self-pay | Admitting: Hematology & Oncology

## 2014-03-21 LAB — CALR MUTATAION(GENPATH)

## 2014-04-01 ENCOUNTER — Encounter: Payer: Self-pay | Admitting: *Deleted

## 2014-04-10 ENCOUNTER — Telehealth: Payer: Self-pay | Admitting: Cardiovascular Disease

## 2014-04-10 NOTE — Telephone Encounter (Signed)
Closed encounter °

## 2014-04-11 ENCOUNTER — Encounter: Payer: Self-pay | Admitting: Cardiovascular Disease

## 2014-04-11 ENCOUNTER — Ambulatory Visit: Payer: Medicare Other | Admitting: Cardiovascular Disease

## 2014-04-11 ENCOUNTER — Ambulatory Visit (INDEPENDENT_AMBULATORY_CARE_PROVIDER_SITE_OTHER): Payer: Medicare Other | Admitting: Cardiovascular Disease

## 2014-04-11 VITALS — BP 124/64 | HR 86 | Ht 71.0 in | Wt 238.3 lb

## 2014-04-11 DIAGNOSIS — D696 Thrombocytopenia, unspecified: Secondary | ICD-10-CM | POA: Diagnosis not present

## 2014-04-11 DIAGNOSIS — E785 Hyperlipidemia, unspecified: Secondary | ICD-10-CM | POA: Diagnosis not present

## 2014-04-11 DIAGNOSIS — Z8249 Family history of ischemic heart disease and other diseases of the circulatory system: Secondary | ICD-10-CM | POA: Diagnosis not present

## 2014-04-11 DIAGNOSIS — I10 Essential (primary) hypertension: Secondary | ICD-10-CM | POA: Diagnosis not present

## 2014-04-11 NOTE — Patient Instructions (Signed)
Your physician recommends that you schedule a follow-up appointment in: One year.  

## 2014-04-12 ENCOUNTER — Encounter: Payer: Self-pay | Admitting: Cardiovascular Disease

## 2014-04-12 DIAGNOSIS — E785 Hyperlipidemia, unspecified: Secondary | ICD-10-CM | POA: Insufficient documentation

## 2014-04-12 NOTE — Progress Notes (Signed)
Patient ID: Connor [name], male   DOB: 09-17-1945, 69 y.o.   MRN: [id number]    HPI: Connor [name] is a 69 y.o. male who presents to the office for one year cardiology evaluation.   Connor [name] has a history of significant hyperlipidemia and remotely had been on combination therapy. He had taken Niaspan for some time and more recently had taken Zetia as well as Crestor.  However, recently he had been on Zetia10 mg in addition to 2 g of fish oil daily.  He has a strong family history for coronary artery disease and 2 of his younger brothers have had previous  coronary artery stenting. He  has a history of GERD and is status post laparoscopic Nissen fundoplication by Dr. Lilia Pro due to hiatal hernia severe GERD. An echo Doppler study in November 2013 showed an ejection fraction > 55% with grade 1 diastolic dysfunction. He had mild left atrial enlargement and evidence for mild aortic valve sclerosis. He underwent carotid duplex imaging which was normal. A nuclear perfusion study showed normal perfusion and function.  When I saw him last year he hadmicrocytic indices and was found to be iron deficient. He also subsequently developed thrombocytopenia and has been under the care of [name].  He has been diagnosed with polycythemia vera-JAK2 negative and chronic immune thrombocytopenia.  He apparently did receive IV iron therapy, and underwent ultrasound of the spleen as well as bone marrow. He also has undergone colonoscopy. He tells me his platelet count had dropped to 73,000 but recent laboratory from 01/17/2014 demonstrates a platelet count of 152,000.    From a cardiac standpoint, he denies chest pain. He is unaware of palpitations. There is no presyncope or syncope.  He remains active but has noticed some mild shortness of breath with activity.  He also is on CPAP therapy for obstructive sleep apnea.  I saw him several months ago for a year follow-up evaluation, I added losartan at just 25 mg for mild  hypertension, diastolic dysfunction, as well, as to see if this would improve his shortness of breath with activity.  His previous ankle edema had improved with discontinuance of Celebrex.  He underwent eye surgery.  He also underwent a follow-up echo Doppler study on 02/11/2014 showed an ejection fraction at 50-55% without regional wall motion abnormalities, but with continued grade 1 diastolic dysfunction.  The left atrium was mildly dilated.  He presents for follow-up evaluation.  Past Medical History  Diagnosis Date  . Hyperlipemia   . Nephrolithiasis   . Carotid artery stenosis   . Cough 05/26/2007  . COPD, mild 05/26/2007    PFT 09/09/11>>FEV1 3.06 (95%), FEV1% 69, FEF 25-75% 1.70 (59%), TLC 7.67 (109%), DLCO 108%, no BD    . GERD (gastroesophageal reflux disease)   . Shortness of breath     with exertion   . Sleep apnea     Cpap-   . Cancer     squamous cell on nose , inside nose   . Erectile dysfunction   . Peyronie's disease   . Anemia   . BPH (benign prostatic hyperplasia)   . Cardiomegaly   . H/O vitamin D deficiency   . Thrombocytopenia   . Iron deficiency anemia, unspecified 09/01/2012  . Polycythemia vera(238.4) 04/19/2013  . Hx of echocardiogram 02/15/2012    This showed mild concentric LVH. EF > 55%. H edid have grade 1 diastolic dysfunction with mitral valve E:A ratio of 0.81, his left atrium was mildly dilated  by volume assessment at 33.3 mL/m2. He did have mild tricupid regurgitation with upper normal RV systolic pressure at 78EU. He had aortic valve sclerosis without stenosis.  [name] History of nuclear stress test     this revealed normal myocardial perfusion and function. Post stress EF was 57%. There was no region of scar or ischemia.    Past Surgical History  Procedure Laterality Date  . Appendectomy    . Knee surgery      right  . Shoulder surgery      left  . Nose surgery  2013  . Laparoscopic nissen fundoplication  2/35/3614    Procedure: LAPAROSCOPIC NISSEN  FUNDOPLICATION;  Surgeon: Pedro Earls, MD;  Location: [facility];  Service: General;  Laterality: N/A;  . Hiatal hernia repair  11/24/2011    Procedure: LAPAROSCOPIC REPAIR OF HIATAL HERNIA;  Surgeon: Pedro Earls, MD;  Location: [facility];  Service: General;;    Allergies  Allergen Reactions  . Dexlansoprazole Diarrhea and Nausea Only  . Dexlansoprazole Diarrhea and Nausea Only    unknown    Current Outpatient Prescriptions  Medication Sig Dispense Refill  . alfuzosin (UROXATRAL) 10 MG 24 hr tablet Take 10 mg by mouth every morning.     [name] aspirin 81 MG tablet Take 81 mg by mouth 2 (two) times daily.     . Cholecalciferol (VITAMIN D-3) 5000 UNITS TABS Take 1 tablet by mouth daily.     . Coenzyme Q10 (CO Q 10 PO) Take 1 capsule by mouth daily.     . fish oil-omega-3 fatty acids 1000 MG capsule Take 2 g by mouth daily.     . Glucosamine-Chondroit-Vit C-Mn (GLUCOSAMINE CHONDR 1500 COMPLX) CAPS Take by mouth every morning.    . Ipratropium-Albuterol (COMBIVENT) 20-100 MCG/ACT AERS respimat Inhale 1 puff into the lungs as needed for wheezing or shortness of breath.    . losartan (COZAAR) 25 MG tablet Take 25 mg by mouth daily.    . Multiple Vitamins-Minerals (CENTRUM SILVER ULTRA MENS) TABS Take 1 tablet by mouth every morning. Once a day    . polyethylene glycol (MIRALAX / GLYCOLAX) packet Take 17 g by mouth as needed.     . Probiotic Product (PHILLIPS COLON HEALTH PO) Take 1 capsule by mouth daily.     [name] pyridOXINE (VITAMIN B-6) 100 MG tablet Take 100 mg by mouth daily.     . Turmeric 500 MG CAPS Take by mouth every morning.    [name] ZETIA 10 MG tablet TAKE 1 TABLET (10 MG TOTAL) BY MOUTH DAILY. 30 tablet 5   No current facility-administered medications for this visit.    History   Social History  . Marital Status: Married    Spouse Name: N/A    Number of Children: 3  . Years of Education: N/A   Occupational History  . retired    Social History Main Topics  . Smoking status:  Former Smoker -- 0.25 packs/day for 11 years    Types: Cigarettes    Start date: 05/17/1958    Quit date: 04/12/1966  . Smokeless tobacco: Never Used     Comment: quit smoking in 1968  . Alcohol Use: Yes     Comment: seldom  . Drug Use: No  . Sexual Activity: Not on file   Other Topics Concern  . Not on file   Social History Narrative    Family History  Problem Relation Age of Onset  . Heart disease Mother   .  Diabetes Mother   . Heart disease Father   . Heart disease Sister   . Heart disease Brother   . Diabetes Brother   . Colon cancer Neg Hx   . Esophageal cancer Neg Hx   . Rectal cancer Neg Hx   . Stomach cancer Neg Hx   . Liver cancer Paternal Grandfather   . Throat cancer Maternal Grandfather    Social history is notable in that he is married has 3 children 5 grandchildren and 3 stepgrandchildren. He is retired. No tobacco history. He does drink rare wine. He exercises several days per week for 30-45 minutes.    ROS General: Negative; No fevers, chills, or night sweats;  HEENT: Positive for IIb.  Eyelids No changes in vision or hearing, sinus congestion, difficulty swallowing Pulmonary: Negative; No cough, wheezing, shortness of breath, hemoptysis Cardiovascular: Negative; No chest pain, presyncope, syncope, palpitations GI: Positive for GERD; No nausea, vomiting, diarrhea, or abdominal pain GU: Negative; No dysuria, hematuria, or difficulty voiding Musculoskeletal: Negative; no myalgias, joint pain, or weakness Hematologic/Oncology: Positive for polycythemia vera for which he undergoes phlebotomy to keep his hematocrit less than 45; chronic immune thrombocytopenia; no easy bruising, bleeding Endocrine: Negative; no heat/cold intolerance; no diabetes Neuro: Negative; no changes in balance, headaches Skin: Negative; No rashes or skin lesions Psychiatric: Negative; No behavioral problems, depression Sleep: Positive for obstructive sleep apnea on CPAP therapy No  snoring, daytime sleepiness, hypersomnolence, bruxism, restless legs, hypnogognic hallucinations, no cataplexy Other comprehensive 14 point system review is negative.   PE BP 124/64 mmHg  Pulse 86  Ht 5\' 11"  (1.803 m)  Wt 238 lb 4.8 oz (108.092 kg)  BMI 33.25 kg/m2  General: Alert, oriented, no distress.  Skin: normal turgor, no rashes HEENT: Normocephalic, atraumatic. Pupils round and reactive; sclera anicteric; mildly droopy eyelids Nose without nasal septal hypertrophy Mouth/Parynx benign; Mallinpatti scale 2/3 Neck: No JVD, no carotid bruits Lungs: clear to ausculatation and percussion; no wheezing or rales Chest wall: Nontender to palpation Heart: RRR, s1 s2 normal 1/6 systolic murmur.  No diastolic murmur.  No rubs, thrills or heaves Abdomen: soft, nontender; no hepatosplenomehaly, BS+; abdominal aorta nontender and not dilated by palpation. Back: No CVA tenderness Pulses 2+ Extremities: Trivial ankle edema ,no clubbing cyanosis, Homan's sign negative  Neurologic: grossly nonfocal Psychologic: normal affect and mood.  ECG (independently read by me): Normal sinus rhythm at 86 bpm.  Q wave in lead 3.  No ST segment changes.  October 2015 ECG (independently read by me): Normal sinus rhythm at 77 bpm.  No ectopy.  Normal intervals.  November 2014 ECG: Sinus rhythm at 77 beats per minute. No change.  LABS:  BMET    Component Value Date/Time   NA 144 02/11/2014 0849   NA 139 01/17/2014 1344   NA 142 08/27/2013 0852   K 4.1 02/11/2014 0849   K 4.0 01/17/2014 1344   CL 110 02/11/2014 0849   CL 103 01/17/2014 1344   CO2 27 02/11/2014 0849   CO2 27 01/17/2014 1344   GLUCOSE 105* 02/11/2014 0849   GLUCOSE 100 01/17/2014 1344   GLUCOSE 104* 08/27/2013 0852   BUN 17 02/11/2014 0849   BUN 18 01/17/2014 1344   BUN 20 08/27/2013 0852   CREATININE 1.02 02/11/2014 0849   CREATININE 1.14 09/06/2013 1350   CALCIUM 9.2 02/11/2014 0849   CALCIUM 9.2 01/17/2014 1344   GFRNONAA  85 08/27/2013 0852   GFRAA 98 08/27/2013 0852     Hepatic Function Panel  Component Value Date/Time   PROT 6.3 02/11/2014 0849   PROT 6.3* 01/17/2014 1344   PROT 6.0 08/27/2013 0852   ALBUMIN 4.1 02/11/2014 0849   AST 15 02/11/2014 0849   AST 23 01/17/2014 1344   ALT 11 02/11/2014 0849   ALT 18 01/17/2014 1344   ALKPHOS 66 02/11/2014 0849   ALKPHOS 48 01/17/2014 1344   BILITOT 0.6 02/11/2014 0849   BILITOT 0.70 01/17/2014 1344   BILIDIR 0.17 08/27/2013 0852   IBILI 0.6 07/20/2012 0947     CBC    Component Value Date/Time   WBC 5.6 02/27/2014 1318   WBC 5.0 08/27/2013 0913   WBC 4.9 07/28/2012 0725   RBC 5.51 02/27/2014 1318   RBC 5.02 09/06/2013 1350   RBC 5.5 08/27/2013 0913   RBC 5.52 07/28/2012 0725   HGB 14.5 02/27/2014 1318   HGB 14.8 08/27/2013 0913   HGB 16.2 07/28/2012 0725   HCT 45.0 02/27/2014 1318   HCT 47.5 08/27/2013 0913   HCT 47.0 07/28/2012 0725   PLT 128* 02/27/2014 1318   PLT 124* 07/28/2012 0725   MCV 82 02/27/2014 1318   MCV 86.9 08/27/2013 0913   MCV 85.1 07/28/2012 0725   MCH 26.3* 02/27/2014 1318   MCH 27.1 08/27/2013 0913   MCH 29.3 07/28/2012 0725   MCHC 32.2 02/27/2014 1318   MCHC 31.1* 08/27/2013 0913   MCHC 34.5 07/28/2012 0725   RDW 14.9 02/27/2014 1318   RDW 14.1 07/28/2012 0725   LYMPHSABS 1.2 02/27/2014 1318   LYMPHSABS 0.8 11/25/2011 0330   MONOABS 0.7 11/25/2011 0330   EOSABS 0.1 02/27/2014 1318   EOSABS 0.0 11/25/2011 0330   BASOSABS 0.1 02/27/2014 1318   BASOSABS 0.0 11/25/2011 0330     BNP No results found for: PROBNP  Lipid Panel     Component Value Date/Time   CHOL 133 02/11/2014 0849   TRIG 100 02/11/2014 0849   TRIG 81 08/27/2013 0852   TRIG 128 07/20/2012 0947   HDL 33* 02/11/2014 0849   HDL 35* 08/27/2013 0852   CHOLHDL 4.0 02/11/2014 0849   VLDL 20 02/11/2014 0849   LDLCALC 80 02/11/2014 0849   LDLCALC 82 08/27/2013 0852   LDLCALC 93 07/20/2012 0947     RADIOLOGY: No results  found.    ASSESSMENT AND PLAN: Mr. Kinne is a 69 year old gentleman who has been on losartan 25 mg, which was initiated at his last office visit for blood pressure control as well as potential improvement in diastolic dysfunction.  His repeat echo Doppler study was reviewed with him and confirms low-normal systolic function with normal wall motion, but with grade 1 diastolic dysfunction.  His blood pressure is significantly improved today at 124/64.  He is mildly obese with a body mass index of 33.25 and weight loss was also recommended.  He now is on Zetia in addition to fish oil 2 g daily for hyperlipidemia and his most recent LDL cholesterol is 80 and total cholesterol 133.  His HDL remains mildly low.  In the past, he had been on combination therapy as well as statin therapy.  He is scheduled to undergo a follow-up evaluation with Dr. Lutricia Feil for his polycythemia vera and immune thrombocytopenia and undergoes phlebotomy to maintain hematocrit below 45%.  He is tolerating aspirin 81 mg daily.  Cardiac wise he is stable on current therapy.  I will see him in one year for reevaluation or sooner if problems arise.  Troy Sine, MD, [facility]  04/12/2014 6:48  PM

## 2014-04-24 ENCOUNTER — Encounter: Payer: Self-pay | Admitting: Hematology & Oncology

## 2014-04-24 ENCOUNTER — Ambulatory Visit (HOSPITAL_BASED_OUTPATIENT_CLINIC_OR_DEPARTMENT_OTHER): Payer: Medicare Other | Admitting: Hematology & Oncology

## 2014-04-24 ENCOUNTER — Other Ambulatory Visit (HOSPITAL_BASED_OUTPATIENT_CLINIC_OR_DEPARTMENT_OTHER): Payer: Medicare Other | Admitting: Lab

## 2014-04-24 VITALS — BP 118/58 | HR 86 | Temp 97.5°F | Resp 18 | Ht 70.0 in | Wt 237.0 lb

## 2014-04-24 DIAGNOSIS — D509 Iron deficiency anemia, unspecified: Secondary | ICD-10-CM | POA: Diagnosis not present

## 2014-04-24 DIAGNOSIS — D45 Polycythemia vera: Secondary | ICD-10-CM

## 2014-04-24 LAB — COMPREHENSIVE METABOLIC PANEL
ALK PHOS: 61 U/L (ref 39–117)
ALT: 11 U/L (ref 0–53)
AST: 17 U/L (ref 0–37)
Albumin: 4 g/dL (ref 3.5–5.2)
BUN: 16 mg/dL (ref 6–23)
CHLORIDE: 108 meq/L (ref 96–112)
CO2: 22 meq/L (ref 19–32)
CREATININE: 0.92 mg/dL (ref 0.50–1.35)
Calcium: 8.9 mg/dL (ref 8.4–10.5)
GLUCOSE: 118 mg/dL — AB (ref 70–99)
Potassium: 4 mEq/L (ref 3.5–5.3)
Sodium: 140 mEq/L (ref 135–145)
Total Bilirubin: 0.4 mg/dL (ref 0.2–1.2)
Total Protein: 6.3 g/dL (ref 6.0–8.3)

## 2014-04-24 LAB — CBC WITH DIFFERENTIAL (CANCER CENTER ONLY)
BASO#: 0 10*3/uL (ref 0.0–0.2)
BASO%: 0.6 % (ref 0.0–2.0)
EOS%: 2.4 % (ref 0.0–7.0)
Eosinophils Absolute: 0.1 10*3/uL (ref 0.0–0.5)
HCT: 41.5 % (ref 38.7–49.9)
HGB: 13.2 g/dL (ref 13.0–17.1)
LYMPH#: 1 10*3/uL (ref 0.9–3.3)
LYMPH%: 19.4 % (ref 14.0–48.0)
MCH: 25.9 pg — AB (ref 28.0–33.4)
MCHC: 31.8 g/dL — AB (ref 32.0–35.9)
MCV: 81 fL — ABNORMAL LOW (ref 82–98)
MONO#: 0.4 10*3/uL (ref 0.1–0.9)
MONO%: 8.7 % (ref 0.0–13.0)
NEUT%: 68.9 % (ref 40.0–80.0)
NEUTROS ABS: 3.5 10*3/uL (ref 1.5–6.5)
PLATELETS: 136 10*3/uL — AB (ref 145–400)
RBC: 5.1 10*6/uL (ref 4.20–5.70)
RDW: 14.8 % (ref 11.1–15.7)
WBC: 5 10*3/uL (ref 4.0–10.0)

## 2014-04-24 LAB — IRON AND TIBC CHCC
%SAT: 9 % — ABNORMAL LOW (ref 20–55)
Iron: 36 ug/dL — ABNORMAL LOW (ref 42–163)
TIBC: 388 ug/dL (ref 202–409)
UIBC: 352 ug/dL (ref 117–376)

## 2014-04-24 LAB — FERRITIN CHCC: Ferritin: 6 ng/ml — ABNORMAL LOW (ref 22–316)

## 2014-04-24 NOTE — Progress Notes (Signed)
Hematology and Oncology Follow Up Visit  Connor [name] [id number] 1946/01/29 69 y.o. 04/24/2014   Principle Diagnosis:   Polycythemia vera-Triple negative  Chronic immune thrombocytopenia  Current Therapy:    Phlebotomy to maintain hematocrit below 45%  Aspirin 81 mg by mouth daily     Interim History:  [name] is back for followup. He had a good holiday. He had a good Thanksgiving and Christmas.  He's had no problems with headache. He's had no fatigue. He's had no bleeding. He's had no nausea or vomiting.  There's been no rashes. He's had some pruritus but not as bad.  Of note, we did do Calreticulin and MPL 515 assays. These were negative for any mutations.  He's had no cough. There's been no pain. He's had no change in bowel or bladder habits.  Medications:  Current outpatient prescriptions:  .  alfuzosin (UROXATRAL) 10 MG 24 hr tablet, Take 10 mg by mouth every morning. , Disp: , Rfl:  .  aspirin 81 MG tablet, Take 81 mg by mouth 2 (two) times daily. , Disp: , Rfl:  .  Cholecalciferol (VITAMIN D-3) 5000 UNITS TABS, Take 1 tablet by mouth daily. , Disp: , Rfl:  .  Coenzyme Q10 (CO Q 10 PO), Take 1 capsule by mouth daily. , Disp: , Rfl:  .  fish oil-omega-3 fatty acids 1000 MG capsule, Take 2 g by mouth daily. , Disp: , Rfl:  .  Glucosamine-Chondroit-Vit C-Mn (GLUCOSAMINE CHONDR 1500 COMPLX) CAPS, Take by mouth every morning., Disp: , Rfl:  .  Ipratropium-Albuterol (COMBIVENT) 20-100 MCG/ACT AERS respimat, Inhale 1 puff into the lungs as needed for wheezing or shortness of breath., Disp: , Rfl:  .  losartan (COZAAR) 25 MG tablet, Take 25 mg by mouth daily., Disp: , Rfl:  .  Multiple Vitamins-Minerals (CENTRUM SILVER ULTRA MENS) TABS, Take 1 tablet by mouth every morning. Once a day, Disp: , Rfl:  .  polyethylene glycol (MIRALAX / GLYCOLAX) packet, Take 17 g by mouth as needed. , Disp: , Rfl:  .  Probiotic Product (PHILLIPS COLON HEALTH PO), Take 1 capsule by mouth  daily. , Disp: , Rfl:  .  pyridOXINE (VITAMIN B-6) 100 MG tablet, Take 100 mg by mouth daily. , Disp: , Rfl:  .  Turmeric 500 MG CAPS, Take by mouth every morning., Disp: , Rfl:  .  ZETIA 10 MG tablet, TAKE 1 TABLET (10 MG TOTAL) BY MOUTH DAILY., Disp: 30 tablet, Rfl: 5  Allergies:  Allergies  Allergen Reactions  . Dexlansoprazole Diarrhea and Nausea Only  . Dexlansoprazole Diarrhea and Nausea Only    unknown    Past Medical History, Surgical history, Social history, and Family History were reviewed and updated.  Review of Systems: As above  Physical Exam:  height is 5\' 10"  (1.778 m) and weight is 237 lb (107.502 kg). His oral temperature is 97.5 F (36.4 C). His blood pressure is 118/58 and his pulse is 86. His respiration is 18.   Well-developed and well-nourished white gentleman. Head and neck exam shows no ocular or oral lesions.  He has no palpable cervical or supraclavicular lymph nodes. Lungs are clear. Cardiac exam regular rate and rhythm with no murmurs rubs or bruits. Abdomen is soft.  He has good bowel sounds. There is no fluid wave. There is no palpable liver or spleen tip. Extremities shows no clubbing cyanosis or edema. Strength is good bilaterally. Skin exam shows no rashes ecchymoses or petechia. Neurological exam is nonfocal.  Lab Results  Component Value Date   WBC 5.0 04/24/2014   HGB 13.2 04/24/2014   HCT 41.5 04/24/2014   MCV 81* 04/24/2014   PLT 136* 04/24/2014     Chemistry      Component Value Date/Time   NA 144 02/11/2014 0849   NA 139 01/17/2014 1344   NA 142 08/27/2013 0852   K 4.1 02/11/2014 0849   K 4.0 01/17/2014 1344   CL 110 02/11/2014 0849   CL 103 01/17/2014 1344   CO2 27 02/11/2014 0849   CO2 27 01/17/2014 1344   BUN 17 02/11/2014 0849   BUN 18 01/17/2014 1344   BUN 20 08/27/2013 0852   CREATININE 1.02 02/11/2014 0849   CREATININE 1.14 09/06/2013 1350      Component Value Date/Time   CALCIUM 9.2 02/11/2014 0849   CALCIUM 9.2  01/17/2014 1344   ALKPHOS 66 02/11/2014 0849   ALKPHOS 48 01/17/2014 1344   AST 15 02/11/2014 0849   AST 23 01/17/2014 1344   ALT 11 02/11/2014 0849   ALT 18 01/17/2014 1344   BILITOT 0.6 02/11/2014 0849   BILITOT 0.70 01/17/2014 1344         Impression and Plan: Connor [name] is a 69 year old gentleman with polycythemia. His blood count looks great today. His hematocrit below 45% so we do not have to phlebotomize him.  I did send off his blood for Calreticulin evaluation.this was negative.  Also sent his blood off for MPL 515 evaluation. This also was negative.  For now, we will plan to get him back in another 2 months.  I'm glad to see that his reticulocyte count is a little bit better. Connor Napoleon, MD 1/13/20161:14 PM

## 2014-04-28 LAB — CALRETICULIN (CALR) MUTATION ANALYSIS: CALR Mutation (exon 9): NOT DETECTED

## 2014-05-02 DIAGNOSIS — M4806 Spinal stenosis, lumbar region: Secondary | ICD-10-CM | POA: Diagnosis not present

## 2014-05-02 DIAGNOSIS — M545 Low back pain: Secondary | ICD-10-CM | POA: Diagnosis not present

## 2014-05-13 ENCOUNTER — Encounter: Payer: Self-pay | Admitting: Family Medicine

## 2014-05-13 ENCOUNTER — Ambulatory Visit (INDEPENDENT_AMBULATORY_CARE_PROVIDER_SITE_OTHER): Payer: Medicare Other | Admitting: Family Medicine

## 2014-05-13 ENCOUNTER — Ambulatory Visit (INDEPENDENT_AMBULATORY_CARE_PROVIDER_SITE_OTHER): Payer: Medicare Other

## 2014-05-13 VITALS — BP 126/68 | HR 93 | Temp 97.5°F | Ht 70.0 in | Wt 240.0 lb

## 2014-05-13 DIAGNOSIS — J40 Bronchitis, not specified as acute or chronic: Secondary | ICD-10-CM | POA: Diagnosis not present

## 2014-05-13 DIAGNOSIS — R05 Cough: Secondary | ICD-10-CM

## 2014-05-13 DIAGNOSIS — I10 Essential (primary) hypertension: Secondary | ICD-10-CM | POA: Diagnosis not present

## 2014-05-13 DIAGNOSIS — R059 Cough, unspecified: Secondary | ICD-10-CM

## 2014-05-13 DIAGNOSIS — D45 Polycythemia vera: Secondary | ICD-10-CM

## 2014-05-13 DIAGNOSIS — D696 Thrombocytopenia, unspecified: Secondary | ICD-10-CM | POA: Diagnosis not present

## 2014-05-13 DIAGNOSIS — I517 Cardiomegaly: Secondary | ICD-10-CM

## 2014-05-13 DIAGNOSIS — J209 Acute bronchitis, unspecified: Secondary | ICD-10-CM

## 2014-05-13 DIAGNOSIS — D509 Iron deficiency anemia, unspecified: Secondary | ICD-10-CM | POA: Diagnosis not present

## 2014-05-13 LAB — POCT CBC
GRANULOCYTE PERCENT: 74.3 % (ref 37–80)
HCT, POC: 42.1 % — AB (ref 43.5–53.7)
Hemoglobin: 12.5 g/dL — AB (ref 14.1–18.1)
Lymph, poc: 1.1 (ref 0.6–3.4)
MCH, POC: 23.5 pg — AB (ref 27–31.2)
MCHC: 29.7 g/dL — AB (ref 31.8–35.4)
MCV: 79 fL — AB (ref 80–97)
MPV: 8.9 fL (ref 0–99.8)
POC Granulocyte: 4.3 (ref 2–6.9)
POC LYMPH PERCENT: 18.7 %L (ref 10–50)
Platelet Count, POC: 157 10*3/uL (ref 142–424)
RBC: 5.3 M/uL (ref 4.69–6.13)
RDW, POC: 15.7 %
WBC: 5.8 10*3/uL (ref 4.6–10.2)

## 2014-05-13 MED ORDER — HYDROCOD POLST-CHLORPHEN POLST 10-8 MG/5ML PO LQCR: ORAL | Status: DC

## 2014-05-13 MED ORDER — AMOXICILLIN 500 MG PO CAPS: 500.0000 mg | ORAL_CAPSULE | Freq: Three times a day (TID) | ORAL | Status: DC

## 2014-05-13 NOTE — Patient Instructions (Signed)
Continue to drink plenty of fluids Use a cool mist humidifier regularly in her bedroom at nighttime  keep the house as cool as possible Take antibiotic and take Mucinex for cough and congestion Use the Nettie pot or nasal saline Take Tylenol for aches pains and fever

## 2014-05-13 NOTE — Progress Notes (Signed)
Subjective:    Patient ID: Connor [name], male    DOB: 03-24-1946, 69 y.o.   MRN: [id number]  HPI Patient here today for cough and congestion that started about 2.5 weeks ago.         Patient Active Problem List   Diagnosis Date Noted  . Hyperlipidemia LDL goal <100 04/12/2014  . Polycythemia vera 04/19/2013  . Family history of early CAD 02/19/2013  . Iron deficiency anemia 09/01/2012  . H/O vitamin D deficiency 05/21/2012  . Diverticulosis of colon 05/21/2012  . Carotid artery stenosis, asymptomatic 05/21/2012  . BPH (benign prostatic hyperplasia)   . Cardiomegaly   . Thrombocytopenia   . Lap Nissen Fundoplication August 8588 11/26/2011  . Weight gain, abnormal 08/27/2009  . ESSENTIAL HYPERTENSION, BENIGN 08/27/2009  . OSA (obstructive sleep apnea) 02/19/2008  . COPD, mild 05/26/2007   Outpatient Encounter Prescriptions as of 05/13/2014  Medication Sig  . alfuzosin (UROXATRAL) 10 MG 24 hr tablet Take 10 mg by mouth every morning.   [name] aspirin 81 MG tablet Take 81 mg by mouth 2 (two) times daily.   . Cholecalciferol (VITAMIN D-3) 5000 UNITS TABS Take 1 tablet by mouth daily.   . Coenzyme Q10 (CO Q 10 PO) Take 1 capsule by mouth daily.   . fish oil-omega-3 fatty acids 1000 MG capsule Take 2 g by mouth daily.   . Glucosamine-Chondroit-Vit C-Mn (GLUCOSAMINE CHONDR 1500 COMPLX) CAPS Take by mouth every morning.  . Ipratropium-Albuterol (COMBIVENT) 20-100 MCG/ACT AERS respimat Inhale 1 puff into the lungs as needed for wheezing or shortness of breath.  . losartan (COZAAR) 25 MG tablet Take 25 mg by mouth daily.  . Multiple Vitamins-Minerals (CENTRUM SILVER ULTRA MENS) TABS Take 1 tablet by mouth every morning. Once a day  . polyethylene glycol (MIRALAX / GLYCOLAX) packet Take 17 g by mouth as needed.   . Probiotic Product (PHILLIPS COLON HEALTH PO) Take 1 capsule by mouth daily.   [name] pyridOXINE (VITAMIN B-6) 100 MG tablet Take 100 mg by mouth daily.   . Turmeric 500 MG CAPS  Take by mouth every morning.  [name] ZETIA 10 MG tablet TAKE 1 TABLET (10 MG TOTAL) BY MOUTH DAILY.    Review of Systems  Constitutional: Negative.   HENT: Positive for congestion.   Eyes: Negative.   Respiratory: Positive for cough.   Cardiovascular: Negative.   Gastrointestinal: Negative.   Endocrine: Negative.   Genitourinary: Negative.   Musculoskeletal: Negative.   Skin: Negative.   Allergic/Immunologic: Negative.   Neurological: Positive for dizziness and headaches.  Hematological: Negative.   Psychiatric/Behavioral: Negative.        Objective:   Physical Exam  Constitutional: He is oriented to person, place, and time. He appears well-developed and well-nourished. No distress.  HENT:  Head: Normocephalic and atraumatic.  Right Ear: External ear normal.  Left Ear: External ear normal.  Mouth/Throat: No oropharyngeal exudate.  Nasal congestion bilaterally and throat was slightly red posteriorly  Eyes: Conjunctivae and EOM are normal. Pupils are equal, round, and reactive to light. Right eye exhibits no discharge. Left eye exhibits no discharge. No scleral icterus.  Neck: Normal range of motion. Neck supple. No thyromegaly present.  Cardiovascular: Normal rate, regular rhythm, normal heart sounds and intact distal pulses.   No murmur heard. Pulmonary/Chest: Effort normal and breath sounds normal. No respiratory distress. He has no wheezes. He has no rales. He exhibits no tenderness.  Dry irritated cough  Abdominal: Soft. Bowel sounds are normal. He  exhibits no mass. There is no tenderness. There is no rebound and no guarding.  Musculoskeletal: Normal range of motion. He exhibits no edema.  Lymphadenopathy:    He has no cervical adenopathy.  Neurological: He is alert and oriented to person, place, and time.  Skin: Skin is warm and dry. No rash noted.  Psychiatric: He has a normal mood and affect. His behavior is normal. Judgment and thought content normal.  Nursing note and  vitals reviewed.  BP 126/68 mmHg  Pulse 93  Temp(Src) 97.5 F (36.4 C) (Oral)  Ht 5\' 10"  (1.778 m)  Wt 240 lb (108.863 kg)  BMI 34.44 kg/m2  [facility] reading (PRIMARY) by  Dr.Sativa Gelles-chest x-ray no active disease  Results for orders placed or performed in visit on 05/13/14  POCT CBC  Result Value Ref Range   WBC 5.8 4.6 - 10.2 K/uL   Lymph, poc 1.1 0.6 - 3.4   POC LYMPH PERCENT 18.7 10 - 50 %L   POC Granulocyte 4.3 2 - 6.9   Granulocyte percent 74.3 37 - 80 %G   RBC 5.3 4.69 - 6.13 M/uL   Hemoglobin 12.5 (A) 14.1 - 18.1 g/dL   HCT, POC 42.1 (A) 43.5 - 53.7 %   MCV 79.0 (A) 80 - 97 fL   MCH, POC 23.5 (A) 27 - 31.2 pg   MCHC 29.7 (A) 31.8 - 35.4 g/dL   RDW, POC 15.7 %   Platelet Count, POC 157.0 142 - 424 K/uL   MPV 8.9 0 - 99.8 fL   The patient was made aware of the chest x-ray and the CBC result before he left the office.                                       Assessment & Plan:  1. Cough - POCT CBC - DG Chest 2 View; Future - amoxicillin (AMOXIL) 500 MG capsule; Take 1 capsule (500 mg total) by mouth 3 (three) times daily.  Dispense: 30 capsule; Refill: 0 - chlorpheniramine-HYDROcodone (TUSSIONEX PENNKINETIC ER) 10-8 MG/5ML LQCR; 1 teaspoon at hs as needed for severe cough  Dispense: 100 mL; Refill: 0  2. Thrombocytopenia -Continue follow-up with hematology  3. Essential hypertension, benign  4. Cardiomegaly -To follow-up with cardiology  5. Polycythemia vera  6. Iron deficiency anemia   7. Bronchitis with bronchospasm -Take Mucinex and use cool mist humidification at home - amoxicillin (AMOXIL) 500 MG capsule; Take 1 capsule (500 mg total) by mouth 3 (three) times daily.  Dispense: 30 capsule; Refill: 0 - chlorpheniramine-HYDROcodone (TUSSIONEX PENNKINETIC ER) 10-8 MG/5ML LQCR; 1 teaspoon at hs as needed for severe cough  Dispense: 100 mL; Refill: 0   Patient Instructions  Continue to drink plenty of fluids Use a cool mist humidifier regularly in her bedroom  at nighttime  keep the house as cool as possible Take antibiotic and take Mucinex for cough and congestion Use the Nettie pot or nasal saline Take Tylenol for aches pains and fever    Arrie Senate MD

## 2014-05-16 ENCOUNTER — Other Ambulatory Visit: Payer: Self-pay | Admitting: Family

## 2014-05-23 ENCOUNTER — Telehealth: Payer: Self-pay | Admitting: Family Medicine

## 2014-05-23 MED ORDER — PREDNISONE 10 MG PO TABS: ORAL_TABLET | ORAL | Status: DC

## 2014-05-23 MED ORDER — LEVOFLOXACIN 500 MG PO TABS: 500.0000 mg | ORAL_TABLET | Freq: Every day | ORAL | Status: DC

## 2014-05-23 NOTE — Addendum Note (Signed)
Addended by: Zannie Cove on: 05/23/2014 03:58 PM   Modules accepted: Orders

## 2014-05-23 NOTE — Telephone Encounter (Signed)
With Connor [name], I think he can do the prednisone 10 Taper #20 tablets and continue to use Mucinex drink plenty of fluids and use a cool mist humidifier.

## 2014-05-23 NOTE — Telephone Encounter (Signed)
Add on note from [place] - add order for levaquin 500 qdx 7 days

## 2014-05-23 NOTE — Telephone Encounter (Signed)
Advised and medication sent to pharmacy.

## 2014-05-30 ENCOUNTER — Telehealth: Payer: Self-pay | Admitting: Family Medicine

## 2014-05-30 NOTE — Telephone Encounter (Signed)
Please schedule this patient and his wife to see the pulmonologist about this ongoing problem as to antibiotics and prednisone for him and none for her have made any difference with her symptoms. Please get this appointment as soon as possible

## 2014-05-30 NOTE — Telephone Encounter (Signed)
He will call pulmonary Dr that he has.

## 2014-06-03 DIAGNOSIS — H6983 Other specified disorders of Eustachian tube, bilateral: Secondary | ICD-10-CM | POA: Diagnosis not present

## 2014-06-06 DIAGNOSIS — Z9889 Other specified postprocedural states: Secondary | ICD-10-CM | POA: Diagnosis not present

## 2014-06-06 DIAGNOSIS — Z4889 Encounter for other specified surgical aftercare: Secondary | ICD-10-CM | POA: Diagnosis not present

## 2014-06-10 ENCOUNTER — Other Ambulatory Visit (INDEPENDENT_AMBULATORY_CARE_PROVIDER_SITE_OTHER): Payer: Medicare Other

## 2014-06-10 DIAGNOSIS — E785 Hyperlipidemia, unspecified: Secondary | ICD-10-CM | POA: Diagnosis not present

## 2014-06-10 DIAGNOSIS — I517 Cardiomegaly: Secondary | ICD-10-CM | POA: Diagnosis not present

## 2014-06-10 DIAGNOSIS — D509 Iron deficiency anemia, unspecified: Secondary | ICD-10-CM

## 2014-06-10 DIAGNOSIS — I10 Essential (primary) hypertension: Secondary | ICD-10-CM | POA: Diagnosis not present

## 2014-06-10 DIAGNOSIS — D696 Thrombocytopenia, unspecified: Secondary | ICD-10-CM

## 2014-06-10 DIAGNOSIS — Z8639 Personal history of other endocrine, nutritional and metabolic disease: Secondary | ICD-10-CM | POA: Diagnosis not present

## 2014-06-10 DIAGNOSIS — N400 Enlarged prostate without lower urinary tract symptoms: Secondary | ICD-10-CM | POA: Diagnosis not present

## 2014-06-10 LAB — POCT CBC
Granulocyte percent: 76.5 %G (ref 37–80)
HCT, POC: 48.3 % (ref 43.5–53.7)
Hemoglobin: 13.9 g/dL — AB (ref 14.1–18.1)
Lymph, poc: 1 (ref 0.6–3.4)
MCH, POC: 23.3 pg — AB (ref 27–31.2)
MCHC: 28.9 g/dL — AB (ref 31.8–35.4)
MCV: 80.6 fL (ref 80–97)
MPV: 10.4 fL (ref 0–99.8)
POC Granulocyte: 3.8 (ref 2–6.9)
POC LYMPH PERCENT: 19.8 %L (ref 10–50)
Platelet Count, POC: 156 10*3/uL (ref 142–424)
RBC: 5.98 M/uL (ref 4.69–6.13)
RDW, POC: 16.2 %
WBC: 5 10*3/uL (ref 4.6–10.2)

## 2014-06-10 NOTE — Progress Notes (Signed)
LAB ONLY 

## 2014-06-11 LAB — BMP8+EGFR
BUN / CREAT RATIO: 16 (ref 10–22)
BUN: 16 mg/dL (ref 8–27)
CO2: 24 mmol/L (ref 18–29)
CREATININE: 1 mg/dL (ref 0.76–1.27)
Calcium: 9.4 mg/dL (ref 8.6–10.2)
Chloride: 105 mmol/L (ref 97–108)
GFR calc non Af Amer: 76 mL/min/{1.73_m2} (ref >59)
GFR, EST AFRICAN AMERICAN: 88 mL/min/{1.73_m2} (ref >59)
GLUCOSE: 111 mg/dL — AB (ref 65–99)
Potassium: 4.5 mmol/L (ref 3.5–5.2)
Sodium: 143 mmol/L (ref 134–144)

## 2014-06-11 LAB — HEPATIC FUNCTION PANEL
ALK PHOS: 67 IU/L (ref 39–117)
ALT: 14 IU/L (ref 0–44)
AST: 19 IU/L (ref 0–40)
Albumin: 4 g/dL (ref 3.5–4.8)
BILIRUBIN, DIRECT: 0.13 mg/dL (ref 0.00–0.40)
Bilirubin Total: 0.6 mg/dL (ref 0.0–1.2)
Total Protein: 6.1 g/dL (ref 6.0–8.5)

## 2014-06-11 LAB — NMR, LIPOPROFILE
Cholesterol: 146 mg/dL (ref 100–199)
HDL Cholesterol by NMR: 34 mg/dL — ABNORMAL LOW (ref >39)
HDL Particle Number: 29.3 umol/L — ABNORMAL LOW (ref >=30.5)
LDL Particle Number: 1418 nmol/L — ABNORMAL HIGH (ref <1000)
LDL SIZE: 19.9 nm (ref >20.5)
LDL-C: 90 mg/dL (ref 0–99)
LP-IR SCORE: 76 — AB (ref <=45)
Small LDL Particle Number: 1014 nmol/L — ABNORMAL HIGH (ref <=527)
Triglycerides by NMR: 110 mg/dL (ref 0–149)

## 2014-06-12 ENCOUNTER — Telehealth: Payer: Self-pay | Admitting: *Deleted

## 2014-06-12 NOTE — Telephone Encounter (Signed)
-----   Message from [name], MD sent at 06/11/2014  7:42 AM EST ----- The blood sugar was slightly elevated at 111. The creatinine, the most important kidney function test was within normal limits. The electrolytes including potassium are within normal limits. With advanced lipid testing, the total LDL particle number was elevated at 1418. This is higher than it was 9 months ago when it was 1301. The LDL C was good. The good cholesterol or the HDL particle number remains low at 29.3. The triglycerides were good. The patient should continue with this area and with omega-3 fatty acids. He also should continue with as aggressive therapeutic lifestyle changes as possible which include diet and exercise All liver function tests are within normal limits Please ask him why he is unable to take statin drugs?++++

## 2014-06-12 NOTE — Telephone Encounter (Signed)
Pt notified of results Verbalizes understanding 

## 2014-06-17 ENCOUNTER — Ambulatory Visit (INDEPENDENT_AMBULATORY_CARE_PROVIDER_SITE_OTHER): Payer: Medicare Other | Admitting: Family Medicine

## 2014-06-17 ENCOUNTER — Encounter: Payer: Self-pay | Admitting: Family Medicine

## 2014-06-17 VITALS — BP 132/81 | HR 97 | Temp 97.3°F | Ht 70.0 in | Wt 238.0 lb

## 2014-06-17 DIAGNOSIS — D696 Thrombocytopenia, unspecified: Secondary | ICD-10-CM | POA: Diagnosis not present

## 2014-06-17 DIAGNOSIS — N400 Enlarged prostate without lower urinary tract symptoms: Secondary | ICD-10-CM | POA: Diagnosis not present

## 2014-06-17 DIAGNOSIS — I10 Essential (primary) hypertension: Secondary | ICD-10-CM

## 2014-06-17 DIAGNOSIS — Z8639 Personal history of other endocrine, nutritional and metabolic disease: Secondary | ICD-10-CM | POA: Diagnosis not present

## 2014-06-17 DIAGNOSIS — I517 Cardiomegaly: Secondary | ICD-10-CM

## 2014-06-17 DIAGNOSIS — E785 Hyperlipidemia, unspecified: Secondary | ICD-10-CM

## 2014-06-17 DIAGNOSIS — D509 Iron deficiency anemia, unspecified: Secondary | ICD-10-CM

## 2014-06-17 DIAGNOSIS — D649 Anemia, unspecified: Secondary | ICD-10-CM | POA: Diagnosis not present

## 2014-06-17 MED ORDER — IPRATROPIUM-ALBUTEROL 20-100 MCG/ACT IN AERS: 1.0000 | INHALATION_SPRAY | RESPIRATORY_TRACT | Status: DC | PRN

## 2014-06-17 NOTE — Patient Instructions (Addendum)
Medicare Annual Wellness Visit  [address] and the medical providers at [facility] strive to bring you the best medical care.  In doing so we not only want to address your current medical conditions and concerns but also to detect new conditions early and prevent illness, disease and health-related problems.    Medicare offers a yearly Wellness Visit which allows our clinical staff to assess your need for preventative services including immunizations, lifestyle education, counseling to decrease risk of preventable diseases and screening for fall risk and other medical concerns.    This visit is provided free of charge (no copay) for all Medicare recipients. The clinical pharmacists at [facility] have begun to conduct these Wellness Visits which will also include a thorough review of all your medications.    As you primary medical provider recommend that you make an appointment for your Annual Wellness Visit if you have not done so already this year.  You may set up this appointment before you leave today or you may call back ([phone]) and schedule an appointment.  Please make sure when you call that you mention that you are scheduling your Annual Wellness Visit with the clinical pharmacist so that the appointment may be made for the proper length of time.     Continue current medications. Continue good therapeutic lifestyle changes which include good diet and exercise. Fall precautions discussed with patient. If an FOBT was given today- please return it to our front desk. If you are over 45 years old - you may need Prevnar 23 or the adult Pneumonia vaccine.  Flu Shots are still available at our office. If you still haven't had one please call to set up a nurse visit to get one.   After your visit with [place] today you will receive a survey in the mail or online from [employer] regarding your care with [place]. Please take a moment to  fill this out. Your feedback is very important to [place] as you can help [place] better understand your patient needs as well as improve your experience and satisfaction. WE CARE ABOUT YOU!!!   The patient should continue to protect his airways by drinking plenty of fluids using a cool mist humidifier and taking his Mucinex He should discuss with his hematologist the risk benefit ratio of taking a statin drug with a history of thrombocytopenia and the positive history in his family of heart disease. He should follow up with his appointment with the urologist next week for checking his prostate and PSA. He should continue to follow-up regularly with his hematologist.

## 2014-06-17 NOTE — Progress Notes (Signed)
Subjective:    Patient ID: Connor [name], male    DOB: 11-30-1945, 69 y.o.   MRN: [id number]  HPI Pt here for follow up and management of chronic medical problems which includes hyperlipidemia, anemia, and hypertension. He is taking medications regularly. This patient is doing better but still has lingering congestion and a cough similar to his wife. He has a history of thrombocytopenia and polycythemia vera. He is followed by the hematologist regularly. He also has an iron deficiency anemia. There is a family history of heart disease. He is intolerant of statin drugs.  He denies chest pain shortness of breath or GI symptoms.       Patient Active Problem List   Diagnosis Date Noted  . Hyperlipidemia LDL goal <100 04/12/2014  . Polycythemia vera 04/19/2013  . Family history of early CAD 02/19/2013  . Iron deficiency anemia 09/01/2012  . H/O vitamin D deficiency 05/21/2012  . Diverticulosis of colon 05/21/2012  . Carotid artery stenosis, asymptomatic 05/21/2012  . BPH (benign prostatic hyperplasia)   . Cardiomegaly   . Thrombocytopenia   . Lap Nissen Fundoplication August 7829 11/26/2011  . Weight gain, abnormal 08/27/2009  . ESSENTIAL HYPERTENSION, BENIGN 08/27/2009  . OSA (obstructive sleep apnea) 02/19/2008  . COPD, mild 05/26/2007   Outpatient Encounter Prescriptions as of 06/17/2014  Medication Sig  . alfuzosin (UROXATRAL) 10 MG 24 hr tablet Take 10 mg by mouth every morning.   [name] aspirin 81 MG tablet Take 81 mg by mouth 2 (two) times daily.   . Cholecalciferol (VITAMIN D-3) 5000 UNITS TABS Take 1 tablet by mouth daily.   . Coenzyme Q10 (CO Q 10 PO) Take 1 capsule by mouth daily.   . fish oil-omega-3 fatty acids 1000 MG capsule Take 2 g by mouth daily.   . Glucosamine-Chondroit-Vit C-Mn (GLUCOSAMINE CHONDR 1500 COMPLX) CAPS Take by mouth every morning.  . Ipratropium-Albuterol (COMBIVENT) 20-100 MCG/ACT AERS respimat Inhale 1 puff into the lungs as needed for wheezing or  shortness of breath.  . losartan (COZAAR) 25 MG tablet Take 25 mg by mouth daily.  . Multiple Vitamins-Minerals (CENTRUM SILVER ULTRA MENS) TABS Take 1 tablet by mouth every morning. Once a day  . polyethylene glycol (MIRALAX / GLYCOLAX) packet Take 17 g by mouth as needed.   . Probiotic Product (PHILLIPS COLON HEALTH PO) Take 1 capsule by mouth daily.   [name] pyridOXINE (VITAMIN B-6) 100 MG tablet Take 100 mg by mouth daily.   . Turmeric 500 MG CAPS Take by mouth every morning.  [name] ZETIA 10 MG tablet TAKE 1 TABLET (10 MG TOTAL) BY MOUTH DAILY.  . chlorpheniramine-HYDROcodone (TUSSIONEX PENNKINETIC ER) 10-8 MG/5ML LQCR 1 teaspoon at hs as needed for severe cough (Patient not taking: Reported on 06/17/2014)  . [DISCONTINUED] amoxicillin (AMOXIL) 500 MG capsule Take 1 capsule (500 mg total) by mouth 3 (three) times daily.  . [DISCONTINUED] levofloxacin (LEVAQUIN) 500 MG tablet Take 1 tablet (500 mg total) by mouth daily.  . [DISCONTINUED] predniSONE (DELTASONE) 10 MG tablet 4 tablets for 2 days, 3 tabs for 2 days, 2 tabs for 2 days, 1 tab for 2 days, 1/2 tab for 2 days    Review of Systems  Constitutional: Negative.   HENT: Positive for congestion (lingering cough and congetion).   Eyes: Negative.   Respiratory: Positive for cough.   Cardiovascular: Negative.   Gastrointestinal: Negative.   Endocrine: Negative.   Genitourinary: Negative.   Musculoskeletal: Negative.   Skin: Negative.   Allergic/Immunologic:  Negative.   Neurological: Negative.   Hematological: Negative.   Psychiatric/Behavioral: Negative.        Objective:   Physical Exam  Constitutional: He is oriented to person, place, and time. He appears well-developed and well-nourished. No distress.  HENT:  Head: Normocephalic and atraumatic.  Right Ear: External ear normal.  Left Ear: External ear normal.  Nose: Nose normal.  Mouth/Throat: Oropharynx is clear and moist. No oropharyngeal exudate.  Slight nasal congestion  bilaterally  Eyes: Conjunctivae and EOM are normal. Pupils are equal, round, and reactive to light. Right eye exhibits no discharge. Left eye exhibits no discharge. No scleral icterus.  Neck: Normal range of motion. Neck supple. No tracheal deviation present. No thyromegaly present.  No carotid bruits  Cardiovascular: Normal rate, regular rhythm, normal heart sounds and intact distal pulses.  Exam reveals no gallop and no friction rub.   No murmur heard. The heart has a regular rate and rhythm at 84/m  Pulmonary/Chest: Effort normal and breath sounds normal. No respiratory distress. He has no wheezes. He has no rales. He exhibits no tenderness.  The lungs were clear today anteriorly and posteriorly with a dry cough but no congestion.  Abdominal: Soft. Bowel sounds are normal. He exhibits no mass. There is no tenderness. There is no rebound and no guarding.  Musculoskeletal: Normal range of motion. He exhibits no edema.  Lymphadenopathy:    He has no cervical adenopathy.  Neurological: He is alert and oriented to person, place, and time. He has normal reflexes.  Skin: Skin is warm and dry. No rash noted.  Psychiatric: He has a normal mood and affect. His behavior is normal. Judgment and thought content normal.  Nursing note and vitals reviewed.  BP 132/81 mmHg  Pulse 97  Temp(Src) 97.3 F (36.3 C) (Oral)  Ht 5\' 10"  (1.778 m)  Wt 238 lb (107.956 kg)  BMI 34.15 kg/m2        Assessment & Plan:  1. Thrombocytopenia -Continue follow-up with hematology -Platelet numbers are good today. - Ipratropium-Albuterol (COMBIVENT) 20-100 MCG/ACT AERS respimat; Inhale 1 puff into the lungs as needed for wheezing or shortness of breath.  Dispense: 1 Inhaler; Refill: 11  2. Essential hypertension, benign -Blood pressure is good today the patient should continue with his current angiotensin receptor blocker.  3. Cardiomegaly -The patient is having no increased symptoms with this   4. Iron  deficiency anemia -Follow up with hematology, the hemoglobin is good today.  5. BPH (benign prostatic hyperplasia) -The patient is having no symptoms with his voiding and he is to see the urologist soon for his routine follow-up.  6. H/O vitamin D deficiency -Continue with current treatment  7. Hyperlipidemia -The patient is statin intolerant mostly because of concerns with decreased platelet count.  8. Anemia, unspecified anemia type -CBC had a normal hemoglobin and white blood cell count and platelet count today. - Ipratropium-Albuterol (COMBIVENT) 20-100 MCG/ACT AERS respimat; Inhale 1 puff into the lungs as needed for wheezing or shortness of breath.  Dispense: 1 Inhaler; Refill: 11  9. COPD -This is stable.  Patient Instructions                       Medicare Annual Wellness Visit  [facility] and the medical providers at [facility] strive to bring you the best medical care.  In doing so we not only want to address your current medical conditions and concerns but also to detect new conditions  early and prevent illness, disease and health-related problems.    Medicare offers a yearly Wellness Visit which allows our clinical staff to assess your need for preventative services including immunizations, lifestyle education, counseling to decrease risk of preventable diseases and screening for fall risk and other medical concerns.    This visit is provided free of charge (no copay) for all Medicare recipients. The clinical pharmacists at [facility] have begun to conduct these Wellness Visits which will also include a thorough review of all your medications.    As you primary medical provider recommend that you make an appointment for your Annual Wellness Visit if you have not done so already this year.  You may set up this appointment before you leave today or you may call back ([phone]) and schedule an appointment.  Please make sure when  you call that you mention that you are scheduling your Annual Wellness Visit with the clinical pharmacist so that the appointment may be made for the proper length of time.     Continue current medications. Continue good therapeutic lifestyle changes which include good diet and exercise. Fall precautions discussed with patient. If an FOBT was given today- please return it to our front desk. If you are over 29 years old - you may need Prevnar 74 or the adult Pneumonia vaccine.  Flu Shots are still available at our office. If you still haven't had one please call to set up a nurse visit to get one.   After your visit with [place] today you will receive a survey in the mail or online from [employer] regarding your care with [place]. Please take a moment to fill this out. Your feedback is very important to [place] as you can help [place] better understand your patient needs as well as improve your experience and satisfaction. WE CARE ABOUT YOU!!!   The patient should continue to protect his airways by drinking plenty of fluids using a cool mist humidifier and taking his Mucinex He should discuss with his hematologist the risk benefit ratio of taking a statin drug with a history of thrombocytopenia and the positive history in his family of heart disease. He should follow up with his appointment with the urologist next week for checking his prostate and PSA. He should continue to follow-up regularly with his hematologist.   Arrie Senate MD

## 2014-06-19 ENCOUNTER — Other Ambulatory Visit (HOSPITAL_BASED_OUTPATIENT_CLINIC_OR_DEPARTMENT_OTHER): Payer: Medicare Other | Admitting: Lab

## 2014-06-19 ENCOUNTER — Ambulatory Visit (HOSPITAL_BASED_OUTPATIENT_CLINIC_OR_DEPARTMENT_OTHER): Payer: Medicare Other | Admitting: Hematology & Oncology

## 2014-06-19 ENCOUNTER — Encounter: Payer: Self-pay | Admitting: Hematology & Oncology

## 2014-06-19 VITALS — BP 115/68 | HR 80 | Temp 97.7°F | Resp 18 | Ht 70.0 in | Wt 238.0 lb

## 2014-06-19 DIAGNOSIS — D45 Polycythemia vera: Secondary | ICD-10-CM

## 2014-06-19 DIAGNOSIS — D696 Thrombocytopenia, unspecified: Secondary | ICD-10-CM

## 2014-06-19 DIAGNOSIS — R5383 Other fatigue: Secondary | ICD-10-CM | POA: Diagnosis not present

## 2014-06-19 DIAGNOSIS — Z7982 Long term (current) use of aspirin: Secondary | ICD-10-CM

## 2014-06-19 DIAGNOSIS — D509 Iron deficiency anemia, unspecified: Secondary | ICD-10-CM

## 2014-06-19 LAB — CBC WITH DIFFERENTIAL (CANCER CENTER ONLY)
BASO#: 0 10*3/uL (ref 0.0–0.2)
BASO%: 0.4 % (ref 0.0–2.0)
EOS ABS: 0.2 10*3/uL (ref 0.0–0.5)
EOS%: 4.8 % (ref 0.0–7.0)
HCT: 42.3 % (ref 38.7–49.9)
HGB: 13.1 g/dL (ref 13.0–17.1)
LYMPH#: 1 10*3/uL (ref 0.9–3.3)
LYMPH%: 20.6 % (ref 14.0–48.0)
MCH: 25.6 pg — ABNORMAL LOW (ref 28.0–33.4)
MCHC: 31 g/dL — AB (ref 32.0–35.9)
MCV: 83 fL (ref 82–98)
MONO#: 0.5 10*3/uL (ref 0.1–0.9)
MONO%: 10.4 % (ref 0.0–13.0)
NEUT%: 63.8 % (ref 40.0–80.0)
NEUTROS ABS: 2.9 10*3/uL (ref 1.5–6.5)
PLATELETS: 144 10*3/uL — AB (ref 145–400)
RBC: 5.11 10*6/uL (ref 4.20–5.70)
RDW: 15.3 % (ref 11.1–15.7)
WBC: 4.6 10*3/uL (ref 4.0–10.0)

## 2014-06-19 LAB — CMP (CANCER CENTER ONLY)
ALT: 20 U/L (ref 10–47)
AST: 34 U/L (ref 11–38)
Albumin: 3.7 g/dL (ref 3.3–5.5)
Alkaline Phosphatase: 61 U/L (ref 26–84)
BILIRUBIN TOTAL: 0.9 mg/dL (ref 0.20–1.60)
BUN, Bld: 22 mg/dL (ref 7–22)
CALCIUM: 9.5 mg/dL (ref 8.0–10.3)
CHLORIDE: 104 meq/L (ref 98–108)
CO2: 29 meq/L (ref 18–33)
Creat: 1.1 mg/dl (ref 0.6–1.2)
Glucose, Bld: 94 mg/dL (ref 73–118)
Potassium: 4.5 mEq/L (ref 3.3–4.7)
Sodium: 144 mEq/L (ref 128–145)
Total Protein: 6.6 g/dL (ref 6.4–8.1)

## 2014-06-19 LAB — IRON AND TIBC CHCC
%SAT: 16 % — ABNORMAL LOW (ref 20–55)
Iron: 60 ug/dL (ref 42–163)
TIBC: 383 ug/dL (ref 202–409)
UIBC: 323 ug/dL (ref 117–376)

## 2014-06-19 LAB — FERRITIN CHCC: Ferritin: 9 ng/ml — ABNORMAL LOW (ref 22–316)

## 2014-06-19 NOTE — Progress Notes (Signed)
Hematology and Oncology Follow Up Visit  WRAY GOEHRING [id number] [birth year] 69 y.o. 06/19/2014   Principle Diagnosis:   Polycythemia vera-Triple negative  Chronic immune thrombocytopenia vs medication  Current Therapy:    Phlebotomy to maintain hematocrit below 45%  Aspirin 81 mg by mouth daily     Interim History:  Mr.  Regnier is back for followup. He feels pretty well. He wants to lose some weight.. The problem right now is that he has cholesterol issues. He was seen by family doctor. His cholesterol is only 146. Apparently, he had issues with small or large particles. He wants to get Mr. Bellanca back onto a statin drug. I told him that this be okay from my point of view. If the statin drug causes thrombocytopenia then we would know it.  Otherwise, he does have some fatigue. He does not have a lot of stamina.  We had checked him thoroughly for the polycythemia. He is "triple negative" with all of his mutational studies.  He's had no problems with nausea vomiting.  He received had a tracheobronchitis. He is on some steroids for this. He thinks that the steroids may caused him to gain weight.       Medications:  Current outpatient prescriptions:  .  alfuzosin (UROXATRAL) 10 MG 24 hr tablet, Take 10 mg by mouth every morning. , Disp: , Rfl:  .  aspirin 81 MG tablet, Take 81 mg by mouth 2 (two) times daily. , Disp: , Rfl:  .  Cholecalciferol (VITAMIN D-3) 5000 UNITS TABS, Take 1 tablet by mouth daily. , Disp: , Rfl:  .  Coenzyme Q10 (CO Q 10 PO), Take 1 capsule by mouth daily. , Disp: , Rfl:  .  fish oil-omega-3 fatty acids 1000 MG capsule, Take 2 g by mouth daily. , Disp: , Rfl:  .  Glucosamine-Chondroit-Vit C-Mn (GLUCOSAMINE CHONDR 1500 COMPLX) CAPS, Take by mouth every morning., Disp: , Rfl:  .  Ipratropium-Albuterol (COMBIVENT) 20-100 MCG/ACT AERS respimat, Inhale 1 puff into the lungs as needed for wheezing or shortness of breath., Disp: 1 Inhaler, Rfl: 11 .  losartan  (COZAAR) 25 MG tablet, Take 25 mg by mouth daily., Disp: , Rfl:  .  Multiple Vitamins-Minerals (CENTRUM SILVER ULTRA MENS) TABS, Take 1 tablet by mouth every morning. Once a day, Disp: , Rfl:  .  polyethylene glycol (MIRALAX / GLYCOLAX) packet, Take 17 g by mouth as needed. , Disp: , Rfl:  .  Probiotic Product (PHILLIPS COLON HEALTH PO), Take 1 capsule by mouth daily. , Disp: , Rfl:  .  pyridOXINE (VITAMIN B-6) 100 MG tablet, Take 100 mg by mouth daily. , Disp: , Rfl:  .  Turmeric 500 MG CAPS, Take by mouth every morning., Disp: , Rfl:  .  ZETIA 10 MG tablet, TAKE 1 TABLET (10 MG TOTAL) BY MOUTH DAILY., Disp: 30 tablet, Rfl: 5 .  chlorpheniramine-HYDROcodone (TUSSIONEX PENNKINETIC ER) 10-8 MG/5ML LQCR, 1 teaspoon at hs as needed for severe cough (Patient not taking: Reported on 06/17/2014), Disp: 100 mL, Rfl: 0  Allergies:  Allergies  Allergen Reactions  . Dexlansoprazole Diarrhea and Nausea Only  . Dexlansoprazole Diarrhea and Nausea Only    unknown    Past Medical History, Surgical history, Social history, and Family History were reviewed and updated.  Review of Systems: As above  Physical Exam:  height is 5\' 10"  (1.778 m) and weight is 238 lb (107.956 kg). His oral temperature is 97.7 F (36.5 C). His blood pressure is 115/68  and his pulse is 80. His respiration is 18.   Well-developed and well-nourished white gentleman. Head and neck exam shows no ocular or oral lesions.  He has no palpable cervical or supraclavicular lymph nodes. Lungs are clear. Cardiac exam regular rate and rhythm with no murmurs rubs or bruits. Abdomen is soft.  He has good bowel sounds. There is no fluid wave. There is no palpable liver or spleen tip. Extremities shows no clubbing cyanosis or edema. Strength is good bilaterally. Skin exam shows no rashes ecchymoses or petechia. Neurological exam is nonfocal.  Lab Results  Component Value Date   WBC 4.6 06/19/2014   HGB 13.1 06/19/2014   HCT 42.3 06/19/2014    MCV 83 06/19/2014   PLT 144* 06/19/2014     Chemistry      Component Value Date/Time   NA 144 06/19/2014 1047   NA 143 06/10/2014 1243   NA 140 04/24/2014 1204   K 4.5 06/19/2014 1047   K 4.5 06/10/2014 1243   CL 104 06/19/2014 1047   CL 105 06/10/2014 1243   CO2 29 06/19/2014 1047   CO2 24 06/10/2014 1243   BUN 22 06/19/2014 1047   BUN 16 06/10/2014 1243   BUN 16 04/24/2014 1204   CREATININE 1.1 06/19/2014 1047   CREATININE 1.00 06/10/2014 1243      Component Value Date/Time   CALCIUM 9.5 06/19/2014 1047   CALCIUM 9.4 06/10/2014 1243   ALKPHOS 61 06/19/2014 1047   ALKPHOS 67 06/10/2014 1243   AST 34 06/19/2014 1047   AST 19 06/10/2014 1243   ALT 20 06/19/2014 1047   ALT 14 06/10/2014 1243   BILITOT 0.90 06/19/2014 1047   BILITOT 0.6 06/10/2014 1243   BILITOT 0.4 04/24/2014 1204         Impression and Plan: Mr. Olund is a 69 year old gentleman with polycythemia. His blood count looks great today. His hematocrit is still below 45% so we do not have to phlebotomize him.  I will give him back in about 6 weeks. By then, hopefully he will be back on to the statin drug. We can see what his platelet count looks like. If his platelet count is going down,, then I would recommend that he go on to one of the new injectable anticholesterol medications which are incredibly effective but quite expensive. Volanda Napoleon, MD 3/9/201611:29 AM

## 2014-06-28 DIAGNOSIS — N401 Enlarged prostate with lower urinary tract symptoms: Secondary | ICD-10-CM | POA: Diagnosis not present

## 2014-06-28 DIAGNOSIS — N5201 Erectile dysfunction due to arterial insufficiency: Secondary | ICD-10-CM | POA: Diagnosis not present

## 2014-06-28 DIAGNOSIS — N138 Other obstructive and reflux uropathy: Secondary | ICD-10-CM | POA: Diagnosis not present

## 2014-07-04 ENCOUNTER — Telehealth: Payer: Self-pay | Admitting: Family Medicine

## 2014-07-08 MED ORDER — ROSUVASTATIN CALCIUM 10 MG PO TABS: 10.0000 mg | ORAL_TABLET | Freq: Every day | ORAL | Status: DC

## 2014-07-08 NOTE — Telephone Encounter (Signed)
Retry Crestor 10 and get a renal profile and liver function tests in 4 weeks

## 2014-07-08 NOTE — Telephone Encounter (Signed)
Cholesterol was up  Seen hematologist  - he talked to them about re-starting cholesterol med. Dr Jorje Guild - he is ok with the restart as long as we follow up on the kidney and liver.  Crestor will need to be reordered --- or start [place]. - both OK options with Dr Jorje Guild  Call the home # with answer.

## 2014-07-08 NOTE — Telephone Encounter (Signed)
Crestor sent to [facility]  Pt also stated that he has been helping his wife & has been having back spasms again, can something be called in for this

## 2014-07-08 NOTE — Telephone Encounter (Signed)
Please call in Crestor as noted

## 2014-07-09 NOTE — Telephone Encounter (Signed)
Per [facility] - try warm wet compressions and advil or tylenol   Pt aware by detailed VM

## 2014-07-09 NOTE — Telephone Encounter (Signed)
I am not sure why I keep getting this message back. We have suggested that he go back on the Crestor and I thought that had been called in

## 2014-07-31 ENCOUNTER — Ambulatory Visit (HOSPITAL_BASED_OUTPATIENT_CLINIC_OR_DEPARTMENT_OTHER): Payer: Medicare Other | Admitting: Family

## 2014-07-31 ENCOUNTER — Ambulatory Visit: Payer: Medicare Other

## 2014-07-31 ENCOUNTER — Encounter: Payer: Self-pay | Admitting: Family

## 2014-07-31 ENCOUNTER — Other Ambulatory Visit (HOSPITAL_BASED_OUTPATIENT_CLINIC_OR_DEPARTMENT_OTHER): Payer: Medicare Other

## 2014-07-31 VITALS — BP 151/71 | HR 84 | Temp 97.6°F | Resp 20 | Ht 70.0 in | Wt 237.0 lb

## 2014-07-31 DIAGNOSIS — D45 Polycythemia vera: Secondary | ICD-10-CM

## 2014-07-31 DIAGNOSIS — D751 Secondary polycythemia: Secondary | ICD-10-CM | POA: Diagnosis not present

## 2014-07-31 DIAGNOSIS — D696 Thrombocytopenia, unspecified: Secondary | ICD-10-CM

## 2014-07-31 DIAGNOSIS — D509 Iron deficiency anemia, unspecified: Secondary | ICD-10-CM

## 2014-07-31 LAB — CBC WITH DIFFERENTIAL (CANCER CENTER ONLY)
BASO#: 0 10*3/uL (ref 0.0–0.2)
BASO%: 0.9 % (ref 0.0–2.0)
EOS%: 3.4 % (ref 0.0–7.0)
Eosinophils Absolute: 0.2 10*3/uL (ref 0.0–0.5)
HCT: 41.8 % (ref 38.7–49.9)
HGB: 12.9 g/dL — ABNORMAL LOW (ref 13.0–17.1)
LYMPH#: 0.9 10*3/uL (ref 0.9–3.3)
LYMPH%: 19.3 % (ref 14.0–48.0)
MCH: 25.1 pg — AB (ref 28.0–33.4)
MCHC: 30.9 g/dL — AB (ref 32.0–35.9)
MCV: 81 fL — AB (ref 82–98)
MONO#: 0.2 10*3/uL (ref 0.1–0.9)
MONO%: 4.9 % (ref 0.0–13.0)
NEUT#: 3.2 10*3/uL (ref 1.5–6.5)
NEUT%: 71.5 % (ref 40.0–80.0)
Platelets: 133 10*3/uL — ABNORMAL LOW (ref 145–400)
RBC: 5.14 10*6/uL (ref 4.20–5.70)
RDW: 15.6 % (ref 11.1–15.7)
WBC: 4.5 10*3/uL (ref 4.0–10.0)

## 2014-07-31 LAB — COMPREHENSIVE METABOLIC PANEL
ALT: 18 U/L (ref 0–53)
AST: 23 U/L (ref 0–37)
Albumin: 4 g/dL (ref 3.5–5.2)
Alkaline Phosphatase: 52 U/L (ref 39–117)
BILIRUBIN TOTAL: 0.6 mg/dL (ref 0.2–1.2)
BUN: 19 mg/dL (ref 6–23)
CO2: 24 mEq/L (ref 19–32)
CREATININE: 1.01 mg/dL (ref 0.50–1.35)
Calcium: 8.9 mg/dL (ref 8.4–10.5)
Chloride: 108 mEq/L (ref 96–112)
Glucose, Bld: 162 mg/dL — ABNORMAL HIGH (ref 70–99)
Potassium: 4.1 mEq/L (ref 3.5–5.3)
SODIUM: 141 meq/L (ref 135–145)
Total Protein: 6.3 g/dL (ref 6.0–8.3)

## 2014-07-31 LAB — CHCC SATELLITE - SMEAR

## 2014-07-31 NOTE — Progress Notes (Signed)
No phlebotomy needed today per Dr Ennever.  

## 2014-07-31 NOTE — Progress Notes (Signed)
Hematology and Oncology Follow Up Visit  Connor [name] [id number] 09-Mar-1946 69 y.o. 07/31/2014   Principle Diagnosis:  Polycythemia vera-Triple negative Chronic immune thrombocytopenia vs medication  Current Therapy:   Phlebotomy to maintain hematocrit below 45% Aspirin 81 mg by mouth daily    Interim History:  Connor [name] is here today with his wife for a follow-up. He is doing well. He still has some fatigue at times. He wants to be able to get out and do some yard work. His last phlebotomy was in November of last year. His Hct today is 41.8.  He is taking Crestor for his cholesterol. His platelet count is 133 today and he has had no symptoms of thrombocytopenia.  He denies fever, chills, n/v, cough, rash, dizziness, SOB, chest pain, palpitations, abdominal pain, constipation, diarrhea, blood in urine or stool. No bruising or episodes of bleeding.  No swelling, tenderness, numbness or tingling in his extremities. No new aches or pains.  His appetite is good and he is making sure to stay hydrated. His weight is stable. He is still trying to lose some weight.   Medications:    Medication List       This list is accurate as of: 07/31/14 12:22 PM.  Always use your most recent med list.               alfuzosin 10 MG 24 hr tablet  Commonly known as:  UROXATRAL  Take 10 mg by mouth every morning.     aspirin 81 MG tablet  Take 81 mg by mouth 2 (two) times daily.     CENTRUM SILVER ULTRA MENS Tabs  Take 1 tablet by mouth every morning. Once a day     chlorpheniramine-HYDROcodone 10-8 MG/5ML Lqcr  Commonly known as:  TUSSIONEX PENNKINETIC ER  1 teaspoon at hs as needed for severe cough     CO Q 10 PO  Take 1 capsule by mouth daily.     COZAAR 25 MG tablet  Generic drug:  losartan  Take 25 mg by mouth daily.     fish oil-omega-3 fatty acids 1000 MG capsule  Take 2 g by mouth daily.     GLUCOSAMINE CHONDR 1500 COMPLX Caps  Take by mouth every morning.     Ipratropium-Albuterol 20-100 MCG/ACT Aers respimat  Commonly known as:  COMBIVENT  Inhale 1 puff into the lungs as needed for wheezing or shortness of breath.     PHILLIPS COLON HEALTH PO  Take 1 capsule by mouth daily.     polyethylene glycol packet  Commonly known as:  MIRALAX / GLYCOLAX  Take 17 g by mouth as needed.     pyridOXINE 100 MG tablet  Commonly known as:  VITAMIN B-6  Take 100 mg by mouth daily.     rosuvastatin 10 MG tablet  Commonly known as:  CRESTOR  Take 1 tablet (10 mg total) by mouth daily.     Turmeric 500 MG Caps  Take by mouth every morning.     Vitamin D-3 5000 UNITS Tabs  Take 1 tablet by mouth daily.     ZETIA 10 MG tablet  Generic drug:  ezetimibe  TAKE 1 TABLET (10 MG TOTAL) BY MOUTH DAILY.        Allergies:  Allergies  Allergen Reactions  . Dexlansoprazole Diarrhea and Nausea Only  . Dexlansoprazole Diarrhea and Nausea Only    unknown    Past Medical History, Surgical history, Social history, and Family History were reviewed  and updated.  Review of Systems: All other 10 point review of systems is negative.   Physical Exam:  vitals were not taken for this visit.  Wt Readings from Last 3 Encounters:  06/19/14 238 lb (107.956 kg)  06/17/14 238 lb (107.956 kg)  05/13/14 240 lb (108.863 kg)    Ocular: Sclerae unicteric, pupils equal, round and reactive to light Ear-nose-throat: Oropharynx clear, dentition fair Lymphatic: No cervical or supraclavicular adenopathy Lungs no rales or rhonchi, good excursion bilaterally Heart regular rate and rhythm, no murmur appreciated Abd soft, nontender, positive bowel sounds MSK no focal spinal tenderness, no joint edema Neuro: non-focal, well-oriented, appropriate affect  Lab Results  Component Value Date   WBC 4.5 07/31/2014   HGB 12.9* 07/31/2014   HCT 41.8 07/31/2014   MCV 81* 07/31/2014   PLT 133 Platelet count consistent in citrate* 07/31/2014   Lab Results  Component Value Date     FERRITIN 9* 06/19/2014   IRON 60 06/19/2014   TIBC 383 06/19/2014   UIBC 323 06/19/2014   IRONPCTSAT 16* 06/19/2014   Lab Results  Component Value Date   RETICCTPCT 0.8 09/06/2013   RBC 5.14 07/31/2014   RETICCTABS 40.2 09/06/2013   No results found for: KPAFRELGTCHN, LAMBDASER, KAPLAMBRATIO No results found for: Kandis Cocking, IGMSERUM No results found for: Odetta Pink, SPEI   Chemistry      Component Value Date/Time   NA 144 06/19/2014 1047   NA 143 06/10/2014 1243   NA 140 04/24/2014 1204   K 4.5 06/19/2014 1047   K 4.5 06/10/2014 1243   CL 104 06/19/2014 1047   CL 105 06/10/2014 1243   CO2 29 06/19/2014 1047   CO2 24 06/10/2014 1243   BUN 22 06/19/2014 1047   BUN 16 06/10/2014 1243   BUN 16 04/24/2014 1204   CREATININE 1.1 06/19/2014 1047   CREATININE 1.00 06/10/2014 1243      Component Value Date/Time   CALCIUM 9.5 06/19/2014 1047   CALCIUM 9.4 06/10/2014 1243   ALKPHOS 61 06/19/2014 1047   ALKPHOS 67 06/10/2014 1243   AST 34 06/19/2014 1047   AST 19 06/10/2014 1243   ALT 20 06/19/2014 1047   ALT 14 06/10/2014 1243   BILITOT 0.90 06/19/2014 1047   BILITOT 0.6 06/10/2014 1243   BILITOT 0.4 04/24/2014 1204     Impression and Plan: Connor [name] is a 69 year old gentleman with polycythemia. He is a little fatigued at times but doing well.  His Hct today is 41.8 so he will not need phlebotomized today.  His platelet count is 133 and he has had no issues with thrombocytopenia. He is fine to stay on Crestor.  We will see him back in 2 months for labs and follow-up.  He knows to call here with any questions or concerns. We can certainly see him sooner if need be.   Eliezer Bottom, NP 4/20/201612:22 PM

## 2014-08-01 LAB — IRON AND TIBC CHCC
%SAT: 10 % — ABNORMAL LOW (ref 20–55)
Iron: 40 ug/dL — ABNORMAL LOW (ref 42–163)
TIBC: 383 ug/dL (ref 202–409)
UIBC: 343 ug/dL (ref 117–376)

## 2014-08-01 LAB — FERRITIN CHCC: Ferritin: 7 ng/ml — ABNORMAL LOW (ref 22–316)

## 2014-09-02 DIAGNOSIS — Z85828 Personal history of other malignant neoplasm of skin: Secondary | ICD-10-CM | POA: Diagnosis not present

## 2014-09-02 DIAGNOSIS — L570 Actinic keratosis: Secondary | ICD-10-CM | POA: Diagnosis not present

## 2014-09-02 DIAGNOSIS — D485 Neoplasm of uncertain behavior of skin: Secondary | ICD-10-CM | POA: Diagnosis not present

## 2014-09-02 DIAGNOSIS — L814 Other melanin hyperpigmentation: Secondary | ICD-10-CM | POA: Diagnosis not present

## 2014-09-02 DIAGNOSIS — C44712 Basal cell carcinoma of skin of right lower limb, including hip: Secondary | ICD-10-CM | POA: Diagnosis not present

## 2014-09-20 ENCOUNTER — Encounter: Payer: Self-pay | Admitting: *Deleted

## 2014-10-02 ENCOUNTER — Ambulatory Visit (HOSPITAL_BASED_OUTPATIENT_CLINIC_OR_DEPARTMENT_OTHER): Payer: Medicare Other | Admitting: Hematology & Oncology

## 2014-10-02 ENCOUNTER — Other Ambulatory Visit (HOSPITAL_BASED_OUTPATIENT_CLINIC_OR_DEPARTMENT_OTHER): Payer: Medicare Other

## 2014-10-02 ENCOUNTER — Encounter: Payer: Self-pay | Admitting: Hematology & Oncology

## 2014-10-02 VITALS — BP 112/61 | HR 83 | Temp 97.9°F | Resp 18 | Ht 69.0 in | Wt 234.0 lb

## 2014-10-02 DIAGNOSIS — D751 Secondary polycythemia: Secondary | ICD-10-CM

## 2014-10-02 DIAGNOSIS — J45909 Unspecified asthma, uncomplicated: Secondary | ICD-10-CM

## 2014-10-02 DIAGNOSIS — D45 Polycythemia vera: Secondary | ICD-10-CM

## 2014-10-02 DIAGNOSIS — L299 Pruritus, unspecified: Secondary | ICD-10-CM

## 2014-10-02 LAB — CBC WITH DIFFERENTIAL (CANCER CENTER ONLY)
BASO#: 0 10*3/uL (ref 0.0–0.2)
BASO%: 0.7 % (ref 0.0–2.0)
EOS ABS: 0.1 10*3/uL (ref 0.0–0.5)
EOS%: 2.9 % (ref 0.0–7.0)
HCT: 40.6 % (ref 38.7–49.9)
HGB: 12.8 g/dL — ABNORMAL LOW (ref 13.0–17.1)
LYMPH#: 0.9 10*3/uL (ref 0.9–3.3)
LYMPH%: 20.5 % (ref 14.0–48.0)
MCH: 25.1 pg — AB (ref 28.0–33.4)
MCHC: 31.5 g/dL — ABNORMAL LOW (ref 32.0–35.9)
MCV: 80 fL — AB (ref 82–98)
MONO#: 0.4 10*3/uL (ref 0.1–0.9)
MONO%: 8.9 % (ref 0.0–13.0)
NEUT#: 2.8 10*3/uL (ref 1.5–6.5)
NEUT%: 67 % (ref 40.0–80.0)
Platelets: 152 10*3/uL (ref 145–400)
RBC: 5.1 10*6/uL (ref 4.20–5.70)
RDW: 15.2 % (ref 11.1–15.7)
WBC: 4.2 10*3/uL (ref 4.0–10.0)

## 2014-10-02 LAB — FERRITIN CHCC: Ferritin: 6 ng/ml — ABNORMAL LOW (ref 22–316)

## 2014-10-02 LAB — IRON AND TIBC CHCC
%SAT: 13 % — AB (ref 20–55)
IRON: 52 ug/dL (ref 42–163)
TIBC: 388 ug/dL (ref 202–409)
UIBC: 336 ug/dL (ref 117–376)

## 2014-10-02 LAB — CHCC SATELLITE - SMEAR

## 2014-10-02 MED ORDER — MONTELUKAST SODIUM 10 MG PO TABS: 10.0000 mg | ORAL_TABLET | Freq: Every day | ORAL | Status: DC

## 2014-10-02 NOTE — Progress Notes (Signed)
Hematology and Oncology Follow Up Visit  Connor [name] [id number] 1946-01-03 69 y.o. 10/02/2014   Principle Diagnosis:   Polycythemia vera-Triple negative  Chronic immune thrombocytopenia vs medication  Current Therapy:    Phlebotomy to maintain hematocrit below 45%  Aspirin 81 mg by mouth daily     Interim History:  [name] is back for followup. He feels pretty well. He's having some more problems with itching. Again this is when he takes a shower. I'm not sure what else can be going on. I think that we can give Singulair a try. I think this would make sense. This is a mast cell disrupter.  Otherwise, he's had no other issues. His wife is still recovering from her exploratory laparotomy for a perforated diverticulum. She has a temporary colostomy. She will have this reversed in September.  He is trying to stay active. He has been playing some golf.  He's had no cough. He's had no wheezing. He's had no nausea or vomiting.   Overall, his performance status is ECOG 1.   Medications:  Current outpatient prescriptions:  .  alfuzosin (UROXATRAL) 10 MG 24 hr tablet, Take 10 mg by mouth every morning. , Disp: , Rfl:  .  aspirin 81 MG tablet, Take 81 mg by mouth 2 (two) times daily. , Disp: , Rfl:  .  Cholecalciferol (VITAMIN D-3) 5000 UNITS TABS, Take 1 tablet by mouth daily. , Disp: , Rfl:  .  Coenzyme Q10 (CO Q 10 PO), Take 1 capsule by mouth daily. , Disp: , Rfl:  .  fish oil-omega-3 fatty acids 1000 MG capsule, Take 2 g by mouth daily. , Disp: , Rfl:  .  Glucosamine-Chondroit-Vit C-Mn (GLUCOSAMINE CHONDR 1500 COMPLX) CAPS, Take by mouth every morning., Disp: , Rfl:  .  Ipratropium-Albuterol (COMBIVENT) 20-100 MCG/ACT AERS respimat, Inhale 1 puff into the lungs as needed for wheezing or shortness of breath., Disp: 1 Inhaler, Rfl: 11 .  losartan (COZAAR) 25 MG tablet, Take 25 mg by mouth daily., Disp: , Rfl:  .  Multiple Vitamins-Minerals (CENTRUM SILVER ULTRA MENS) TABS, Take  1 tablet by mouth every morning. Once a day, Disp: , Rfl:  .  polyethylene glycol (MIRALAX / GLYCOLAX) packet, Take 17 g by mouth as needed. , Disp: , Rfl:  .  Probiotic Product (PHILLIPS COLON HEALTH PO), Take 1 capsule by mouth daily. , Disp: , Rfl:  .  pyridOXINE (VITAMIN B-6) 100 MG tablet, Take 100 mg by mouth daily. , Disp: , Rfl:  .  rosuvastatin (CRESTOR) 10 MG tablet, Take 1 tablet (10 mg total) by mouth daily., Disp: 90 tablet, Rfl: 3 .  Turmeric 500 MG CAPS, Take by mouth every morning., Disp: , Rfl:  .  montelukast (SINGULAIR) 10 MG tablet, Take 1 tablet (10 mg total) by mouth at bedtime., Disp: 30 tablet, Rfl: 4  Allergies:  Allergies  Allergen Reactions  . Dexlansoprazole Diarrhea and Nausea Only  . Dexlansoprazole Diarrhea and Nausea Only    unknown    Past Medical History, Surgical history, Social history, and Family History were reviewed and updated.  Review of Systems: As above  Physical Exam:  height is 5\' 9"  (1.753 m) and weight is 234 lb (106.142 kg). His oral temperature is 97.9 F (36.6 C). His blood pressure is 112/61 and his pulse is 83. His respiration is 18.   Well-developed and well-nourished white gentleman. Head and neck exam shows no ocular or oral lesions.  He has no palpable cervical  or supraclavicular lymph nodes. Lungs are clear. Cardiac exam regular rate and rhythm with no murmurs rubs or bruits. Abdomen is soft.  He has good bowel sounds. There is no fluid wave. There is no palpable liver or spleen tip. Extremities shows no clubbing cyanosis or edema. Strength is good bilaterally. Skin exam shows no rashes ecchymoses or petechia. Neurological exam is nonfocal.  Lab Results  Component Value Date   WBC 4.2 10/02/2014   HGB 12.8* 10/02/2014   HCT 40.6 10/02/2014   MCV 80* 10/02/2014   PLT 152 Platelet count confirmed by slide estimate 10/02/2014     Chemistry      Component Value Date/Time   NA 141 07/31/2014 1207   NA 144 06/19/2014 1047   NA  143 06/10/2014 1243   K 4.1 07/31/2014 1207   K 4.5 06/19/2014 1047   CL 108 07/31/2014 1207   CL 104 06/19/2014 1047   CO2 24 07/31/2014 1207   CO2 29 06/19/2014 1047   BUN 19 07/31/2014 1207   BUN 22 06/19/2014 1047   BUN 16 06/10/2014 1243   CREATININE 1.01 07/31/2014 1207   CREATININE 1.1 06/19/2014 1047      Component Value Date/Time   CALCIUM 8.9 07/31/2014 1207   CALCIUM 9.5 06/19/2014 1047   ALKPHOS 52 07/31/2014 1207   ALKPHOS 61 06/19/2014 1047   AST 23 07/31/2014 1207   AST 34 06/19/2014 1047   ALT 18 07/31/2014 1207   ALT 20 06/19/2014 1047   BILITOT 0.6 07/31/2014 1207   BILITOT 0.90 06/19/2014 1047   BILITOT 0.6 06/10/2014 1243         Impression and Plan: Connor [name] is a 69 year old gentleman with polycythemia. His blood count looks great today. His hematocrit is still below 45% so we do not have to phlebotomize him.  I will try some Singulair with him. I want to see if this might help with some of the itching. I would also see a downside to having him take this. He'll take 10 mg a day before he takes a shower.  We will plan to get him back in another 2 months or so. [name]  Volanda Napoleon, MD 6/22/201611:24 AM

## 2014-10-03 LAB — COMPREHENSIVE METABOLIC PANEL
ALBUMIN: 4.2 g/dL (ref 3.5–5.2)
ALK PHOS: 54 U/L (ref 39–117)
ALT: 14 U/L (ref 0–53)
AST: 18 U/L (ref 0–37)
BUN: 29 mg/dL — ABNORMAL HIGH (ref 6–23)
CALCIUM: 9.7 mg/dL (ref 8.4–10.5)
CO2: 21 mEq/L (ref 19–32)
CREATININE: 1.08 mg/dL (ref 0.50–1.35)
Chloride: 108 mEq/L (ref 96–112)
Glucose, Bld: 99 mg/dL (ref 70–99)
POTASSIUM: 4.2 meq/L (ref 3.5–5.3)
Sodium: 143 mEq/L (ref 135–145)
Total Bilirubin: 0.6 mg/dL (ref 0.2–1.2)
Total Protein: 6.3 g/dL (ref 6.0–8.3)

## 2014-10-11 ENCOUNTER — Telehealth: Payer: Self-pay | Admitting: Family Medicine

## 2014-10-11 NOTE — Telephone Encounter (Signed)
Please call a prescription in for this patient for Flexeril 10 mg #30 one 3 times a day as needed

## 2014-10-12 MED ORDER — CYCLOBENZAPRINE HCL 10 MG PO TABS: 10.0000 mg | ORAL_TABLET | Freq: Three times a day (TID) | ORAL | Status: DC | PRN

## 2014-10-12 NOTE — Telephone Encounter (Signed)
Stp's wife and advised rx sent to the pharmacy. Pt voiced understanding.

## 2014-10-15 DIAGNOSIS — S39012A Strain of muscle, fascia and tendon of lower back, initial encounter: Secondary | ICD-10-CM | POA: Diagnosis not present

## 2014-10-15 DIAGNOSIS — M5137 Other intervertebral disc degeneration, lumbosacral region: Secondary | ICD-10-CM | POA: Diagnosis not present

## 2014-10-15 DIAGNOSIS — M5136 Other intervertebral disc degeneration, lumbar region: Secondary | ICD-10-CM | POA: Diagnosis not present

## 2014-10-24 ENCOUNTER — Encounter: Payer: Self-pay | Admitting: Gastroenterology

## 2014-11-26 DIAGNOSIS — M545 Low back pain: Secondary | ICD-10-CM | POA: Diagnosis not present

## 2014-11-26 DIAGNOSIS — M5136 Other intervertebral disc degeneration, lumbar region: Secondary | ICD-10-CM | POA: Diagnosis not present

## 2014-11-29 DIAGNOSIS — M545 Low back pain: Secondary | ICD-10-CM | POA: Diagnosis not present

## 2014-12-02 ENCOUNTER — Other Ambulatory Visit: Payer: Medicare Other

## 2014-12-02 ENCOUNTER — Ambulatory Visit: Payer: Medicare Other | Admitting: Hematology & Oncology

## 2014-12-17 ENCOUNTER — Ambulatory Visit: Payer: Medicare Other | Attending: Specialist | Admitting: Physical Therapy

## 2014-12-17 DIAGNOSIS — M5137 Other intervertebral disc degeneration, lumbosacral region: Secondary | ICD-10-CM | POA: Diagnosis not present

## 2014-12-17 DIAGNOSIS — M4806 Spinal stenosis, lumbar region: Secondary | ICD-10-CM | POA: Diagnosis not present

## 2014-12-17 DIAGNOSIS — M545 Low back pain: Secondary | ICD-10-CM | POA: Insufficient documentation

## 2014-12-18 ENCOUNTER — Encounter (INDEPENDENT_AMBULATORY_CARE_PROVIDER_SITE_OTHER): Payer: Self-pay

## 2014-12-18 ENCOUNTER — Ambulatory Visit (INDEPENDENT_AMBULATORY_CARE_PROVIDER_SITE_OTHER): Payer: Medicare Other | Admitting: Family Medicine

## 2014-12-18 ENCOUNTER — Encounter: Payer: Self-pay | Admitting: *Deleted

## 2014-12-18 ENCOUNTER — Encounter: Payer: Self-pay | Admitting: Family Medicine

## 2014-12-18 VITALS — BP 140/74 | HR 81 | Temp 96.9°F | Ht 69.0 in | Wt 234.0 lb

## 2014-12-18 DIAGNOSIS — G4733 Obstructive sleep apnea (adult) (pediatric): Secondary | ICD-10-CM | POA: Diagnosis not present

## 2014-12-18 DIAGNOSIS — I517 Cardiomegaly: Secondary | ICD-10-CM | POA: Diagnosis not present

## 2014-12-18 DIAGNOSIS — N400 Enlarged prostate without lower urinary tract symptoms: Secondary | ICD-10-CM | POA: Diagnosis not present

## 2014-12-18 DIAGNOSIS — D509 Iron deficiency anemia, unspecified: Secondary | ICD-10-CM | POA: Diagnosis not present

## 2014-12-18 DIAGNOSIS — E785 Hyperlipidemia, unspecified: Secondary | ICD-10-CM | POA: Diagnosis not present

## 2014-12-18 DIAGNOSIS — I10 Essential (primary) hypertension: Secondary | ICD-10-CM

## 2014-12-18 DIAGNOSIS — D696 Thrombocytopenia, unspecified: Secondary | ICD-10-CM

## 2014-12-18 DIAGNOSIS — I6523 Occlusion and stenosis of bilateral carotid arteries: Secondary | ICD-10-CM | POA: Diagnosis not present

## 2014-12-18 DIAGNOSIS — M4726 Other spondylosis with radiculopathy, lumbar region: Secondary | ICD-10-CM | POA: Diagnosis not present

## 2014-12-18 DIAGNOSIS — M25531 Pain in right wrist: Secondary | ICD-10-CM

## 2014-12-18 DIAGNOSIS — E559 Vitamin D deficiency, unspecified: Secondary | ICD-10-CM | POA: Diagnosis not present

## 2014-12-18 NOTE — Progress Notes (Signed)
Tammy,  Do you know if there is any problem with taking Tumeric and Celebrex together. He has bad arthritis in his right hand and wrist and someone at the ortho told him to try it, then someone else told him if may add "fuel to the fire"     Pt called and aware - they may come back in for BMP on 4-6 weeks

## 2014-12-18 NOTE — Progress Notes (Addendum)
Subjective:    Patient ID: Connor [name], male    DOB: 11/20/1945, 69 y.o.   MRN: [id number]  HPI Pt here for follow up and management of chronic medical problems which includes hypertension, hyperlipidemia, and anemia. He is taking medications regularly. The patient continues to have back pain and back problems he has had a recent MRI. He also has some arthralgias in his right hand. He brings in a copy of the MRI from [facility] for review. This showed disc bulging at L3 and 4 and L4 and 5 with mild bilateral foraminal stenosis. This was ordered by Dr. Tonita Cong. This report will be scanned into the record. The patient is also followed by his hematologist for his polycythemia and thrombocytopenia. He has not had any bleeding issues. The patient otherwise denies chest pain shortness of breath trouble swallowing heartburn indigestion nausea vomiting diarrhea or blood in the stool. He is also passing his water without problems but does see the urologist regularly.       Patient Active Problem List   Diagnosis Date Noted  . Hyperlipidemia LDL goal <100 04/12/2014  . Polycythemia vera 04/19/2013  . Family history of early CAD 02/19/2013  . Iron deficiency anemia 09/01/2012  . H/O vitamin D deficiency 05/21/2012  . Diverticulosis of colon 05/21/2012  . Carotid artery stenosis, asymptomatic 05/21/2012  . BPH (benign prostatic hyperplasia)   . Cardiomegaly   . Thrombocytopenia   . Lap Nissen Fundoplication August 1093 11/26/2011  . Weight gain, abnormal 08/27/2009  . ESSENTIAL HYPERTENSION, BENIGN 08/27/2009  . OSA (obstructive sleep apnea) 02/19/2008  . COPD, mild 05/26/2007   Outpatient Encounter Prescriptions as of 12/18/2014  Medication Sig  . alfuzosin (UROXATRAL) 10 MG 24 hr tablet Take 10 mg by mouth every morning.   [name] aspirin 81 MG tablet Take 81 mg by mouth 2 (two) times daily.   . Cholecalciferol (VITAMIN D-3) 5000 UNITS TABS Take 1 tablet by mouth daily.   . Coenzyme  Q10 (CO Q 10 PO) Take 1 capsule by mouth daily.   . cyclobenzaprine (FLEXERIL) 10 MG tablet Take 1 tablet (10 mg total) by mouth 3 (three) times daily as needed for muscle spasms.  . fish oil-omega-3 fatty acids 1000 MG capsule Take 2 g by mouth daily.   . Glucosamine-Chondroit-Vit C-Mn (GLUCOSAMINE CHONDR 1500 COMPLX) CAPS Take by mouth every morning.  . Ipratropium-Albuterol (COMBIVENT) 20-100 MCG/ACT AERS respimat Inhale 1 puff into the lungs as needed for wheezing or shortness of breath.  . losartan (COZAAR) 25 MG tablet Take 25 mg by mouth daily.  . montelukast (SINGULAIR) 10 MG tablet Take 1 tablet (10 mg total) by mouth at bedtime.  . Multiple Vitamins-Minerals (CENTRUM SILVER ULTRA MENS) TABS Take 1 tablet by mouth every morning. Once a day  . polyethylene glycol (MIRALAX / GLYCOLAX) packet Take 17 g by mouth as needed.   . Probiotic Product (PHILLIPS COLON HEALTH PO) Take 1 capsule by mouth daily.   [name] pyridOXINE (VITAMIN B-6) 100 MG tablet Take 100 mg by mouth daily.   . rosuvastatin (CRESTOR) 10 MG tablet Take 1 tablet (10 mg total) by mouth daily.  . Turmeric 500 MG CAPS Take by mouth every morning.   No facility-administered encounter medications on file as of 12/18/2014.      Review of Systems  Constitutional: Negative.   HENT: Negative.   Eyes: Negative.   Respiratory: Negative.   Cardiovascular: Negative.   Gastrointestinal: Negative.   Endocrine: Negative.   Genitourinary:  Negative.   Musculoskeletal: Positive for back pain (recent MRI) and arthralgias (right hand arthritis).  Skin: Negative.   Allergic/Immunologic: Negative.   Neurological: Negative.   Hematological: Negative.   Psychiatric/Behavioral: Negative.        Objective:   Physical Exam  Constitutional: He is oriented to person, place, and time. He appears well-developed and well-nourished. No distress.  HENT:  Head: Normocephalic and atraumatic.  Right Ear: External ear normal.  Left Ear: External  ear normal.  Nose: Nose normal.  Mouth/Throat: Oropharynx is clear and moist. No oropharyngeal exudate.  Eyes: Conjunctivae and EOM are normal. Pupils are equal, round, and reactive to light. Right eye exhibits no discharge. Left eye exhibits no discharge. No scleral icterus.  Neck: Normal range of motion. Neck supple. No thyromegaly present.  Bilateral carotid bruits persist left greater than right  Cardiovascular: Normal rate, regular rhythm, normal heart sounds and intact distal pulses.   No murmur heard. At 84/m  Pulmonary/Chest: Effort normal and breath sounds normal. No respiratory distress. He has no wheezes. He has no rales. He exhibits no tenderness.  Clear anteriorly and posteriorly without axillary adenopathy  Abdominal: Soft. Bowel sounds are normal. He exhibits no mass. There is no tenderness. There is no rebound and no guarding.  No abdominal tenderness or organ enlargement masses or bruits  Musculoskeletal: Normal range of motion. He exhibits no edema.  There was no obvious signs of pain in the back of on exam today and reflexes were good bilaterally. The patient reminds me this pain comes and goes and secondary to his activity level.  Lymphadenopathy:    He has no cervical adenopathy.  Neurological: He is alert and oriented to person, place, and time. He has normal reflexes. No cranial nerve deficit.  Skin: Skin is warm and dry. No rash noted.  Psychiatric: He has a normal mood and affect. His behavior is normal. Judgment and thought content normal.  Nursing note and vitals reviewed.  BP 140/74 mmHg  Pulse 81  Temp(Src) 96.9 F (36.1 C) (Oral)  Ht 5' 9"  (1.753 m)  Wt 234 lb (106.142 kg)  BMI 34.54 kg/m2        Assessment & Plan:  1. Essential hypertension, benign -Blood pressure is good today the patient will continue with current treatment - BMP8+EGFR - Hepatic function panel - CBC with Differential/Platelet  2. Thrombocytopenia -Continue follow-up with  Dr.Ennever, the hematologist - CBC with Differential/Platelet  3. Iron deficiency anemia -Continue follow-up with hematology - CBC with Differential/Platelet  4. Cardiomegaly -Continue follow-up with cardiology - CBC with Differential/Platelet  5. BPH (benign prostatic hyperplasia) -Continue follow-up with urology - CBC with Differential/Platelet  6. Hyperlipidemia -Continue current treatment pending results of lab work - NMR, lipoprofile - CBC with Differential/Platelet  7. Vitamin D deficiency -Continue current treatment pending results of lab work - Vit D  25 hydroxy (rtn osteoporosis monitoring) - CBC with Differential/Platelet  8. OSA (obstructive sleep apnea) -The patient is having no problems with this at the current time but he will follow up with pulmonology as needed - CBC with Differential/Platelet  9. Carotid artery stenosis, asymptomatic, bilateral -He gets routine carotid Dopplers and vascular surgery contacts him about this. - CBC with Differential/Platelet  10. Osteoarthritis of spine with radiculopathy, lumbar region -Continue follow-up with orthopedic surgeon and follow through with physical therapy as planned  11. Right wrist pain -Continue anti-inflammatory medicine but only take Celebrex or turmeric but not both  Meds ordered this encounter  Medications  .  celecoxib (CELEBREX) 200 MG capsule    Sig: Take 1 capsule by mouth daily.   Patient Instructions                       Medicare Annual Wellness Visit  [facility] and the medical providers at [facility] strive to bring you the best medical care.  In doing so we not only want to address your current medical conditions and concerns but also to detect new conditions early and prevent illness, disease and health-related problems.    Medicare offers a yearly Wellness Visit which allows our clinical staff to assess your need for preventative services including immunizations,  lifestyle education, counseling to decrease risk of preventable diseases and screening for fall risk and other medical concerns.    This visit is provided free of charge (no copay) for all Medicare recipients. The clinical pharmacists at [facility] have begun to conduct these Wellness Visits which will also include a thorough review of all your medications.    As you primary medical provider recommend that you make an appointment for your Annual Wellness Visit if you have not done so already this year.  You may set up this appointment before you leave today or you may call back ([phone]) and schedule an appointment.  Please make sure when you call that you mention that you are scheduling your Annual Wellness Visit with the clinical pharmacist so that the appointment may be made for the proper length of time.     Continue current medications. Continue good therapeutic lifestyle changes which include good diet and exercise. Fall precautions discussed with patient. If an FOBT was given today- please return it to our front desk. If you are over 49 years old - you may need Prevnar 92 or the adult Pneumonia vaccine.  **Flu shots will be available soon--- please call and schedule a FLU-CLINIC appointment**  After your visit with [place] today you will receive a survey in the mail or online from [employer] regarding your care with [place]. Please take a moment to fill this out. Your feedback is very important to [place] as you can help [place] better understand your patient needs as well as improve your experience and satisfaction. WE CARE ABOUT YOU!!!   **Please join [place] SEPT.[email or web address], 2016 from 5:00 to 7:00pm for our OPEN HOUSE! Come out and meet our NEW providers** Continue to follow-up with hematology and urology as planned Stay active and drink plenty of fluids even during the winter months We will call you with the results of the lab work as soon as they become available Follow-up with physical  therapy as recommended by the orthopedic surgeon for the back and then follow-up with orthopedist if problems continue with the back   Arrie Senate MD

## 2014-12-18 NOTE — Patient Instructions (Addendum)
Medicare Annual Wellness Visit  [facility] and the medical providers at [facility] strive to bring you the best medical care.  In doing so we not only want to address your current medical conditions and concerns but also to detect new conditions early and prevent illness, disease and health-related problems.    Medicare offers a yearly Wellness Visit which allows our clinical staff to assess your need for preventative services including immunizations, lifestyle education, counseling to decrease risk of preventable diseases and screening for fall risk and other medical concerns.    This visit is provided free of charge (no copay) for all Medicare recipients. The clinical pharmacists at [facility] have begun to conduct these Wellness Visits which will also include a thorough review of all your medications.    As you primary medical provider recommend that you make an appointment for your Annual Wellness Visit if you have not done so already this year.  You may set up this appointment before you leave today or you may call back ([phone]) and schedule an appointment.  Please make sure when you call that you mention that you are scheduling your Annual Wellness Visit with the clinical pharmacist so that the appointment may be made for the proper length of time.     Continue current medications. Continue good therapeutic lifestyle changes which include good diet and exercise. Fall precautions discussed with patient. If an FOBT was given today- please return it to our front desk. If you are over 22 years old - you may need Prevnar 34 or the adult Pneumonia vaccine.  **Flu shots will be available soon--- please call and schedule a FLU-CLINIC appointment**  After your visit with [place] today you will receive a survey in the mail or online from [employer] regarding your care with [place]. Please take a moment to fill this out. Your feedback is  very important to [place] as you can help [place] better understand your patient needs as well as improve your experience and satisfaction. WE CARE ABOUT YOU!!!   **Please join [place] SEPT.[email or web address], 2016 from 5:00 to 7:00pm for our OPEN HOUSE! Come out and meet our NEW providers** Continue to follow-up with hematology and urology as planned Stay active and drink plenty of fluids even during the winter months We will call you with the results of the lab work as soon as they become available Follow-up with physical therapy as recommended by the orthopedic surgeon for the back and then follow-up with orthopedist if problems continue with the back

## 2014-12-18 NOTE — Progress Notes (Signed)
Patient ID: Connor [name], male   DOB: [birth year], 69 y.o.   MRN: [id number]  Should be ok to take these two

## 2014-12-19 ENCOUNTER — Ambulatory Visit: Payer: Medicare Other | Admitting: Family Medicine

## 2014-12-19 LAB — CBC WITH DIFFERENTIAL/PLATELET
BASOS ABS: 0 10*3/uL (ref 0.0–0.2)
Basos: 1 %
EOS (ABSOLUTE): 0.1 10*3/uL (ref 0.0–0.4)
Eos: 3 %
HEMOGLOBIN: 12.5 g/dL — AB (ref 12.6–17.7)
Hematocrit: 40.4 % (ref 37.5–51.0)
IMMATURE GRANS (ABS): 0 10*3/uL (ref 0.0–0.1)
IMMATURE GRANULOCYTES: 0 %
LYMPHS: 23 %
Lymphocytes Absolute: 0.9 10*3/uL (ref 0.7–3.1)
MCH: 24.8 pg — ABNORMAL LOW (ref 26.6–33.0)
MCHC: 30.9 g/dL — ABNORMAL LOW (ref 31.5–35.7)
MCV: 80 fL (ref 79–97)
MONOCYTES: 8 %
Monocytes Absolute: 0.3 10*3/uL (ref 0.1–0.9)
NEUTROS ABS: 2.5 10*3/uL (ref 1.4–7.0)
NEUTROS PCT: 65 %
Platelets: 130 10*3/uL — ABNORMAL LOW (ref 150–379)
RBC: 5.04 x10E6/uL (ref 4.14–5.80)
RDW: 17.4 % — ABNORMAL HIGH (ref 12.3–15.4)
WBC: 3.8 10*3/uL (ref 3.4–10.8)

## 2014-12-19 LAB — NMR, LIPOPROFILE
Cholesterol: 113 mg/dL (ref 100–199)
HDL Cholesterol by NMR: 36 mg/dL — ABNORMAL LOW (ref >39)
HDL Particle Number: 29 umol/L — ABNORMAL LOW (ref >=30.5)
LDL PARTICLE NUMBER: 881 nmol/L (ref <1000)
LDL SIZE: 20.3 nm (ref >20.5)
LDL-C: 60 mg/dL (ref 0–99)
LP-IR Score: 69 — ABNORMAL HIGH (ref <=45)
SMALL LDL PARTICLE NUMBER: 518 nmol/L (ref <=527)
Triglycerides by NMR: 86 mg/dL (ref 0–149)

## 2014-12-19 LAB — BMP8+EGFR
BUN / CREAT RATIO: 22 (ref 10–22)
BUN: 24 mg/dL (ref 8–27)
CALCIUM: 9.1 mg/dL (ref 8.6–10.2)
CO2: 26 mmol/L (ref 18–29)
Chloride: 104 mmol/L (ref 97–108)
Creatinine, Ser: 1.07 mg/dL (ref 0.76–1.27)
GFR, EST AFRICAN AMERICAN: 80 mL/min/{1.73_m2} (ref >59)
GFR, EST NON AFRICAN AMERICAN: 69 mL/min/{1.73_m2} (ref >59)
Glucose: 105 mg/dL — ABNORMAL HIGH (ref 65–99)
Potassium: 4.4 mmol/L (ref 3.5–5.2)
Sodium: 142 mmol/L (ref 134–144)

## 2014-12-19 LAB — HEPATIC FUNCTION PANEL
ALT: 12 IU/L (ref 0–44)
AST: 16 IU/L (ref 0–40)
Albumin: 4.1 g/dL (ref 3.5–4.8)
Alkaline Phosphatase: 65 IU/L (ref 39–117)
BILIRUBIN TOTAL: 0.6 mg/dL (ref 0.0–1.2)
BILIRUBIN, DIRECT: 0.14 mg/dL (ref 0.00–0.40)
Total Protein: 6 g/dL (ref 6.0–8.5)

## 2014-12-19 LAB — VITAMIN D 25 HYDROXY (VIT D DEFICIENCY, FRACTURES): Vit D, 25-Hydroxy: 61.3 ng/mL (ref 30.0–100.0)

## 2014-12-20 ENCOUNTER — Encounter: Payer: Self-pay | Admitting: Family

## 2014-12-20 ENCOUNTER — Ambulatory Visit (HOSPITAL_BASED_OUTPATIENT_CLINIC_OR_DEPARTMENT_OTHER): Payer: Medicare Other | Admitting: Family

## 2014-12-20 ENCOUNTER — Other Ambulatory Visit (HOSPITAL_BASED_OUTPATIENT_CLINIC_OR_DEPARTMENT_OTHER): Payer: Medicare Other

## 2014-12-20 VITALS — BP 122/64 | HR 79 | Temp 97.4°F | Resp 16 | Ht 69.0 in | Wt 234.0 lb

## 2014-12-20 DIAGNOSIS — J45909 Unspecified asthma, uncomplicated: Secondary | ICD-10-CM | POA: Diagnosis present

## 2014-12-20 DIAGNOSIS — I6523 Occlusion and stenosis of bilateral carotid arteries: Secondary | ICD-10-CM

## 2014-12-20 DIAGNOSIS — D45 Polycythemia vera: Secondary | ICD-10-CM

## 2014-12-20 DIAGNOSIS — R7889 Finding of other specified substances, not normally found in blood: Secondary | ICD-10-CM | POA: Diagnosis not present

## 2014-12-20 LAB — CBC WITH DIFFERENTIAL (CANCER CENTER ONLY)
BASO#: 0 10*3/uL (ref 0.0–0.2)
BASO%: 0.4 % (ref 0.0–2.0)
EOS%: 3.2 % (ref 0.0–7.0)
Eosinophils Absolute: 0.2 10*3/uL (ref 0.0–0.5)
HCT: 40.6 % (ref 38.7–49.9)
HGB: 12.5 g/dL — ABNORMAL LOW (ref 13.0–17.1)
LYMPH#: 0.9 10*3/uL (ref 0.9–3.3)
LYMPH%: 19.2 % (ref 14.0–48.0)
MCH: 25.1 pg — ABNORMAL LOW (ref 28.0–33.4)
MCHC: 30.8 g/dL — AB (ref 32.0–35.9)
MCV: 81 fL — ABNORMAL LOW (ref 82–98)
MONO#: 0.4 10*3/uL (ref 0.1–0.9)
MONO%: 8 % (ref 0.0–13.0)
NEUT%: 69.2 % (ref 40.0–80.0)
NEUTROS ABS: 3.3 10*3/uL (ref 1.5–6.5)
PLATELETS: 114 10*3/uL — AB (ref 145–400)
RBC: 4.99 10*6/uL (ref 4.20–5.70)
RDW: 17.2 % — AB (ref 11.1–15.7)
WBC: 4.8 10*3/uL (ref 4.0–10.0)

## 2014-12-20 LAB — FERRITIN: FERRITIN: 7 ng/mL — AB (ref 22–322)

## 2014-12-20 LAB — IRON AND TIBC
%SAT: 13 % — ABNORMAL LOW (ref 15–60)
Iron: 49 ug/dL — ABNORMAL LOW (ref 50–180)
TIBC: 387 ug/dL (ref 250–425)
UIBC: 338 ug/dL (ref 125–400)

## 2014-12-20 LAB — CHCC SATELLITE - SMEAR

## 2014-12-20 NOTE — Progress Notes (Signed)
Hematology and Oncology Follow Up Visit  Connor [name] [id number] May 19, 1945 69 y.o. 12/20/2014   Principle Diagnosis:  Polycythemia vera-Triple negative Chronic immune thrombocytopenia vs medication  Current Therapy:   Phlebotomy to maintain hematocrit below 45% Aspirin 81 mg by mouth daily    Interim History:  Connor [name] is here today with his wife for a follow-up. He is doing well. He states that he has has been able to "hear his heart beat in his ears." His BP at the time at home was 102/68 with a HR of 90. He denies pain or headache. He may be iron deficient.  His last phlebotomy was in November of last year. His Hct today is 40.6 so we will not need to phlebotomize him today.  His platelet count is 114. He has had no episodes of bleeding, bruising or petechiae.  He denies fever, chills, n/v, cough, rash, dizziness, SOB, chest pain, palpitations, abdominal pain, changes in bowel or bladder habits. He has had no blood in his urine or stool. He uses Mirilax for relief with constipation.  No swelling, tenderness, numbness or tingling in his extremities. He is still having issues with his back but is trying to stay active.  His appetite is good and he is making sure to stay hydrated. His weight is stable.  Medications:    Medication List       This list is accurate as of: 12/20/14 11:15 AM.  Always use your most recent med list.               alfuzosin 10 MG 24 hr tablet  Commonly known as:  UROXATRAL  Take 10 mg by mouth every morning.     aspirin 81 MG tablet  Take 81 mg by mouth 2 (two) times daily.     celecoxib 200 MG capsule  Commonly known as:  CELEBREX  Take 1 capsule by mouth daily.     CENTRUM SILVER ULTRA MENS Tabs  Take 1 tablet by mouth every morning. Once a day     CO Q 10 PO  Take 1 capsule by mouth daily.     COZAAR 25 MG tablet  Generic drug:  losartan  Take 25 mg by mouth daily.     cyclobenzaprine 10 MG tablet  Commonly known as:  FLEXERIL    Take 1 tablet (10 mg total) by mouth 3 (three) times daily as needed for muscle spasms.     fish oil-omega-3 fatty acids 1000 MG capsule  Take 2 g by mouth daily.     GLUCOSAMINE CHONDR 1500 COMPLX Caps  Take by mouth every morning.     Ipratropium-Albuterol 20-100 MCG/ACT Aers respimat  Commonly known as:  COMBIVENT  Inhale 1 puff into the lungs as needed for wheezing or shortness of breath.     montelukast 10 MG tablet  Commonly known as:  SINGULAIR  Take 1 tablet (10 mg total) by mouth at bedtime.     PHILLIPS COLON HEALTH PO  Take 1 capsule by mouth daily.     polyethylene glycol packet  Commonly known as:  MIRALAX / GLYCOLAX  Take 17 g by mouth as needed.     pyridOXINE 100 MG tablet  Commonly known as:  VITAMIN B-6  Take 100 mg by mouth daily.     rosuvastatin 10 MG tablet  Commonly known as:  CRESTOR  Take 1 tablet (10 mg total) by mouth daily.     Turmeric 500 MG Caps  Take by  mouth every morning.     Vitamin D-3 5000 UNITS Tabs  Take 1 tablet by mouth daily.        Allergies:  Allergies  Allergen Reactions  . Dexlansoprazole Diarrhea and Nausea Only  . Dexlansoprazole Diarrhea and Nausea Only    unknown    Past Medical History, Surgical history, Social history, and Family History were reviewed and updated.  Review of Systems: All other 10 point review of systems is negative.   Physical Exam:  height is 5\' 9"  (1.753 m) and weight is 234 lb (106.142 kg). His oral temperature is 97.4 F (36.3 C). His blood pressure is 122/64 and his pulse is 79. His respiration is 16.   Wt Readings from Last 3 Encounters:  12/20/14 234 lb (106.142 kg)  12/18/14 234 lb (106.142 kg)  10/02/14 234 lb (106.142 kg)    Ocular: Sclerae unicteric, pupils equal, round and reactive to light Ear-nose-throat: Oropharynx clear, dentition fair Lymphatic: No cervical or supraclavicular adenopathy Lungs no rales or rhonchi, good excursion bilaterally Heart regular rate and  rhythm, no murmur appreciated Abd soft, nontender, positive bowel sounds MSK no focal spinal tenderness, no joint edema Neuro: non-focal, well-oriented, appropriate affect  Lab Results  Component Value Date   WBC 3.8 12/18/2014   HGB 12.8* 10/02/2014   HCT 40.4 12/18/2014   MCV 80* 10/02/2014   PLT 152 Platelet count confirmed by slide estimate 10/02/2014   Lab Results  Component Value Date   FERRITIN 6* 10/02/2014   IRON 52 10/02/2014   TIBC 388 10/02/2014   UIBC 336 10/02/2014   IRONPCTSAT 13* 10/02/2014   Lab Results  Component Value Date   RETICCTPCT 0.8 09/06/2013   RBC 5.04 12/18/2014   RETICCTABS 40.2 09/06/2013   No results found for: KPAFRELGTCHN, LAMBDASER, KAPLAMBRATIO No results found for: Kandis Cocking, IGMSERUM No results found for: Odetta Pink, SPEI   Chemistry      Component Value Date/Time   NA 142 12/18/2014 1007   NA 143 10/02/2014 1007   NA 144 06/19/2014 1047   K 4.4 12/18/2014 1007   K 4.5 06/19/2014 1047   CL 104 12/18/2014 1007   CL 104 06/19/2014 1047   CO2 26 12/18/2014 1007   CO2 29 06/19/2014 1047   BUN 24 12/18/2014 1007   BUN 29* 10/02/2014 1007   BUN 22 06/19/2014 1047   CREATININE 1.07 12/18/2014 1007   CREATININE 1.1 06/19/2014 1047      Component Value Date/Time   CALCIUM 9.1 12/18/2014 1007   CALCIUM 9.5 06/19/2014 1047   ALKPHOS 65 12/18/2014 1007   ALKPHOS 61 06/19/2014 1047   AST 16 12/18/2014 1007   AST 34 06/19/2014 1047   ALT 12 12/18/2014 1007   ALT 20 06/19/2014 1047   BILITOT 0.6 12/18/2014 1007   BILITOT 0.6 10/02/2014 1007   BILITOT 0.90 06/19/2014 1047     Impression and Plan: Connor [name] is a 69 year old gentleman with polycythemia and ITP. He is doing well and only states that he is "hearing his heart beat in his ears." He does not have HTN or headache with this.  His iron studies show that he is iron deficient. I discussed this with [name] and it  was decided that we will not replace this.  His Hct today is 40.6 so he will not need phlebotomized.  His platelet count is 114. He is asymptomatic with this.   We will plan to see him back  in 3 months for labs and follow-up.  He knows to contact us with any questions or concerns. We can certainly see him sooner if need be.   Eliezer Bottom, NP 9/9/201611:15 AM

## 2014-12-27 ENCOUNTER — Ambulatory Visit: Payer: Medicare Other | Admitting: Physical Therapy

## 2014-12-27 DIAGNOSIS — M545 Low back pain, unspecified: Secondary | ICD-10-CM

## 2014-12-27 NOTE — Therapy (Signed)
[facility] [place], [place], [address] Phone: [phone]   Fax:  [phone]  Physical Therapy Evaluation  Patient Details  Name: Connor [name] MRN: [id number] Date of Birth: 1945-11-11 Referring Provider:  Susa Day, MD  Encounter Date: 12/27/2014      PT End of Session - 12/27/14 2131    Visit Number 1   Number of Visits 12   Date for PT Re-Evaluation 02/14/15   PT Start Time 0900   PT Stop Time 0948   PT Time Calculation (min) 48 min   Activity Tolerance Patient tolerated treatment well   Behavior During Therapy [facility] for tasks assessed/performed      Past Medical History  Diagnosis Date  . Hyperlipemia   . Nephrolithiasis   . Carotid artery stenosis   . Cough 05/26/2007  . COPD, mild 05/26/2007    PFT 09/09/11>>FEV1 3.06 (95%), FEV1% 69, FEF 25-75% 1.70 (59%), TLC 7.67 (109%), DLCO 108%, no BD    . GERD (gastroesophageal reflux disease)   . Shortness of breath     with exertion   . Sleep apnea     Cpap-   . Cancer     squamous cell on nose , inside nose   . Erectile dysfunction   . Peyronie's disease   . Anemia   . BPH (benign prostatic hyperplasia)   . Cardiomegaly   . H/O vitamin D deficiency   . Thrombocytopenia   . Iron deficiency anemia, unspecified 09/01/2012  . Polycythemia vera(238.4) 04/19/2013  . Hx of echocardiogram 02/15/2012    This showed mild concentric LVH. EF > 55%. H edid have grade 1 diastolic dysfunction with mitral valve E:A ratio of 0.81, his left atrium was mildly dilated by volume assessment at 33.3 mL/m2. He did have mild tricupid regurgitation with upper normal RV systolic pressure at 22WL. He had aortic valve sclerosis without stenosis.  [name] History of nuclear stress test     this revealed normal myocardial perfusion and function. Post stress EF was 57%. There was no region of scar or ischemia.    Past Surgical History  Procedure Laterality Date  . Appendectomy    . Knee  surgery      right  . Shoulder surgery      left  . Nose surgery  2013  . Laparoscopic nissen fundoplication  7/98/9211    Procedure: LAPAROSCOPIC NISSEN FUNDOPLICATION;  Surgeon: Pedro Earls, MD;  Location: [facility];  Service: General;  Laterality: N/A;  . Hiatal hernia repair  11/24/2011    Procedure: LAPAROSCOPIC REPAIR OF HIATAL HERNIA;  Surgeon: Pedro Earls, MD;  Location: [facility];  Service: General;;    There were no vitals filed for this visit.  Visit Diagnosis:  Midline low back pain without sciatica - Plan: PT plan of care cert/re-cert      Subjective Assessment - 12/27/14 0904    Subjective Low pain right now.   Patient Stated Goals Perform ADL's without pain.   Currently in Pain? Yes   Pain Score 2    Pain Location Back   Pain Orientation Mid   Pain Descriptors / Indicators Aching   Pain Onset More than a month ago   Pain Frequency Intermittent   Aggravating Factors  Sitting.  Twisting.   Pain Relieving Factors TENS and ice.   Multiple Pain Sites Yes            [facility] PT Assessment - 12/27/14 0001    Assessment  Medical Diagnosis Lumbar DDD.   Onset Date/Surgical Date --  Ongoing.   Precautions   Precautions None   Restrictions   Weight Bearing Restrictions No   Balance Screen   Has the patient fallen in the past 6 months No   Has the patient had a decrease in activity level because of a fear of falling?  No   Is the patient reluctant to leave their home because of a fear of falling?  No   Home Ecologist residence   Prior Function   Level of Independence Independent   Posture/Postural Control   Posture/Postural Control No significant limitations   ROM / Strength   AROM / PROM / Strength AROM;Strength   AROM   Overall AROM Comments Normal active lumbar range of motion.   Strength   Overall Strength Comments Normal bilateral LE strength.   Palpation   Palpation comment No palpable pain today.   Special Tests     Special Tests Lumbar   Lumbar Tests Straight Leg Raise   Straight Leg Raise   Findings Negative   Side  --  Normal bil LE DTR's.   Functional Gait  Assessment   Gait assessed  --  Normal gait cycle.                   [facility] Adult PT Treatment/Exercise - 01/06/15 0001    Modalities   Modalities Traction   Traction   Type of Traction Lumbar   Min (lbs) 5   Max (lbs) 90   Hold Time 99   Rest Time 5                  PT Short Term Goals - 2015/01/06 2133    PT SHORT TERM GOAL #1   Title Ind with a HEP.   Time 1   Period Weeks   Status New           PT Long Term Goals - 01-06-15 07/22/31    PT LONG TERM GOAL #1   Title Perform ADL's with pain not > 3/10.   Time 6   Period Weeks   Status New   PT LONG TERM GOAL #2   Title No flare-ups of back pain reported.   PT LONG TERM GOAL #3   Title Sit 30 minutes with pain not > 3/10.   Time 6   Period Weeks   Status New               Plan - 2015/01/06 0904    Clinical Impression Statement Patient states he began to experience low back pain in January in 07-22-14 after rising from a chair.  The onset of pain was intense.  I played. golf at the beach and my back went into spasm. Dr. Hanley Seamen me some exercises to do.     Pt will benefit from skilled therapeutic intervention in order to improve on the following deficits Pain;Decreased activity tolerance   Rehab Potential Excellent   PT Frequency 2x / week   PT Duration 6 weeks   PT Treatment/Interventions Electrical Stimulation;Moist Heat;Ultrasound;Traction;Therapeutic activities;Therapeutic exercise   PT Next Visit Plan Int traction at 100#; core exercises; bilateral lumbar paraspinal STW/M.   Consulted and Agree with Plan of Care Patient          G-Codes - 06-Jan-2015 07/21/24    Functional Assessment Tool Used Clinical judgement.   Functional Limitation Mobility: Walking and moving around   Mobility: Walking  and Moving Around Current Status [phone]) At least  20 percent but less than 40 percent impaired, limited or restricted   Mobility: Walking and Moving Around Goal Status [phone]) At least 1 percent but less than 20 percent impaired, limited or restricted       Problem List Patient Active Problem List   Diagnosis Date Noted  . Hyperlipidemia LDL goal <100 04/12/2014  . Polycythemia vera 04/19/2013  . Family history of early CAD 02/19/2013  . Iron deficiency anemia 09/01/2012  . H/O vitamin D deficiency 05/21/2012  . Diverticulosis of colon 05/21/2012  . Carotid artery stenosis, asymptomatic 05/21/2012  . BPH (benign prostatic hyperplasia)   . Cardiomegaly   . Thrombocytopenia   . Lap Nissen Fundoplication August 3976 11/26/2011  . Weight gain, abnormal 08/27/2009  . ESSENTIAL HYPERTENSION, BENIGN 08/27/2009  . OSA (obstructive sleep apnea) 02/19/2008  . COPD, mild 05/26/2007    APPLEGATE, [place] MPT 12/27/2014, 9:41 PM  [facility] [address] [place], [place], [address] Phone: [phone]   Fax:  [phone]

## 2014-12-27 NOTE — Patient Instructions (Signed)
Reviewed HEP:  S and DKTC; hip bridges; mini-crunches; trunk rotation in supine and standing extensions.

## 2014-12-30 ENCOUNTER — Ambulatory Visit (INDEPENDENT_AMBULATORY_CARE_PROVIDER_SITE_OTHER): Payer: Medicare Other | Admitting: Pulmonary Disease

## 2014-12-30 ENCOUNTER — Encounter: Payer: Self-pay | Admitting: Pulmonary Disease

## 2014-12-30 VITALS — BP 132/70 | HR 74 | Ht 71.0 in | Wt 237.0 lb

## 2014-12-30 DIAGNOSIS — J449 Chronic obstructive pulmonary disease, unspecified: Secondary | ICD-10-CM

## 2014-12-30 DIAGNOSIS — G4733 Obstructive sleep apnea (adult) (pediatric): Secondary | ICD-10-CM | POA: Diagnosis not present

## 2014-12-30 DIAGNOSIS — I6523 Occlusion and stenosis of bilateral carotid arteries: Secondary | ICD-10-CM

## 2014-12-30 DIAGNOSIS — Z9989 Dependence on other enabling machines and devices: Principal | ICD-10-CM

## 2014-12-30 NOTE — Patient Instructions (Signed)
Follow up in 1 year.

## 2014-12-30 NOTE — Progress Notes (Signed)
Chief Complaint  Patient presents with  . Follow-up    pt states he has some labored breathing with physical activity. pt usues combivent with relief as need. pt uses CPAP every night for 6 - 7 hours. pressure and mask is good for pt. DME: [facility]    History of Present Illness: Connor [name] is a 69 y.o. male former smoker with OSA and chronic cough with borderline COPD.  He has been doing well.  He gets winded when doing too much >> mows his yard plus several neighbors.  He does okay with routine activity.  He has to use combivent when he works outside >> otherwise not using combivent.  He is doing well with CPAP.  He does not have any issue with mask fit.  He does not feel sleepy during the day.  He notices his cough flares up more after eating if he eats too much.  He does not get cough much otherwise.   He had normal CXR in 05/13/14.  TESTS: PSG 06/08/04 >> AHI 29, SpO2 84% Auto-CPAP download 08/10/10 to 08/30/10>>Used on 21 of 21 nights with average 6hrs 46 min.  Average AHI 1.6 with optimal pressure 10 cm H2O. PFT 09/09/11>>FEV1 3.06 (95%), FEV1% 69, FEF 25-75% 1.70 (59%), TLC 7.67 (109%), DLCO 108%, no BD CPAP 10/09/12 to 11/07/12 >> Used on 29 of 30 nights with average 6 hrs 44 min. CPAP 10 cm H2O. ONO with RA 12/05/13 >> Test time 7 hrs 47 min. Mean SpO2 92.8%, low SpO2 89%. Echo 02/11/14 >> mod LVH, EF 50 to 30%, grade 1 diastolic dysfx   PMHx >> HLD, PAD, GERD s/p Nissen fundoplication, ED, BPH, Nephrolithiasis  PSHx, Medications, Allergies, Fhx, Shx reviewed.  Physical Exam: BP 132/70 mmHg  Pulse 74  Ht 5\' 11"  (1.803 m)  Wt 237 lb (107.502 kg)  BMI 33.07 kg/m2  SpO2 100%  General - pleasant HEENT - wears glasses, no sinus tenderness, clear nasal discharge, narrow nasal angles, MP 2, no exudate, no LAN Cardiac - S1S2 regular, no murmur Chest - no wheeze Abd - soft, nontender, normal bowel sounds  Ext - no edema Neuro - normal strength  Psych - normal behavior and  mood   CMP Latest Ref Rng 12/18/2014 10/02/2014 07/31/2014  Glucose 65 - 99 mg/dL 105(H) 99 162(H)  BUN 8 - 27 mg/dL 24 29(H) 19  Creatinine 0.76 - 1.27 mg/dL 1.07 1.08 1.01  Sodium 134 - 144 mmol/L 142 143 141  Potassium 3.5 - 5.2 mmol/L 4.4 4.2 4.1  Chloride 97 - 108 mmol/L 104 108 108  CO2 18 - 29 mmol/L 26 21 24   Calcium 8.6 - 10.2 mg/dL 9.1 9.7 8.9  Total Protein 6.0 - 8.5 g/dL 6.0 6.3 6.3  Albumin 3.5 - 4.8 g/dL 4.1 - -  Total Bilirubin 0.0 - 1.2 mg/dL 0.6 0.6 0.6  Alkaline Phos 39 - 117 IU/L 65 54 52  AST 0 - 40 IU/L 16 18 23   ALT 0 - 44 IU/L 12 14 18     CBC Latest Ref Rng 12/20/2014 12/18/2014 10/02/2014  WBC 4.0 - 10.0 10e3/uL 4.8 3.8 4.2  Hemoglobin 13.0 - 17.1 g/dL 12.5(L) - 12.8(L)  Hematocrit 38.7 - 49.9 % 40.6 40.4 40.6  Platelets 145 - 400 10e3/uL 114(L) - 152 Platelet count confirmed by slide estimate     Assessment/Plan:  Obstructive sleep apnea. He is compliant with therapy and reports benefit from CPAP. Plan: - continue CPAP 10 cm H2O  COPD. Mild. Plan: -  continue prn combivent - he will get flu shot next month with PCP  Chronic cough 2nd to GERD and COPD. Plan: - continue PPI per primary team   Chesley Mires, MD [place] 12/30/2014, 1:57 PM Pager:  [phone] After 3pm call: [phone]

## 2015-01-01 ENCOUNTER — Encounter: Payer: Self-pay | Admitting: Physical Therapy

## 2015-01-01 ENCOUNTER — Ambulatory Visit: Payer: Medicare Other | Admitting: Physical Therapy

## 2015-01-01 DIAGNOSIS — M545 Low back pain, unspecified: Secondary | ICD-10-CM

## 2015-01-01 NOTE — Patient Instructions (Signed)
Pelvic Tilt: Posterior - Legs Bent (Supine)  Tighten stomach and flatten back by rolling pelvis down. Hold _10___ seconds. Relax. Repeat _10-30___ times per set. Do __2__ sets per session. Do _2___ sessions per day.   Bent Leg Lift (Hook-Lying)  Tighten stomach and slowly raise right leg _5___ inches from floor. Keep trunk rigid. Hold _3___ seconds. Repeat _10___ times per set. Do ___2-3_ sets per session. Do __2__ sessions per day.   Straight Leg Raise  Tighten stomach and slowly raise locked right leg __4__ inches from floor. Repeat __10-30__ times per set. Do __2__ sets per session. Do __2__ sessions per day.  Bridging  Slowly raise buttocks from floor, keeping stomach tight. Repeat _10___ times per set. Do __2__ sets per session. Do __2__ sessions per day.    

## 2015-01-01 NOTE — Therapy (Signed)
[facility] [place], [place], [address] Phone: [phone]   Fax:  [phone]  Physical Therapy Treatment  Patient Details  Name: Connor [name] MRN: [id number] Date of Birth: September 20, 1945 Referring Provider:  [name], MD  Encounter Date: 01/01/2015      PT End of Session - 01/01/15 7035    Visit Number 2   Number of Visits 12   Date for PT Re-Evaluation 02/14/15   PT Start Time 0814   PT Stop Time 0851   PT Time Calculation (min) 37 min   Activity Tolerance Patient tolerated treatment well   Behavior During Therapy [facility] for tasks assessed/performed      Past Medical History  Diagnosis Date  . Hyperlipemia   . Nephrolithiasis   . Carotid artery stenosis   . Cough 05/26/2007  . COPD, mild 05/26/2007    PFT 09/09/11>>FEV1 3.06 (95%), FEV1% 69, FEF 25-75% 1.70 (59%), TLC 7.67 (109%), DLCO 108%, no BD    . GERD (gastroesophageal reflux disease)   . Shortness of breath     with exertion   . Sleep apnea     Cpap-   . Cancer     squamous cell on nose , inside nose   . Erectile dysfunction   . Peyronie's disease   . Anemia   . BPH (benign prostatic hyperplasia)   . Cardiomegaly   . H/O vitamin D deficiency   . Thrombocytopenia   . Iron deficiency anemia, unspecified 09/01/2012  . Polycythemia vera(238.4) 04/19/2013  . Hx of echocardiogram 02/15/2012    This showed mild concentric LVH. EF > 55%. H edid have grade 1 diastolic dysfunction with mitral valve E:A ratio of 0.81, his left atrium was mildly dilated by volume assessment at 33.3 mL/m2. He did have mild tricupid regurgitation with upper normal RV systolic pressure at 00XF. He had aortic valve sclerosis without stenosis.  [name] History of nuclear stress test     this revealed normal myocardial perfusion and function. Post stress EF was 57%. There was no region of scar or ischemia.    Past Surgical History  Procedure Laterality Date  . Appendectomy    . Knee  surgery      right  . Shoulder surgery      left  . Nose surgery  2013  . Laparoscopic nissen fundoplication  11/28/2991    Procedure: LAPAROSCOPIC NISSEN FUNDOPLICATION;  Surgeon: Pedro Earls, MD;  Location: [facility];  Service: General;  Laterality: N/A;  . Hiatal hernia repair  11/24/2011    Procedure: LAPAROSCOPIC REPAIR OF HIATAL HERNIA;  Surgeon: Pedro Earls, MD;  Location: [facility];  Service: General;;    There were no vitals filed for this visit.  Visit Diagnosis:  Midline low back pain without sciatica      Subjective Assessment - 01/01/15 0817    Subjective tolerated treatment well last visit, no pain in low back today   Patient Stated Goals Perform ADL's without pain.   Currently in Pain? No/denies                         [facility] Adult PT Treatment/Exercise - 01/01/15 0001    Exercises   Exercises Lumbar   Lumbar Exercises: Supine   Ab Set 20 reps;3 seconds   Bent Knee Raise 20 reps;3 seconds   Bridge 20 reps;3 seconds   Straight Leg Raise 20 reps;3 seconds   Modalities   Modalities  Traction   Traction   Type of Traction Lumbar   Min (lbs) 5   Max (lbs) 100   Hold Time 99   Rest Time 5                PT Education - 01/01/15 0821    Education provided Yes   Education Details HEP posture   Person(s) Educated Patient   Methods Explanation;Demonstration;Handout   Comprehension Verbalized understanding;Returned demonstration          PT Short Term Goals - 01/01/15 0832    PT SHORT TERM GOAL #1   Title Ind with a HEP.   Time 1   Period Weeks   Status Achieved           PT Long Term Goals - 01/01/15 4037    PT LONG TERM GOAL #1   Title Perform ADL's with pain not > 3/10.   Time 6   Period Weeks   Status On-going   PT LONG TERM GOAL #2   Title No flare-ups of back pain reported.   Status On-going   PT LONG TERM GOAL #3   Title Sit 30 minutes with pain not > 3/10.   Time 6   Period Weeks   Status On-going                Plan - 01/01/15 0964    Clinical Impression Statement Patient progressing with no pain reports today pre or post treatment and tolerated traction well today with 100# per MPT. Patient understands HEP and posture techniques. Met STG #1 today other LTG's ongoing due to pain level limitations yet will meet if patient continues to respond well with no pain flare ups or pain reports.   Pt will benefit from skilled therapeutic intervention in order to improve on the following deficits Pain;Decreased activity tolerance   Rehab Potential Excellent   PT Frequency 2x / week   PT Duration 6 weeks   PT Treatment/Interventions Electrical Stimulation;Moist Heat;Ultrasound;Traction;Therapeutic activities;Therapeutic exercise   PT Next Visit Plan cont with POC per MPT with Int traction/ core exercises/ bilateral lumbar paraspinal STW/M.   Consulted and Agree with Plan of Care Patient        Problem List Patient Active Problem List   Diagnosis Date Noted  . Hyperlipidemia LDL goal <100 04/12/2014  . Polycythemia vera 04/19/2013  . Family history of early CAD 02/19/2013  . Iron deficiency anemia 09/01/2012  . H/O vitamin D deficiency 05/21/2012  . Diverticulosis of colon 05/21/2012  . Carotid artery stenosis, asymptomatic 05/21/2012  . BPH (benign prostatic hyperplasia)   . Cardiomegaly   . Thrombocytopenia   . Lap Nissen Fundoplication August 3838 11/26/2011  . Weight gain, abnormal 08/27/2009  . ESSENTIAL HYPERTENSION, BENIGN 08/27/2009  . OSA (obstructive sleep apnea) 02/19/2008  . COPD, mild 05/26/2007    DUNFORD, CHRISTINA P, PTA 01/01/2015, 8:51 AM  [facility] [address] [place], [place], [address] Phone: [phone]   Fax:  [phone]

## 2015-01-03 ENCOUNTER — Encounter: Payer: Self-pay | Admitting: Physical Therapy

## 2015-01-03 ENCOUNTER — Ambulatory Visit: Payer: Medicare Other | Admitting: Physical Therapy

## 2015-01-03 DIAGNOSIS — M545 Low back pain, unspecified: Secondary | ICD-10-CM

## 2015-01-03 NOTE — Therapy (Signed)
[facility] [place], [place], [address] Phone: [phone]   Fax:  [phone]  Physical Therapy Treatment  Patient Details  Name: Connor [name] MRN: [id number] Date of Birth: October 27, 1945 Referring Provider:  [name], MD  Encounter Date: 01/03/2015      PT End of Session - 01/03/15 0828    Visit Number 3   Number of Visits 12   Date for PT Re-Evaluation 02/14/15   PT Start Time 0816   PT Stop Time 0903   PT Time Calculation (min) 47 min   Activity Tolerance Patient tolerated treatment well   Behavior During Therapy [facility] for tasks assessed/performed      Past Medical History  Diagnosis Date  . Hyperlipemia   . Nephrolithiasis   . Carotid artery stenosis   . Cough 05/26/2007  . COPD, mild 05/26/2007    PFT 09/09/11>>FEV1 3.06 (95%), FEV1% 69, FEF 25-75% 1.70 (59%), TLC 7.67 (109%), DLCO 108%, no BD    . GERD (gastroesophageal reflux disease)   . Shortness of breath     with exertion   . Sleep apnea     Cpap-   . Cancer     squamous cell on nose , inside nose   . Erectile dysfunction   . Peyronie's disease   . Anemia   . BPH (benign prostatic hyperplasia)   . Cardiomegaly   . H/O vitamin D deficiency   . Thrombocytopenia   . Iron deficiency anemia, unspecified 09/01/2012  . Polycythemia vera(238.4) 04/19/2013  . Hx of echocardiogram 02/15/2012    This showed mild concentric LVH. EF > 55%. H edid have grade 1 diastolic dysfunction with mitral valve E:A ratio of 0.81, his left atrium was mildly dilated by volume assessment at 33.3 mL/m2. He did have mild tricupid regurgitation with upper normal RV systolic pressure at 19QQ. He had aortic valve sclerosis without stenosis.  [name] History of nuclear stress test     this revealed normal myocardial perfusion and function. Post stress EF was 57%. There was no region of scar or ischemia.    Past Surgical History  Procedure Laterality Date  . Appendectomy    . Knee  surgery      right  . Shoulder surgery      left  . Nose surgery  2013  . Laparoscopic nissen fundoplication  2/29/7989    Procedure: LAPAROSCOPIC NISSEN FUNDOPLICATION;  Surgeon: Pedro Earls, MD;  Location: [facility];  Service: General;  Laterality: N/A;  . Hiatal hernia repair  11/24/2011    Procedure: LAPAROSCOPIC REPAIR OF HIATAL HERNIA;  Surgeon: Pedro Earls, MD;  Location: [facility];  Service: General;;    There were no vitals filed for this visit.  Visit Diagnosis:  Midline low back pain without sciatica      Subjective Assessment - 01/03/15 0828    Subjective Reports no pain this morning and states that pain feels pretty good this morning. Reports no problem with previous traction treatment.   Patient Stated Goals Perform ADL's without pain.   Currently in Pain? No/denies            [facility] PT Assessment - 01/03/15 0001    Assessment   Medical Diagnosis Lumbar DDD.                     [facility] Adult PT Treatment/Exercise - 01/03/15 0001    Lumbar Exercises: Supine   Ab Set 20 reps;5 seconds  Clam 20 reps   Bent Knee Raise 20 reps;3 seconds   Bridge 20 reps;3 seconds   Straight Leg Raise 20 reps;3 seconds  Bilaterally   Other Supine Lumbar Exercises 2# ball to lap x20 reps   Modalities   Modalities Traction   Traction   Type of Traction Lumbar   Min (lbs) 5   Max (lbs) 100   Hold Time 99   Rest Time 5   Time 15                  PT Short Term Goals - 01/01/15 6389    PT SHORT TERM GOAL #1   Title Ind with a HEP.   Time 1   Period Weeks   Status Achieved           PT Long Term Goals - 01/03/15 0830    PT LONG TERM GOAL #1   Title Perform ADL's with pain not > 3/10.   Time 6   Period Weeks   Status Achieved   PT LONG TERM GOAL #2   Title No flare-ups of back pain reported.   Status On-going   PT LONG TERM GOAL #3   Title Sit 30 minutes with pain not > 3/10.   Time 6   Period Weeks   Status Achieved                Plan - 01/03/15 0856    Clinical Impression Statement Patient tolerated treatment well today with no complaints of pain during today's treatment. Achieved LT goal #1 (perform  ADLs with pain <3/10) and LT goal #3 (sit 30 min pain <3/10). Performed all technqiues with verbal cueing for core activation wtih all exercises. Normal traction response noted following removal of the modalities. Experienced "a little uncomfortable" feeling and a "little stretched" feeling after treatment.    Pt will benefit from skilled therapeutic intervention in order to improve on the following deficits Pain;Decreased activity tolerance   Rehab Potential Excellent   PT Frequency 2x / week   PT Duration 6 weeks   PT Treatment/Interventions Electrical Stimulation;Moist Heat;Ultrasound;Traction;Therapeutic activities;Therapeutic exercise   PT Next Visit Plan cont with POC per MPT with Int traction/ core exercises/ bilateral lumbar paraspinal STW/M.   Consulted and Agree with Plan of Care Patient        Problem List Patient Active Problem List   Diagnosis Date Noted  . Hyperlipidemia LDL goal <100 04/12/2014  . Polycythemia vera 04/19/2013  . Family history of early CAD 02/19/2013  . Iron deficiency anemia 09/01/2012  . H/O vitamin D deficiency 05/21/2012  . Diverticulosis of colon 05/21/2012  . Carotid artery stenosis, asymptomatic 05/21/2012  . BPH (benign prostatic hyperplasia)   . Cardiomegaly   . Thrombocytopenia   . Lap Nissen Fundoplication August 3734 11/26/2011  . Weight gain, abnormal 08/27/2009  . ESSENTIAL HYPERTENSION, BENIGN 08/27/2009  . OSA (obstructive sleep apnea) 02/19/2008  . COPD, mild 05/26/2007    Wynelle Fanny, PTA 01/03/2015, 9:07 AM  [facility] [address] [place], [place], [address] Phone: [phone]   Fax:  [phone]

## 2015-01-06 ENCOUNTER — Ambulatory Visit: Payer: Medicare Other | Admitting: Physical Therapy

## 2015-01-06 ENCOUNTER — Encounter: Payer: Self-pay | Admitting: Physical Therapy

## 2015-01-06 DIAGNOSIS — M545 Low back pain, unspecified: Secondary | ICD-10-CM

## 2015-01-06 NOTE — Therapy (Signed)
[facility] [place], [place], [address] Phone: [phone]   Fax:  [phone]  Physical Therapy Treatment  Patient Details  Name: Connor [name] MRN: [id number] Date of Birth: May 16, 1945 Referring Provider:  [name], MD  Encounter Date: 01/06/2015      PT End of Session - 01/06/15 0733    Visit Number 4   Number of Visits 12   Date for PT Re-Evaluation 02/14/15   PT Start Time 0733   PT Stop Time 0818   PT Time Calculation (min) 45 min   Activity Tolerance Patient tolerated treatment well   Behavior During Therapy [facility] for tasks assessed/performed      Past Medical History  Diagnosis Date  . Hyperlipemia   . Nephrolithiasis   . Carotid artery stenosis   . Cough 05/26/2007  . COPD, mild 05/26/2007    PFT 09/09/11>>FEV1 3.06 (95%), FEV1% 69, FEF 25-75% 1.70 (59%), TLC 7.67 (109%), DLCO 108%, no BD    . GERD (gastroesophageal reflux disease)   . Shortness of breath     with exertion   . Sleep apnea     Cpap-   . Cancer     squamous cell on nose , inside nose   . Erectile dysfunction   . Peyronie's disease   . Anemia   . BPH (benign prostatic hyperplasia)   . Cardiomegaly   . H/O vitamin D deficiency   . Thrombocytopenia   . Iron deficiency anemia, unspecified 09/01/2012  . Polycythemia vera(238.4) 04/19/2013  . Hx of echocardiogram 02/15/2012    This showed mild concentric LVH. EF > 55%. H edid have grade 1 diastolic dysfunction with mitral valve E:A ratio of 0.81, his left atrium was mildly dilated by volume assessment at 33.3 mL/m2. He did have mild tricupid regurgitation with upper normal RV systolic pressure at 03KV. He had aortic valve sclerosis without stenosis.  [name] History of nuclear stress test     this revealed normal myocardial perfusion and function. Post stress EF was 57%. There was no region of scar or ischemia.    Past Surgical History  Procedure Laterality Date  . Appendectomy    . Knee  surgery      right  . Shoulder surgery      left  . Nose surgery  2013  . Laparoscopic nissen fundoplication  08/04/9561    Procedure: LAPAROSCOPIC NISSEN FUNDOPLICATION;  Surgeon: Pedro Earls, MD;  [facility];  Service: General;  Laterality: N/A;  . Hiatal hernia repair  11/24/2011    Procedure: LAPAROSCOPIC REPAIR OF HIATAL HERNIA;  Surgeon: Pedro Earls, MD;  Location: [facility];  Service: General;;    There were no vitals filed for this visit.  Visit Diagnosis:  Midline low back pain without sciatica      Subjective Assessment - 01/06/15 0733    Subjective Reports that at times he may have some stiffness but no pain. Reports that uncomfortable sensation he experienced following previous traction treatment lasted for maybe one hour.   Patient Stated Goals Perform ADL's without pain.   Currently in Pain? No/denies            [facility] PT Assessment - 01/06/15 0001    Assessment   Medical Diagnosis Lumbar DDD.                     [facility] Adult PT Treatment/Exercise - 01/06/15 0001    Lumbar Exercises: Supine   Ab  Set Other (comment)  5 sec hold x25 reps   Clam Other (comment)  x25 reps   Bent Knee Raise Other (comment)  x25 reps    Bridge Other (comment)  x25 reps with 3 sec hold   Straight Leg Raise Other (comment)  Bilaterally x25 reps    Other Supine Lumbar Exercises 2# ball to lap x25 reps   Modalities   Modalities Traction   Traction   Type of Traction Lumbar   Min (lbs) 5   Max (lbs) 100   Hold Time 99   Rest Time 5   Time 15                  PT Short Term Goals - 01/01/15 3295    PT SHORT TERM GOAL #1   Title Ind with a HEP.   Time 1   Period Weeks   Status Achieved           PT Long Term Goals - 01/03/15 0830    PT LONG TERM GOAL #1   Title Perform ADL's with pain not > 3/10.   Time 6   Period Weeks   Status Achieved   PT LONG TERM GOAL #2   Title No flare-ups of back pain reported.   Status On-going    PT LONG TERM GOAL #3   Title Sit 30 minutes with pain not > 3/10.   Time 6   Period Weeks   Status Achieved               Plan - 01/06/15 0751    Clinical Impression Statement Patient tolerated treatment well today with no complanints of pain during today's treatment. Performed all exercises with good form and under verbal cueing for core activation. Mechanical traction maintained at 100# max today secondary to uncomfortable sensation experienced following previous treatment.  Normal traction response noted following the end of the traction session. Only goal remaining is no back pain flare ups at this time. Experienced "a little uncomfortable" sensation following today's traction but no pain.   Pt will benefit from skilled therapeutic intervention in order to improve on the following deficits Pain;Decreased activity tolerance   Rehab Potential Excellent   PT Frequency 2x / week   PT Duration 6 weeks   PT Treatment/Interventions Electrical Stimulation;Moist Heat;Ultrasound;Traction;Therapeutic activities;Therapeutic exercise   PT Next Visit Plan Consider beginning standing core exercises next treatment. FOTO needed next treatment.   Consulted and Agree with Plan of Care Patient        Problem List Patient Active Problem List   Diagnosis Date Noted  . Hyperlipidemia LDL goal <100 04/12/2014  . Polycythemia vera 04/19/2013  . Family history of early CAD 02/19/2013  . Iron deficiency anemia 09/01/2012  . H/O vitamin D deficiency 05/21/2012  . Diverticulosis of colon 05/21/2012  . Carotid artery stenosis, asymptomatic 05/21/2012  . BPH (benign prostatic hyperplasia)   . Cardiomegaly   . Thrombocytopenia   . Lap Nissen Fundoplication August 1884 11/26/2011  . Weight gain, abnormal 08/27/2009  . ESSENTIAL HYPERTENSION, BENIGN 08/27/2009  . OSA (obstructive sleep apnea) 02/19/2008  . COPD, mild 05/26/2007    Wynelle Fanny, PTA 01/06/2015, 8:26 AM  [facility] [address] [place], [place], [address] Phone: [phone]   Fax:  [phone]

## 2015-01-10 ENCOUNTER — Ambulatory Visit: Payer: Medicare Other | Admitting: Physical Therapy

## 2015-01-10 DIAGNOSIS — M545 Low back pain, unspecified: Secondary | ICD-10-CM

## 2015-01-10 NOTE — Therapy (Signed)
[facility] [place], [place], [address] Phone: [phone]   Fax:  [phone]  Physical Therapy Treatment  Patient Details  Name: Connor [name] MRN: [id number] Date of Birth: 1945/08/09 Referring Provider:  [name], MD  Encounter Date: 01/10/2015      PT End of Session - 01/10/15 1323    Visit Number 5   Number of Visits 12   Date for PT Re-Evaluation 02/14/15   PT Start Time 0900   PT Stop Time 0937   PT Time Calculation (min) 37 min   Activity Tolerance Patient tolerated treatment well   Behavior During Therapy [facility] for tasks assessed/performed      Past Medical History  Diagnosis Date  . Hyperlipemia   . Nephrolithiasis   . Carotid artery stenosis   . Cough 05/26/2007  . COPD, mild 05/26/2007    PFT 09/09/11>>FEV1 3.06 (95%), FEV1% 69, FEF 25-75% 1.70 (59%), TLC 7.67 (109%), DLCO 108%, no BD    . GERD (gastroesophageal reflux disease)   . Shortness of breath     with exertion   . Sleep apnea     Cpap-   . Cancer     squamous cell on nose , inside nose   . Erectile dysfunction   . Peyronie's disease   . Anemia   . BPH (benign prostatic hyperplasia)   . Cardiomegaly   . H/O vitamin D deficiency   . Thrombocytopenia   . Iron deficiency anemia, unspecified 09/01/2012  . Polycythemia vera(238.4) 04/19/2013  . Hx of echocardiogram 02/15/2012    This showed mild concentric LVH. EF > 55%. H edid have grade 1 diastolic dysfunction with mitral valve E:A ratio of 0.81, his left atrium was mildly dilated by volume assessment at 33.3 mL/m2. He did have mild tricupid regurgitation with upper normal RV systolic pressure at 32IZ. He had aortic valve sclerosis without stenosis.  [name] History of nuclear stress test     this revealed normal myocardial perfusion and function. Post stress EF was 57%. There was no region of scar or ischemia.    Past Surgical History  Procedure Laterality Date  . Appendectomy    . Knee  surgery      right  . Shoulder surgery      left  . Nose surgery  2013  . Laparoscopic nissen fundoplication  05/06/5807    Procedure: LAPAROSCOPIC NISSEN FUNDOPLICATION;  Surgeon: Pedro Earls, MD;  Location: [facility];  Service: General;  Laterality: N/A;  . Hiatal hernia repair  11/24/2011    Procedure: LAPAROSCOPIC REPAIR OF HIATAL HERNIA;  Surgeon: Pedro Earls, MD;  Location: [facility];  Service: General;;    There were no vitals filed for this visit.  Visit Diagnosis:  Midline low back pain without sciatica      Subjective Assessment - 01/10/15 1320    Subjective Did my exercises already.  Back is doing better.  Just a little sore.   Pain Score 2    Pain Location Back   Pain Orientation Mid   Pain Descriptors / Indicators Aching   Pain Onset More than a month ago                         [facility] Adult PT Treatment/Exercise - 01/10/15 0001    Modalities   Modalities Moist Heat   Moist Heat Therapy   Number Minutes Moist Heat 15 Minutes   Moist Heat Location Lumbar  Spine   Traction   Type of Traction Lumbar   Min (lbs) 5   Max (lbs) 105   Hold Time 99 sec.   Rest Time 5 sec   Time 15                  PT Short Term Goals - 01/01/15 9892    PT SHORT TERM GOAL #1   Title Ind with a HEP.   Time 1   Period Weeks   Status Achieved           PT Long Term Goals - 01/03/15 0830    PT LONG TERM GOAL #1   Title Perform ADL's with pain not > 3/10.   Time 6   Period Weeks   Status Achieved   PT LONG TERM GOAL #2   Title No flare-ups of back pain reported.   Status On-going   PT LONG TERM GOAL #3   Title Sit 30 minutes with pain not > 3/10.   Time 6   Period Weeks   Status Achieved               Plan - 01/10/15 1322    PT Next Visit Plan Advance core exercises.  Int traction at 110 pounds.        Problem List Patient Active Problem List   Diagnosis Date Noted  . Hyperlipidemia LDL goal <100 04/12/2014  .  Polycythemia vera 04/19/2013  . Family history of early CAD 02/19/2013  . Iron deficiency anemia 09/01/2012  . H/O vitamin D deficiency 05/21/2012  . Diverticulosis of colon 05/21/2012  . Carotid artery stenosis, asymptomatic 05/21/2012  . BPH (benign prostatic hyperplasia)   . Cardiomegaly   . Thrombocytopenia   . Lap Nissen Fundoplication August 1194 11/26/2011  . Weight gain, abnormal 08/27/2009  . ESSENTIAL HYPERTENSION, BENIGN 08/27/2009  . OSA (obstructive sleep apnea) 02/19/2008  . COPD, mild 05/26/2007    APPLEGATE, [place] MPT 01/10/2015, 1:24 PM  [facility] [address] [place], [place], [address] Phone: [phone]   Fax:  [phone]

## 2015-01-23 ENCOUNTER — Other Ambulatory Visit: Payer: Self-pay | Admitting: Cardiovascular Disease

## 2015-01-23 NOTE — Telephone Encounter (Signed)
Rx(s) sent to pharmacy electronically.  

## 2015-02-17 ENCOUNTER — Other Ambulatory Visit: Payer: Self-pay | Admitting: Hematology & Oncology

## 2015-03-13 ENCOUNTER — Other Ambulatory Visit: Payer: Self-pay | Admitting: Cardiovascular Disease

## 2015-03-13 NOTE — Telephone Encounter (Signed)
REFILL 

## 2015-03-19 ENCOUNTER — Ambulatory Visit: Payer: Medicare Other | Attending: Specialist | Admitting: Physical Therapy

## 2015-03-19 DIAGNOSIS — M545 Low back pain, unspecified: Secondary | ICD-10-CM

## 2015-03-19 NOTE — Therapy (Signed)
[facility] [place], [place], [address] Phone: [phone]   Fax:  [phone]  Physical Therapy Treatment  Patient Details  Name: Connor [name] MRN: [id number] Date of Birth: 12/10/1945 Referring Provider: Susa Day MD.  Encounter Date: 03/19/2015      PT End of Session - 03/19/15 0958    Visit Number 1   Number of Visits 12   Date for PT Re-Evaluation 04/30/15   PT Start Time 0900   PT Stop Time 0948   PT Time Calculation (min) 48 min   Activity Tolerance Patient tolerated treatment well   Behavior During Therapy [facility] for tasks assessed/performed      Past Medical History  Diagnosis Date  . Hyperlipemia   . Nephrolithiasis   . Carotid artery stenosis   . Cough 05/26/2007  . COPD, mild 05/26/2007    PFT 09/09/11>>FEV1 3.06 (95%), FEV1% 69, FEF 25-75% 1.70 (59%), TLC 7.67 (109%), DLCO 108%, no BD    . GERD (gastroesophageal reflux disease)   . Shortness of breath     with exertion   . Sleep apnea     Cpap-   . Cancer     squamous cell on nose , inside nose   . Erectile dysfunction   . Peyronie's disease   . Anemia   . BPH (benign prostatic hyperplasia)   . Cardiomegaly   . H/O vitamin D deficiency   . Thrombocytopenia   . Iron deficiency anemia, unspecified 09/01/2012  . Polycythemia vera(238.4) 04/19/2013  . Hx of echocardiogram 02/15/2012    This showed mild concentric LVH. EF > 55%. H edid have grade 1 diastolic dysfunction with mitral valve E:A ratio of 0.81, his left atrium was mildly dilated by volume assessment at 33.3 mL/m2. He did have mild tricupid regurgitation with upper normal RV systolic pressure at [id number]. He had aortic valve sclerosis without stenosis.  [name] History of nuclear stress test     this revealed normal myocardial perfusion and function. Post stress EF was 57%. There was no region of scar or ischemia.    Past Surgical History  Procedure Laterality Date  . Appendectomy    . Knee  surgery      right  . Shoulder surgery      left  . Nose surgery  2013  . Laparoscopic nissen fundoplication  [id number]    Procedure: LAPAROSCOPIC NISSEN FUNDOPLICATION;  Surgeon: Pedro Earls, MD;  Location: [facility];  Service: General;  Laterality: N/A;  . Hiatal hernia repair  11/24/2011    Procedure: LAPAROSCOPIC REPAIR OF HIATAL HERNIA;  Surgeon: Pedro Earls, MD;  Location: [facility];  Service: General;;    There were no vitals filed for this visit.  Visit Diagnosis:  Midline low back pain without sciatica - Plan: PT plan of care cert/re-cert      Subjective Assessment - 03/19/15 0940    Subjective Overall my back is better but I ned to be more consistent with my exercises.   Patient Stated Goals Perform ADL's without pain.   Currently in Pain? Yes   Pain Score 1    Pain Location Back   Pain Orientation Mid   Pain Descriptors / Indicators Dull   Pain Type Chronic pain   Pain Onset More than a month ago   Aggravating Factors  Sitting.   Pain Relieving Factors Traction and exercise.            [facility] PT Assessment - 03/19/15 0001  Assessment   Medical Diagnosis Displacement, lumbar disc without myelopathy.   Referring Provider Susa Day MD.   Onset Date/Surgical Date --  Ongoing.   Precautions   Precautions None   Restrictions   Weight Bearing Restrictions No   Balance Screen   Has the patient fallen in the past 6 months No   Has the patient had a decrease in activity level because of a fear of falling?  No   Is the patient reluctant to leave their home because of a fear of falling?  No   Home Ecologist residence   Prior Function   Level of Independence Independent   Posture/Postural Control   Posture/Postural Control No significant limitations   AROM   Overall AROM Comments Lumbar intervertebral movement into active flexion is limited by 50%.  Active lumbar extension= 19 degrees.   Strength   Overall Strength  Comments Normal bilateral LE strength.   Palpation   Palpation comment Pain at L5-S1 "in" spine.   Special Tests    Special Tests Sacrolliac Tests;Leg LengthTest   Sacroiliac Tests  --  Negative FABER testing.   Leg length test  --  Equal leg lengths.   Straight Leg Raise   Findings Negative   Side  --  Bil (-).   Ambulation/Gait   Gait Comments Normal giat pattern.                     [facility] Adult PT Treatment/Exercise - 03-30-2015 0001    Traction   Type of Traction Lumbar   Min (lbs) 5   Max (lbs) 115   Hold Time 99   Rest Time 5   Time 15                  PT Short Term Goals - 01/01/15 [id number]    PT SHORT TERM GOAL #1   Title Ind with a HEP.   Time 1   Period Weeks   Status Achieved           PT Long Term Goals - 30-Mar-2015 1004    PT LONG TERM GOAL #1   Title Ind with advanced HEP.   Time 4   Period Weeks   Status New   PT LONG TERM GOAL #2   Title No flare-ups of back pain reported.   Time 4   Status New               Plan - 03/30/2015 [id number]    Clinical Impression Statement The patient returns to physical therapy with a low pain level in his low back.  He does states that he is still having flare-ups especially after sittnig for a awhile and then getting up which has caused his pain-level to rise to a 10/10.   Pt will benefit from skilled therapeutic intervention in order to improve on the following deficits Pain;Decreased activity tolerance;Decreased range of motion   Rehab Potential Excellent   PT Frequency 3x / week   PT Duration 4 weeks   PT Treatment/Interventions Electrical Stimulation;Moist Heat;Ultrasound;Traction;Therapeutic activities;Therapeutic exercise   PT Next Visit Plan Back extension and abdominal machine; advanced core exercises/abdominal strengthneing.  Elliptical.  Int. Traction at 120# (max at 125#).   Consulted and Agree with Plan of Care Patient          G-Codes - 03/30/2015 [id number]    Functional Assessment  Tool Used FOTO.   Functional Limitation Other PT primary   Mobility:  Walking and Moving Around Current Status [phone]) At least 20 percent but less than 40 percent impaired, limited or restricted   Mobility: Walking and Moving Around Goal Status [phone]) At least 1 percent but less than 20 percent impaired, limited or restricted      Problem List Patient Active Problem List   Diagnosis Date Noted  . Hyperlipidemia LDL goal <100 04/12/2014  . Polycythemia vera ([place]) 04/19/2013  . Family history of early CAD 02/19/2013  . Iron deficiency anemia 09/01/2012  . H/O vitamin D deficiency 05/21/2012  . Diverticulosis of colon 05/21/2012  . Carotid artery stenosis, asymptomatic 05/21/2012  . BPH (benign prostatic hyperplasia)   . Cardiomegaly   . Thrombocytopenia ([facility])   . Lap Nissen Fundoplication August 0000000 11/26/2011  . Weight gain, abnormal 08/27/2009  . ESSENTIAL HYPERTENSION, BENIGN 08/27/2009  . OSA (obstructive sleep apnea) 02/19/2008  . COPD, mild ([place]) 05/26/2007    APPLEGATE, [place] 03/19/2015, 10:04 AM  [facility] [address] [place], [place], [address] Phone: [phone]   Fax:  [phone]  Name: ELIAZAR DANKENBRING MRN: [id number] Date of Birth: Jan 09, 1946

## 2015-03-19 NOTE — Therapy (Signed)
[facility] [place], [place], [address] Phone: [phone]   Fax:  [phone]  Physical Therapy Evaluation  Patient Details  Name: Connor [name] MRN: [id number] Date of Birth: Sep 09, 1945 Referring Provider: Susa Day MD.  Encounter Date: 03/19/2015      PT End of Session - 03/19/15 0958    Visit Number 1   Number of Visits 12   Date for PT Re-Evaluation 04/30/15   PT Start Time 0900   PT Stop Time 0948   PT Time Calculation (min) 48 min   Activity Tolerance Patient tolerated treatment well   Behavior During Therapy [facility] for tasks assessed/performed      Past Medical History  Diagnosis Date  . Hyperlipemia   . Nephrolithiasis   . Carotid artery stenosis   . Cough 05/26/2007  . COPD, mild 05/26/2007    PFT 09/09/11>>FEV1 3.06 (95%), FEV1% 69, FEF 25-75% 1.70 (59%), TLC 7.67 (109%), DLCO 108%, no BD    . GERD (gastroesophageal reflux disease)   . Shortness of breath     with exertion   . Sleep apnea     Cpap-   . Cancer     squamous cell on nose , inside nose   . Erectile dysfunction   . Peyronie's disease   . Anemia   . BPH (benign prostatic hyperplasia)   . Cardiomegaly   . H/O vitamin D deficiency   . Thrombocytopenia   . Iron deficiency anemia, unspecified 09/01/2012  . Polycythemia vera(238.4) 04/19/2013  . Hx of echocardiogram 02/15/2012    This showed mild concentric LVH. EF > 55%. H edid have grade 1 diastolic dysfunction with mitral valve E:A ratio of 0.81, his left atrium was mildly dilated by volume assessment at 33.3 mL/m2. He did have mild tricupid regurgitation with upper normal RV systolic pressure at [id number]. He had aortic valve sclerosis without stenosis.  [name] History of nuclear stress test     this revealed normal myocardial perfusion and function. Post stress EF was 57%. There was no region of scar or ischemia.    Past Surgical History  Procedure Laterality Date  . Appendectomy    . Knee  surgery      right  . Shoulder surgery      left  . Nose surgery  2013  . Laparoscopic nissen fundoplication  [id number]    Procedure: LAPAROSCOPIC NISSEN FUNDOPLICATION;  Surgeon: Pedro Earls, MD;  Location: [facility];  Service: General;  Laterality: N/A;  . Hiatal hernia repair  11/24/2011    Procedure: LAPAROSCOPIC REPAIR OF HIATAL HERNIA;  Surgeon: Pedro Earls, MD;  Location: [facility];  Service: General;;    There were no vitals filed for this visit.  Visit Diagnosis:  Midline low back pain without sciatica - Plan: PT plan of care cert/re-cert      Subjective Assessment - 03/19/15 0940    Subjective Overall my back is better but I ned to be more consistent with my exercises.   Patient Stated Goals Perform ADL's without pain.   Currently in Pain? Yes   Pain Score 1    Pain Location Back   Pain Orientation Mid   Pain Descriptors / Indicators Dull   Pain Type Chronic pain   Pain Onset More than a month ago   Aggravating Factors  Sitting.   Pain Relieving Factors Traction and exercise.            [facility] PT Assessment - 03/19/15 0001  Assessment   Medical Diagnosis Displacement, lumbar disc without myelopathy.   Referring Provider Susa Day MD.   Onset Date/Surgical Date --  Ongoing.   Precautions   Precautions None   Restrictions   Weight Bearing Restrictions No   Balance Screen   Has the patient fallen in the past 6 months No   Has the patient had a decrease in activity level because of a fear of falling?  No   Is the patient reluctant to leave their home because of a fear of falling?  No   Home Ec[employer] residence   Prior Function   Level of Independence Independent   Posture/Postural Control   Posture/Postural Control No significant limitations   AROM   Overall AROM Comments Lumbar intervertebral movement into active flexion is limited by 50%.  Active lumbar extension= 19 degrees.   Strength   Overall Strength  Comments Normal bilateral LE strength.   Palpation   Palpation comment Pain at L5-S1 "in" spine.   Special Tests    Special Tests Sacrolliac Tests;Leg LengthTest   Sacroiliac Tests  --  Negative FABER testing.   Leg length test  --  Equal leg lengths.   Straight Leg Raise   Findings Negative   Side  --  Bil (-).   Ambulation/Gait   Gait Comments Normal giat pattern.                   [facility] Adult PT Treatment/Exercise - 03/19/15 0001    Traction   Type of Traction Lumbar   Min (lbs) 5   Max (lbs) 115   Hold Time 99   Rest Time 5   Time 15                  PT Short Term Goals - 01/01/15 [id number]    PT SHORT TERM GOAL #1   Title Ind with a HEP.   Time 1   Period Weeks   Status Achieved           PT Long Term Goals - 01/03/15 0830    PT LONG TERM GOAL #1   Title Perform ADL's with pain not > 3/10.   Time 6   Period Weeks   Status Achieved   PT LONG TERM GOAL #2   Title No flare-ups of back pain reported.   Status On-going   PT LONG TERM GOAL #3   Title Sit 30 minutes with pain not > 3/10.   Time 6   Period Weeks   Status Achieved               Plan - 03/19/15 [id number]    Clinical Impression Statement The patient returns to physical therapy with a low pain level in his low back.  He does states that he is still having flare-ups especially after sittnig for a awhile and then getting up which has caused his pain-level to rise to a 10/10.   Pt will benefit from skilled therapeutic intervention in order to improve on the following deficits Pain;Decreased activity tolerance;Decreased range of motion   Rehab Potential Excellent   PT Frequency 3x / week   PT Duration 4 weeks   PT Treatment/Interventions Electrical Stimulation;Moist Heat;Ultrasound;Traction;Therapeutic activities;Therapeutic exercise   PT Next Visit Plan Back extension and abdominal machine; advanced core exercises/abdominal strengthneing.  Elliptical.  Int. Traction at 120# (max  at 125#).   Consulted and Agree with Plan of Care Patient  G-Codes - 03/19/15 [id number]    Functional Assessment Tool Used FOTO.   Functional Limitation Other PT primary   Mobility: Walking and Moving Around Current Status [phone]) At least 20 percent but less than 40 percent impaired, limited or restricted   Mobility: Walking and Moving Around Goal Status [phone]) At least 1 percent but less than 20 percent impaired, limited or restricted       Problem List Patient Active Problem List   Diagnosis Date Noted  . Hyperlipidemia LDL goal <100 04/12/2014  . Polycythemia vera ([place]) 04/19/2013  . Family history of early CAD 02/19/2013  . Iron deficiency anemia 09/01/2012  . H/O vitamin D deficiency 05/21/2012  . Diverticulosis of colon 05/21/2012  . Carotid artery stenosis, asymptomatic 05/21/2012  . BPH (benign prostatic hyperplasia)   . Cardiomegaly   . Thrombocytopenia ([place])   . Lap Nissen Fundoplication August 0000000 11/26/2011  . Weight gain, abnormal 08/27/2009  . ESSENTIAL HYPERTENSION, BENIGN 08/27/2009  . OSA (obstructive sleep apnea) 02/19/2008  . COPD, mild ([place]) 05/26/2007    Amali Uhls, [place] MPT 03/19/2015, 10:02 AM  [facility] [address] [place], [place], [address] Phone: [phone]   Fax:  [phone]  Name: KONGMENG PULKRABEK MRN: [id number] Date of Birth: [birth year]

## 2015-03-19 NOTE — Therapy (Signed)
[facility] [place], [place], [address] Phone: [phone]   Fax:  [phone]  Physical Therapy Treatment  Patient Details  Name: Connor [name] MRN: [id number] Date of Birth: [birth year] No Data Recorded  Encounter Date: 01/10/2015    Past Medical History  Diagnosis Date  . Hyperlipemia   . Nephrolithiasis   . Carotid artery stenosis   . Cough 05/26/2007  . COPD, mild 05/26/2007    PFT 09/09/11>>FEV1 3.06 (95%), FEV1% 69, FEF 25-75% 1.70 (59%), TLC 7.67 (109%), DLCO 108%, no BD    . GERD (gastroesophageal reflux disease)   . Shortness of breath     with exertion   . Sleep apnea     Cpap-   . Cancer     squamous cell on nose , inside nose   . Erectile dysfunction   . Peyronie's disease   . Anemia   . BPH (benign prostatic hyperplasia)   . Cardiomegaly   . H/O vitamin D deficiency   . Thrombocytopenia   . Iron deficiency anemia, unspecified 09/01/2012  . Polycythemia vera(238.4) 04/19/2013  . Hx of echocardiogram 02/15/2012    This showed mild concentric LVH. EF > 55%. H edid have grade 1 diastolic dysfunction with mitral valve E:A ratio of 0.81, his left atrium was mildly dilated by volume assessment at 33.3 mL/m2. He did have mild tricupid regurgitation with upper normal RV systolic pressure at 65YY. He had aortic valve sclerosis without stenosis.  [name] History of nuclear stress test     this revealed normal myocardial perfusion and function. Post stress EF was 57%. There was no region of scar or ischemia.    Past Surgical History  Procedure Laterality Date  . Appendectomy    . Knee surgery      right  . Shoulder surgery      left  . Nose surgery  2013  . Laparoscopic nissen fundoplication  08/13/5463    Procedure: LAPAROSCOPIC NISSEN FUNDOPLICATION;  Surgeon: Pedro Earls, MD;  [facility]: [facility];  Service: General;  Laterality: N/A;  . Hiatal hernia repair  11/24/2011    Procedure: LAPAROSCOPIC REPAIR OF  HIATAL HERNIA;  Surgeon: Pedro Earls, MD;  Location: [facility];  Service: General;;    There were no vitals filed for this visit.  Visit Diagnosis:  Midline low back pain without sciatica                                 PT Short Term Goals - 01/01/15 6812    PT SHORT TERM GOAL #1   Title Ind with a HEP.   Time 1   Period Weeks   Status Achieved           PT Long Term Goals - 01/03/15 0830    PT LONG TERM GOAL #1   Title Perform ADL's with pain not > 3/10.   Time 6   Period Weeks   Status Achieved   PT LONG TERM GOAL #2   Title No flare-ups of back pain reported.   Status On-going   PT LONG TERM GOAL #3   Title Sit 30 minutes with pain not > 3/10.   Time 6   Period Weeks   Status Achieved               Problem List Patient Active Problem List   Diagnosis Date Noted  . Hyperlipidemia LDL goal <  100 04/12/2014  . Polycythemia vera ([place]) 04/19/2013  . Family history of early CAD 02/19/2013  . Iron deficiency anemia 09/01/2012  . H/O vitamin D deficiency 05/21/2012  . Diverticulosis of colon 05/21/2012  . Carotid artery stenosis, asymptomatic 05/21/2012  . BPH (benign prostatic hyperplasia)   . Cardiomegaly   . Thrombocytopenia ([place])   . Lap Nissen Fundoplication August 0272 11/26/2011  . Weight gain, abnormal 08/27/2009  . ESSENTIAL HYPERTENSION, BENIGN 08/27/2009  . OSA (obstructive sleep apnea) 02/19/2008  . COPD, mild ([place]) 05/26/2007   PHYSICAL THERAPY DISCHARGE SUMMARY  Visits from Start of Care:  Current functional level related to goals / functional outcomes: Please see above.   Remaining deficits: Goal #2 unmet.   Education / Equipment: HEP. Plan: Patient agrees to discharge.  Patient goals were partially met. Patient is being discharged due to meeting the stated rehab goals.  ?????       Legacy Carrender, [place] MPT 03/19/2015, 9:33 AM  [facility] [address] [place], [place], [address] Phone: [phone]   Fax:  [phone]  Name: Connor [name] MRN: [id number] Date of Birth: [birth year]

## 2015-03-21 ENCOUNTER — Ambulatory Visit: Payer: Medicare Other | Admitting: *Deleted

## 2015-03-21 DIAGNOSIS — M545 Low back pain, unspecified: Secondary | ICD-10-CM

## 2015-03-21 NOTE — Therapy (Signed)
[facility] [place], [place], [address] Phone: [phone]   Fax:  [phone]  Physical Therapy Treatment  Patient Details  Name: Connor [name] MRN: [id number] Date of Birth: Nov 13, 1945 Referring Provider: Susa Day MD.  Encounter Date: 03/21/2015      PT End of Session - 03/21/15 0819    Visit Number 2   Number of Visits 12   Date for PT Re-Evaluation 04/30/15   PT Start Time 0815   PT Stop Time 0903   PT Time Calculation (min) 48 min      Past Medical History  Diagnosis Date  . Hyperlipemia   . Nephrolithiasis   . Carotid artery stenosis   . Cough 05/26/2007  . COPD, mild 05/26/2007    PFT 09/09/11>>FEV1 3.06 (95%), FEV1% 69, FEF 25-75% 1.70 (59%), TLC 7.67 (109%), DLCO 108%, no BD    . GERD (gastroesophageal reflux disease)   . Shortness of breath     with exertion   . Sleep apnea     Cpap-   . Cancer     squamous cell on nose , inside nose   . Erectile dysfunction   . Peyronie's disease   . Anemia   . BPH (benign prostatic hyperplasia)   . Cardiomegaly   . H/O vitamin D deficiency   . Thrombocytopenia   . Iron deficiency anemia, unspecified 09/01/2012  . Polycythemia vera(238.4) 04/19/2013  . Hx of echocardiogram 02/15/2012    This showed mild concentric LVH. EF > 55%. H edid have grade 1 diastolic dysfunction with mitral valve E:A ratio of 0.81, his left atrium was mildly dilated by volume assessment at 33.3 mL/m2. He did have mild tricupid regurgitation with upper normal RV systolic pressure at [id number]. He had aortic valve sclerosis without stenosis.  [name] History of nuclear stress test     this revealed normal myocardial perfusion and function. Post stress EF was 57%. There was no region of scar or ischemia.    Past Surgical History  Procedure Laterality Date  . Appendectomy    . Knee surgery      right  . Shoulder surgery      left  . Nose surgery  2013  . Laparoscopic nissen fundoplication   [id number]    Procedure: LAPAROSCOPIC NISSEN FUNDOPLICATION;  Surgeon: Pedro Earls, MD;  Location: [facility];  Service: General;  Laterality: N/A;  . Hiatal hernia repair  11/24/2011    Procedure: LAPAROSCOPIC REPAIR OF HIATAL HERNIA;  Surgeon: Pedro Earls, MD;  Location: [facility];  Service: General;;    There were no vitals filed for this visit.  Visit Diagnosis:  Midline low back pain without sciatica      Subjective Assessment - 03/21/15 0816    Subjective Overall my back is better but I ned to be more consistent with my exercises. Did ok with Traction   Patient Stated Goals Perform ADL's without pain.   Currently in Pain? Yes   Pain Score 2    Pain Location Back   Pain Orientation Mid   Pain Descriptors / Indicators Dull   Pain Type Chronic pain   Pain Onset More than a month ago   Aggravating Factors  sitting and then getting up   Pain Relieving Factors Traction and exs                         [facility] Adult PT Treatment/Exercise - 03/21/15 0001  Exercises   Exercises Lumbar;Knee/Hip   Lumbar Exercises: Aerobic   Elliptical L5,R5 x   5 mins. Focus on posture and core activation Draw-in   Lumbar Exercises: Standing   Other Standing Lumbar Exercises EIS x 10   Lumbar Exercises: Prone   Straight Leg Raise 20 reps;2 seconds   Other Prone Lumbar Exercises  Prone position, Prone on elbows, Prone press ups 2 x 10    Lumbar Exercises: Quadruped   Straight Leg Raise 20 reps   Traction   Type of Traction Lumbar   Min (lbs) 5   Max (lbs) --   Hold Time 99   Rest Time 5   Time 15                     PT Long Term Goals - 03/19/15 1004    PT LONG TERM GOAL #1   Title Ind with advanced HEP.   Time 4   Period Weeks   Status New   PT LONG TERM GOAL #2   Title No flare-ups of back pain reported.   Time 4   Status New               Plan - 03/21/15 0820    Clinical Impression Statement Pt did fairly well with Rx today. He was able  to perform core and ROM exs with minimal increase in LBP. Traction was also tolerated well @ 120#s.   Pt will benefit from skilled therapeutic intervention in order to improve on the following deficits Pain;Decreased activity tolerance;Decreased range of motion   Rehab Potential Excellent   PT Frequency 3x / week   PT Duration 4 weeks   PT Next Visit Plan Back extension and abdominal machine; advanced core exercises/abdominal strengthneing.  Elliptical.  Int. Traction at 120# (max at 125#).   Consulted and Agree with Plan of Care Patient        Problem List Patient Active Problem List   Diagnosis Date Noted  . Hyperlipidemia LDL goal <100 04/12/2014  . Polycythemia vera ([place]) 04/19/2013  . Family history of early CAD 02/19/2013  . Iron deficiency anemia 09/01/2012  . H/O vitamin D deficiency 05/21/2012  . Diverticulosis of colon 05/21/2012  . Carotid artery stenosis, asymptomatic 05/21/2012  . BPH (benign prostatic hyperplasia)   . Cardiomegaly   . Thrombocytopenia ([place])   . Lap Nissen Fundoplication August 0000000 11/26/2011  . Weight gain, abnormal 08/27/2009  . ESSENTIAL HYPERTENSION, BENIGN 08/27/2009  . OSA (obstructive sleep apnea) 02/19/2008  . COPD, mild ([place]) 05/26/2007    [name],Connor, PTA 03/21/2015, 9:47 AM  [facility] [address] [place], [place], [address] Phone: [phone]   Fax:  [phone]  Name: Connor [name] MRN: [id number] Date of Birth: 03/11/1946

## 2015-03-24 ENCOUNTER — Encounter: Payer: Self-pay | Admitting: Physical Therapy

## 2015-03-24 ENCOUNTER — Ambulatory Visit: Payer: Medicare Other | Admitting: Physical Therapy

## 2015-03-24 DIAGNOSIS — M545 Low back pain, unspecified: Secondary | ICD-10-CM

## 2015-03-24 NOTE — Therapy (Signed)
[facility] [place], [place], [address] Phone: [phone]   Fax:  [phone]  Physical Therapy Treatment  Patient Details  Name: Connor [name] MRN: [id number] Date of Birth: 05/22/1945 Referring Provider: Susa Day MD.  Encounter Date: 03/24/2015      PT End of Session - 03/24/15 1614    Visit Number 3   Number of Visits 12   Date for PT Re-Evaluation 04/30/15   PT Start Time 1601   PT Stop Time 1649   PT Time Calculation (min) 48 min   Activity Tolerance Patient tolerated treatment well   Behavior During Therapy [facility] for tasks assessed/performed      Past Medical History  Diagnosis Date  . Hyperlipemia   . Nephrolithiasis   . Carotid artery stenosis   . Cough 05/26/2007  . COPD, mild ([place]) 05/26/2007    PFT 09/09/11>>FEV1 3.06 (95%), FEV1% 69, FEF 25-75% 1.70 (59%), TLC 7.67 (109%), DLCO 108%, no BD    . GERD (gastroesophageal reflux disease)   . Shortness of breath     with exertion   . Sleep apnea     Cpap-   . 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 Erectile dysfunction   . Peyronie's disease   . Anemia   . BPH (benign prostatic hyperplasia)   . Cardiomegaly   . H/O vitamin D deficiency   . Thrombocytopenia ([place])   . Iron deficiency anemia, unspecified 09/01/2012  . Polycythemia vera(238.4) 04/19/2013  . Hx of echocardiogram 02/15/2012    This showed mild concentric LVH. EF > 55%. H edid have grade 1 diastolic dysfunction with mitral valve E:A ratio of 0.81, his left atrium was mildly dilated by volume assessment at 33.3 mL/m2. He did have mild tricupid regurgitation with upper normal RV systolic pressure at [id number]. He had aortic valve sclerosis without stenosis.  [name] History of nuclear stress test     this revealed normal myocardial perfusion and function. Post stress EF was 57%. There was no region of scar or ischemia.    Past Surgical History  Procedure Laterality Date  .  Appendectomy    . Knee surgery      right  . Shoulder surgery      left  . Nose surgery  2013  . Laparoscopic nissen fundoplication  [id number]    Procedure: LAPAROSCOPIC NISSEN FUNDOPLICATION;  Surgeon: Pedro Earls, MD;  [facility];  Service: General;  Laterality: N/A;  . Hiatal hernia repair  11/24/2011    Procedure: LAPAROSCOPIC REPAIR OF HIATAL HERNIA;  Surgeon: Pedro Earls, MD;  Location: [facility];  Service: General;;    There were no vitals filed for this visit.  Visit Diagnosis:  Midline low back pain without sciatica      Subjective Assessment - 03/24/15 1602    Subjective States that low back has been more uncomfortable than painful. Reports minimal discomfort after sitting at dining table filling out Christmas cards.   Patient is accompained by: Family member  Wife   Patient Stated Goals Perform ADL's without pain.   Currently in Pain? Yes   Pain Score 1    Pain Location Back   Pain Orientation Lower   Pain Descriptors / Indicators Discomfort   Pain Type Chronic pain   Pain Onset More than a month ago            [facility] PT Assessment - 03/24/15 0001  Assessment   Medical Diagnosis Displacement, lumbar disc without myelopathy.                     [facility] Adult PT Treatment/Exercise - 03/24/15 0001    Lumbar Exercises: Aerobic   Elliptical L5,R5 x   5 mins   Lumbar Exercises: Standing   Other Standing Lumbar Exercises EIS x 10   Lumbar Exercises: Supine   Bridge 20 reps   Lumbar Exercises: Prone   Straight Leg Raise 20 reps;2 seconds;Other (comment)  BLE   Other Prone Lumbar Exercises Prone pressups 2x10 reps   Lumbar Exercises: Quadruped   Straight Leg Raise 20 reps   Modalities   Modalities Traction   Traction   Type of Traction Lumbar   Min (lbs) 5   Max (lbs) 120   Hold Time 99   Rest Time 5   Time 15                     PT Long Term Goals - 03/19/15 1004    PT LONG TERM GOAL #1   Title Ind with  advanced HEP.   Time 4   Period Weeks   Status New   PT LONG TERM GOAL #2   Title No flare-ups of back pain reported.   Time 4   Status New               Plan - 03/24/15 1636    Clinical Impression Statement Patient tolerated today's treatment well with no complaints of lumbar pain with any exercises. Experienced Connor fatigue following elliptical as well as calf cramping following quadruped exercise which patient correlated to his medication. Has been completing exercises given to him by Dr. Tonita Cong in a book per patient report. Normal traction response noted following end of traction session at 120# max. Denied lumbar discomfort following today's treatment.   Pt will benefit from skilled therapeutic intervention in order to improve on the following deficits Pain;Decreased activity tolerance;Decreased range of motion   Rehab Potential Excellent   PT Frequency 3x / week   PT Duration 4 weeks   PT Treatment/Interventions Electrical Stimulation;Moist Heat;Ultrasound;Traction;Therapeutic activities;Therapeutic exercise   PT Next Visit Plan Back extension and abdominal machine; advanced core exercises/abdominal strengthneing.  Elliptical.  Int. Traction at 120# (max at 125#).   Consulted and Agree with Plan of Care Patient        Problem List Patient Active Problem List   Diagnosis Date Noted  . Hyperlipidemia LDL goal <100 04/12/2014  . Polycythemia vera ([place]) 04/19/2013  . Family history of early CAD 02/19/2013  . Iron deficiency anemia 09/01/2012  . H/O vitamin D deficiency 05/21/2012  . Diverticulosis of colon 05/21/2012  . Carotid artery stenosis, asymptomatic 05/21/2012  . BPH (benign prostatic hyperplasia)   . Cardiomegaly   . Thrombocytopenia ([place])   . Lap Nissen Fundoplication August 0000000 11/26/2011  . Weight gain, abnormal 08/27/2009  . ESSENTIAL HYPERTENSION, BENIGN 08/27/2009  . OSA (obstructive sleep apnea) 02/19/2008  . COPD, mild ([place]) 05/26/2007    Wynelle Fanny, PTA 03/24/2015, 4:55 PM  [facility] [address] [place], [place], [address] Phone: [phone]   Fax:  [phone]  Name: Connor [name] MRN: [id number] Date of Birth: February 09, 1946

## 2015-03-26 ENCOUNTER — Ambulatory Visit (INDEPENDENT_AMBULATORY_CARE_PROVIDER_SITE_OTHER): Payer: Medicare Other

## 2015-03-26 ENCOUNTER — Encounter: Payer: Self-pay | Admitting: Hematology & Oncology

## 2015-03-26 ENCOUNTER — Ambulatory Visit (HOSPITAL_BASED_OUTPATIENT_CLINIC_OR_DEPARTMENT_OTHER): Payer: Medicare Other | Admitting: Hematology & Oncology

## 2015-03-26 ENCOUNTER — Other Ambulatory Visit (HOSPITAL_BASED_OUTPATIENT_CLINIC_OR_DEPARTMENT_OTHER): Payer: Medicare Other

## 2015-03-26 ENCOUNTER — Ambulatory Visit: Payer: Medicare Other

## 2015-03-26 VITALS — BP 165/73 | HR 80 | Temp 97.8°F | Resp 16 | Ht 71.0 in | Wt 238.0 lb

## 2015-03-26 DIAGNOSIS — D45 Polycythemia vera: Secondary | ICD-10-CM

## 2015-03-26 DIAGNOSIS — Z23 Encounter for immunization: Secondary | ICD-10-CM | POA: Diagnosis not present

## 2015-03-26 DIAGNOSIS — D696 Thrombocytopenia, unspecified: Secondary | ICD-10-CM

## 2015-03-26 DIAGNOSIS — D509 Iron deficiency anemia, unspecified: Secondary | ICD-10-CM

## 2015-03-26 LAB — CBC WITH DIFFERENTIAL (CANCER CENTER ONLY)
BASO#: 0 10*3/uL (ref 0.0–0.2)
BASO%: 0.4 % (ref 0.0–2.0)
EOS ABS: 0.1 10*3/uL (ref 0.0–0.5)
EOS%: 1.9 % (ref 0.0–7.0)
HEMATOCRIT: 39.5 % (ref 38.7–49.9)
HEMOGLOBIN: 12 g/dL — AB (ref 13.0–17.1)
LYMPH#: 1.2 10*3/uL (ref 0.9–3.3)
LYMPH%: 24.8 % (ref 14.0–48.0)
MCH: 23.8 pg — AB (ref 28.0–33.4)
MCHC: 30.4 g/dL — AB (ref 32.0–35.9)
MCV: 78 fL — AB (ref 82–98)
MONO#: 0.5 10*3/uL (ref 0.1–0.9)
MONO%: 9.7 % (ref 0.0–13.0)
NEUT%: 63.2 % (ref 40.0–80.0)
NEUTROS ABS: 3 10*3/uL (ref 1.5–6.5)
Platelets: 140 10*3/uL — ABNORMAL LOW (ref 145–400)
RBC: 5.05 10*6/uL (ref 4.20–5.70)
RDW: 14.9 % (ref 11.1–15.7)
WBC: 4.7 10*3/uL (ref 4.0–10.0)

## 2015-03-26 LAB — IRON AND TIBC
%SAT: 9 % — ABNORMAL LOW (ref 20–55)
IRON: 37 ug/dL — AB (ref 42–163)
TIBC: 399 ug/dL (ref 202–409)
UIBC: 362 ug/dL (ref 117–376)

## 2015-03-26 LAB — CHCC SATELLITE - SMEAR

## 2015-03-26 LAB — FERRITIN: FERRITIN: 5 ng/mL — AB (ref 22–316)

## 2015-03-26 NOTE — Progress Notes (Signed)
No phlebotomy today per m.d order HCT 39

## 2015-03-26 NOTE — Progress Notes (Signed)
Hematology and Oncology Follow Up Visit  Connor [name] [id number] [birth year] 69 y.o. 03/26/2015   Principle Diagnosis:   Polycythemia vera-Triple negative  Chronic immune thrombocytopenia vs medication  Current Therapy:    Phlebotomy to maintain hematocrit below 45%  Aspirin 81 mg by mouth daily     Interim History:  Mr.  Connor [name] is back for followup. He feels pretty well. He's  stillhaving some problems with itching. Again this is when he takes a shower.  I have most of the Singulair. This works on occasion.   Otherwise, he's had no other issues. His wife is still recovering from her exploratory laparotomy for a perforated diverticulum. She has a temporary colostomy This was reversed about 5 weeks ago. She is quite happy that it was reversed..    had a very nice Thanksgiving.  He is trying to stay active. He has been playing some golf.  He's had no cough. He's had no wheezing. He's had no nausea or vomiting.   Overall, his performance status is ECOG 1.   Medications:  Current outpatient prescriptions:  .  alfuzosin (UROXATRAL) 10 MG 24 hr tablet, Take 10 mg by mouth every morning. , Disp: , Rfl:  .  aspirin 81 MG tablet, Take 81 mg by mouth 2 (two) times daily. , Disp: , Rfl:  .  celecoxib (CELEBREX) 200 MG capsule, Take 1 capsule by mouth daily., Disp: , Rfl:  .  Cholecalciferol (VITAMIN D-3) 5000 UNITS TABS, Take 1 tablet by mouth daily. , Disp: , Rfl:  .  Coenzyme Q10 (CO Q 10 PO), Take 1 capsule by mouth daily. , Disp: , Rfl:  .  cyclobenzaprine (FLEXERIL) 10 MG tablet, Take 1 tablet (10 mg total) by mouth 3 (three) times daily as needed for muscle spasms., Disp: 30 tablet, Rfl: 1 .  fish oil-omega-3 fatty acids 1000 MG capsule, Take 2 g by mouth daily. , Disp: , Rfl:  .  Glucosamine-Chondroit-Vit C-Mn (GLUCOSAMINE CHONDR 1500 COMPLX) CAPS, Take by mouth every morning., Disp: , Rfl:  .  Ipratropium-Albuterol (COMBIVENT) 20-100 MCG/ACT AERS respimat, Inhale 1 puff into  the lungs as needed for wheezing or shortness of breath., Disp: 1 Inhaler, Rfl: 11 .  losartan (COZAAR) 50 MG tablet, TAKE 1 TABLET (50 MG TOTAL) BY MOUTH DAILY., Disp: 90 tablet, Rfl: 0 .  montelukast (SINGULAIR) 10 MG tablet, TAKE 1 TABLET AT BEDTIME, Disp: 30 tablet, Rfl: 4 .  Multiple Vitamins-Minerals (CENTRUM SILVER ULTRA MENS) TABS, Take 1 tablet by mouth every morning. Once a day, Disp: , Rfl:  .  polyethylene glycol (MIRALAX / GLYCOLAX) packet, Take 17 g by mouth as needed. , Disp: , Rfl:  .  Probiotic Product (PHILLIPS COLON HEALTH PO), Take 1 capsule by mouth daily. , Disp: , Rfl:  .  pyridOXINE (VITAMIN B-6) 100 MG tablet, Take 100 mg by mouth daily. , Disp: , Rfl:  .  rosuvastatin (CRESTOR) 10 MG tablet, Take 1 tablet (10 mg total) by mouth daily., Disp: 90 tablet, Rfl: 3 .  Turmeric 500 MG CAPS, Take by mouth every morning., Disp: , Rfl:  .  vitamin B-12 (CYANOCOBALAMIN) 500 MCG tablet, Take 500 mcg by mouth daily., Disp: , Rfl:   Allergies:  Allergies  Allergen Reactions  . Dexlansoprazole Diarrhea and Nausea Only  . Dexlansoprazole Diarrhea and Nausea Only    unknown    Past Medical History, Surgical history, Social history, and Family History were reviewed and updated.  Review of Systems: As above  Physical Exam:  height is 5\' 11"  (1.803 m) and weight is 238 lb (107.956 kg). His oral temperature is 97.8 F (36.6 C). His blood pressure is 165/73 and his pulse is 80. His respiration is 16.   Well-developed and well-nourished white gentleman. Head and neck exam shows no ocular or oral lesions.  He has no palpable cervical or supraclavicular lymph nodes. Lungs are clear. Cardiac exam regular rate and rhythm with no murmurs rubs or bruits. Abdomen is soft.  He has good bowel sounds. There is no fluid wave. There is no palpable liver or spleen tip. Extremities shows no clubbing cyanosis or edema. Strength is good bilaterally. Skin exam shows no rashes ecchymoses or petechia.  Neurological exam is nonfocal.  Lab Results  Component Value Date   WBC 4.7 03/26/2015   HGB 12.0* 03/26/2015   HCT 39.5 03/26/2015   MCV 78* 03/26/2015   PLT 140* 03/26/2015     Chemistry      Component Value Date/Time   NA 142 12/18/2014 1007   NA 143 10/02/2014 1007   NA 144 06/19/2014 1047   K 4.4 12/18/2014 1007   K 4.5 06/19/2014 1047   CL 104 12/18/2014 1007   CL 104 06/19/2014 1047   CO2 26 12/18/2014 1007   CO2 29 06/19/2014 1047   BUN 24 12/18/2014 1007   BUN 29* 10/02/2014 1007   BUN 22 06/19/2014 1047   CREATININE 1.07 12/18/2014 1007   CREATININE 1.1 06/19/2014 1047      Component Value Date/Time   CALCIUM 9.1 12/18/2014 1007   CALCIUM 9.5 06/19/2014 1047   ALKPHOS 65 12/18/2014 1007   ALKPHOS 61 06/19/2014 1047   AST 16 12/18/2014 1007   AST 34 06/19/2014 1047   ALT 12 12/18/2014 1007   ALT 20 06/19/2014 1047   BILITOT 0.6 12/18/2014 1007   BILITOT 0.6 10/02/2014 1007   BILITOT 0.90 06/19/2014 1047         Impression and Plan: [name] is a 69 year old gentleman with polycythemia. His blood count looks great today. His hematocrit is still below 45% so we do not have to phlebotomize him..  We will plan to get him back in another 2 months or so. [name]  Volanda Napoleon, MD 12/14/201612:00 PM

## 2015-03-28 ENCOUNTER — Ambulatory Visit: Payer: Medicare Other | Admitting: Physical Therapy

## 2015-03-28 ENCOUNTER — Encounter: Payer: Self-pay | Admitting: Physical Therapy

## 2015-03-28 VITALS — BP 140/80 | HR 90

## 2015-03-28 DIAGNOSIS — M545 Low back pain, unspecified: Secondary | ICD-10-CM

## 2015-03-28 NOTE — Therapy (Signed)
[facility] [place], [place], [address] Phone: [phone]   Fax:  [phone]  Physical Therapy Treatment  Patient Details  Name: Connor [name] MRN: [id number] Date of Birth: 17-Jan-1946 Referring Provider: Susa Day MD.  Encounter Date: 03/28/2015      PT End of Session - 03/28/15 0907    Visit Number 4   Number of Visits 12   Date for PT Re-Evaluation 04/30/15   PT Start Time 0901   PT Stop Time 0953   PT Time Calculation (min) 52 min   Activity Tolerance Patient tolerated treatment well   Behavior During Therapy [facility] for tasks assessed/performed      Past Medical History  Diagnosis Date  . Hyperlipemia   . Nephrolithiasis   . Carotid artery stenosis   . Cough 05/26/2007  . COPD, mild ([place]) 05/26/2007    PFT 09/09/11>>FEV1 3.06 (95%), FEV1% 69, FEF 25-75% 1.70 (59%), TLC 7.67 (109%), DLCO 108%, no BD    . GERD (gastroesophageal reflux disease)   . Shortness of breath     with exertion   . Sleep apnea     Cpap-   . 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 Erectile dysfunction   . Peyronie's disease   . Anemia   . BPH (benign prostatic hyperplasia)   . Cardiomegaly   . H/O vitamin D deficiency   . Thrombocytopenia ([place])   . Iron deficiency anemia, unspecified 09/01/2012  . Polycythemia vera(238.4) 04/19/2013  . Hx of echocardiogram 02/15/2012    This showed mild concentric LVH. EF > 55%. H edid have grade 1 diastolic dysfunction with mitral valve E:A ratio of 0.81, his left atrium was mildly dilated by volume assessment at 33.3 mL/m2. He did have mild tricupid regurgitation with upper normal RV systolic pressure at [id number]. He had aortic valve sclerosis without stenosis.  [name] History of nuclear stress test     this revealed normal myocardial perfusion and function. Post stress EF was 57%. There was no region of scar or ischemia.    Past Surgical History  Procedure Laterality Date  .  Appendectomy    . Knee surgery      right  . Shoulder surgery      left  . Nose surgery  2013  . Laparoscopic nissen fundoplication  [id number]    Procedure: LAPAROSCOPIC NISSEN FUNDOPLICATION;  Surgeon: Pedro Earls, MD;  Location: [facility];  Service: General;  Laterality: N/A;  . Hiatal hernia repair  11/24/2011    Procedure: LAPAROSCOPIC REPAIR OF HIATAL HERNIA;  Surgeon: Pedro Earls, MD;  Location: [facility];  Service: General;;    Filed Vitals:   03/28/15 0937  BP: 140/80  Pulse: 90    Visit Diagnosis:  Midline low back pain without sciatica      Subjective Assessment - 03/28/15 1215    Subjective No pain today.    Patient Stated Goals Perform ADL's without pain.                         [facility] Adult PT Treatment/Exercise - 03/28/15 0001    Lumbar Exercises: Aerobic   Elliptical L5,R5 x   7 mins   Lumbar Exercises: Supine   Bridge 20 reps  with alternating leg extension   Lumbar Exercises: Quadruped   Straight Leg Raise 10 reps  each leg   Opposite Arm/Leg Raise 10 reps  each  leg   Plank 4 x max hold elbows/toes   Traction   Type of Traction Lumbar   Min (lbs) 5   Max (lbs) 120   Hold Time 99   Rest Time 5   Time 15                     PT Long Term Goals - 03/28/15 0939    PT LONG TERM GOAL #1   Title Ind with advanced HEP.   Time 4   Period Weeks   Status On-going   PT LONG TERM GOAL #2   Title No flare-ups of back pain reported.   Time 4   Period Weeks   Status On-going               Plan - 03/28/15 [id number]    Clinical Impression Statement Patient did very well with exercise progression. He continued to have sonme cramping during bridging, but managed with self stretching. Patient reports that last two pulls of traction he felt 4/5 pain in back, but back to 0/10 when pull released and at end of session.   PT Next Visit Plan Assess response to traction. Decrease pull as necessary. Back extension and abdominal  machine; advanced core exercises/abdominal strengthneing.  Elliptical.  Int. Traction at 120# (max at 125#).        Problem List Patient Active Problem List   Diagnosis Date Noted  . Hyperlipidemia LDL goal <100 04/12/2014  . Polycythemia vera ([place]) 04/19/2013  . Family history of early CAD 02/19/2013  . Iron deficiency anemia 09/01/2012  . H/O vitamin D deficiency 05/21/2012  . Diverticulosis of colon 05/21/2012  . Carotid artery stenosis, asymptomatic 05/21/2012  . BPH (benign prostatic hyperplasia)   . Cardiomegaly   . Thrombocytopenia ([place])   . Lap Nissen Fundoplication August 0000000 11/26/2011  . Weight gain, abnormal 08/27/2009  . ESSENTIAL HYPERTENSION, BENIGN 08/27/2009  . OSA (obstructive sleep apnea) 02/19/2008  . COPD, mild ([place]) 05/26/2007    [name] PT  03/28/2015, 12:19 PM  [facility] [address] [place], [place], [address] Phone: [phone]   Fax:  [phone]  Name: Connor [name] MRN: [id number] Date of Birth: 1946/01/07

## 2015-03-31 ENCOUNTER — Ambulatory Visit: Payer: Medicare Other | Admitting: Physical Therapy

## 2015-03-31 ENCOUNTER — Encounter: Payer: Self-pay | Admitting: Physical Therapy

## 2015-03-31 DIAGNOSIS — M545 Low back pain, unspecified: Secondary | ICD-10-CM

## 2015-03-31 NOTE — Therapy (Signed)
[facility] [place], [place], [address] Phone: [phone]   Fax:  [phone]  Physical Therapy Treatment  Patient Details  Name: Connor [name] MRN: [id number] Date of Birth: 04/11/1946 Referring Provider: Susa Day MD.  Encounter Date: 03/31/2015      PT End of Session - 03/31/15 1033    Visit Number 5   Number of Visits 12   Date for PT Re-Evaluation 04/30/15   PT Start Time 1032   PT Stop Time 1119   PT Time Calculation (min) 47 min   Activity Tolerance Patient tolerated treatment well   Behavior During Therapy [facility] for tasks assessed/performed      Past Medical History  Diagnosis Date  . Hyperlipemia   . Nephrolithiasis   . Carotid artery stenosis   . Cough 05/26/2007  . COPD, mild ([place]) 05/26/2007    PFT 09/09/11>>FEV1 3.06 (95%), FEV1% 69, FEF 25-75% 1.70 (59%), TLC 7.67 (109%), DLCO 108%, no BD    . GERD (gastroesophageal reflux disease)   . Shortness of breath     with exertion   . Sleep apnea     Cpap-   . 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 Erectile dysfunction   . Peyronie's disease   . Anemia   . BPH (benign prostatic hyperplasia)   . Cardiomegaly   . H/O vitamin D deficiency   . Thrombocytopenia ([place])   . Iron deficiency anemia, unspecified 09/01/2012  . Polycythemia vera(238.4) 04/19/2013  . Hx of echocardiogram 02/15/2012    This showed mild concentric LVH. EF > 55%. H edid have grade 1 diastolic dysfunction with mitral valve E:A ratio of 0.81, his left atrium was mildly dilated by volume assessment at 33.3 mL/m2. He did have mild tricupid regurgitation with upper normal RV systolic pressure at [id number]. He had aortic valve sclerosis without stenosis.  [name] History of nuclear stress test     this revealed normal myocardial perfusion and function. Post stress EF was 57%. There was no region of scar or ischemia.    Past Surgical History  Procedure Laterality Date  .  Appendectomy    . Knee surgery      right  . Shoulder surgery      left  . Nose surgery  2013  . Laparoscopic nissen fundoplication  [id number]    Procedure: LAPAROSCOPIC NISSEN FUNDOPLICATION;  Surgeon: Pedro Earls, MD;  Location: [facility];  Service: General;  Laterality: N/A;  . Hiatal hernia repair  11/24/2011    Procedure: LAPAROSCOPIC REPAIR OF HIATAL HERNIA;  Surgeon: Pedro Earls, MD;  Location: [facility];  Service: General;;    There were no vitals filed for this visit.  Visit Diagnosis:  Midline low back pain without sciatica      Subjective Assessment - 03/31/15 1032    Subjective Reports that his last 2 pulls of traction last week he had 1-2/10 pain but has not had any pain since. Reports uncomfortable sensation in low back this morning but no pain.   Patient Stated Goals Perform ADL's without pain.   Currently in Pain? No/denies  "just uncomfortale but no pain."            [facility] PT Assessment - 03/31/15 0001    Assessment   Medical Diagnosis Displacement, lumbar disc without myelopathy.                     [facility]  Adult PT Treatment/Exercise - 03/31/15 0001    Lumbar Exercises: Aerobic   Elliptical L5, R6 x8 min   Lumbar Exercises: Standing   Other Standing Lumbar Exercises EIS x 10   Lumbar Exercises: Supine   Bridge 20 reps  with alternating LE extension   Lumbar Exercises: Prone   Other Prone Lumbar Exercises Prone pressups 2x10 reps   Lumbar Exercises: Quadruped   Straight Leg Raise 15 reps;Other (comment)  BLE   Opposite Arm/Leg Raise 15 reps;Other (comment)  Bilaterally   Plank 4 x max hold elbows/toes   Modalities   Modalities Traction   Traction   Type of Traction Lumbar   Min (lbs) 5   Max (lbs) 120   Hold Time 99   Rest Time 5   Time 15                     PT Long Term Goals - 03/28/15 0939    PT LONG TERM GOAL #1   Title Ind with advanced HEP.   Time 4   Period Weeks   Status On-going   PT LONG TERM  GOAL #2   Title No flare-ups of back pain reported.   Time 4   Period Weeks   Status On-going               Plan - 03/31/15 1107    Clinical Impression Statement Patient continues to progress well with core strengthening exercises and demonstrates good form with all exercises completed today. Continues to have cramping following bridging and quadruped SLR but was able to diminish with self stretching. Traction was maintained at 120# max today with patient educated to assess if he experiences the intermittant pain during traction pull during this session. Experienced uncomfortable sensation during today's traction to which patient stated he thought was normal with traction but no pain was experienced today. Denied low back pain following today's treatment.   Pt will benefit from skilled therapeutic intervention in order to improve on the following deficits Pain;Decreased activity tolerance;Decreased range of motion   Rehab Potential Excellent   PT Frequency 3x / week   PT Duration 4 weeks   PT Treatment/Interventions Electrical Stimulation;Moist Heat;Ultrasound;Traction;Therapeutic activities;Therapeutic exercise   PT Next Visit Plan Consider intiating core/ back machine strengthening next treatment and continue to assess traction response.   Consulted and Agree with Plan of Care Patient        Problem List Patient Active Problem List   Diagnosis Date Noted  . Hyperlipidemia LDL goal <100 04/12/2014  . Polycythemia vera ([place]) 04/19/2013  . Family history of early CAD 02/19/2013  . Iron deficiency anemia 09/01/2012  . H/O vitamin D deficiency 05/21/2012  . Diverticulosis of colon 05/21/2012  . Carotid artery stenosis, asymptomatic 05/21/2012  . BPH (benign prostatic hyperplasia)   . Cardiomegaly   . Thrombocytopenia ([place])   . Lap Nissen Fundoplication August 0000000 11/26/2011  . Weight gain, abnormal 08/27/2009  . ESSENTIAL HYPERTENSION, BENIGN 08/27/2009  . OSA (obstructive  sleep apnea) 02/19/2008  . COPD, mild ([place]) 05/26/2007    Wynelle Fanny, PTA 03/31/2015, 11:23 AM  [facility] [address] [place], [place], [address] Phone: [phone]   Fax:  [phone]  Name: Connor [name] MRN: [id number] Date of Birth: [birth year]

## 2015-04-03 ENCOUNTER — Ambulatory Visit: Payer: Medicare Other | Admitting: *Deleted

## 2015-04-03 ENCOUNTER — Encounter: Payer: Self-pay | Admitting: *Deleted

## 2015-04-03 DIAGNOSIS — M545 Low back pain, unspecified: Secondary | ICD-10-CM

## 2015-04-03 NOTE — Therapy (Signed)
[facility] [place], [place], [address] Phone: [phone]   Fax:  [phone]  Physical Therapy Treatment  Patient Details  Name: Connor [name] MRN: [id number] Date of Birth: 04-11-1946 Referring Provider: Susa Day MD.  Encounter Date: 04/03/2015      PT End of Session - 04/03/15 0928    Visit Number 6   Number of Visits 12   Date for PT Re-Evaluation 04/30/15   PT Start Time 0901   PT Stop Time 0951   PT Time Calculation (min) 50 min      Past Medical History  Diagnosis Date  . Hyperlipemia   . Nephrolithiasis   . Carotid artery stenosis   . Cough 05/26/2007  . COPD, mild ([place]) 05/26/2007    PFT 09/09/11>>FEV1 3.06 (95%), FEV1% 69, FEF 25-75% 1.70 (59%), TLC 7.67 (109%), DLCO 108%, no BD    . GERD (gastroesophageal reflux disease)   . Shortness of breath     with exertion   . Sleep apnea     Cpap-   . 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 Erectile dysfunction   . Peyronie's disease   . Anemia   . BPH (benign prostatic hyperplasia)   . Cardiomegaly   . H/O vitamin D deficiency   . Thrombocytopenia ([place])   . Iron deficiency anemia, unspecified 09/01/2012  . Polycythemia vera(238.4) 04/19/2013  . Hx of echocardiogram 02/15/2012    This showed mild concentric LVH. EF > 55%. H edid have grade 1 diastolic dysfunction with mitral valve E:A ratio of 0.81, his left atrium was mildly dilated by volume assessment at 33.3 mL/m2. He did have mild tricupid regurgitation with upper normal RV systolic pressure at [id number]. He had aortic valve sclerosis without stenosis.  [name] History of nuclear stress test     this revealed normal myocardial perfusion and function. Post stress EF was 57%. There was no region of scar or ischemia.    Past Surgical History  Procedure Laterality Date  . Appendectomy    . Knee surgery      right  . Shoulder surgery      left  . Nose surgery  2013  . Laparoscopic nissen  fundoplication  [id number]    Procedure: LAPAROSCOPIC NISSEN FUNDOPLICATION;  Surgeon: Pedro Earls, MD;  Location: [facility];  Service: General;  Laterality: N/A;  . Hiatal hernia repair  11/24/2011    Procedure: LAPAROSCOPIC REPAIR OF HIATAL HERNIA;  Surgeon: Pedro Earls, MD;  Location: [facility];  Service: General;;    There were no vitals filed for this visit.  Visit Diagnosis:  Midline low back pain without sciatica      Subjective Assessment - 04/03/15 0913    Subjective Doing good. LBP 2/10. Did a lot of raking and getting up leaves yesterday, but I did wear a back brace   Patient is accompained by: Family member   Patient Stated Goals Perform ADL's without pain.   Currently in Pain? Yes   Pain Score 2    Pain Location Back   Pain Orientation Lower   Pain Descriptors / Indicators Discomfort   Pain Type Chronic pain   Pain Onset More than a month ago                         [facility] Adult PT Treatment/Exercise - 04/03/15 0001    Exercises   Exercises Lumbar;Knee/Hip  Lumbar Exercises: Aerobic   Elliptical L5, R10 x10 min   Lumbar Exercises: Standing   Other Standing Lumbar Exercises EIS x 10   Lumbar Exercises: Supine   Ab Set 5 reps   Bent Knee Raise 20 reps;2 seconds  with Draw-in   Bridge 20 reps    and Bridge with SLR 2x 10   Straight Leg Raise 20 reps;2 seconds  2x10 each leg   Lumbar Exercises: Quadruped   Straight Leg Raise --   Opposite Arm/Leg Raise 15 reps;Other (comment)  Bilaterally   Traction   Type of Traction Lumbar   Min (lbs) 5   Max (lbs) 120   Hold Time 99   Rest Time 5   Time 15                     PT Long Term Goals - 03/28/15 0939    PT LONG TERM GOAL #1   Title Ind with advanced HEP.   Time 4   Period Weeks   Status On-going   PT LONG TERM GOAL #2   Title No flare-ups of back pain reported.   Time 4   Period Weeks   Status On-going               Plan - 04/03/15 [id number]    Clinical  Impression Statement Pt did great with exs for core strengthening and posture. He was most challenged by   Pt will benefit from skilled therapeutic intervention in order to improve on the following deficits Pain;Decreased activity tolerance;Decreased range of motion   Rehab Potential Excellent   PT Frequency 3x / week   PT Duration 4 weeks   PT Treatment/Interventions Electrical Stimulation;Moist Heat;Ultrasound;Traction;Therapeutic activities;Therapeutic exercise   PT Next Visit Plan Consider intiating core/ back machine strengthening next treatment and continue to assess traction response.   Consulted and Agree with Plan of Care Patient        Problem List Patient Active Problem List   Diagnosis Date Noted  . Hyperlipidemia LDL goal <100 04/12/2014  . Polycythemia vera ([place]) 04/19/2013  . Family history of early CAD 02/19/2013  . Iron deficiency anemia 09/01/2012  . H/O vitamin D deficiency 05/21/2012  . Diverticulosis of colon 05/21/2012  . Carotid artery stenosis, asymptomatic 05/21/2012  . BPH (benign prostatic hyperplasia)   . Cardiomegaly   . Thrombocytopenia ([place])   . Lap Nissen Fundoplication August 0000000 11/26/2011  . Weight gain, abnormal 08/27/2009  . ESSENTIAL HYPERTENSION, BENIGN 08/27/2009  . OSA (obstructive sleep apnea) 02/19/2008  . COPD, mild ([place]) 05/26/2007    Tameshia Bonneville,CHRIS, PTA 04/03/2015, 1:19 PM  [facility] [address] [place], [place], [address] Phone: [phone]   Fax:  [phone]  Name: Connor [name] MRN: [id number] Date of Birth: 21-Sep-1945

## 2015-04-08 ENCOUNTER — Encounter: Payer: Self-pay | Admitting: *Deleted

## 2015-04-08 ENCOUNTER — Ambulatory Visit: Payer: Medicare Other | Admitting: *Deleted

## 2015-04-08 DIAGNOSIS — M545 Low back pain, unspecified: Secondary | ICD-10-CM

## 2015-04-08 NOTE — Therapy (Signed)
[facility] [place], [place], [address] Phone: [phone]   Fax:  [phone]  Physical Therapy Treatment  Patient Details  Name: Connor [name] MRN: [id number] Date of Birth: May 11, 1945 Referring Provider: Susa Day MD.  Encounter Date: 04/08/2015      PT End of Session - 04/08/15 1608    Visit Number 7   Number of Visits 12   Date for PT Re-Evaluation 04/30/15   PT Start Time 1600   PT Stop Time 1649   PT Time Calculation (min) 49 min      Past Medical History  Diagnosis Date  . Hyperlipemia   . Nephrolithiasis   . Carotid artery stenosis   . Cough 05/26/2007  . COPD, mild ([place]) 05/26/2007    PFT 09/09/11>>FEV1 3.06 (95%), FEV1% 69, FEF 25-75% 1.70 (59%), TLC 7.67 (109%), DLCO 108%, no BD    . GERD (gastroesophageal reflux disease)   . Shortness of breath     with exertion   . Sleep apnea     Cpap-   . 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 Erectile dysfunction   . Peyronie's disease   . Anemia   . BPH (benign prostatic hyperplasia)   . Cardiomegaly   . H/O vitamin D deficiency   . Thrombocytopenia ([place])   . Iron deficiency anemia, unspecified 09/01/2012  . Polycythemia vera(238.4) 04/19/2013  . Hx of echocardiogram 02/15/2012    This showed mild concentric LVH. EF > 55%. H edid have grade 1 diastolic dysfunction with mitral valve E:A ratio of 0.81, his left atrium was mildly dilated by volume assessment at 33.3 mL/m2. He did have mild tricupid regurgitation with upper normal RV systolic pressure at [id number]. He had aortic valve sclerosis without stenosis.  [name] History of nuclear stress test     this revealed normal myocardial perfusion and function. Post stress EF was 57%. There was no region of scar or ischemia.    Past Surgical History  Procedure Laterality Date  . Appendectomy    . Knee surgery      right  . Shoulder surgery      left  . Nose surgery  2013  . Laparoscopic nissen  fundoplication  [id number]    Procedure: LAPAROSCOPIC NISSEN FUNDOPLICATION;  Surgeon: Pedro Earls, MD;  Location: [facility];  Service: General;  Laterality: N/A;  . Hiatal hernia repair  11/24/2011    Procedure: LAPAROSCOPIC REPAIR OF HIATAL HERNIA;  Surgeon: Pedro Earls, MD;  Location: [facility];  Service: General;;    There were no vitals filed for this visit.  Visit Diagnosis:  Midline low back pain without sciatica      Subjective Assessment - 04/08/15 1606    Subjective LB is sore and tight. Played in the floor with Grandson yesterday   Patient is accompained by: Family member   Patient Stated Goals Perform ADL's without pain.   Currently in Pain? Yes   Pain Score 2    Pain Location Back   Pain Orientation Lower   Pain Descriptors / Indicators Discomfort   Pain Type Chronic pain   Pain Onset More than a month ago   Aggravating Factors  sitting and then getting up                         [facility] Adult PT Treatment/Exercise - 04/08/15 0001    Exercises   Exercises  Lumbar;Knee/Hip   Lumbar Exercises: Aerobic   Elliptical L5, R10 x10 min   Lumbar Exercises: Standing   Other Standing Lumbar Exercises EIS x 10   Lumbar Exercises: Supine   Bent Knee Raise 20 reps;2 seconds  with Draw-in, Low and high march x 20   Bridge 20 reps    and Bridge with SLR 2x 10   Straight Leg Raise 20 reps;2 seconds  2x10 each leg   Lumbar Exercises: Quadruped   Opposite Arm/Leg Raise 15 reps;Other (comment)  Bilaterally   Modalities   Modalities Traction   Traction   Type of Traction Lumbar   Min (lbs) 5   Max (lbs) 120   Hold Time 99   Rest Time 5   Time 15                     PT Long Term Goals - 03/28/15 0939    PT LONG TERM GOAL #1   Title Ind with advanced HEP.   Time 4   Period Weeks   Status On-going   PT LONG TERM GOAL #2   Title No flare-ups of back pain reported.   Time 4   Period Weeks   Status On-going               Plan  - 04/08/15 1608    Clinical Impression Statement Pt did well with Rx today and was able to perform all exs with minimal pain. He did better with quadruped exs and was able to keep pelvis more level today. Pelvic Traction was performed at 120#s again and he did well. Pt continues to progress towards goals and  are on-going    Pt will benefit from skilled therapeutic intervention in order to improve on the following deficits Pain;Decreased activity tolerance;Decreased range of motion   Rehab Potential Excellent   PT Frequency 3x / week   PT Duration 4 weeks   PT Treatment/Interventions Electrical Stimulation;Moist Heat;Ultrasound;Traction;Therapeutic activities;Therapeutic exercise   PT Next Visit Plan Consider intiating core/ back machine strengthening next treatment and continue to assess traction response.   Consulted and Agree with Plan of Care Patient        Problem List Patient Active Problem List   Diagnosis Date Noted  . Hyperlipidemia LDL goal <100 04/12/2014  . Polycythemia vera ([place]) 04/19/2013  . Family history of early CAD 02/19/2013  . Iron deficiency anemia 09/01/2012  . H/O vitamin D deficiency 05/21/2012  . Diverticulosis of colon 05/21/2012  . Carotid artery stenosis, asymptomatic 05/21/2012  . BPH (benign prostatic hyperplasia)   . Cardiomegaly   . Thrombocytopenia ([place])   . Lap Nissen Fundoplication August 0000000 11/26/2011  . Weight gain, abnormal 08/27/2009  . ESSENTIAL HYPERTENSION, BENIGN 08/27/2009  . OSA (obstructive sleep apnea) 02/19/2008  . COPD, mild ([place]) 05/26/2007    RAMSEUR,CHRIS, PTA 04/08/2015, 5:03 PM  [facility] [place], [place], [address] Phone: [phone]   Fax:  [phone]  Name: SRIVATSA MASHEK MRN: [id number] Date of Birth: 11/21/1945

## 2015-04-11 ENCOUNTER — Ambulatory Visit: Payer: Medicare Other | Admitting: *Deleted

## 2015-04-11 DIAGNOSIS — M545 Low back pain, unspecified: Secondary | ICD-10-CM

## 2015-04-11 NOTE — Therapy (Signed)
[facility] [place], [place], [address] Phone: [phone]   Fax:  [phone]  Physical Therapy Treatment  Patient Details  Name: Connor [name] MRN: [id number] Date of Birth: [birth year] Referring Provider: Susa Day MD.  Encounter Date: 04/11/2015      PT End of Session - 04/11/15 0830    Visit Number 8   Number of Visits 12   Date for PT Re-Evaluation 04/30/15   PT Start Time 0815   PT Stop Time 0902   PT Time Calculation (min) 47 min      Past Medical History  Diagnosis Date  . Hyperlipemia   . Nephrolithiasis   . Carotid artery stenosis   . Cough 05/26/2007  . COPD, mild ([place]) 05/26/2007    PFT 09/09/11>>FEV1 3.06 (95%), FEV1% 69, FEF 25-75% 1.70 (59%), TLC 7.67 (109%), DLCO 108%, no BD    . GERD (gastroesophageal reflux disease)   . Shortness of breath     with exertion   . Sleep apnea     Cpap-   . 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 Erectile dysfunction   . Peyronie's disease   . Anemia   . BPH (benign prostatic hyperplasia)   . Cardiomegaly   . H/O vitamin D deficiency   . Thrombocytopenia ([place])   . Iron deficiency anemia, unspecified 09/01/2012  . Polycythemia vera(238.4) 04/19/2013  . Hx of echocardiogram 02/15/2012    This showed mild concentric LVH. EF > 55%. H edid have grade 1 diastolic dysfunction with mitral valve E:A ratio of 0.81, his left atrium was mildly dilated by volume assessment at 33.3 mL/m2. He did have mild tricupid regurgitation with upper normal RV systolic pressure at [id number]. He had aortic valve sclerosis without stenosis.  [name] History of nuclear stress test     this revealed normal myocardial perfusion and function. Post stress EF was 57%. There was no region of scar or ischemia.    Past Surgical History  Procedure Laterality Date  . Appendectomy    . Knee surgery      right  . Shoulder surgery      left  . Nose surgery  2013  . Laparoscopic nissen  fundoplication  [id number]    Procedure: LAPAROSCOPIC NISSEN FUNDOPLICATION;  Surgeon: Pedro Earls, MD;  [facility]: [facility];  Service: General;  Laterality: N/A;  . Hiatal hernia repair  11/24/2011    Procedure: LAPAROSCOPIC REPAIR OF HIATAL HERNIA;  Surgeon: Pedro Earls, MD;  Location: [facility];  Service: General;;    There were no vitals filed for this visit.  Visit Diagnosis:  Midline low back pain without sciatica      Subjective Assessment - 04/11/15 0828    Subjective LB is doing good this AM   Patient Stated Goals Perform ADL's without pain.   Currently in Pain? Yes   Pain Score 2    Pain Location Back   Pain Orientation Lower   Pain Descriptors / Indicators Discomfort   Pain Type Chronic pain   Pain Onset More than a month ago   Aggravating Factors  ADLs   Pain Relieving Factors Traction and exs                         [facility] Adult PT Treatment/Exercise - 04/11/15 0001    Exercises   Exercises Lumbar;Knee/Hip   Lumbar Exercises: Stretches   Active Hamstring  Stretch --  SLR x 20 active HS stretching Both LEs   Lumbar Exercises: Aerobic   Elliptical L5, R10 x12 min   Lumbar Exercises: Standing   Other Standing Lumbar Exercises EIS x 10   Lumbar Exercises: Supine   Ab Set 5 reps  DRAWin   Bent Knee Raise 20 reps;2 seconds  with Draw-in, Low and high march x 20   Straight Leg Raise 20 reps;2 seconds  2x10 each leg   Lumbar Exercises: Quadruped   Opposite Arm/Leg Raise 15 reps;Other (comment)  Bilaterally   Modalities   Modalities Traction   Traction   Type of Traction Lumbar   Min (lbs) 5   Max (lbs) 120   Hold Time 99   Rest Time 5   Time 15                     PT Long Term Goals - 04/11/15 [id number]    PT LONG TERM GOAL #1   Title Ind with advanced HEP.   Time 4   Period Weeks   Status On-going   PT LONG TERM GOAL #2   Title No flare-ups of back pain reported.   Period Weeks   Status On-going   PT LONG TERM GOAL #3    Title Sit 30 minutes with pain not > 3/10.   Period Weeks   Status Achieved               Plan - 04/11/15 0831    Clinical Impression Statement Pt did great again today with Rx. He was able to perform exsfor core and LB without pain increase. He also did well with 120#s of traction. He was unable to meet ADL goal due to pain and is ongoing   Pt will benefit from skilled therapeutic intervention in order to improve on the following deficits Pain;Decreased activity tolerance;Decreased range of motion   Rehab Potential Excellent   PT Frequency 3x / week   PT Duration 4 weeks   PT Next Visit Plan  core/ back machine strengthening and continue to assess traction response.   Consulted and Agree with Plan of Care Patient        Problem List Patient Active Problem List   Diagnosis Date Noted  . Hyperlipidemia LDL goal <100 04/12/2014  . Polycythemia vera ([place]) 04/19/2013  . Family history of early CAD 02/19/2013  . Iron deficiency anemia 09/01/2012  . H/O vitamin D deficiency 05/21/2012  . Diverticulosis of colon 05/21/2012  . Carotid artery stenosis, asymptomatic 05/21/2012  . BPH (benign prostatic hyperplasia)   . Cardiomegaly   . Thrombocytopenia ([place])   . Lap Nissen Fundoplication August 0000000 11/26/2011  . Weight gain, abnormal 08/27/2009  . ESSENTIAL HYPERTENSION, BENIGN 08/27/2009  . OSA (obstructive sleep apnea) 02/19/2008  . COPD, mild ([place]) 05/26/2007    Bernetta Sutley,CHRIS, PTA 04/11/2015, 9:38 AM  [facility] [address] [place], [place], [address] Phone: [phone]   Fax:  [phone]  Name: Connor [name] MRN: [id number] Date of Birth: 04-29-1945

## 2015-04-15 ENCOUNTER — Ambulatory Visit: Payer: Medicare Other | Attending: Specialist | Admitting: *Deleted

## 2015-04-15 ENCOUNTER — Encounter: Payer: Self-pay | Admitting: *Deleted

## 2015-04-15 DIAGNOSIS — M545 Low back pain, unspecified: Secondary | ICD-10-CM

## 2015-04-15 NOTE — Therapy (Signed)
[facility] [place], [place], [address] Phone: [phone]   Fax:  [phone]  Physical Therapy Treatment  Patient Details  Name: Connor [name] MRN: [id number] Date of Birth: 1945/05/17 Referring Provider: Susa Day MD.  Encounter Date: 04/15/2015      PT End of Session - 04/15/15 1733    Visit Number 9   Number of Visits 12   Date for PT Re-Evaluation 04/30/15   PT Start Time [id number]   PT Stop Time [id number]   PT Time Calculation (min) 49 min      Past Medical History  Diagnosis Date  . Hyperlipemia   . Nephrolithiasis   . Carotid artery stenosis   . Cough 05/26/2007  . COPD, mild ([place]) 05/26/2007    PFT 09/09/11>>FEV1 3.06 (95%), FEV1% 69, FEF 25-75% 1.70 (59%), TLC 7.67 (109%), DLCO 108%, no BD    . GERD (gastroesophageal reflux disease)   . Shortness of breath     with exertion   . Sleep apnea     Cpap-   . 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 Erectile dysfunction   . Peyronie's disease   . Anemia   . BPH (benign prostatic hyperplasia)   . Cardiomegaly   . H/O vitamin D deficiency   . Thrombocytopenia ([place])   . Iron deficiency anemia, unspecified 09/01/2012  . Polycythemia vera(238.4) 04/19/2013  . Hx of echocardiogram 02/15/2012    This showed mild concentric LVH. EF > 55%. H edid have grade 1 diastolic dysfunction with mitral valve E:A ratio of 0.81, his left atrium was mildly dilated by volume assessment at 33.3 mL/m2. He did have mild tricupid regurgitation with upper normal RV systolic pressure at [id number]. He had aortic valve sclerosis without stenosis.  [name] History of nuclear stress test     this revealed normal myocardial perfusion and function. Post stress EF was 57%. There was no region of scar or ischemia.    Past Surgical History  Procedure Laterality Date  . Appendectomy    . Knee surgery      right  . Shoulder surgery      left  . Nose surgery  2013  . Laparoscopic nissen  fundoplication  [id number]    Procedure: LAPAROSCOPIC NISSEN FUNDOPLICATION;  Surgeon: Pedro Earls, MD;  Location: [facility];  Service: General;  Laterality: N/A;  . Hiatal hernia repair  11/24/2011    Procedure: LAPAROSCOPIC REPAIR OF HIATAL HERNIA;  Surgeon: Pedro Earls, MD;  Location: [facility];  Service: General;;    There were no vitals filed for this visit.  Visit Diagnosis:  Midline low back pain without sciatica      Subjective Assessment - 04/15/15 1727    Subjective LB is doing good this AM. Rxs are helping   Patient is accompained by: Family member   Patient Stated Goals Perform ADL's without pain.   Currently in Pain? Yes   Pain Score 2    Pain Location Back   Pain Orientation Lower   Pain Descriptors / Indicators Discomfort   Pain Type Chronic pain   Pain Onset More than a month ago   Aggravating Factors  ADLs   Pain Relieving Factors Traction and Exs                         [facility] Adult PT Treatment/Exercise - 04/15/15 0001    Exercises  Exercises Lumbar;Knee/Hip   Lumbar Exercises: Aerobic   Elliptical L5, R10 x12 min   Lumbar Exercises: Supine   Ab Set 5 reps  DRAWin   Bent Knee Raise 20 reps;2 seconds  with Draw-in, Low and high march x 20   Straight Leg Raise 20 reps;2 seconds  2x10 each leg   Lumbar Exercises: Quadruped   Opposite Arm/Leg Raise 15 reps;Other (comment)  Bilaterally   Traction   Type of Traction Lumbar   Min (lbs) 5   Max (lbs) 120   Hold Time 99   Rest Time 5   Time 15                     PT Long Term Goals - 04/11/15 [id number]    PT LONG TERM GOAL #1   Title Ind with advanced HEP.   Time 4   Period Weeks   Status On-going   PT LONG TERM GOAL #2   Title No flare-ups of back pain reported.   Period Weeks   Status On-going   PT LONG TERM GOAL #3   Title Sit 30 minutes with pain not > 3/10.   Period Weeks   Status Achieved               Plan - 04/15/15 1737    Clinical Impression  Statement Pt did great again today and was able to perform core exs and do pelvic Traction again at 120#s. He feels Rxs are really helping and continues to progress toward LTG for ADLs   Pt will benefit from skilled therapeutic intervention in order to improve on the following deficits Pain;Decreased activity tolerance;Decreased range of motion   Rehab Potential Excellent   PT Frequency 3x / week   PT Duration 4 weeks   PT Treatment/Interventions Electrical Stimulation;Moist Heat;Ultrasound;Traction;Therapeutic activities;Therapeutic exercise   PT Next Visit Plan  core/ back machine strengthening and continue to assess traction response.   Consulted and Agree with Plan of Care Patient        Problem List Patient Active Problem List   Diagnosis Date Noted  . Hyperlipidemia LDL goal <100 04/12/2014  . Polycythemia vera ([place]) 04/19/2013  . Family history of early CAD 02/19/2013  . Iron deficiency anemia 09/01/2012  . H/O vitamin D deficiency 05/21/2012  . Diverticulosis of colon 05/21/2012  . Carotid artery stenosis, asymptomatic 05/21/2012  . BPH (benign prostatic hyperplasia)   . Cardiomegaly   . Thrombocytopenia ([place])   . Lap Nissen Fundoplication August 0000000 11/26/2011  . Weight gain, abnormal 08/27/2009  . ESSENTIAL HYPERTENSION, BENIGN 08/27/2009  . OSA (obstructive sleep apnea) 02/19/2008  . COPD, mild ([place]) 05/26/2007    Shaul Trautman,CHRIS, PTA 04/15/2015, 5:42 PM  [facility] [address] [place], [place], [address] Phone: [phone]   Fax:  [phone]  Name: Connor [name] MRN: [id number] Date of Birth: 10/06/1945

## 2015-04-17 ENCOUNTER — Encounter: Payer: Self-pay | Admitting: Physical Therapy

## 2015-04-17 ENCOUNTER — Ambulatory Visit: Payer: Medicare Other | Admitting: Physical Therapy

## 2015-04-17 DIAGNOSIS — M545 Low back pain, unspecified: Secondary | ICD-10-CM

## 2015-04-17 NOTE — Therapy (Signed)
[facility] [place], [place], [address] Phone: [phone]   Fax:  [phone]  Physical Therapy Treatment  Patient Details  Name: Connor [name] MRN: [id number] Date of Birth: [birth year] Referring Provider: Susa Day MD.  Encounter Date: 04/17/2015      PT End of Session - 04/17/15 0951    Visit Number 10   Number of Visits 12   Date for PT Re-Evaluation 04/30/15   PT Start Time 0948   PT Stop Time 1035   PT Time Calculation (min) 47 min   Activity Tolerance Patient tolerated treatment well   Behavior During Therapy [facility] for tasks assessed/performed      Past Medical History  Diagnosis Date  . Hyperlipemia   . Nephrolithiasis   . Carotid artery stenosis   . Cough 05/26/2007  . COPD, mild ([place]) 05/26/2007    PFT 09/09/11>>FEV1 3.06 (95%), FEV1% 69, FEF 25-75% 1.70 (59%), TLC 7.67 (109%), DLCO 108%, no BD    . GERD (gastroesophageal reflux disease)   . Shortness of breath     with exertion   . Sleep apnea     Cpap-   . 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 Erectile dysfunction   . Peyronie's disease   . Anemia   . BPH (benign prostatic hyperplasia)   . Cardiomegaly   . H/O vitamin D deficiency   . Thrombocytopenia ([place])   . Iron deficiency anemia, unspecified 09/01/2012  . Polycythemia vera(238.4) 04/19/2013  . Hx of echocardiogram 02/15/2012    This showed mild concentric LVH. EF > 55%. H edid have grade 1 diastolic dysfunction with mitral valve E:A ratio of 0.81, his left atrium was mildly dilated by volume assessment at 33.3 mL/m2. He did have mild tricupid regurgitation with upper normal RV systolic pressure at [id number]. He had aortic valve sclerosis without stenosis.  [name] History of nuclear stress test     this revealed normal myocardial perfusion and function. Post stress EF was 57%. There was no region of scar or ischemia.    Past Surgical History  Procedure Laterality Date  .  Appendectomy    . Knee surgery      right  . Shoulder surgery      left  . Nose surgery  2013  . Laparoscopic nissen fundoplication  [id number]    Procedure: LAPAROSCOPIC NISSEN FUNDOPLICATION;  Surgeon: Pedro Earls, MD;  Location: [facility];  Service: General;  Laterality: N/A;  . Hiatal hernia repair  11/24/2011    Procedure: LAPAROSCOPIC REPAIR OF HIATAL HERNIA;  Surgeon: Pedro Earls, MD;  Location: [facility];  Service: General;;    There were no vitals filed for this visit.  Visit Diagnosis:  Midline low back pain without sciatica      Subjective Assessment - 04/17/15 0948    Subjective Reports that following previous treatment he experienced tightness after 10-15 minutes of leaving clinic. Reports that he played golf yesterday and stated that his back felt good during and after 9 holes.   Patient Stated Goals Perform ADL's without pain.   Currently in Pain? Yes   Pain Score 2    Pain Location Back   Pain Orientation Lower   Pain Descriptors / Indicators Tightness   Pain Type Chronic pain   Pain Onset More than a month ago            [facility] PT Assessment - 04/17/15 0001  Assessment   Medical Diagnosis Displacement, lumbar disc without myelopathy.                     [facility] Adult PT Treatment/Exercise - 04/17/15 0001    Lumbar Exercises: Stretches   Double Knee to Chest Stretch 3 reps;30 seconds   Lumbar Exercises: Aerobic   Elliptical L5, R10 x12 min   Lumbar Exercises: Supine   Bent Knee Raise 20 reps;2 seconds  high and low marching x20 reps each   Straight Leg Raise 20 reps;2 seconds  Bilaterally   Lumbar Exercises: Quadruped   Opposite Arm/Leg Raise 20 reps  bilaterally   Modalities   Modalities Traction   Traction   Type of Traction Lumbar   Min (lbs) 5   Max (lbs) 120   Hold Time 99   Rest Time 5   Time 15                     PT Long Term Goals - 04/11/15 [id number]    PT LONG TERM GOAL #1   Title Ind with advanced HEP.    Time 4   Period Weeks   Status On-going   PT LONG TERM GOAL #2   Title No flare-ups of back pain reported.   Period Weeks   Status On-going   PT LONG TERM GOAL #3   Title Sit 30 minutes with pain not > 3/10.   Period Weeks   Status Achieved               Plan - 04/17/15 1024    Clinical Impression Statement Patient tolerated today's treatment well without complaint of increased pain with any of the exercises completed. Patient tolerated low back stretch well without any adverse response. Traction completed again today at 120# and reported experiencing uncomfortable sensation during the last minutes of traction.     Pt will benefit from skilled therapeutic intervention in order to improve on the following deficits Pain;Decreased activity tolerance;Decreased range of motion   Rehab Potential Excellent   PT Frequency 3x / week   PT Duration 4 weeks   PT Treatment/Interventions Electrical Stimulation;Moist Heat;Ultrasound;Traction;Therapeutic activities;Therapeutic exercise   PT Next Visit Plan  core/ back machine strengthening and continue to assess traction response.   Consulted and Agree with Plan of Care Patient        Problem List Patient Active Problem List   Diagnosis Date Noted  . Hyperlipidemia LDL goal <100 04/12/2014  . Polycythemia vera ([place]) 04/19/2013  . Family history of early CAD 02/19/2013  . Iron deficiency anemia 09/01/2012  . H/O vitamin D deficiency 05/21/2012  . Diverticulosis of colon 05/21/2012  . Carotid artery stenosis, asymptomatic 05/21/2012  . BPH (benign prostatic hyperplasia)   . Cardiomegaly   . Thrombocytopenia ([place])   . Lap Nissen Fundoplication August 0000000 11/26/2011  . Weight gain, abnormal 08/27/2009  . ESSENTIAL HYPERTENSION, BENIGN 08/27/2009  . OSA (obstructive sleep apnea) 02/19/2008  . COPD, mild ([place]) 05/26/2007    Wynelle Fanny, PTA 04/17/2015, 11:08 AM  [facility] [address] [place], [place], [address] Phone: [phone]   Fax:  [phone]  Name: Connor [name] MRN: [id number] Date of Birth: [birth year]

## 2015-04-17 NOTE — Therapy (Signed)
[facility] [place], [place], [address] Phone: [phone]   Fax:  [phone]  Physical Therapy Treatment  Patient Details  Name: Connor [name] MRN: [id number] Date of Birth: 1946/03/09 Referring Provider: Susa Day MD.  Encounter Date: 04/17/2015      PT End of Session - 04/17/15 0951    Visit Number 10   Number of Visits 12   Date for PT Re-Evaluation 04/30/15   PT Start Time 0948   PT Stop Time 1035   PT Time Calculation (min) 47 min   Activity Tolerance Patient tolerated treatment well   Behavior During Therapy [facility] for tasks assessed/performed      Past Medical History  Diagnosis Date  . Hyperlipemia   . Nephrolithiasis   . Carotid artery stenosis   . Cough 05/26/2007  . COPD, mild ([place]) 05/26/2007    PFT 09/09/11>>FEV1 3.06 (95%), FEV1% 69, FEF 25-75% 1.70 (59%), TLC 7.67 (109%), DLCO 108%, no BD    . GERD (gastroesophageal reflux disease)   . Shortness of breath     with exertion   . Sleep apnea     Cpap-   . 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 Erectile dysfunction   . Peyronie's disease   . Anemia   . BPH (benign prostatic hyperplasia)   . Cardiomegaly   . H/O vitamin D deficiency   . Thrombocytopenia ([place])   . Iron deficiency anemia, unspecified 09/01/2012  . Polycythemia vera(238.4) 04/19/2013  . Hx of echocardiogram 02/15/2012    This showed mild concentric LVH. EF > 55%. H edid have grade 1 diastolic dysfunction with mitral valve E:A ratio of 0.81, his left atrium was mildly dilated by volume assessment at 33.3 mL/m2. He did have mild tricupid regurgitation with upper normal RV systolic pressure at [id number]. He had aortic valve sclerosis without stenosis.  [name] History of nuclear stress test     this revealed normal myocardial perfusion and function. Post stress EF was 57%. There was no region of scar or ischemia.    Past Surgical History  Procedure Laterality Date  .  Appendectomy    . Knee surgery      right  . Shoulder surgery      left  . Nose surgery  2013  . Laparoscopic nissen fundoplication  [id number]    Procedure: LAPAROSCOPIC NISSEN FUNDOPLICATION;  Surgeon: Pedro Earls, MD;  Location: [facility];  Service: General;  Laterality: N/A;  . Hiatal hernia repair  11/24/2011    Procedure: LAPAROSCOPIC REPAIR OF HIATAL HERNIA;  Surgeon: Pedro Earls, MD;  Location: [facility];  Service: General;;    There were no vitals filed for this visit.  Visit Diagnosis:  Midline low back pain without sciatica      Subjective Assessment - 04/17/15 0948    Subjective Reports that following previous treatment he experienced tightness after 10-15 minutes of leaving clinic. Reports that he played golf yesterday and stated that his back felt good during and after 9 holes.   Patient Stated Goals Perform ADL's without pain.   Currently in Pain? Yes   Pain Score 2    Pain Location Back   Pain Orientation Lower   Pain Descriptors / Indicators Tightness   Pain Type Chronic pain   Pain Onset More than a month ago            [facility] PT Assessment - 04/17/15 0001  Assessment   Medical Diagnosis Displacement, lumbar disc without myelopathy.                     [place] Adult PT Treatment/Exercise - May 14, 2015 0001    Lumbar Exercises: Stretches   Double Knee to Chest Stretch 3 reps;30 seconds   Lumbar Exercises: Aerobic   Elliptical L5, R10 x12 min   Lumbar Exercises: Supine   Bent Knee Raise 20 reps;2 seconds  high and low marching x20 reps each   Straight Leg Raise 20 reps;2 seconds  Bilaterally   Lumbar Exercises: Quadruped   Opposite Arm/Leg Raise 20 reps  bilaterally   Modalities   Modalities Traction   Traction   Type of Traction Lumbar   Min (lbs) 5   Max (lbs) 120   Hold Time 99   Rest Time 5   Time 15                     PT Long Term Goals - 04/11/15 [id number]    PT LONG TERM GOAL #1   Title Ind with advanced HEP.    Time 4   Period Weeks   Status On-going   PT LONG TERM GOAL #2   Title No flare-ups of back pain reported.   Period Weeks   Status On-going   PT LONG TERM GOAL #3   Title Sit 30 minutes with pain not > 3/10.   Period Weeks   Status Achieved               Plan - May 14, 2015 1024    Clinical Impression Statement Patient tolerated today's treatment well without complaint of increased pain with any of the exercises completed. Patient tolerated low back stretch well without any adverse response. Traction completed again today at 120# and reported experiencing uncomfortable sensation during the last minutes of traction.     Pt will benefit from skilled therapeutic intervention in order to improve on the following deficits Pain;Decreased activity tolerance;Decreased range of motion   Rehab Potential Excellent   PT Frequency 3x / week   PT Duration 4 weeks   PT Treatment/Interventions Electrical Stimulation;Moist Heat;Ultrasound;Traction;Therapeutic activities;Therapeutic exercise   PT Next Visit Plan  core/ back machine strengthening and continue to assess traction response.   Consulted and Agree with Plan of Care Patient          G-Codes - 05/14/15 1243    Functional Assessment Tool Used FOTO.   Functional Limitation Other PT primary   Mobility: Walking and Moving Around Current Status [phone]) At least 20 percent but less than 40 percent impaired, limited or restricted   Mobility: Walking and Moving Around Goal Status [phone]) At least 1 percent but less than 20 percent impaired, limited or restricted      Problem List Patient Active Problem List   Diagnosis Date Noted  . Hyperlipidemia LDL goal <100 04/12/2014  . Polycythemia vera ([place]) 04/19/2013  . Family history of early CAD 02/19/2013  . Iron deficiency anemia 09/01/2012  . H/O vitamin D deficiency 05/21/2012  . Diverticulosis of colon 05/21/2012  . Carotid artery stenosis, asymptomatic 05/21/2012  . BPH (benign  prostatic hyperplasia)   . Cardiomegaly   . Thrombocytopenia ([place])   . Lap Nissen Fundoplication August 0000000 11/26/2011  . Weight gain, abnormal 08/27/2009  . ESSENTIAL HYPERTENSION, BENIGN 08/27/2009  . OSA (obstructive sleep apnea) 02/19/2008  . COPD, mild ([place]) 05/26/2007   Physical Therapy Progress Note  Dates of Reporting Period: 03/19/15  to 04/17/15  Objective Reports of Subjective Statement: patient pleased with his progress and his FOTO score improved slightly.  Objective Measurements:   Goal Update: As above.  Plan: per current POC.  Reason Skilled Services are Required: Progress with core exercises and traction.   Hero Mccathern, [place]  MPT 04/17/2015, 12:43 PM  [facility] [address] [place], [place], [address] Phone: [phone]   Fax:  [phone]  Name: Connor [name] MRN: [id number] Date of Birth: 22-Jan-1946

## 2015-04-18 ENCOUNTER — Ambulatory Visit (INDEPENDENT_AMBULATORY_CARE_PROVIDER_SITE_OTHER): Payer: Medicare Other | Admitting: Cardiovascular Disease

## 2015-04-18 VITALS — BP 112/74 | HR 75 | Ht 71.5 in | Wt 236.8 lb

## 2015-04-18 DIAGNOSIS — Z8249 Family history of ischemic heart disease and other diseases of the circulatory system: Secondary | ICD-10-CM | POA: Diagnosis not present

## 2015-04-18 DIAGNOSIS — E785 Hyperlipidemia, unspecified: Secondary | ICD-10-CM

## 2015-04-18 DIAGNOSIS — I10 Essential (primary) hypertension: Secondary | ICD-10-CM | POA: Diagnosis not present

## 2015-04-18 DIAGNOSIS — D509 Iron deficiency anemia, unspecified: Secondary | ICD-10-CM

## 2015-04-18 DIAGNOSIS — G4733 Obstructive sleep apnea (adult) (pediatric): Secondary | ICD-10-CM

## 2015-04-18 NOTE — Patient Instructions (Signed)
Your physician wants you to follow-up in: 1 year or sooner if needed. You will receive a reminder letter in the mail two months in advance. If you don't receive a letter, please call our office to schedule the follow-up appointment.   If you need a refill on your cardiac medications before your next appointment, please call your pharmacy.   

## 2015-04-19 ENCOUNTER — Encounter: Payer: Self-pay | Admitting: Cardiovascular Disease

## 2015-04-19 NOTE — Progress Notes (Signed)
Patient ID: Connor [name], male   DOB: 12/23/1945, 70 y.o.   MRN: [id number]    HPI: Connor [name] is a 70 y.o. male who presents to the office for one year cardiology evaluation.   Connor [name] has a history of significant hyperlipidemia and remotely had been on combination therapy with Niaspan for some time and had taken Zetia as well as Crestor.  He has a strong family history for coronary artery disease and 2 of his younger brothers have had previous  coronary artery stenting. He  has a history of GERD and is status post laparoscopic Nissen fundoplication by [name] Done due to hiatal hernia severe GERD. An echo Doppler study in November 2013 showed an ejection fraction > 55% with grade 1 diastolic dysfunction. He had mild left atrial enlargement and evidence for mild aortic valve sclerosis. He underwent carotid duplex imaging which was normal. A nuclear perfusion study showed normal perfusion and function.  When I saw him 2 years ago he had microcytic indices and was found to be iron deficient. He also subsequently developed thrombocytopenia and has been under the care of [name].  He has been diagnosed with polycythemia vera-JAK2 negative and chronic immune thrombocytopenia.  He apparently did receive IV iron therapy, and underwent ultrasound of the spleen as well as bone marrow. He also has undergone colonoscopy.  He sees Dr. Lutricia Feil on a three-month basis and apparently his hematologic status has been stable.  From a cardiac standpoint, he denies chest pain. He is unaware of palpitations. There is no presyncope or syncope.  He remains active but has noticed some mild shortness of breath with activity.  He also is on CPAP therapy for obstructive sleep apnea.  A follow-up echo Doppler study on 02/11/2014 showed an ejection fraction at 50-55% without regional wall motion abnormalities, but with continued grade 1 diastolic dysfunction.  The left atrium was mildly dilated.    Over the past year, he  denies chest pain, PND orthopnea. He is now on Crestor 10 mg N omega-3 fatty acids 2 g daily for his hyperlipidemia.  He is on losartan 50 mg daily for hypertension.  He typically gives blood every 2-3 months at the [facility] for his polycythemia.  He takes Celebrex for arthritic symptoms. He presents for evaluation  Past Medical History  Diagnosis Date  . Hyperlipemia   . Nephrolithiasis   . Carotid artery stenosis   . Cough 05/26/2007  . COPD, mild ([place]) 05/26/2007    PFT 09/09/11>>FEV1 3.06 (95%), FEV1% 69, FEF 25-75% 1.70 (59%), TLC 7.67 (109%), DLCO 108%, no BD    . GERD (gastroesophageal reflux disease)   . Shortness of breath     with exertion   . Sleep apnea     Cpap-   . 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 Erectile dysfunction   . Peyronie's disease   . Anemia   . BPH (benign prostatic hyperplasia)   . Cardiomegaly   . H/O vitamin D deficiency   . Thrombocytopenia ([place])   . Iron deficiency anemia, unspecified 09/01/2012  . Polycythemia vera(238.4) 04/19/2013  . Hx of echocardiogram 02/15/2012    This showed mild concentric LVH. EF > 55%. H edid have grade 1 diastolic dysfunction with mitral valve E:A ratio of 0.81, his left atrium was mildly dilated by volume assessment at 33.3 mL/m2. He did have mild tricupid regurgitation with upper normal RV systolic pressure at 78GN. He  had aortic valve sclerosis without stenosis.  [name] History of nuclear stress test     this revealed normal myocardial perfusion and function. Post stress EF was 57%. There was no region of scar or ischemia.    Past Surgical History  Procedure Laterality Date  . Appendectomy    . Knee surgery      right  . Shoulder surgery      left  . Nose surgery  2013  . Laparoscopic nissen fundoplication  0/25/8527    Procedure: LAPAROSCOPIC NISSEN FUNDOPLICATION;  Surgeon: Pedro Earls, MD;  Location: [facility];  Service: General;  Laterality: N/A;  . Hiatal hernia repair  11/24/2011     Procedure: LAPAROSCOPIC REPAIR OF HIATAL HERNIA;  Surgeon: Pedro Earls, MD;  Location: [facility];  Service: General;;    Allergies  Allergen Reactions  . Dexlansoprazole Diarrhea and Nausea Only  . Dexlansoprazole Diarrhea and Nausea Only    unknown    Current Outpatient Prescriptions  Medication Sig Dispense Refill  . alfuzosin (UROXATRAL) 10 MG 24 hr tablet Take 10 mg by mouth every morning.     [name] aspirin 81 MG tablet Take 81 mg by mouth 2 (two) times daily.     . celecoxib (CELEBREX) 200 MG capsule Take 1 capsule by mouth daily.    . Cholecalciferol (VITAMIN D-3) 5000 UNITS TABS Take 1 tablet by mouth daily.     . Coenzyme Q10 (CO Q 10 PO) Take 1 capsule by mouth daily.     . cyclobenzaprine (FLEXERIL) 10 MG tablet Take 1 tablet (10 mg total) by mouth 3 (three) times daily as needed for muscle spasms. 30 tablet 1  . fish oil-omega-3 fatty acids 1000 MG capsule Take 2 g by mouth daily.     . Glucosamine-Chondroit-Vit C-Mn (GLUCOSAMINE CHONDR 1500 COMPLX) CAPS Take by mouth every morning.    . Ipratropium-Albuterol (COMBIVENT) 20-100 MCG/ACT AERS respimat Inhale 1 puff into the lungs as needed for wheezing or shortness of breath. 1 Inhaler 11  . losartan (COZAAR) 50 MG tablet TAKE 1 TABLET (50 MG TOTAL) BY MOUTH DAILY. 90 tablet 0  . montelukast (SINGULAIR) 10 MG tablet TAKE 1 TABLET AT BEDTIME 30 tablet 4  . Multiple Vitamins-Minerals (CENTRUM SILVER ULTRA MENS) TABS Take 1 tablet by mouth every morning. Once a day    . polyethylene glycol (MIRALAX / GLYCOLAX) packet Take 17 g by mouth as needed.     . Probiotic Product (PHILLIPS COLON HEALTH PO) Take 1 capsule by mouth daily.     [name] pyridOXINE (VITAMIN B-6) 100 MG tablet Take 100 mg by mouth daily.     . rosuvastatin (CRESTOR) 10 MG tablet Take 1 tablet (10 mg total) by mouth daily. 90 tablet 3  . Turmeric 500 MG CAPS Take by mouth every morning.    . vitamin B-12 (CYANOCOBALAMIN) 500 MCG tablet Take 500 mcg by mouth daily.     No  current facility-administered medications for this visit.    Social History   Social History  . Marital Status: Married    Spouse Name: N/A  . Number of Children: 3  . Years of Education: N/A   Occupational History  . retired    Social History Main Topics  . Smoking status: Former Smoker -- 0.25 packs/day for 11 years    Types: Cigarettes    Start date: 05/17/1958    Quit date: 04/12/1966  . Smokeless tobacco: Never Used     Comment: quit smoking in  1968  . Alcohol Use: 0.0 oz/week    0 Standard drinks or equivalent per week     Comment: seldom  . Drug Use: No  . Sexual Activity: Not on file   Other Topics Concern  . Not on file   Social History Narrative    Family History  Problem Relation Age of Onset  . Heart disease Mother   . Diabetes Mother   . Heart disease Father   . Heart disease Sister   . Heart disease Brother   . Diabetes Brother   . Colon cancer Neg Hx   . Esophageal cancer Neg Hx   . Rectal cancer Neg Hx   . Stomach cancer Neg Hx   . Liver cancer Paternal Grandfather   . Throat cancer Maternal Grandfather    Social history is notable in that he is married has 3 children 5 grandchildren and 3 stepgrandchildren. He is retired. No tobacco history. He does drink rare wine. He exercises several days per week for 30-45 minutes.    ROS General: Negative; No fevers, chills, or night sweats;  HEENT: Positive for IIb.  Eyelids No changes in vision or hearing, sinus congestion, difficulty swallowing Pulmonary: Negative; No cough, wheezing, shortness of breath, hemoptysis Cardiovascular: Negative; No chest pain, presyncope, syncope, palpitations GI: Positive for GERD; No nausea, vomiting, diarrhea, or abdominal pain GU: Negative; No dysuria, hematuria, or difficulty voiding Musculoskeletal: Negative; no myalgias, joint pain, or weakness Hematologic/Oncology: Positive for polycythemia vera for which he undergoes phlebotomy to keep his hematocrit less than  45; chronic immune thrombocytopenia; no easy bruising, bleeding Endocrine: Negative; no heat/cold intolerance; no diabetes Neuro: Negative; no changes in balance, headaches Skin: Negative; No rashes or skin lesions Psychiatric: Negative; No behavioral problems, depression Sleep: Positive for obstructive sleep apnea on CPAP therapy No snoring, daytime sleepiness, hypersomnolence, bruxism, restless legs, hypnogognic hallucinations, no cataplexy Other comprehensive 14 point system review is negative.   PE BP 112/74 mmHg  Pulse 75  Ht 5' 11.5" (1.816 m)  Wt 236 lb 12.8 oz (107.412 kg)  BMI 32.57 kg/m2   Wt Readings from Last 3 Encounters:  04/18/15 236 lb 12.8 oz (107.412 kg)  03/26/15 238 lb (107.956 kg)  12/30/14 237 lb (107.502 kg)   General: Alert, oriented, no distress.  Skin: normal turgor, no rashes HEENT: Normocephalic, atraumatic. Pupils round and reactive; sclera anicteric; mildly droopy eyelids Nose without nasal septal hypertrophy Mouth/Parynx benign; Mallinpatti scale 2/3 Neck: No JVD, no carotid bruits Lungs: clear to ausculatation and percussion; no wheezing or rales Chest wall: Nontender to palpation Heart: RRR, s1 s2 normal 1/6 systolic murmur.  No diastolic murmur.  No rubs, thrills or heaves Abdomen: soft, nontender; no hepatosplenomehaly, BS+; abdominal aorta nontender and not dilated by palpation. Back: No CVA tenderness Pulses 2+ Extremities: Trivial ankle edema ,no clubbing cyanosis, Homan's sign negative  Neurologic: grossly nonfocal Psychologic: normal affect and mood.  ECG (independently read by me):  Normal sinus rhythm at 75 bpm.  Small Q wave in lead 3.  No ST segment changes.  Normal intervals.  December 2015 ECG (independently read by me): Normal sinus rhythm at 86 bpm.  Q wave in lead 3.  No ST segment changes.  October 2015 ECG (independently read by me): Normal sinus rhythm at 77 bpm.  No ectopy.  Normal intervals.  November 2014 ECG: Sinus  rhythm at 77 beats per minute. No change.  LABS: BMP Latest Ref Rng 12/18/2014 10/02/2014 07/31/2014  Glucose 65 - 99 mg/dL  105(H) 99 162(H)  BUN 8 - 27 mg/dL 24 29(H) 19  Creatinine 0.76 - 1.27 mg/dL 1.07 1.08 1.01  BUN/Creat Ratio 10 - 22 22 - -  Sodium 134 - 144 mmol/L 142 143 141  Potassium 3.5 - 5.2 mmol/L 4.4 4.2 4.1  Chloride 97 - 108 mmol/L 104 108 108  CO2 18 - 29 mmol/L 26 21 24   Calcium 8.6 - 10.2 mg/dL 9.1 9.7 8.9   Hepatic Function Latest Ref Rng 12/18/2014 10/02/2014 07/31/2014  Total Protein 6.0 - 8.5 g/dL 6.0 6.3 6.3  Albumin 3.5 - 4.8 g/dL 4.1 4.2 4.0  AST 0 - 40 IU/L 16 18 23   ALT 0 - 44 IU/L 12 14 18   Alk Phosphatase 39 - 117 IU/L 65 54 52  Total Bilirubin 0.0 - 1.2 mg/dL 0.6 0.6 0.6  Bilirubin, Direct 0.00 - 0.40 mg/dL 0.14 - -   CBC Latest Ref Rng 03/26/2015 12/20/2014 12/18/2014  WBC 4.0 - 10.0 10e3/uL 4.7 4.8 3.8  Hemoglobin 13.0 - 17.1 g/dL 12.0(L) 12.5(L) -  Hematocrit 38.7 - 49.9 % 39.5 40.6 40.4  Platelets 145 - 400 10e3/uL 140(L) 114(L) 130(L)   Lab Results  Component Value Date   MCV 78* 03/26/2015   MCV 81* 12/20/2014   MCV 80 12/18/2014   Lab Results  Component Value Date   TSH 1.400 02/11/2014   No results found for: HGBA1C   Lipid Panel     Component Value Date/Time   CHOL 113 12/18/2014 1007   CHOL 150 07/20/2012 0947   TRIG 86 12/18/2014 1007   TRIG 100 02/11/2014 0849   TRIG 128 07/20/2012 0947   HDL 36* 12/18/2014 1007   HDL 33* 02/11/2014 0849   HDL 31* 07/20/2012 0947   CHOLHDL 4.0 02/11/2014 0849   VLDL 20 02/11/2014 0849   LDLCALC 80 02/11/2014 0849   LDLCALC 82 08/27/2013 0852   LDLCALC 93 07/20/2012 0947   RADIOLOGY: No results found.    ASSESSMENT AND PLAN: Mr. Mochizuki is a 70 year old gentleman who has a history of mild hypertension, hyperlipidemia, strong family history for coronary artery disease, as well as polycythemia and chronic immune thrombocytopenia.  His blood pressure today is stable at 112/74.  When taken by  the nurse and on repeat by me was 130/70. He is tolerating Crestor and fish oil for his hyperlipidemia.  He has laboratory checked at Dr. Tawanna Sat office.  He is not having any symptoms suggestive of angina. His ECG remained stable. He has had issues with low HDL level which remotely was a reason for his niacin use.  He has documented normal LV systolic function with mild LVH and grade 1 diastolic dysfunction.[name]  His body mass index is 32.57 with places him in the mild obese category.  We discussed weight loss, increased exercise.  As long as he remains stable, I will see him in one year for reevaluation.   Time spent: 25 minutes  Troy Sine, MD, [facility]  04/19/2015 2:12 PM

## 2015-04-21 ENCOUNTER — Encounter: Payer: Medicare Other | Admitting: Physical Therapy

## 2015-04-24 ENCOUNTER — Ambulatory Visit: Payer: Medicare Other | Admitting: Physical Therapy

## 2015-04-24 ENCOUNTER — Encounter: Payer: Self-pay | Admitting: Physical Therapy

## 2015-04-24 DIAGNOSIS — M545 Low back pain, unspecified: Secondary | ICD-10-CM

## 2015-04-24 NOTE — Therapy (Signed)
[facility] [place], [place], [address] Phone: [phone]   Fax:  [phone]  Physical Therapy Treatment  Patient Details  Name: JAYK MCCOMMON MRN: [id number] Date of Birth: [birth year] Referring Provider: Susa Day MD.  Encounter Date: 04/24/2015      PT End of Session - 04/24/15 0949    Visit Number 11   Number of Visits 12   Date for PT Re-Evaluation 04/30/15   PT Start Time 0946   PT Stop Time 1042   PT Time Calculation (min) 56 min   Activity Tolerance Patient tolerated treatment well   Behavior During Therapy [facility] for tasks assessed/performed      Past Medical History  Diagnosis Date  . Hyperlipemia   . Nephrolithiasis   . Carotid artery stenosis   . Cough 05/26/2007  . COPD, mild ([place]) 05/26/2007    PFT 09/09/11>>FEV1 3.06 (95%), FEV1% 69, FEF 25-75% 1.70 (59%), TLC 7.67 (109%), DLCO 108%, no BD    . GERD (gastroesophageal reflux disease)   . Shortness of breath     with exertion   . Sleep apnea     Cpap-   . 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 Erectile dysfunction   . Peyronie's disease   . Anemia   . BPH (benign prostatic hyperplasia)   . Cardiomegaly   . H/O vitamin D deficiency   . Thrombocytopenia ([place])   . Iron deficiency anemia, unspecified 09/01/2012  . Polycythemia vera(238.4) 04/19/2013  . Hx of echocardiogram 02/15/2012    This showed mild concentric LVH. EF > 55%. H edid have grade 1 diastolic dysfunction with mitral valve E:A ratio of 0.81, his left atrium was mildly dilated by volume assessment at 33.3 mL/m2. He did have mild tricupid regurgitation with upper normal RV systolic pressure at [id number]. He had aortic valve sclerosis without stenosis.  [name] History of nuclear stress test     this revealed normal myocardial perfusion and function. Post stress EF was 57%. There was no region of scar or ischemia.    Past Surgical History  Procedure Laterality Date  .  Appendectomy    . Knee surgery      right  . Shoulder surgery      left  . Nose surgery  2013  . Laparoscopic nissen fundoplication  [id number]    Procedure: LAPAROSCOPIC NISSEN FUNDOPLICATION;  Surgeon: Pedro Earls, MD;  Location: [facility];  Service: General;  Laterality: N/A;  . Hiatal hernia repair  11/24/2011    Procedure: LAPAROSCOPIC REPAIR OF HIATAL HERNIA;  Surgeon: Pedro Earls, MD;  Location: [facility];  Service: General;;    There were no vitals filed for this visit.  Visit Diagnosis:  Midline low back pain without sciatica      Subjective Assessment - 04/24/15 0949    Subjective Reports no pain since previous treatment.   Patient Stated Goals Perform ADL's without pain.   Currently in Pain? No/denies            [facility] PT Assessment - 04/24/15 0001    Assessment   Medical Diagnosis Displacement, lumbar disc without myelopathy.                     [facility] Adult PT Treatment/Exercise - 04/24/15 0001    Lumbar Exercises: Aerobic   Elliptical R5, L7 x12 min   Lumbar Exercises: Machines for Strengthening   Other Lumbar Machine Exercise  Back extension 50# x30 reps   Lumbar Exercises: Supine   Bent Knee Raise 20 reps;2 seconds  high/ low marching   Straight Leg Raise 20 reps;2 seconds  BLE   Lumbar Exercises: Quadruped   Opposite Arm/Leg Raise 20 reps   Modalities   Modalities Traction   Traction   Type of Traction Lumbar   Min (lbs) 5   Max (lbs) 120   Hold Time 99   Rest Time 5   Time 15                     PT Long Term Goals - 04/24/15 0950    PT LONG TERM GOAL #1   Title Ind with advanced HEP.   Time 4   Period Weeks   Status Achieved  Given exercises by Dr. Tonita Cong   PT LONG TERM GOAL #2   Title No flare-ups of back pain reported.   Period Weeks   Status Achieved   PT LONG TERM GOAL #3   Title Sit 30 minutes with pain not > 3/10.   Period Weeks   Status Achieved               Plan - 04/24/15 1026     Clinical Impression Statement Patient continues to tolerated treatments and exercises well with no reports of pain or discomfort today. Tolerated new back extensor machine strengthening exercise well without pain and may be able to increase resistance next visit secondary to patient report of 50# being fairly easy. Traction maintained at 120# max with patient reporting no pain or discomfort following previous treatment. Has achieved all goals set at evaluation and continues to report compliance with exercises from book given to patient by Dr. Susa Day.    Pt will benefit from skilled therapeutic intervention in order to improve on the following deficits Pain;Decreased activity tolerance;Decreased range of motion   Rehab Potential Excellent   PT Frequency 3x / week   PT Duration 4 weeks   PT Treatment/Interventions Electrical Stimulation;Moist Heat;Ultrasound;Traction;Therapeutic activities;Therapeutic exercise   PT Next Visit Plan  core/ back machine strengthening and continue to assess traction response. D/C next visit.   Consulted and Agree with Plan of Care Patient        Problem List Patient Active Problem List   Diagnosis Date Noted  . Hyperlipidemia LDL goal <100 04/12/2014  . Polycythemia vera ([place]) 04/19/2013  . Family history of early CAD 02/19/2013  . Iron deficiency anemia 09/01/2012  . H/O vitamin D deficiency 05/21/2012  . Diverticulosis of colon 05/21/2012  . Carotid artery stenosis, asymptomatic 05/21/2012  . BPH (benign prostatic hyperplasia)   . Cardiomegaly   . Thrombocytopenia ([place])   . Lap Nissen Fundoplication August 0000000 11/26/2011  . Weight gain, abnormal 08/27/2009  . ESSENTIAL HYPERTENSION, BENIGN 08/27/2009  . OSA (obstructive sleep apnea) 02/19/2008  . COPD, mild ([place]) 05/26/2007    Wynelle Fanny, PTA 04/24/2015, 11:19 AM  [facility] [address] [place], [place], [address] Phone: [phone]   Fax:   [phone]  Name: ZI WATERMAN MRN: [id number] Date of Birth: [birth year]

## 2015-04-28 ENCOUNTER — Ambulatory Visit: Payer: Medicare Other | Admitting: Physical Therapy

## 2015-04-28 DIAGNOSIS — M545 Low back pain, unspecified: Secondary | ICD-10-CM

## 2015-04-28 NOTE — Therapy (Signed)
[facility] [place], [place], [address] Phone: [phone]   Fax:  [phone]  Physical Therapy Treatment  Patient Details  Name: Connor [name] MRN: [id number] Date of Birth: May 20, 1945 Referring Provider: Susa Day MD.  Encounter Date: 04/28/2015      PT End of Session - 04/28/15 0815    Visit Number 12   Number of Visits 12   Date for PT Re-Evaluation 04/30/15   PT Start Time 0814   PT Stop Time 0909   PT Time Calculation (min) 55 min   Activity Tolerance Patient tolerated treatment well   Behavior During Therapy [facility] for tasks assessed/performed      Past Medical History  Diagnosis Date  . Hyperlipemia   . Nephrolithiasis   . Carotid artery stenosis   . Cough 05/26/2007  . COPD, mild ([place]) 05/26/2007    PFT 09/09/11>>FEV1 3.06 (95%), FEV1% 69, FEF 25-75% 1.70 (59%), TLC 7.67 (109%), DLCO 108%, no BD    . GERD (gastroesophageal reflux disease)   . Shortness of breath     with exertion   . Sleep apnea     Cpap-   . 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 Erectile dysfunction   . Peyronie's disease   . Anemia   . BPH (benign prostatic hyperplasia)   . Cardiomegaly   . H/O vitamin D deficiency   . Thrombocytopenia ([place])   . Iron deficiency anemia, unspecified 09/01/2012  . Polycythemia vera(238.4) 04/19/2013  . Hx of echocardiogram 02/15/2012    This showed mild concentric LVH. EF > 55%. H edid have grade 1 diastolic dysfunction with mitral valve E:A ratio of 0.81, his left atrium was mildly dilated by volume assessment at 33.3 mL/m2. He did have mild tricupid regurgitation with upper normal RV systolic pressure at 14ER. He had aortic valve sclerosis without stenosis.  [name] History of nuclear stress test     this revealed normal myocardial perfusion and function. Post stress EF was 57%. There was no region of scar or ischemia.    Past Surgical History  Procedure Laterality Date  .  Appendectomy    . Knee surgery      right  . Shoulder surgery      left  . Nose surgery  2013  . Laparoscopic nissen fundoplication  1/54/0086    Procedure: LAPAROSCOPIC NISSEN FUNDOPLICATION;  Surgeon: Pedro Earls, MD;  Location: [facility];  Service: General;  Laterality: N/A;  . Hiatal hernia repair  11/24/2011    Procedure: LAPAROSCOPIC REPAIR OF HIATAL HERNIA;  Surgeon: Pedro Earls, MD;  Location: [facility];  Service: General;;    There were no vitals filed for this visit.  Visit Diagnosis:  Midline low back pain without sciatica      Subjective Assessment - 04/28/15 0822    Subjective No pain today.   Patient Stated Goals Perform ADL's without pain.   Currently in Pain? No/denies                         [facility] Adult PT Treatment/Exercise - 04/28/15 0001    Lumbar Exercises: Aerobic   Elliptical R5, L7 x12 min   Lumbar Exercises: Machines for Strengthening   Other Lumbar Machine Exercise Back extension 60# x30 reps   Lumbar Exercises: Supine   Bent Knee Raise 20 reps;2 seconds  high/ low marching from table top position   Other  Supine Lumbar Exercises progressed bent knee raise to bicycle and alt leg press for HEP   Other Supine Lumbar Exercises side plank and modified side plank given for progression of HEP   Lumbar Exercises: Quadruped   Opposite Arm/Leg Raise 20 reps;5 seconds   Modalities   Modalities Traction   Traction   Type of Traction Lumbar   Min (lbs) 5   Max (lbs) 120   Hold Time 99   Rest Time 5   Time 15                     PT Long Term Goals - 04/24/15 0950    PT LONG TERM GOAL #1   Title Ind with advanced HEP.   Time 4   Period Weeks   Status Achieved  Given exercises by Dr. Tonita Cong   PT LONG TERM GOAL #2   Title No flare-ups of back pain reported.   Period Weeks   Status Achieved   PT LONG TERM GOAL #3   Title Sit 30 minutes with pain not > 3/10.   Period Weeks   Status Achieved                Plan - 15-May-2015 0924    Clinical Impression Statement Patient did well with his last treatment. He was able to progress current exercises for HEP without increased pain. He has met all goals and is I with HEP.   Pt will benefit from skilled therapeutic intervention in order to improve on the following deficits Pain;Decreased activity tolerance;Decreased range of motion   Rehab Potential Excellent   PT Frequency 3x / week   PT Duration 4 weeks   PT Treatment/Interventions Electrical Stimulation;Moist Heat;Ultrasound;Traction;Therapeutic activities;Therapeutic exercise   PT Next Visit Plan see d/c summary   Consulted and Agree with Plan of Care Patient          G-Codes - 05/15/2015 1194    Functional Assessment Tool Used FOTO. 28% limited (Intake 33% limited)   Functional Limitation Mobility: Walking and moving around   Mobility: Walking and Moving Around Current Status [phone]) At least 20 percent but less than 40 percent impaired, limited or restricted   Mobility: Walking and Moving Around Goal Status [phone]) At least 20 percent but less than 40 percent impaired, limited or restricted   Mobility: Walking and Moving Around Discharge Status [phone]) At least 20 percent but less than 40 percent impaired, limited or restricted      Problem List Patient Active Problem List   Diagnosis Date Noted  . Hyperlipidemia LDL goal <100 04/12/2014  . Polycythemia vera ([place]) 04/19/2013  . Family history of early CAD 02/19/2013  . Iron deficiency anemia 09/01/2012  . H/O vitamin D deficiency 05/21/2012  . Diverticulosis of colon 05/21/2012  . Carotid artery stenosis, asymptomatic 05/21/2012  . BPH (benign prostatic hyperplasia)   . Cardiomegaly   . Thrombocytopenia ([place])   . Lap Nissen Fundoplication August 4970 11/26/2011  . Weight gain, abnormal 08/27/2009  . ESSENTIAL HYPERTENSION, BENIGN 08/27/2009  . OSA (obstructive sleep apnea) 02/19/2008  . COPD, mild ([place]) 05/26/2007   [name]  PT  May 15, 2015, 12:40 PM  [facility] [address] [place], [place], [address] Phone: [phone]   Fax:  [phone]  Name: Connor [name] MRN: [id number] Date of Birth: 05-Aug-1945    PHYSICAL THERAPY DISCHARGE SUMMARY  Visits from Start of Care: 12  Current functional level related to goals / functional outcomes: See  above.   Remaining deficits: See above.   Education / Equipment: HEP  Plan: Patient agrees to discharge.  Patient goals were met. Patient is being discharged due to meeting the stated rehab goals.  ?????       [name], PT 04/28/2015 12:42 PM [facility] [place], [place], [address] Phone: [phone]   Fax:  [phone]

## 2015-04-30 DIAGNOSIS — L814 Other melanin hyperpigmentation: Secondary | ICD-10-CM | POA: Diagnosis not present

## 2015-04-30 DIAGNOSIS — D2272 Melanocytic nevi of left lower limb, including hip: Secondary | ICD-10-CM | POA: Diagnosis not present

## 2015-04-30 DIAGNOSIS — D2372 Other benign neoplasm of skin of left lower limb, including hip: Secondary | ICD-10-CM | POA: Diagnosis not present

## 2015-04-30 DIAGNOSIS — Z85828 Personal history of other malignant neoplasm of skin: Secondary | ICD-10-CM | POA: Diagnosis not present

## 2015-04-30 DIAGNOSIS — D225 Melanocytic nevi of trunk: Secondary | ICD-10-CM | POA: Diagnosis not present

## 2015-04-30 DIAGNOSIS — I788 Other diseases of capillaries: Secondary | ICD-10-CM | POA: Diagnosis not present

## 2015-04-30 DIAGNOSIS — L821 Other seborrheic keratosis: Secondary | ICD-10-CM | POA: Diagnosis not present

## 2015-04-30 DIAGNOSIS — L299 Pruritus, unspecified: Secondary | ICD-10-CM | POA: Diagnosis not present

## 2015-04-30 DIAGNOSIS — L280 Lichen simplex chronicus: Secondary | ICD-10-CM | POA: Diagnosis not present

## 2015-05-19 ENCOUNTER — Ambulatory Visit (INDEPENDENT_AMBULATORY_CARE_PROVIDER_SITE_OTHER): Payer: Medicare Other | Admitting: Family Medicine

## 2015-05-19 ENCOUNTER — Encounter: Payer: Self-pay | Admitting: Family Medicine

## 2015-05-19 VITALS — BP 142/71 | HR 89 | Temp 97.1°F | Ht 71.5 in | Wt 239.6 lb

## 2015-05-19 DIAGNOSIS — M6208 Separation of muscle (nontraumatic), other site: Secondary | ICD-10-CM | POA: Diagnosis not present

## 2015-05-19 NOTE — Progress Notes (Signed)
BP 142/71 mmHg  Pulse 89  Temp(Src) 97.1 F (36.2 C) (Oral)  Ht 5' 11.5" (1.816 m)  Wt 239 lb 9.6 oz (108.682 kg)  BMI 32.96 kg/m2   Subjective:    Patient ID: Connor [name], male    DOB: 1945/07/18, 70 y.o.   MRN: [id number]  HPI: Connor [name] is a 70 y.o. male presenting on 05/19/2015 for Hernia   HPI Abdominal bulge Patient is concerned because his midline abdominal bulge that goes vertically from his umbilicus up to near the bottom of his ribs. He denies any pain or constipation or nausea or vomiting associated with it. He denies any overlying skin changes. He was just concerned that it might be a hernia and wanted to get it checked out. He has had a surgery at their previously (Nissen fundoplication).  Relevant past medical, surgical, family and social history reviewed and updated as indicated. Interim medical history since our last visit reviewed. Allergies and medications reviewed and updated.  Review of Systems  Constitutional: Negative for fever and chills.  HENT: Negative for ear discharge and ear pain.   Eyes: Negative for discharge and visual disturbance.  Respiratory: Negative for shortness of breath and wheezing.   Cardiovascular: Negative for chest pain and leg swelling.  Gastrointestinal: Positive for abdominal distention. Negative for abdominal pain, diarrhea and constipation.  Genitourinary: Negative for difficulty urinating.  Musculoskeletal: Negative for back pain and gait problem.  Skin: Negative for rash.  Neurological: Negative for dizziness, syncope, light-headedness and headaches.  All other systems reviewed and are negative.   Per HPI unless specifically indicated above     Medication List       This list is accurate as of: 05/19/15  9:24 AM.  Always use your most recent med list.               alfuzosin 10 MG 24 hr tablet  Commonly known as:  UROXATRAL  Take 10 mg by mouth every morning.     aspirin 81 MG tablet  Take 81 mg by mouth  2 (two) times daily.     celecoxib 200 MG capsule  Commonly known as:  CELEBREX  Take 1 capsule by mouth daily.     CENTRUM SILVER ULTRA MENS Tabs  Take 1 tablet by mouth every morning. Once a day     CO Q 10 PO  Take 1 capsule by mouth daily.     cyclobenzaprine 10 MG tablet  Commonly known as:  FLEXERIL  Take 1 tablet (10 mg total) by mouth 3 (three) times daily as needed for muscle spasms.     fish oil-omega-3 fatty acids 1000 MG capsule  Take 2 g by mouth daily.     GLUCOSAMINE CHONDR 1500 COMPLX Caps  Take by mouth every morning.     Ipratropium-Albuterol 20-100 MCG/ACT Aers respimat  Commonly known as:  COMBIVENT  Inhale 1 puff into the lungs as needed for wheezing or shortness of breath.     losartan 50 MG tablet  Commonly known as:  COZAAR  TAKE 1 TABLET (50 MG TOTAL) BY MOUTH DAILY.     montelukast 10 MG tablet  Commonly known as:  SINGULAIR  TAKE 1 TABLET AT BEDTIME     PHILLIPS COLON HEALTH PO  Take 1 capsule by mouth daily.     polyethylene glycol packet  Commonly known as:  MIRALAX / GLYCOLAX  Take 17 g by mouth as needed.     pyridOXINE 100 MG  tablet  Commonly known as:  VITAMIN B-6  Take 100 mg by mouth daily.     rosuvastatin 10 MG tablet  Commonly known as:  CRESTOR  Take 1 tablet (10 mg total) by mouth daily.     Turmeric 500 MG Caps  Take by mouth every morning.     vitamin B-12 500 MCG tablet  Commonly known as:  CYANOCOBALAMIN  Take 500 mcg by mouth daily.     Vitamin D-3 5000 UNITS Tabs  Take 1 tablet by mouth daily.           Objective:    BP 142/71 mmHg  Pulse 89  Temp(Src) 97.1 F (36.2 C) (Oral)  Ht 5' 11.5" (1.816 m)  Wt 239 lb 9.6 oz (108.682 kg)  BMI 32.96 kg/m2  Wt Readings from Last 3 Encounters:  05/19/15 239 lb 9.6 oz (108.682 kg)  04/18/15 236 lb 12.8 oz (107.412 kg)  03/26/15 238 lb (107.956 kg)    Physical Exam  Constitutional: He is oriented to person, place, and time. He appears well-developed and  well-nourished. No distress.  Eyes: Conjunctivae and EOM are normal. Pupils are equal, round, and reactive to light. Right eye exhibits no discharge. No scleral icterus.  Cardiovascular: Normal rate, regular rhythm, normal heart sounds and intact distal pulses.   No murmur heard. Pulmonary/Chest: Effort normal and breath sounds normal. No respiratory distress. He has no wheezes.  Abdominal: Soft. Bowel sounds are normal. He exhibits no distension, no pulsatile midline mass and no mass. There is no hepatosplenomegaly. There is no tenderness. There is no rebound, no guarding and no CVA tenderness.  Midline abdominal distention with increased intra-abdominal pressure above the umbilicus consistent with diastasis recti  Musculoskeletal: Normal range of motion. He exhibits no edema.  Neurological: He is alert and oriented to person, place, and time. Coordination normal.  Skin: Skin is warm and dry. No rash noted. He is not diaphoretic.  Psychiatric: He has a normal mood and affect. His behavior is normal.  Nursing note and vitals reviewed.     Assessment & Plan:   Problem List Items Addressed This Visit    None    Visit Diagnoses    Diastasis recti    -  Primary        Follow up plan: Return if symptoms worsen or fail to improve.  Counseling provided for all of the vaccine components No orders of the defined types were placed in this encounter.    Caryl Pina, MD [facility] 05/19/2015, 9:24 AM

## 2015-06-20 ENCOUNTER — Other Ambulatory Visit: Payer: Medicare Other

## 2015-06-20 DIAGNOSIS — E559 Vitamin D deficiency, unspecified: Secondary | ICD-10-CM

## 2015-06-20 DIAGNOSIS — D509 Iron deficiency anemia, unspecified: Secondary | ICD-10-CM

## 2015-06-20 DIAGNOSIS — E785 Hyperlipidemia, unspecified: Secondary | ICD-10-CM

## 2015-06-20 DIAGNOSIS — D696 Thrombocytopenia, unspecified: Secondary | ICD-10-CM | POA: Diagnosis not present

## 2015-06-20 DIAGNOSIS — N400 Enlarged prostate without lower urinary tract symptoms: Secondary | ICD-10-CM | POA: Diagnosis not present

## 2015-06-20 DIAGNOSIS — I10 Essential (primary) hypertension: Secondary | ICD-10-CM | POA: Diagnosis not present

## 2015-06-21 LAB — CBC WITH DIFFERENTIAL/PLATELET
BASOS ABS: 0 10*3/uL (ref 0.0–0.2)
Basos: 0 %
EOS (ABSOLUTE): 0.2 10*3/uL (ref 0.0–0.4)
Eos: 3 %
Hematocrit: 36 % — ABNORMAL LOW (ref 37.5–51.0)
Hemoglobin: 10.9 g/dL — ABNORMAL LOW (ref 12.6–17.7)
IMMATURE GRANULOCYTES: 0 %
Immature Grans (Abs): 0 10*3/uL (ref 0.0–0.1)
LYMPHS ABS: 1 10*3/uL (ref 0.7–3.1)
Lymphs: 20 %
MCH: 22.4 pg — ABNORMAL LOW (ref 26.6–33.0)
MCHC: 30.3 g/dL — AB (ref 31.5–35.7)
MCV: 74 fL — ABNORMAL LOW (ref 79–97)
MONOCYTES: 8 %
MONOS ABS: 0.4 10*3/uL (ref 0.1–0.9)
NEUTROS PCT: 69 %
Neutrophils Absolute: 3.4 10*3/uL (ref 1.4–7.0)
Platelets: 153 10*3/uL (ref 150–379)
RBC: 4.86 x10E6/uL (ref 4.14–5.80)
RDW: 17.4 % — AB (ref 12.3–15.4)
WBC: 5 10*3/uL (ref 3.4–10.8)

## 2015-06-21 LAB — NMR, LIPOPROFILE
Cholesterol: 114 mg/dL (ref 100–199)
HDL CHOLESTEROL BY NMR: 34 mg/dL — AB (ref >39)
HDL PARTICLE NUMBER: 28.3 umol/L — AB (ref >=30.5)
LDL Particle Number: 884 nmol/L (ref <1000)
LDL SIZE: 20.4 nm (ref >20.5)
LDL-C: 67 mg/dL (ref 0–99)
LP-IR Score: 49 — ABNORMAL HIGH (ref <=45)
SMALL LDL PARTICLE NUMBER: 574 nmol/L — AB (ref <=527)
TRIGLYCERIDES BY NMR: 64 mg/dL (ref 0–149)

## 2015-06-21 LAB — HEPATIC FUNCTION PANEL
ALBUMIN: 4.2 g/dL (ref 3.5–4.8)
ALT: 18 IU/L (ref 0–44)
AST: 26 IU/L (ref 0–40)
Alkaline Phosphatase: 59 IU/L (ref 39–117)
BILIRUBIN TOTAL: 0.5 mg/dL (ref 0.0–1.2)
BILIRUBIN, DIRECT: 0.18 mg/dL (ref 0.00–0.40)
Total Protein: 5.8 g/dL — ABNORMAL LOW (ref 6.0–8.5)

## 2015-06-21 LAB — BMP8+EGFR
BUN / CREAT RATIO: 16 (ref 10–22)
BUN: 16 mg/dL (ref 8–27)
CHLORIDE: 104 mmol/L (ref 96–106)
CO2: 23 mmol/L (ref 18–29)
Calcium: 9.2 mg/dL (ref 8.6–10.2)
Creatinine, Ser: 0.98 mg/dL (ref 0.76–1.27)
GFR calc Af Amer: 89 mL/min/{1.73_m2} (ref >59)
GFR calc non Af Amer: 77 mL/min/{1.73_m2} (ref >59)
GLUCOSE: 105 mg/dL — AB (ref 65–99)
Potassium: 4.4 mmol/L (ref 3.5–5.2)
SODIUM: 142 mmol/L (ref 134–144)

## 2015-06-21 LAB — VITAMIN D 25 HYDROXY (VIT D DEFICIENCY, FRACTURES): VIT D 25 HYDROXY: 65.5 ng/mL (ref 30.0–100.0)

## 2015-06-24 ENCOUNTER — Ambulatory Visit (INDEPENDENT_AMBULATORY_CARE_PROVIDER_SITE_OTHER): Payer: Medicare Other | Admitting: Family Medicine

## 2015-06-24 ENCOUNTER — Encounter: Payer: Self-pay | Admitting: Family Medicine

## 2015-06-24 ENCOUNTER — Ambulatory Visit: Payer: Medicare Other | Admitting: Family Medicine

## 2015-06-24 VITALS — BP 157/78 | HR 75 | Temp 96.8°F | Ht 71.5 in | Wt 240.0 lb

## 2015-06-24 DIAGNOSIS — I10 Essential (primary) hypertension: Secondary | ICD-10-CM | POA: Diagnosis not present

## 2015-06-24 DIAGNOSIS — J069 Acute upper respiratory infection, unspecified: Secondary | ICD-10-CM

## 2015-06-24 DIAGNOSIS — D509 Iron deficiency anemia, unspecified: Secondary | ICD-10-CM

## 2015-06-24 MED ORDER — AMOXICILLIN-POT CLAVULANATE 875-125 MG PO TABS: 1.0000 | ORAL_TABLET | Freq: Two times a day (BID) | ORAL | Status: DC

## 2015-06-24 NOTE — Patient Instructions (Addendum)
Take Mucinex regularly twice daily with a large glass of water Keep the house as cool as possible Drink plenty of fluids and stay well hydrated Use nasal saline frequently through the day Take antibiotic twice daily with food until completed Return the FOBT and repeat the CBC in 4 weeks as the patient indicates he donated blood in the past 2-3 weeks.

## 2015-06-24 NOTE — Progress Notes (Signed)
Subjective:    Patient ID: Connor [name], male    DOB: December 16, 1945, 70 y.o.   MRN: [id number]  HPI Patient here today for cough and congestion that started last Friday.The drainage and congestion is green in color. This is been going on now for 4-5 days. He has had cough and headache. His wife also has congestion but not with the symptoms her husband is having. He has not been running any fever. Recent lab work was reviewed with the patient he will take a copy of this when he goes to see the hematologist tomorrow.      Patient Active Problem List   Diagnosis Date Noted  . Hyperlipidemia LDL goal <100 04/12/2014  . Polycythemia vera ([place]) 04/19/2013  . Family history of early CAD 02/19/2013  . Iron deficiency anemia 09/01/2012  . H/O vitamin D deficiency 05/21/2012  . Diverticulosis of colon 05/21/2012  . Carotid artery stenosis, asymptomatic 05/21/2012  . BPH (benign prostatic hyperplasia)   . Cardiomegaly   . Thrombocytopenia ([place])   . Lap Nissen Fundoplication August 0000000 11/26/2011  . Weight gain, abnormal 08/27/2009  . ESSENTIAL HYPERTENSION, BENIGN 08/27/2009  . OSA (obstructive sleep apnea) 02/19/2008  . COPD, mild ([place]) 05/26/2007   Outpatient Encounter Prescriptions as of 06/24/2015  Medication Sig  . alfuzosin (UROXATRAL) 10 MG 24 hr tablet Take 10 mg by mouth every morning.   [name] aspirin 81 MG tablet Take 81 mg by mouth 2 (two) times daily.   . celecoxib (CELEBREX) 200 MG capsule Take 1 capsule by mouth daily.  . Cholecalciferol (VITAMIN D-3) 5000 UNITS TABS Take 1 tablet by mouth daily.   . Coenzyme Q10 (CO Q 10 PO) Take 1 capsule by mouth daily.   . cyclobenzaprine (FLEXERIL) 10 MG tablet Take 1 tablet (10 mg total) by mouth 3 (three) times daily as needed for muscle spasms.  . fish oil-omega-3 fatty acids 1000 MG capsule Take 2 g by mouth daily.   . Glucosamine-Chondroit-Vit C-Mn (GLUCOSAMINE CHONDR 1500 COMPLX) CAPS Take by mouth every morning.  .  Ipratropium-Albuterol (COMBIVENT) 20-100 MCG/ACT AERS respimat Inhale 1 puff into the lungs as needed for wheezing or shortness of breath.  . losartan (COZAAR) 50 MG tablet TAKE 1 TABLET (50 MG TOTAL) BY MOUTH DAILY.  . montelukast (SINGULAIR) 10 MG tablet TAKE 1 TABLET AT BEDTIME  . Multiple Vitamins-Minerals (CENTRUM SILVER ULTRA MENS) TABS Take 1 tablet by mouth every morning. Once a day  . polyethylene glycol (MIRALAX / GLYCOLAX) packet Take 17 g by mouth as needed.   . Probiotic Product (PHILLIPS COLON HEALTH PO) Take 1 capsule by mouth daily.   [name] pyridOXINE (VITAMIN B-6) 100 MG tablet Take 100 mg by mouth daily.   . rosuvastatin (CRESTOR) 10 MG tablet Take 1 tablet (10 mg total) by mouth daily.  . Turmeric 500 MG CAPS Take by mouth every morning.  . vitamin B-12 (CYANOCOBALAMIN) 500 MCG tablet Take 500 mcg by mouth daily.   No facility-administered encounter medications on file as of 06/24/2015.      Review of Systems  Constitutional: Negative.  Negative for fever.  HENT: Positive for congestion (green) and sneezing.   Eyes: Negative.   Respiratory: Positive for cough.   Cardiovascular: Negative.   Gastrointestinal: Negative.   Endocrine: Negative.   Genitourinary: Negative.   Musculoskeletal: Negative.   Skin: Negative.   Allergic/Immunologic: Negative.   Neurological: Positive for headaches.  Hematological: Negative.   Psychiatric/Behavioral: Negative.  Objective:   Physical Exam  Constitutional: He is oriented to person, place, and time. He appears well-developed and well-nourished.  HENT:  Head: Normocephalic and atraumatic.  Right Ear: External ear normal.  Left Ear: External ear normal.  Mouth/Throat: Oropharynx is clear and moist. No oropharyngeal exudate.  Nasal congestion bilaterally with slight redness. No sinus tenderness to palpation  Eyes: Conjunctivae and EOM are normal. Pupils are equal, round, and reactive to light. Right eye exhibits no discharge.  Left eye exhibits no discharge. No scleral icterus.  Neck: Normal range of motion. Neck supple. No tracheal deviation present. No thyromegaly present.  No adenopathy  Cardiovascular: Normal rate, regular rhythm and normal heart sounds.   No murmur heard. Pulmonary/Chest: Effort normal and breath sounds normal. No respiratory distress. He has no wheezes. He has no rales.  Dry cough  Abdominal: He exhibits no mass.  Musculoskeletal: Normal range of motion. He exhibits no edema.  Lymphadenopathy:    He has no cervical adenopathy.  Neurological: He is alert and oriented to person, place, and time.  Skin: Skin is warm and dry. No rash noted.  Psychiatric: He has a normal mood and affect. His behavior is normal. Judgment and thought content normal.  Nursing note and vitals reviewed.  BP 157/78 mmHg  Pulse 75  Temp(Src) 96.8 F (36 C) (Oral)  Ht 5' 11.5" (1.816 m)  Wt 240 lb (108.863 kg)  BMI 33.01 kg/m2        Assessment & Plan:  1. Essential hypertension, benign -The blood pressure is elevated today and the patient will bring home blood pressure readings to office for review in a couple weeks  2. Acute URI -Take Mucinex and use nasal saline and drink plenty of fluids and keep the house as cool as possible - amoxicillin-clavulanate (AUGMENTIN) 875-125 MG tablet; Take 1 tablet by mouth 2 (two) times daily.  Dispense: 20 tablet; Refill: 0  3. Anemia -On further questioning the patient he did get blood in the past 2-3 weeks and this may certainly be the reason for his lower hemoglobin this time. He will be given an FOBT to return and he was encouraged to come back in about 4 weeks and repeat the CBC  Patient Instructions  Take Mucinex regularly twice daily with a large glass of water Keep the house as cool as possible Drink plenty of fluids and stay well hydrated Use nasal saline frequently through the day Take antibiotic twice daily with food until completed   Arrie Senate  MD

## 2015-06-25 ENCOUNTER — Ambulatory Visit: Payer: Medicare Other

## 2015-06-25 ENCOUNTER — Encounter: Payer: Self-pay | Admitting: Hematology & Oncology

## 2015-06-25 ENCOUNTER — Other Ambulatory Visit (HOSPITAL_BASED_OUTPATIENT_CLINIC_OR_DEPARTMENT_OTHER): Payer: Medicare Other

## 2015-06-25 ENCOUNTER — Ambulatory Visit (HOSPITAL_BASED_OUTPATIENT_CLINIC_OR_DEPARTMENT_OTHER): Payer: Medicare Other | Admitting: Hematology & Oncology

## 2015-06-25 VITALS — BP 126/61 | HR 90 | Temp 97.4°F | Resp 18 | Ht 71.0 in | Wt 242.0 lb

## 2015-06-25 DIAGNOSIS — D45 Polycythemia vera: Secondary | ICD-10-CM

## 2015-06-25 DIAGNOSIS — D696 Thrombocytopenia, unspecified: Secondary | ICD-10-CM

## 2015-06-25 DIAGNOSIS — D509 Iron deficiency anemia, unspecified: Secondary | ICD-10-CM

## 2015-06-25 DIAGNOSIS — D751 Secondary polycythemia: Secondary | ICD-10-CM | POA: Diagnosis not present

## 2015-06-25 LAB — FERRITIN: FERRITIN: 10 ng/mL — AB (ref 22–316)

## 2015-06-25 NOTE — Progress Notes (Signed)
Hematology and Oncology Follow Up Visit  Connor [name] [id number] 20-Feb-1946 70 y.o. 06/25/2015   Principle Diagnosis:   Polycythemia vera-Triple negative  Chronic immune thrombocytopenia vs medication  Current Therapy:    Phlebotomy to maintain hematocrit below 45%  Aspirin 81 mg by mouth daily     Interim History:  [name] is back for followup. He feels pretty well. He has donated blood 2 weeks ago. He enjoys doing this.  He's  Still is having some problems with itching. Again this is when he takes a shower. If Connor Benadryl works better than Singulair. I have no problems with him taking Benadryl.  Otherwise, he's had no other issues. Connor [name] is still recovering from her exploratory laparotomy for a perforated diverticulum. She has a temporary colostomy This was reversed about 3 weeks ago. She is quite happy that it was reversed..    So far, he's a well this winter. Because the warm weather, he's been out playing golf.  He actually dissipated in the senior games in the county that he [place] in.  He's had no cough. He's had no wheezing. He's had no nausea or vomiting.   Overall, Connor performance status is ECOG 1.   Medications:  Current outpatient prescriptions:  .  alfuzosin (UROXATRAL) 10 MG 24 hr tablet, Take 10 mg by mouth every morning. , Disp: , Rfl:  .  amoxicillin-clavulanate (AUGMENTIN) 875-125 MG tablet, Take 1 tablet by mouth 2 (two) times daily., Disp: 20 tablet, Rfl: 0 .  aspirin 81 MG tablet, Take 81 mg by mouth 2 (two) times daily. , Disp: , Rfl:  .  celecoxib (CELEBREX) 200 MG capsule, Take 1 capsule by mouth daily., Disp: , Rfl:  .  Cholecalciferol (VITAMIN D-3) 5000 UNITS TABS, Take 1 tablet by mouth daily. , Disp: , Rfl:  .  Coenzyme Q10 (CO Q 10 PO), Take 1 capsule by mouth daily. , Disp: , Rfl:  .  cyclobenzaprine (FLEXERIL) 10 MG tablet, Take 1 tablet (10 mg total) by mouth 3 (three) times daily as needed for muscle spasms., Disp: 30 tablet, Rfl:  1 .  fish oil-omega-3 fatty acids 1000 MG capsule, Take 2 g by mouth daily. , Disp: , Rfl:  .  Glucosamine-Chondroit-Vit C-Mn (GLUCOSAMINE CHONDR 1500 COMPLX) CAPS, Take by mouth every morning., Disp: , Rfl:  .  Ipratropium-Albuterol (COMBIVENT) 20-100 MCG/ACT AERS respimat, Inhale 1 puff into the lungs as needed for wheezing or shortness of breath., Disp: 1 Inhaler, Rfl: 11 .  losartan (COZAAR) 50 MG tablet, TAKE 1 TABLET (50 MG TOTAL) BY MOUTH DAILY., Disp: 90 tablet, Rfl: 0 .  montelukast (SINGULAIR) 10 MG tablet, TAKE 1 TABLET AT BEDTIME, Disp: 30 tablet, Rfl: 4 .  Multiple Vitamins-Minerals (CENTRUM SILVER ULTRA MENS) TABS, Take 1 tablet by mouth every morning. Once a day, Disp: , Rfl:  .  polyethylene glycol (MIRALAX / GLYCOLAX) packet, Take 17 g by mouth as needed. , Disp: , Rfl:  .  Probiotic Product (PHILLIPS COLON HEALTH PO), Take 1 capsule by mouth daily. , Disp: , Rfl:  .  pyridOXINE (VITAMIN B-6) 100 MG tablet, Take 100 mg by mouth daily. , Disp: , Rfl:  .  rosuvastatin (CRESTOR) 10 MG tablet, Take 1 tablet (10 mg total) by mouth daily., Disp: 90 tablet, Rfl: 3 .  Turmeric 500 MG CAPS, Take by mouth every morning., Disp: , Rfl:  .  vitamin B-12 (CYANOCOBALAMIN) 500 MCG tablet, Take 500 mcg by mouth daily., Disp: ,  Rfl:   Allergies:  Allergies  Allergen Reactions  . Dexlansoprazole Diarrhea and Nausea Only  . Dexlansoprazole Diarrhea and Nausea Only    unknown    Past Medical History, Surgical history, Social history, and Family History were reviewed and updated.  Review of Systems: As above  Physical Exam:  height is 5\' 11"  (1.803 m) and weight is 242 lb (109.77 kg). Connor oral temperature is 97.4 F (36.3 C). Connor blood pressure is 126/61 and Connor pulse is 90. Connor respiration is 18.   Well-developed and well-nourished white gentleman. Head and neck exam shows no ocular or oral lesions.  He has no palpable cervical or supraclavicular lymph nodes. Lungs are clear. Cardiac exam  regular rate and rhythm with no murmurs rubs or bruits. Abdomen is soft.  He has good bowel sounds. There is no fluid wave. There is no palpable liver or spleen tip. Extremities shows no clubbing cyanosis or edema. Strength is good bilaterally. Skin exam shows no rashes ecchymoses or petechia. Neurological exam is nonfocal.  Lab Results  Component Value Date   WBC 5.0 06/20/2015   HGB 12.0* 03/26/2015   HCT 36.0* 06/20/2015   MCV 74* 06/20/2015   PLT 153 06/20/2015     Chemistry      Component Value Date/Time   NA 142 06/20/2015 1225   NA 143 10/02/2014 1007   NA 144 06/19/2014 1047   K 4.4 06/20/2015 1225   K 4.5 06/19/2014 1047   CL 104 06/20/2015 1225   CL 104 06/19/2014 1047   CO2 23 06/20/2015 1225   CO2 29 06/19/2014 1047   BUN 16 06/20/2015 1225   BUN 29* 10/02/2014 1007   BUN 22 06/19/2014 1047   CREATININE 0.98 06/20/2015 1225   CREATININE 1.1 06/19/2014 1047      Component Value Date/Time   CALCIUM 9.2 06/20/2015 1225   CALCIUM 9.5 06/19/2014 1047   ALKPHOS 59 06/20/2015 1225   ALKPHOS 61 06/19/2014 1047   AST 26 06/20/2015 1225   AST 34 06/19/2014 1047   ALT 18 06/20/2015 1225   ALT 20 06/19/2014 1047   BILITOT 0.5 06/20/2015 1225   BILITOT 0.6 10/02/2014 1007   BILITOT 0.90 06/19/2014 1047         Impression and Plan: Connor [name] is a 70 year old gentleman with polycythemia. Connor blood count looks great today. Connor hematocrit is still below 45% so we do not have to phlebotomize him.Marland KitchenHe actually donated blood a couple weeks ago. He'll have to be careful with this as Connor iron level certainly will continue to be low.  We will plan to get him back in another 3 months or so. [name]  Volanda Napoleon, MD 3/15/201710:24 AM

## 2015-06-25 NOTE — Progress Notes (Signed)
No phlebotomy needed per parameter orders. Hct < 45. dph

## 2015-06-30 ENCOUNTER — Telehealth: Payer: Self-pay | Admitting: *Deleted

## 2015-06-30 DIAGNOSIS — Z0000 Encounter for general adult medical examination without abnormal findings: Secondary | ICD-10-CM | POA: Diagnosis not present

## 2015-06-30 DIAGNOSIS — N401 Enlarged prostate with lower urinary tract symptoms: Secondary | ICD-10-CM | POA: Diagnosis not present

## 2015-06-30 DIAGNOSIS — R3912 Poor urinary stream: Secondary | ICD-10-CM | POA: Diagnosis not present

## 2015-06-30 NOTE — Telephone Encounter (Signed)
Patient c/o fatigue. Was seen last week and his ferritin was low. Wants to know if he can have 'something' to make it better. Spoke to Dr Marin Olp and he wants patient to have one dose of iv feraheme. Patient aware and appointment made.

## 2015-07-02 ENCOUNTER — Ambulatory Visit (INDEPENDENT_AMBULATORY_CARE_PROVIDER_SITE_OTHER): Payer: Medicare Other | Admitting: Family Medicine

## 2015-07-02 ENCOUNTER — Encounter: Payer: Self-pay | Admitting: Family Medicine

## 2015-07-02 ENCOUNTER — Ambulatory Visit (HOSPITAL_BASED_OUTPATIENT_CLINIC_OR_DEPARTMENT_OTHER): Payer: Medicare Other

## 2015-07-02 VITALS — BP 127/67 | HR 81 | Temp 97.0°F | Ht 71.0 in | Wt 239.0 lb

## 2015-07-02 VITALS — BP 139/54 | HR 74 | Temp 98.0°F | Resp 20

## 2015-07-02 DIAGNOSIS — N400 Enlarged prostate without lower urinary tract symptoms: Secondary | ICD-10-CM

## 2015-07-02 DIAGNOSIS — N5201 Erectile dysfunction due to arterial insufficiency: Secondary | ICD-10-CM | POA: Diagnosis not present

## 2015-07-02 DIAGNOSIS — D696 Thrombocytopenia, unspecified: Secondary | ICD-10-CM

## 2015-07-02 DIAGNOSIS — J449 Chronic obstructive pulmonary disease, unspecified: Secondary | ICD-10-CM

## 2015-07-02 DIAGNOSIS — D45 Polycythemia vera: Secondary | ICD-10-CM

## 2015-07-02 DIAGNOSIS — L723 Sebaceous cyst: Secondary | ICD-10-CM | POA: Diagnosis not present

## 2015-07-02 DIAGNOSIS — I10 Essential (primary) hypertension: Secondary | ICD-10-CM | POA: Diagnosis not present

## 2015-07-02 DIAGNOSIS — E785 Hyperlipidemia, unspecified: Secondary | ICD-10-CM

## 2015-07-02 DIAGNOSIS — D509 Iron deficiency anemia, unspecified: Secondary | ICD-10-CM | POA: Diagnosis present

## 2015-07-02 DIAGNOSIS — E559 Vitamin D deficiency, unspecified: Secondary | ICD-10-CM | POA: Diagnosis not present

## 2015-07-02 MED ORDER — SODIUM CHLORIDE 0.9 % IV SOLN
Freq: Once | INTRAVENOUS | Status: AC
  Administered 2015-07-02: 13:00:00 via INTRAVENOUS

## 2015-07-02 MED ORDER — SODIUM CHLORIDE 0.9 % IV SOLN
510.0000 mg | Freq: Once | INTRAVENOUS | Status: AC
  Administered 2015-07-02: 510 mg via INTRAVENOUS
  Filled 2015-07-02: qty 17

## 2015-07-02 MED ORDER — SILDENAFIL CITRATE 20 MG PO TABS
ORAL_TABLET | ORAL | Status: DC
Start: 2015-07-02 — End: 2015-12-31

## 2015-07-02 NOTE — Progress Notes (Signed)
Subjective:    Patient ID: Connor [name], male    DOB: [birth year], 70 y.o.   MRN: [id number]  HPI Pt here for follow up and management of chronic medical problems which includes hyperlipidemia and hypertension. He is taking medications regularly.The patient was recently treated with Augmentin for an upper respiratory infection. He also has had an anemia and did give blood recently and had an iron infusion today. He is followed by the urologist for his BPH and PSA in the last visit his PSA was 2.37. His ferritin level at the hematologist's office was 10 today and he had an iron infusion as mentioned. The patient has recovered well from his upper respiratory infection and is still taking his Augmentin. He is also still taking his cough medicine. He is feeling much better. He does have occasional episodes of sharp chest wall pain that lasts only a few seconds. This using occurs after exercise playing golf but not during the activity. He otherwise denies any chest pain or pressure or shortness of breath. He's having no problems with swallowing his food with heartburn or indigestion. We did discuss his taking the proton pump inhibitor and he may try taking Zantac instead. He denies any blood in the stool or black tarry bowel movements. He is passing his water well without problems. He is requesting something for his sexual function and we recommended that he try generic sildenafil. We will give him a prescription of this before he leaves the office today.      Patient Active Problem List   Diagnosis Date Noted  . Hyperlipidemia LDL goal <100 04/12/2014  . Polycythemia vera ([place]) 04/19/2013  . Family history of early CAD 02/19/2013  . Iron deficiency anemia 09/01/2012  . H/O vitamin D deficiency 05/21/2012  . Diverticulosis of colon 05/21/2012  . Carotid artery stenosis, asymptomatic 05/21/2012  . BPH (benign prostatic hyperplasia)   . Cardiomegaly   . Thrombocytopenia ([place])   . Lap Nissen  Fundoplication August 0000000 11/26/2011  . Weight gain, abnormal 08/27/2009  . ESSENTIAL HYPERTENSION, BENIGN 08/27/2009  . OSA (obstructive sleep apnea) 02/19/2008  . COPD, mild ([place]) 05/26/2007   Outpatient Encounter Prescriptions as of 07/02/2015  Medication Sig  . alfuzosin (UROXATRAL) 10 MG 24 hr tablet Take 10 mg by mouth every morning.   [name] amoxicillin-clavulanate (AUGMENTIN) 875-125 MG tablet Take 1 tablet by mouth 2 (two) times daily.  [name] aspirin 81 MG tablet Take 81 mg by mouth 2 (two) times daily.   . Cholecalciferol (VITAMIN D-3) 5000 UNITS TABS Take 1 tablet by mouth daily.   . Coenzyme Q10 (CO Q 10 PO) Take 1 capsule by mouth daily.   . cyclobenzaprine (FLEXERIL) 10 MG tablet Take 1 tablet (10 mg total) by mouth 3 (three) times daily as needed for muscle spasms.  . fish oil-omega-3 fatty acids 1000 MG capsule Take 2 g by mouth daily.   . Glucosamine-Chondroit-Vit C-Mn (GLUCOSAMINE CHONDR 1500 COMPLX) CAPS Take by mouth every morning.  . Ipratropium-Albuterol (COMBIVENT) 20-100 MCG/ACT AERS respimat Inhale 1 puff into the lungs as needed for wheezing or shortness of breath.  . losartan (COZAAR) 50 MG tablet TAKE 1 TABLET (50 MG TOTAL) BY MOUTH DAILY.  . Multiple Vitamins-Minerals (CENTRUM SILVER ULTRA MENS) TABS Take 1 tablet by mouth every morning. Once a day  . polyethylene glycol (MIRALAX / GLYCOLAX) packet Take 17 g by mouth as needed.   . Probiotic Product (PHILLIPS COLON HEALTH PO) Take 1 capsule by mouth daily.   [name]  pyridOXINE (VITAMIN B-6) 100 MG tablet Take 100 mg by mouth daily.   . rosuvastatin (CRESTOR) 10 MG tablet Take 1 tablet (10 mg total) by mouth daily.  . Turmeric 500 MG CAPS Take by mouth every morning.  . vitamin B-12 (CYANOCOBALAMIN) 500 MCG tablet Take 500 mcg by mouth daily.  . celecoxib (CELEBREX) 200 MG capsule Take 1 capsule by mouth daily. Reported on 07/02/2015  . montelukast (SINGULAIR) 10 MG tablet TAKE 1 TABLET AT BEDTIME (Patient not taking: Reported  on 07/02/2015)   No facility-administered encounter medications on file as of 07/02/2015.      Review of Systems  Constitutional: Negative.   HENT: Negative.   Eyes: Negative.   Respiratory: Negative.   Cardiovascular: Negative.   Gastrointestinal: Negative.   Endocrine: Negative.   Genitourinary: Negative.   Musculoskeletal: Negative.   Skin: Negative.   Allergic/Immunologic: Negative.   Neurological: Negative.   Hematological: Negative.   Psychiatric/Behavioral: Negative.        Objective:   Physical Exam  Constitutional: He is oriented to person, place, and time. He appears well-developed and well-nourished. No distress.  Pleasant and alert  HENT:  Head: Normocephalic and atraumatic.  Right Ear: External ear normal.  Left Ear: External ear normal.  Nose: Nose normal.  Mouth/Throat: Oropharynx is clear and moist. No oropharyngeal exudate.  Eyes: Conjunctivae and EOM are normal. Pupils are equal, round, and reactive to light. Right eye exhibits no discharge. Left eye exhibits no discharge. No scleral icterus.  Neck: Normal range of motion. Neck supple. No thyromegaly present.  Cardiovascular: Normal rate, regular rhythm and intact distal pulses.   No murmur heard. Heart is regular at 72/m. He last saw the cardiologist in January.  Pulmonary/Chest: Effort normal and breath sounds normal. No respiratory distress. He has no wheezes. He has no rales. He exhibits no tenderness.  Clear anteriorly and posteriorly and no axillary adenopathy  Abdominal: Soft. Bowel sounds are normal. He exhibits no mass. There is no tenderness. There is no rebound and no guarding.  Musculoskeletal: Normal range of motion. He exhibits no edema.  Lymphadenopathy:    He has no cervical adenopathy.  Neurological: He is alert and oriented to person, place, and time. He has normal reflexes. No cranial nerve deficit.  Skin: Skin is warm and dry. No rash noted.  Psychiatric: He has a normal mood and  affect. His behavior is normal. Judgment and thought content normal.  Nursing note and vitals reviewed.  BP 127/67 mmHg  Pulse 81  Temp(Src) 97 F (36.1 C) (Oral)  Ht 5\' 11"  (1.803 m)  Wt 239 lb (108.41 kg)  BMI 33.35 kg/m2        Assessment & Plan:  1. Essential hypertension, benign -The blood pressure is good today and he will continue with current treatment  2. Thrombocytopenia ([place]) -Continue to follow-up with hematology.   3. BPH (benign prostatic hyperplasia) -Continue to follow-up with urology  4. Hyperlipidemia -Continue current treatment pending results of lab work  5. Vitamin D deficiency -Continue vitamin D pending results of lab work  6. Polycythemia vera ([place]) -Continue to follow-up with hematology  7. Iron deficiency anemia -The patient received an iron infusion today and we will plan to get a CBC in 3-4 weeks  8. Hyperlipidemia LDL goal <100 -Continue current treatment and aggressive therapeutic lifestyle changes  9. COPD, mild ([place])  10. Sebaceous cyst -This is in the suprapubic area and the patient will be referred to a surgeon to have  this opened and drained - Ambulatory referral to General Surgery  11. Erectile dysfunction due to arterial insufficiency -Generic sildenafil prescribed  Meds ordered this encounter  Medications  . sildenafil (REVATIO) 20 MG tablet    Sig: Take 2-5 tabs prior to sexual activity    Dispense:  50 tablet    Refill:  1   Patient Instructions                       Medicare Annual Wellness Visit  [facility] and the medical providers at [facility] strive to bring you the best medical care.  In doing so we not only want to address your current medical conditions and concerns but also to detect new conditions early and prevent illness, disease and health-related problems.    Medicare offers a yearly Wellness Visit which allows our clinical staff to assess your need for preventative services  including immunizations, lifestyle education, counseling to decrease risk of preventable diseases and screening for fall risk and other medical concerns.    This visit is provided free of charge (no copay) for all Medicare recipients. The clinical pharmacists at [facility] have begun to conduct these Wellness Visits which will also include a thorough review of all your medications.    As you primary medical provider recommend that you make an appointment for your Annual Wellness Visit if you have not done so already this year.  You may set up this appointment before you leave today or you may call back [phone]) and schedule an appointment.  Please make sure when you call that you mention that you are scheduling your Annual Wellness Visit with the clinical pharmacist so that the appointment may be made for the proper length of time.     Continue current medications. Continue good therapeutic lifestyle changes which include good diet and exercise. Fall precautions discussed with patient. If an FOBT was given today- please return it to our front desk. If you are over 18 years old - you may need Prevnar 3 or the adult Pneumonia vaccine.  **Flu shots are available--- please call and schedule a FLU-CLINIC appointment**  After your visit with [place] today you will receive a survey in the mail or online from [employer] regarding your care with [place]. Please take a moment to fill this out. Your feedback is very important to [place] as you can help [place] better understand your patient needs as well as improve your experience and satisfaction. WE CARE ABOUT YOU!!!   Continue to follow-up with the hematologist Continue to follow-up with the yearly visits to the cardiologist Continue to work on exercise and diet Continue to follow up with your urologist. Be sure and let the hematologist know about your giving blood with the [facility] as specimen make him aware of that you've had that done prior  to your visits with him.   Arrie Senate MD

## 2015-07-02 NOTE — Patient Instructions (Addendum)
Medicare Annual Wellness Visit  [facility] and the medical providers at [facility] strive to bring you the best medical care.  In doing so we not only want to address your current medical conditions and concerns but also to detect new conditions early and prevent illness, disease and health-related problems.    Medicare offers a yearly Wellness Visit which allows our clinical staff to assess your need for preventative services including immunizations, lifestyle education, counseling to decrease risk of preventable diseases and screening for fall risk and other medical concerns.    This visit is provided free of charge (no copay) for all Medicare recipients. The clinical pharmacists at [facility] have begun to conduct these Wellness Visits which will also include a thorough review of all your medications.    As you primary medical provider recommend that you make an appointment for your Annual Wellness Visit if you have not done so already this year.  You may set up this appointment before you leave today or you may call back [phone] and schedule an appointment.  Please make sure when you call that you mention that you are scheduling your Annual Wellness Visit with the clinical pharmacist so that the appointment may be made for the proper length of time.     Continue current medications. Continue good therapeutic lifestyle changes which include good diet and exercise. Fall precautions discussed with patient. If an FOBT was given today- please return it to our front desk. If you are over 34 years old - you may need Prevnar 4 or the adult Pneumonia vaccine.  **Flu shots are available--- please call and schedule a FLU-CLINIC appointment**  After your visit with [place] today you will receive a survey in the mail or online from [employer] regarding your care with [place]. Please take a moment to fill this out. Your feedback is very  important to [place] as you can help [place] better understand your patient needs as well as improve your experience and satisfaction. WE CARE ABOUT YOU!!!   Continue to follow-up with the hematologist Continue to follow-up with the yearly visits to the cardiologist Continue to work on exercise and diet Continue to follow up with your urologist. Be sure and let the hematologist know about your giving blood with the [facility] as specimen make him aware of that you've had that done prior to your visits with him.

## 2015-07-02 NOTE — Patient Instructions (Signed)

## 2015-07-15 ENCOUNTER — Other Ambulatory Visit: Payer: Self-pay | Admitting: Hematology & Oncology

## 2015-07-18 DIAGNOSIS — H2513 Age-related nuclear cataract, bilateral: Secondary | ICD-10-CM | POA: Diagnosis not present

## 2015-07-21 DIAGNOSIS — L723 Sebaceous cyst: Secondary | ICD-10-CM | POA: Diagnosis not present

## 2015-08-12 ENCOUNTER — Other Ambulatory Visit: Payer: Self-pay | Admitting: General Surgery

## 2015-08-12 DIAGNOSIS — L723 Sebaceous cyst: Secondary | ICD-10-CM | POA: Diagnosis not present

## 2015-08-12 DIAGNOSIS — L720 Epidermal cyst: Secondary | ICD-10-CM | POA: Diagnosis not present

## 2015-08-18 ENCOUNTER — Other Ambulatory Visit: Payer: Medicare Other

## 2015-08-18 DIAGNOSIS — D649 Anemia, unspecified: Secondary | ICD-10-CM | POA: Diagnosis not present

## 2015-08-18 DIAGNOSIS — R710 Precipitous drop in hematocrit: Secondary | ICD-10-CM

## 2015-08-19 LAB — CBC WITH DIFFERENTIAL/PLATELET
Basophils Absolute: 0 10*3/uL (ref 0.0–0.2)
Basos: 1 %
EOS (ABSOLUTE): 0.1 10*3/uL (ref 0.0–0.4)
Eos: 3 %
Hematocrit: 43.2 % (ref 37.5–51.0)
Hemoglobin: 13.2 g/dL (ref 12.6–17.7)
Immature Grans (Abs): 0 10*3/uL (ref 0.0–0.1)
Immature Granulocytes: 0 %
Lymphocytes Absolute: 1 10*3/uL (ref 0.7–3.1)
Lymphs: 22 %
MCH: 24.7 pg — ABNORMAL LOW (ref 26.6–33.0)
MCHC: 30.6 g/dL — ABNORMAL LOW (ref 31.5–35.7)
MCV: 81 fL (ref 79–97)
Monocytes Absolute: 0.3 10*3/uL (ref 0.1–0.9)
Monocytes: 7 %
Neutrophils Absolute: 3 10*3/uL (ref 1.4–7.0)
Neutrophils: 67 %
Platelets: 150 10*3/uL (ref 150–379)
RBC: 5.34 x10E6/uL (ref 4.14–5.80)
RDW: 19 % — ABNORMAL HIGH (ref 12.3–15.4)
WBC: 4.5 10*3/uL (ref 3.4–10.8)

## 2015-08-20 LAB — FECAL OCCULT BLOOD, IMMUNOCHEMICAL: FECAL OCCULT BLD: NEGATIVE

## 2015-09-05 ENCOUNTER — Encounter: Payer: Self-pay | Admitting: Family Medicine

## 2015-09-05 ENCOUNTER — Ambulatory Visit (INDEPENDENT_AMBULATORY_CARE_PROVIDER_SITE_OTHER): Payer: Medicare Other | Admitting: Family Medicine

## 2015-09-05 VITALS — BP 132/68 | HR 76 | Temp 98.3°F | Ht 71.0 in | Wt 236.2 lb

## 2015-09-05 DIAGNOSIS — D45 Polycythemia vera: Secondary | ICD-10-CM | POA: Diagnosis not present

## 2015-09-05 DIAGNOSIS — D696 Thrombocytopenia, unspecified: Secondary | ICD-10-CM

## 2015-09-05 DIAGNOSIS — J441 Chronic obstructive pulmonary disease with (acute) exacerbation: Secondary | ICD-10-CM | POA: Diagnosis not present

## 2015-09-05 DIAGNOSIS — J449 Chronic obstructive pulmonary disease, unspecified: Secondary | ICD-10-CM

## 2015-09-05 MED ORDER — GUAIFENESIN-CODEINE 100-10 MG/5ML PO SYRP: 5.0000 mL | ORAL_SOLUTION | ORAL | Status: DC | PRN

## 2015-09-05 MED ORDER — AMOXICILLIN-POT CLAVULANATE 500-125 MG PO TABS: 1.0000 | ORAL_TABLET | Freq: Two times a day (BID) | ORAL | Status: AC

## 2015-09-05 NOTE — Progress Notes (Signed)
Subjective:  Patient ID: MACKLIN EINSTEIN, male    DOB: 1946/01/27  Age: 70 y.o. MRN: [id number]  CC: Cough   HPI LEVERETT HEINZE presents for Patient presents with upper respiratory congestion.No rhinorrhea. There is moderate sore throat with hoarseness. Patient reports coughing frequently as well.White to yellow mucoid sputum noted. There is no fever no chills no sweats. The patient reports mild short of breath. Onset was 7 days ago. Gradually worsening .   History Clemons has a past medical history of Hyperlipemia; Nephrolithiasis; Carotid artery stenosis; Cough (05/26/2007); COPD, mild ([facility]) (05/26/2007); GERD (gastroesophageal reflux disease); Shortness of breath; Sleep apnea; Cancer ([facility]); Erectile dysfunction; Peyronie's disease; Anemia; BPH (benign prostatic hyperplasia); Cardiomegaly; H/O vitamin D deficiency; Thrombocytopenia ([place]); Iron deficiency anemia, unspecified (09/01/2012); Polycythemia vera(238.4) (04/19/2013); echocardiogram (02/15/2012); and History of nuclear stress test.   He has past surgical history that includes Appendectomy; Knee surgery; Shoulder surgery; Nose surgery (2013); Laparoscopic Nissen fundoplication ([id number]); and Hiatal hernia repair (11/24/2011).   His family history includes Diabetes in his brother and mother; Heart disease in his brother, father, mother, and sister; Liver cancer in his paternal grandfather; Throat cancer in his maternal grandfather. There is no history of Colon cancer, Esophageal cancer, Rectal cancer, or Stomach cancer.He reports that he quit smoking about 49 years ago. His smoking use included Cigarettes. He started smoking about 57 years ago. He has a 2.75 pack-year smoking history. He has never used smokeless tobacco. He reports that he drinks alcohol. He reports that he does not use illicit drugs.    ROS Review of Systems  Constitutional: Negative for fever, chills, activity change and appetite change.  HENT: Positive for congestion,  postnasal drip, rhinorrhea and sinus pressure. Negative for ear discharge, ear pain, hearing loss, nosebleeds, sneezing and trouble swallowing.   Respiratory: Positive for cough. Negative for chest tightness and shortness of breath.   Cardiovascular: Negative for chest pain and palpitations.  Skin: Negative for rash.    Objective:  BP 132/68 mmHg  Pulse 76  Temp(Src) 98.3 F (36.8 C) (Oral)  Ht 5\' 11"  (1.803 m)  Wt 236 lb 4 oz (107.162 kg)  BMI 32.96 kg/m2  BP Readings from Last 3 Encounters:  09/05/15 132/68  07/02/15 127/67  07/02/15 139/54    Wt Readings from Last 3 Encounters:  09/05/15 236 lb 4 oz (107.162 kg)  07/02/15 239 lb (108.41 kg)  06/25/15 242 lb (109.77 kg)     Physical Exam  Constitutional: He appears well-developed and well-nourished.  HENT:  Head: Normocephalic and atraumatic.  Right Ear: Tympanic membrane and external ear normal. No decreased hearing is noted.  Left Ear: Tympanic membrane and external ear normal. No decreased hearing is noted.  Nose: Mucosal edema present. Right sinus exhibits no frontal sinus tenderness. Left sinus exhibits no frontal sinus tenderness.  Mouth/Throat: No oropharyngeal exudate or posterior oropharyngeal erythema.  Neck: No Brudzinski's sign noted.  Pulmonary/Chest: Breath sounds normal. No respiratory distress.  Lymphadenopathy:       Head (right side): No preauricular adenopathy present.       Head (left side): No preauricular adenopathy present.       Right cervical: No superficial cervical adenopathy present.      Left cervical: No superficial cervical adenopathy present.     Lab Results  Component Value Date   WBC 4.5 08/18/2015   HGB 12.0* 03/26/2015   HCT 43.2 08/18/2015   PLT 150 08/18/2015   GLUCOSE 105* 06/20/2015   CHOL 114 06/20/2015  TRIG 64 06/20/2015   HDL 34* 06/20/2015   LDLCALC 80 02/11/2014   ALT 18 06/20/2015   AST 26 06/20/2015   NA 142 06/20/2015   K 4.4 06/20/2015   CL 104  06/20/2015   CREATININE 0.98 06/20/2015   BUN 16 06/20/2015   CO2 23 06/20/2015   TSH 1.400 02/11/2014   INR 1.02 07/28/2012    No results found.  Assessment & Plan:   Edgar was seen today for cough.  Diagnoses and all orders for this visit:  COPD exacerbation ([place])  Polycythemia vera ([place])  Thrombocytopenia ([facility])  COPD, mild ([place])      I have discontinued Mr. Coonrod's amoxicillin-clavulanate. I am also having him maintain his aspirin, fish oil-omega-3 fatty acids, alfuzosin, Vitamin D-3, Coenzyme Q10 (CO Q 10 PO), CENTRUM SILVER ULTRA MENS, pyridOXINE, GLUCOSAMINE CHONDR 1500 COMPLX, polyethylene glycol, Probiotic Product (PHILLIPS COLON HEALTH PO), Turmeric, Ipratropium-Albuterol, rosuvastatin, cyclobenzaprine, celecoxib, vitamin B-12, losartan, sildenafil, and montelukast.  No orders of the defined types were placed in this encounter.     Follow-up: No Follow-up on file.  Claretta Fraise, [name]

## 2015-09-10 ENCOUNTER — Other Ambulatory Visit: Payer: Self-pay | Admitting: Family Medicine

## 2015-09-25 ENCOUNTER — Ambulatory Visit: Payer: Medicare Other

## 2015-09-25 ENCOUNTER — Ambulatory Visit (HOSPITAL_BASED_OUTPATIENT_CLINIC_OR_DEPARTMENT_OTHER): Payer: Medicare Other | Admitting: Hematology & Oncology

## 2015-09-25 ENCOUNTER — Encounter: Payer: Self-pay | Admitting: Hematology & Oncology

## 2015-09-25 ENCOUNTER — Other Ambulatory Visit (HOSPITAL_BASED_OUTPATIENT_CLINIC_OR_DEPARTMENT_OTHER): Payer: Medicare Other

## 2015-09-25 VITALS — BP 136/72 | HR 80 | Temp 97.5°F | Resp 16 | Ht 71.0 in | Wt 235.0 lb

## 2015-09-25 DIAGNOSIS — D509 Iron deficiency anemia, unspecified: Secondary | ICD-10-CM

## 2015-09-25 DIAGNOSIS — D45 Polycythemia vera: Secondary | ICD-10-CM

## 2015-09-25 DIAGNOSIS — D696 Thrombocytopenia, unspecified: Secondary | ICD-10-CM

## 2015-09-25 DIAGNOSIS — D751 Secondary polycythemia: Secondary | ICD-10-CM

## 2015-09-25 DIAGNOSIS — R21 Rash and other nonspecific skin eruption: Secondary | ICD-10-CM | POA: Diagnosis not present

## 2015-09-25 DIAGNOSIS — A6920 Lyme disease, unspecified: Secondary | ICD-10-CM

## 2015-09-25 LAB — CBC WITH DIFFERENTIAL (CANCER CENTER ONLY)
BASO#: 0 10*3/uL (ref 0.0–0.2)
BASO%: 0.7 % (ref 0.0–2.0)
EOS ABS: 0.1 10*3/uL (ref 0.0–0.5)
EOS%: 2.9 % (ref 0.0–7.0)
HCT: 42.7 % (ref 38.7–49.9)
HGB: 13.6 g/dL (ref 13.0–17.1)
LYMPH#: 1 10*3/uL (ref 0.9–3.3)
LYMPH%: 21.1 % (ref 14.0–48.0)
MCH: 25.7 pg — AB (ref 28.0–33.4)
MCHC: 31.9 g/dL — AB (ref 32.0–35.9)
MCV: 81 fL — ABNORMAL LOW (ref 82–98)
MONO#: 0.4 10*3/uL (ref 0.1–0.9)
MONO%: 8.4 % (ref 0.0–13.0)
NEUT#: 3 10*3/uL (ref 1.5–6.5)
NEUT%: 66.9 % (ref 40.0–80.0)
RBC: 5.29 10*6/uL (ref 4.20–5.70)
RDW: 17.3 % — ABNORMAL HIGH (ref 11.1–15.7)
WBC: 4.5 10*3/uL (ref 4.0–10.0)

## 2015-09-25 LAB — IRON AND TIBC
%SAT: 13 % — ABNORMAL LOW (ref 20–55)
Iron: 45 ug/dL (ref 42–163)
TIBC: 360 ug/dL (ref 202–409)
UIBC: 315 ug/dL (ref 117–376)

## 2015-09-25 LAB — CMP (CANCER CENTER ONLY)
ALK PHOS: 56 U/L (ref 26–84)
ALT: 28 U/L (ref 10–47)
AST: 27 U/L (ref 11–38)
Albumin: 3.7 g/dL (ref 3.3–5.5)
BILIRUBIN TOTAL: 0.9 mg/dL (ref 0.20–1.60)
BUN, Bld: 23 mg/dL — ABNORMAL HIGH (ref 7–22)
CALCIUM: 9.1 mg/dL (ref 8.0–10.3)
CO2: 28 mEq/L (ref 18–33)
Chloride: 108 mEq/L (ref 98–108)
Creat: 1.1 mg/dl (ref 0.6–1.2)
GLUCOSE: 112 mg/dL (ref 73–118)
Potassium: 4.1 mEq/L (ref 3.3–4.7)
SODIUM: 140 meq/L (ref 128–145)
TOTAL PROTEIN: 6.3 g/dL — AB (ref 6.4–8.1)

## 2015-09-25 LAB — CHCC SATELLITE - SMEAR

## 2015-09-25 LAB — FERRITIN: FERRITIN: 7 ng/mL — AB (ref 22–316)

## 2015-09-25 MED ORDER — DOXYCYCLINE HYCLATE 100 MG PO TABS: 100.0000 mg | ORAL_TABLET | Freq: Two times a day (BID) | ORAL | Status: DC

## 2015-09-25 NOTE — Progress Notes (Signed)
Hematology and Oncology Follow Up Visit  Connor [name] [id number] [birth year] 70 y.o. 09/25/2015   Principle Diagnosis:   Polycythemia vera-Triple negative  Chronic immune thrombocytopenia vs medication  Current Therapy:    Phlebotomy to maintain hematocrit below 45%  Aspirin 81 mg by mouth daily     Interim History:  [name] is back for followup. He feels pretty well. He really has had no specific complaints. However, he has noticed a rash in the right inguinal area. He is not sure if he was bitten by a take. He has been outside quite a lot. He's been doing a lot of mowing.  We last saw him in March, his ferritin was 10.  He still has some pruritus when he takes a shower.  We have not had to phlebotomize him for quite a while. He is certainly deficient right now.  He has not had any obvious bleeding.   Overall, his performance status is ECOG 1.   Medications:  Current outpatient prescriptions:  .  alfuzosin (UROXATRAL) 10 MG 24 hr tablet, Take 10 mg by mouth every morning. , Disp: , Rfl:  .  aspirin 81 MG tablet, Take 81 mg by mouth 2 (two) times daily. , Disp: , Rfl:  .  celecoxib (CELEBREX) 200 MG capsule, Take 1 capsule by mouth daily. Reported on 07/02/2015, Disp: , Rfl:  .  Cholecalciferol (VITAMIN D-3) 5000 UNITS TABS, Take 1 tablet by mouth daily. , Disp: , Rfl:  .  Coenzyme Q10 (CO Q 10 PO), Take 1 capsule by mouth daily. , Disp: , Rfl:  .  CRESTOR 10 MG tablet, TAKE 1 TABLET (10 MG TOTAL) BY MOUTH DAILY., Disp: 90 tablet, Rfl: 0 .  cyclobenzaprine (FLEXERIL) 10 MG tablet, Take 1 tablet (10 mg total) by mouth 3 (three) times daily as needed for muscle spasms., Disp: 30 tablet, Rfl: 1 .  fish oil-omega-3 fatty acids 1000 MG capsule, Take 2 g by mouth daily. , Disp: , Rfl:  .  Glucosamine-Chondroit-Vit C-Mn (GLUCOSAMINE CHONDR 1500 COMPLX) CAPS, Take by mouth every morning., Disp: , Rfl:  .  guaiFENesin-codeine (CHERATUSSIN AC) 100-10 MG/5ML syrup, Take 5 mLs by  mouth every 4 (four) hours as needed for cough., Disp: 180 mL, Rfl: 0 .  Ipratropium-Albuterol (COMBIVENT) 20-100 MCG/ACT AERS respimat, Inhale 1 puff into the lungs as needed for wheezing or shortness of breath., Disp: 1 Inhaler, Rfl: 11 .  losartan (COZAAR) 50 MG tablet, TAKE 1 TABLET (50 MG TOTAL) BY MOUTH DAILY., Disp: 90 tablet, Rfl: 0 .  montelukast (SINGULAIR) 10 MG tablet, TAKE 1 TABLET AT BEDTIME, Disp: 30 tablet, Rfl: 4 .  Multiple Vitamins-Minerals (CENTRUM SILVER ULTRA MENS) TABS, Take 1 tablet by mouth every morning. Once a day, Disp: , Rfl:  .  polyethylene glycol (MIRALAX / GLYCOLAX) packet, Take 17 g by mouth as needed. , Disp: , Rfl:  .  Probiotic Product (PHILLIPS COLON HEALTH PO), Take 1 capsule by mouth daily. , Disp: , Rfl:  .  pyridOXINE (VITAMIN B-6) 100 MG tablet, Take 100 mg by mouth daily. , Disp: , Rfl:  .  sildenafil (REVATIO) 20 MG tablet, Take 2-5 tabs prior to sexual activity, Disp: 50 tablet, Rfl: 1 .  Turmeric 500 MG CAPS, Take by mouth every morning., Disp: , Rfl:  .  vitamin B-12 (CYANOCOBALAMIN) 500 MCG tablet, Take 500 mcg by mouth daily., Disp: , Rfl:  .  doxycycline (VIBRA-TABS) 100 MG tablet, Take 1 tablet (100 mg total) by  mouth 2 (two) times daily., Disp: 42 tablet, Rfl: 0  Allergies:  Allergies  Allergen Reactions  . Dexlansoprazole Diarrhea and Nausea Only  . Dexlansoprazole Diarrhea and Nausea Only    unknown    Past Medical History, Surgical history, Social history, and Family History were reviewed and updated.  Review of Systems: As above  Physical Exam:  height is 5\' 11"  (1.803 m) and weight is 235 lb (106.595 kg). His oral temperature is 97.5 F (36.4 C). His blood pressure is 136/72 and his pulse is 80. His respiration is 16.   Well-developed and well-nourished white gentleman. Head and neck exam shows no ocular or oral lesions.  He has no palpable cervical or supraclavicular lymph nodes. Lungs are clear. Cardiac exam regular rate and  rhythm with no murmurs rubs or bruits. Abdomen is soft.  He has good bowel sounds. There is no fluid wave. There is no palpable liver or spleen tip. Extremities shows no clubbing cyanosis or edema. Strength is good bilaterally. Skin exam shows an erythematous-type rash with central clearing in the right inguinal area. There might be a bite mark.  Lab Results  Component Value Date   WBC 4.5 09/25/2015   HGB 13.6 09/25/2015   HCT 42.7 09/25/2015   MCV 81* 09/25/2015   PLT 120 Platelet count consistent in citrate* 09/25/2015     Chemistry      Component Value Date/Time   NA 140 09/25/2015 1009   NA 142 06/20/2015 1225   NA 143 10/02/2014 1007   K 4.1 09/25/2015 1009   K 4.4 06/20/2015 1225   CL 108 09/25/2015 1009   CL 104 06/20/2015 1225   CO2 28 09/25/2015 1009   CO2 23 06/20/2015 1225   BUN 23* 09/25/2015 1009   BUN 16 06/20/2015 1225   BUN 29* 10/02/2014 1007   CREATININE 1.1 09/25/2015 1009   CREATININE 0.98 06/20/2015 1225      Component Value Date/Time   CALCIUM 9.1 09/25/2015 1009   CALCIUM 9.2 06/20/2015 1225   ALKPHOS 56 09/25/2015 1009   ALKPHOS 59 06/20/2015 1225   AST 27 09/25/2015 1009   AST 26 06/20/2015 1225   ALT 28 09/25/2015 1009   ALT 18 06/20/2015 1225   BILITOT 0.90 09/25/2015 1009   BILITOT 0.5 06/20/2015 1225   BILITOT 0.6 10/02/2014 1007         Impression and Plan: Connor [name] is a 70 year old gentleman with polycythemia. His blood count looks great today. His hematocrit is still below 45% so we do not have to phlebotomize him.  I am a little worried about this rash in the right inguinal region. It almost looks like erythema migrans. He does not recall a tick bite but it looks right there is a marked in the center of this rash.  I will start him on doxycycline. I will have him take 100 mg twice a day. He will do this for 21 days.   We will plan to get him back in another 3 months or so. [name]  Connor Napoleon, MD 6/15/201711:29 AM

## 2015-09-25 NOTE — Progress Notes (Signed)
Phlebotomy not needed per Dr Marin Olp; HCT 42.7

## 2015-09-26 ENCOUNTER — Telehealth: Payer: Self-pay | Admitting: Family Medicine

## 2015-09-26 ENCOUNTER — Encounter: Payer: Self-pay | Admitting: Family Medicine

## 2015-09-26 ENCOUNTER — Ambulatory Visit (INDEPENDENT_AMBULATORY_CARE_PROVIDER_SITE_OTHER): Payer: Medicare Other | Admitting: Family Medicine

## 2015-09-26 VITALS — BP 128/77 | HR 80 | Temp 98.4°F | Ht 71.0 in | Wt 234.8 lb

## 2015-09-26 DIAGNOSIS — W57XXXA Bitten or stung by nonvenomous insect and other nonvenomous arthropods, initial encounter: Secondary | ICD-10-CM

## 2015-09-26 DIAGNOSIS — S70921A Unspecified superficial injury of right thigh, initial encounter: Secondary | ICD-10-CM | POA: Diagnosis not present

## 2015-09-26 NOTE — Telephone Encounter (Signed)
Pt came in today for appt.

## 2015-09-26 NOTE — Progress Notes (Signed)
BP 128/77 mmHg  Pulse 80  Temp(Src) 98.4 F (36.9 C) (Oral)  Ht 5\' 11"  (1.803 m)  Wt 234 lb 12.8 oz (106.505 kg)  BMI 32.76 kg/m2   Subjective:    Patient ID: Connor [name], male    DOB: [birth year], 70 y.o.   MRN: [id number]  HPI: Connor [name] is a 70 y.o. male presenting on 09/26/2015 for Insect Bite   HPI Tick bite Patient has what appears to be a tick bite in his right groin with surrounding erythema and in an oval pattern. He was told by the hematologist that it looked like it could be Lyme disease and was given doxycycline yesterday. He denies any fevers or chills or redness or warmth anywhere else besides that spot. He has not had any drainage out of it. He did not see the tick but does not know if it was there for future or not.  Relevant past medical, surgical, family and social history reviewed and updated as indicated. Interim medical history since our last visit reviewed. Allergies and medications reviewed and updated.  Review of Systems  Constitutional: Negative for fever.  HENT: Negative for ear discharge and ear pain.   Eyes: Negative for discharge and visual disturbance.  Respiratory: Negative for shortness of breath and wheezing.   Cardiovascular: Negative for chest pain and leg swelling.  Gastrointestinal: Negative for abdominal pain, diarrhea and constipation.  Genitourinary: Negative for difficulty urinating.  Musculoskeletal: Negative for back pain and gait problem.  Skin: Positive for color change and rash.  Neurological: Negative for dizziness, syncope, light-headedness and headaches.  All other systems reviewed and are negative.   Per HPI unless specifically indicated above     Medication List       This list is accurate as of: 09/26/15  2:44 PM.  Always use your most recent med list.               alfuzosin 10 MG 24 hr tablet  Commonly known as:  UROXATRAL  Take 10 mg by mouth every morning.     aspirin 81 MG tablet  Take 81 mg by  mouth 2 (two) times daily.     CENTRUM SILVER ULTRA MENS Tabs  Take 1 tablet by mouth every morning. Once a day     CO Q 10 PO  Take 1 capsule by mouth daily.     CRESTOR 10 MG tablet  Generic drug:  rosuvastatin  TAKE 1 TABLET (10 MG TOTAL) BY MOUTH DAILY.     cyclobenzaprine 10 MG tablet  Commonly known as:  FLEXERIL  Take 1 tablet (10 mg total) by mouth 3 (three) times daily as needed for muscle spasms.     doxycycline 100 MG tablet  Commonly known as:  VIBRA-TABS  Take 1 tablet (100 mg total) by mouth 2 (two) times daily.     fish oil-omega-3 fatty acids 1000 MG capsule  Take 2 g by mouth daily.     GLUCOSAMINE CHONDR 1500 COMPLX Caps  Take by mouth every morning.     guaiFENesin-codeine 100-10 MG/5ML syrup  Commonly known as:  CHERATUSSIN AC  Take 5 mLs by mouth every 4 (four) hours as needed for cough.     Ipratropium-Albuterol 20-100 MCG/ACT Aers respimat  Commonly known as:  COMBIVENT  Inhale 1 puff into the lungs as needed for wheezing or shortness of breath.     losartan 50 MG tablet  Commonly known as:  COZAAR  TAKE 1 TABLET (  50 MG TOTAL) BY MOUTH DAILY.     montelukast 10 MG tablet  Commonly known as:  SINGULAIR  TAKE 1 TABLET AT BEDTIME     PHILLIPS COLON HEALTH PO  Take 1 capsule by mouth daily.     polyethylene glycol packet  Commonly known as:  MIRALAX / GLYCOLAX  Take 17 g by mouth as needed.     pyridOXINE 100 MG tablet  Commonly known as:  VITAMIN B-6  Take 100 mg by mouth daily.     sildenafil 20 MG tablet  Commonly known as:  REVATIO  Take 2-5 tabs prior to sexual activity     Turmeric 500 MG Caps  Take by mouth every morning.     vitamin B-12 500 MCG tablet  Commonly known as:  CYANOCOBALAMIN  Take 500 mcg by mouth daily.     Vitamin D-3 5000 UNITS Tabs  Take 1 tablet by mouth daily.           Objective:    BP 128/77 mmHg  Pulse 80  Temp(Src) 98.4 F (36.9 C) (Oral)  Ht 5\' 11"  (1.803 m)  Wt 234 lb 12.8 oz (106.505 kg)   BMI 32.76 kg/m2  Wt Readings from Last 3 Encounters:  09/26/15 234 lb 12.8 oz (106.505 kg)  09/25/15 235 lb (106.595 kg)  09/05/15 236 lb 4 oz (107.162 kg)    Physical Exam  Constitutional: He is oriented to person, place, and time. He appears well-developed and well-nourished. No distress.  Eyes: Conjunctivae and EOM are normal. Pupils are equal, round, and reactive to light. Right eye exhibits no discharge. No scleral icterus.  Cardiovascular: Normal rate, regular rhythm, normal heart sounds and intact distal pulses.   No murmur heard. Pulmonary/Chest: Effort normal and breath sounds normal. No respiratory distress. He has no wheezes.  Musculoskeletal: Normal range of motion. He exhibits no edema.  Neurological: He is alert and oriented to person, place, and time. Coordination normal.  Skin: Skin is warm and dry. Lesion (Has a small papule in the center with surrounding oval-shaped macular rash with slight warmth. No induration or fluctuance noted.) noted. No rash noted. He is not diaphoretic. There is erythema.  Psychiatric: He has a normal mood and affect. His behavior is normal.  Nursing note and vitals reviewed.      Assessment & Plan:   Problem List Items Addressed This Visit    None    Visit Diagnoses    Tick bite    -  Primary    Started doxycycline yesterday. Typically testing is not accurate until 3 months so have him recheck when he gets his normal blood work done.        Follow up plan: Return if symptoms worsen or fail to improve.  Counseling provided for all of the vaccine components No orders of the defined types were placed in this encounter.    Caryl Pina, MD [facility] 09/26/2015, 2:44 PM

## 2015-11-03 ENCOUNTER — Telehealth: Payer: Self-pay | Admitting: Internal Medicine

## 2015-11-03 NOTE — Telephone Encounter (Signed)
Dr. Gessner will you accept? 

## 2015-11-03 NOTE — Telephone Encounter (Signed)
ok 

## 2015-12-08 ENCOUNTER — Other Ambulatory Visit: Payer: Self-pay | Admitting: Family Medicine

## 2015-12-10 DIAGNOSIS — R3912 Poor urinary stream: Secondary | ICD-10-CM | POA: Diagnosis not present

## 2015-12-10 DIAGNOSIS — R972 Elevated prostate specific antigen [PSA]: Secondary | ICD-10-CM | POA: Diagnosis not present

## 2015-12-10 DIAGNOSIS — N401 Enlarged prostate with lower urinary tract symptoms: Secondary | ICD-10-CM | POA: Diagnosis not present

## 2015-12-16 ENCOUNTER — Other Ambulatory Visit: Payer: Self-pay | Admitting: Hematology & Oncology

## 2015-12-26 ENCOUNTER — Other Ambulatory Visit (HOSPITAL_BASED_OUTPATIENT_CLINIC_OR_DEPARTMENT_OTHER): Payer: Medicare Other

## 2015-12-26 ENCOUNTER — Encounter: Payer: Self-pay | Admitting: Hematology & Oncology

## 2015-12-26 ENCOUNTER — Ambulatory Visit (HOSPITAL_BASED_OUTPATIENT_CLINIC_OR_DEPARTMENT_OTHER): Payer: Medicare Other

## 2015-12-26 ENCOUNTER — Ambulatory Visit (HOSPITAL_BASED_OUTPATIENT_CLINIC_OR_DEPARTMENT_OTHER): Payer: Medicare Other | Admitting: Hematology & Oncology

## 2015-12-26 VITALS — BP 151/72 | HR 75 | Temp 97.4°F | Resp 18 | Ht 71.0 in | Wt 237.0 lb

## 2015-12-26 DIAGNOSIS — R21 Rash and other nonspecific skin eruption: Secondary | ICD-10-CM | POA: Diagnosis not present

## 2015-12-26 DIAGNOSIS — A6920 Lyme disease, unspecified: Secondary | ICD-10-CM

## 2015-12-26 DIAGNOSIS — D509 Iron deficiency anemia, unspecified: Secondary | ICD-10-CM | POA: Diagnosis not present

## 2015-12-26 DIAGNOSIS — D696 Thrombocytopenia, unspecified: Secondary | ICD-10-CM

## 2015-12-26 DIAGNOSIS — D751 Secondary polycythemia: Secondary | ICD-10-CM

## 2015-12-26 DIAGNOSIS — D45 Polycythemia vera: Secondary | ICD-10-CM

## 2015-12-26 LAB — CBC WITH DIFFERENTIAL (CANCER CENTER ONLY)
BASO#: 0 10*3/uL (ref 0.0–0.2)
BASO%: 0.6 % (ref 0.0–2.0)
EOS ABS: 0.1 10*3/uL (ref 0.0–0.5)
EOS%: 3 % (ref 0.0–7.0)
HCT: 45.4 % (ref 38.7–49.9)
HEMOGLOBIN: 14.4 g/dL (ref 13.0–17.1)
LYMPH#: 1 10*3/uL (ref 0.9–3.3)
LYMPH%: 22.1 % (ref 14.0–48.0)
MCH: 26 pg — ABNORMAL LOW (ref 28.0–33.4)
MCHC: 31.7 g/dL — ABNORMAL LOW (ref 32.0–35.9)
MCV: 82 fL (ref 82–98)
MONO#: 0.3 10*3/uL (ref 0.1–0.9)
MONO%: 6.9 % (ref 0.0–13.0)
NEUT%: 67.4 % (ref 40.0–80.0)
NEUTROS ABS: 3.1 10*3/uL (ref 1.5–6.5)
PLATELETS: 127 10*3/uL — AB (ref 145–400)
RBC: 5.53 10*6/uL (ref 4.20–5.70)
RDW: 16.2 % — ABNORMAL HIGH (ref 11.1–15.7)
WBC: 4.7 10*3/uL (ref 4.0–10.0)

## 2015-12-26 LAB — COMPREHENSIVE METABOLIC PANEL
ALT: 29 U/L (ref 0–55)
ANION GAP: 9 meq/L (ref 3–11)
AST: 32 U/L (ref 5–34)
Albumin: 4 g/dL (ref 3.5–5.0)
Alkaline Phosphatase: 73 U/L (ref 40–150)
BUN: 18.2 mg/dL (ref 7.0–26.0)
CHLORIDE: 109 meq/L (ref 98–109)
CO2: 26 meq/L (ref 22–29)
CREATININE: 1 mg/dL (ref 0.7–1.3)
Calcium: 9.4 mg/dL (ref 8.4–10.4)
EGFR: 71 mL/min/{1.73_m2} — ABNORMAL LOW (ref >90)
Glucose: 125 mg/dl (ref 70–140)
Potassium: 3.9 mEq/L (ref 3.5–5.1)
Sodium: 144 mEq/L (ref 136–145)
Total Bilirubin: 0.92 mg/dL (ref 0.20–1.20)
Total Protein: 6.8 g/dL (ref 6.4–8.3)

## 2015-12-26 LAB — IRON AND TIBC
%SAT: 26 % (ref 20–55)
Iron: 107 ug/dL (ref 42–163)
TIBC: 414 ug/dL — AB (ref 202–409)
UIBC: 307 ug/dL (ref 117–376)

## 2015-12-26 LAB — FERRITIN: FERRITIN: 8 ng/mL — AB (ref 22–316)

## 2015-12-26 NOTE — Progress Notes (Signed)
Hematology and Oncology Follow Up Visit  Connor [name] [id number] Aug 11, 1945 70 y.o. 12/26/2015   Principle Diagnosis:   Polycythemia vera-Triple negative  Chronic immune thrombocytopenia vs medication  Current Therapy:    Phlebotomy to maintain hematocrit below 45%  Aspirin 81 mg by mouth daily     Interim History:  Mr.  Connor [name] is back for followup. He feels pretty well. He really has had no specific complaints. He and his wife discovered from the beach. That a good time.  He is playing some golf. His golf game is not as he would like it to be.   He's had no problems with rashes. Plus, saw him, he had a rash. This seems to have improved.   He's had no problems with bleeding. He's had no bruising.   There's been no problems with weight loss or weight gain. He's had no cough or shortness of breath.  Overall, his performance status is ECOG 1.   Medications:  Current Outpatient Prescriptions:  .  alfuzosin (UROXATRAL) 10 MG 24 hr tablet, Take 10 mg by mouth every morning. , Disp: , Rfl:  .  aspirin 81 MG tablet, Take 81 mg by mouth 2 (two) times daily. , Disp: , Rfl:  .  Cholecalciferol (VITAMIN D-3) 5000 UNITS TABS, Take 1 tablet by mouth daily. , Disp: , Rfl:  .  Coenzyme Q10 (CO Q 10 PO), Take 1 capsule by mouth daily. , Disp: , Rfl:  .  cyclobenzaprine (FLEXERIL) 10 MG tablet, Take 1 tablet (10 mg total) by mouth 3 (three) times daily as needed for muscle spasms., Disp: 30 tablet, Rfl: 1 .  fish oil-omega-3 fatty acids 1000 MG capsule, Take 2 g by mouth daily. , Disp: , Rfl:  .  Glucosamine-Chondroit-Vit C-Mn (GLUCOSAMINE CHONDR 1500 COMPLX) CAPS, Take by mouth every morning., Disp: , Rfl:  .  guaiFENesin-codeine (CHERATUSSIN AC) 100-10 MG/5ML syrup, Take 5 mLs by mouth every 4 (four) hours as needed for cough., Disp: 180 mL, Rfl: 0 .  Ipratropium-Albuterol (COMBIVENT) 20-100 MCG/ACT AERS respimat, Inhale 1 puff into the lungs as needed for wheezing or shortness of  breath., Disp: 1 Inhaler, Rfl: 11 .  losartan (COZAAR) 50 MG tablet, TAKE 1 TABLET (50 MG TOTAL) BY MOUTH DAILY., Disp: 90 tablet, Rfl: 0 .  montelukast (SINGULAIR) 10 MG tablet, TAKE 1 TABLET AT BEDTIME, Disp: 30 tablet, Rfl: 4 .  Multiple Vitamins-Minerals (CENTRUM SILVER ULTRA MENS) TABS, Take 1 tablet by mouth every morning. Once a day, Disp: , Rfl:  .  polyethylene glycol (MIRALAX / GLYCOLAX) packet, Take 17 g by mouth as needed. , Disp: , Rfl:  .  Probiotic Product (PHILLIPS COLON HEALTH PO), Take 1 capsule by mouth daily. , Disp: , Rfl:  .  pyridOXINE (VITAMIN B-6) 100 MG tablet, Take 100 mg by mouth daily. , Disp: , Rfl:  .  rosuvastatin (CRESTOR) 10 MG tablet, TAKE 1 TABLET (10 MG TOTAL) BY MOUTH DAILY., Disp: 90 tablet, Rfl: 0 .  sildenafil (REVATIO) 20 MG tablet, Take 2-5 tabs prior to sexual activity, Disp: 50 tablet, Rfl: 1 .  Turmeric 500 MG CAPS, Take by mouth every morning., Disp: , Rfl:  .  vitamin B-12 (CYANOCOBALAMIN) 500 MCG tablet, Take 500 mcg by mouth daily., Disp: , Rfl:   Allergies:  Allergies  Allergen Reactions  . Dexlansoprazole Diarrhea and Nausea Only  . Dexlansoprazole Diarrhea and Nausea Only    unknown    Past Medical History, Surgical history, Social history, and  Family History were reviewed and updated.  Review of Systems: As above  Physical Exam:  height is 5\' 11"  (1.803 m) and weight is 237 lb (107.5 kg). His oral temperature is 97.4 F (36.3 C). His blood pressure is 151/72 (abnormal) and his pulse is 75. His respiration is 18.   Well-developed and well-nourished white gentleman. Head and neck exam shows no ocular or oral lesions.  He has no palpable cervical or supraclavicular lymph nodes. Lungs are clear. Cardiac exam regular rate and rhythm with no murmurs rubs or bruits. Abdomen is soft.  He has good bowel sounds. There is no fluid wave. There is no palpable liver or spleen tip. Extremities shows no clubbing cyanosis or edema. Strength is good  bilaterally. Skin exam shows an erythematous-type rash with central clearing in the right inguinal area. There might be a bite mark.  Lab Results  Component Value Date   WBC 4.7 12/26/2015   HGB 14.4 12/26/2015   HCT 45.4 12/26/2015   MCV 82 12/26/2015   PLT 127 (L) 12/26/2015     Chemistry      Component Value Date/Time   NA 140 09/25/2015 1009   K 4.1 09/25/2015 1009   CL 108 09/25/2015 1009   CO2 28 09/25/2015 1009   BUN 23 (H) 09/25/2015 1009   CREATININE 1.1 09/25/2015 1009      Component Value Date/Time   CALCIUM 9.1 09/25/2015 1009   ALKPHOS 56 09/25/2015 1009   AST 27 09/25/2015 1009   ALT 28 09/25/2015 1009   BILITOT 0.90 09/25/2015 1009         Impression and Plan: [name] is a 70 year old gentleman with polycythemia. We will go ahead and phlebotomize him today. It has been a while since he has been phlebotomized.  Fortunately, the rash looks a lot better.  We will plan to get him back in another 3 months.   Volanda Napoleon, MD 9/15/201711:03 AM

## 2015-12-26 NOTE — Patient Instructions (Signed)

## 2015-12-27 ENCOUNTER — Other Ambulatory Visit: Payer: Medicare Other

## 2015-12-27 DIAGNOSIS — I10 Essential (primary) hypertension: Secondary | ICD-10-CM

## 2015-12-27 DIAGNOSIS — E785 Hyperlipidemia, unspecified: Secondary | ICD-10-CM

## 2015-12-27 DIAGNOSIS — Z8639 Personal history of other endocrine, nutritional and metabolic disease: Secondary | ICD-10-CM

## 2015-12-27 DIAGNOSIS — E559 Vitamin D deficiency, unspecified: Secondary | ICD-10-CM | POA: Diagnosis not present

## 2015-12-29 LAB — BMP8+EGFR
BUN/Creatinine Ratio: 19 (ref 10–24)
BUN: 19 mg/dL (ref 8–27)
CO2: 25 mmol/L (ref 18–29)
CREATININE: 0.99 mg/dL (ref 0.76–1.27)
Calcium: 9.2 mg/dL (ref 8.6–10.2)
Chloride: 103 mmol/L (ref 96–106)
GFR calc Af Amer: 88 mL/min/{1.73_m2} (ref >59)
GFR, EST NON AFRICAN AMERICAN: 76 mL/min/{1.73_m2} (ref >59)
GLUCOSE: 111 mg/dL — AB (ref 65–99)
Potassium: 4.8 mmol/L (ref 3.5–5.2)
Sodium: 143 mmol/L (ref 134–144)

## 2015-12-29 LAB — NMR, LIPOPROFILE
CHOLESTEROL: 110 mg/dL (ref 100–199)
HDL CHOLESTEROL BY NMR: 34 mg/dL — AB (ref >39)
HDL PARTICLE NUMBER: 26.9 umol/L — AB (ref >=30.5)
LDL Particle Number: 1002 nmol/L — ABNORMAL HIGH (ref <1000)
LDL Size: 20.1 nm (ref >20.5)
LDL-C: 58 mg/dL (ref 0–99)
LP-IR Score: 64 — ABNORMAL HIGH (ref <=45)
SMALL LDL PARTICLE NUMBER: 686 nmol/L — AB (ref <=527)
TRIGLYCERIDES BY NMR: 90 mg/dL (ref 0–149)

## 2015-12-29 LAB — CBC WITH DIFFERENTIAL/PLATELET
Basophils Absolute: 0 10*3/uL (ref 0.0–0.2)
Basos: 0 %
EOS (ABSOLUTE): 0.2 10*3/uL (ref 0.0–0.4)
EOS: 3 %
HEMATOCRIT: 43.1 % (ref 37.5–51.0)
HEMOGLOBIN: 13.2 g/dL (ref 12.6–17.7)
IMMATURE GRANS (ABS): 0 10*3/uL (ref 0.0–0.1)
Immature Granulocytes: 0 %
LYMPHS ABS: 0.9 10*3/uL (ref 0.7–3.1)
LYMPHS: 18 %
MCH: 25.4 pg — ABNORMAL LOW (ref 26.6–33.0)
MCHC: 30.6 g/dL — AB (ref 31.5–35.7)
MCV: 83 fL (ref 79–97)
MONOCYTES: 7 %
Monocytes Absolute: 0.4 10*3/uL (ref 0.1–0.9)
Neutrophils Absolute: 3.6 10*3/uL (ref 1.4–7.0)
Neutrophils: 72 %
Platelets: 141 10*3/uL — ABNORMAL LOW (ref 150–379)
RBC: 5.2 x10E6/uL (ref 4.14–5.80)
RDW: 16.6 % — ABNORMAL HIGH (ref 12.3–15.4)
WBC: 5.1 10*3/uL (ref 3.4–10.8)

## 2015-12-29 LAB — HEPATIC FUNCTION PANEL
ALT: 21 IU/L (ref 0–44)
AST: 23 IU/L (ref 0–40)
Albumin: 4.2 g/dL (ref 3.5–4.8)
Alkaline Phosphatase: 61 IU/L (ref 39–117)
BILIRUBIN, DIRECT: 0.19 mg/dL (ref 0.00–0.40)
Bilirubin Total: 0.7 mg/dL (ref 0.0–1.2)
TOTAL PROTEIN: 6.2 g/dL (ref 6.0–8.5)

## 2015-12-29 LAB — VITAMIN D 25 HYDROXY (VIT D DEFICIENCY, FRACTURES): Vit D, 25-Hydroxy: 61 ng/mL (ref 30.0–100.0)

## 2015-12-31 ENCOUNTER — Encounter: Payer: Self-pay | Admitting: Family Medicine

## 2015-12-31 ENCOUNTER — Ambulatory Visit (INDEPENDENT_AMBULATORY_CARE_PROVIDER_SITE_OTHER): Payer: Medicare Other | Admitting: Family Medicine

## 2015-12-31 VITALS — BP 131/76 | HR 79 | Temp 97.7°F | Ht 71.0 in | Wt 231.0 lb

## 2015-12-31 DIAGNOSIS — D45 Polycythemia vera: Secondary | ICD-10-CM | POA: Diagnosis not present

## 2015-12-31 DIAGNOSIS — D696 Thrombocytopenia, unspecified: Secondary | ICD-10-CM | POA: Diagnosis not present

## 2015-12-31 DIAGNOSIS — N5201 Erectile dysfunction due to arterial insufficiency: Secondary | ICD-10-CM

## 2015-12-31 DIAGNOSIS — I10 Essential (primary) hypertension: Secondary | ICD-10-CM

## 2015-12-31 DIAGNOSIS — E785 Hyperlipidemia, unspecified: Secondary | ICD-10-CM | POA: Diagnosis not present

## 2015-12-31 DIAGNOSIS — E559 Vitamin D deficiency, unspecified: Secondary | ICD-10-CM | POA: Diagnosis not present

## 2015-12-31 DIAGNOSIS — E291 Testicular hypofunction: Secondary | ICD-10-CM

## 2015-12-31 DIAGNOSIS — E349 Endocrine disorder, unspecified: Secondary | ICD-10-CM

## 2015-12-31 DIAGNOSIS — C44321 Squamous cell carcinoma of skin of nose: Secondary | ICD-10-CM | POA: Diagnosis not present

## 2015-12-31 DIAGNOSIS — D649 Anemia, unspecified: Secondary | ICD-10-CM | POA: Diagnosis not present

## 2015-12-31 DIAGNOSIS — N400 Enlarged prostate without lower urinary tract symptoms: Secondary | ICD-10-CM | POA: Diagnosis not present

## 2015-12-31 DIAGNOSIS — D509 Iron deficiency anemia, unspecified: Secondary | ICD-10-CM | POA: Diagnosis not present

## 2015-12-31 DIAGNOSIS — J449 Chronic obstructive pulmonary disease, unspecified: Secondary | ICD-10-CM

## 2015-12-31 MED ORDER — SILDENAFIL CITRATE 20 MG PO TABS: ORAL_TABLET | ORAL | 1 refills | Status: DC

## 2015-12-31 MED ORDER — IPRATROPIUM-ALBUTEROL 20-100 MCG/ACT IN AERS: 1.0000 | INHALATION_SPRAY | RESPIRATORY_TRACT | 11 refills | Status: DC | PRN

## 2015-12-31 NOTE — Patient Instructions (Signed)
Medicare Annual Wellness Visit  [place] and the medical providers at [facility] strive to bring you the best medical care.  In doing so we not only want to address your current medical conditions and concerns but also to detect new conditions early and prevent illness, disease and health-related problems.    Medicare offers a yearly Wellness Visit which allows our clinical staff to assess your need for preventative services including immunizations, lifestyle education, counseling to decrease risk of preventable diseases and screening for fall risk and other medical concerns.    This visit is provided free of charge (no copay) for all Medicare recipients. The clinical pharmacists at [facility] have begun to conduct these Wellness Visits which will also include a thorough review of all your medications.    As you primary medical provider recommend that you make an appointment for your Annual Wellness Visit if you have not done so already this year.  You may set up this appointment before you leave today or you may call back ([phone]) and schedule an appointment.  Please make sure when you call that you mention that you are scheduling your Annual Wellness Visit with the clinical pharmacist so that the appointment may be made for the proper length of time.     Continue current medications. Continue good therapeutic lifestyle changes which include good diet and exercise. Fall precautions discussed with patient. If an FOBT was given today- please return it to our front desk. If you are over 50 years old - you may need Prevnar 13 or the adult Pneumonia vaccine.  **Flu shots are available--- please call and schedule a FLU-CLINIC appointment**  After your visit with us today you will receive a survey in the mail or online from [employer] regarding your care with us. Please take a moment to fill this out. Your feedback is very  important to us as you can help us better understand your patient needs as well as improve your experience and satisfaction. WE CARE ABOUT YOU!!!    

## 2015-12-31 NOTE — Progress Notes (Signed)
Subjective:    Patient ID: Connor [name], male    DOB: 03/16/1946, 70 y.o.   MRN: [id number]  HPI Pt here for follow up and management of chronic medical problems which includes anemia, hyperlipidemia and hypertension. He is taking medications regularly.This patient is doing well overall. He is followed regularly by his hematologist. He is requesting refills on several medications which we will do. He sees the urologist every 6 months. He is going to wait on his flu shot. He is had lab work done and we will review this with him during the visit today. Overall, cholesterol numbers were slightly increased but not by much. Liver function tests were good. The blood sugar was slightly elevated at 111 but the creatinine was good. Liver function and vitamin D were good. The patient is pleasant and doing well. He denies any chest pain shortness of breath trouble swallowing heartburn indigestion nausea vomiting diarrhea or blood in the stool. He is passing his water well and continues to take his Uroxatral from the urologist and this helps with voiding. He sees the hematologist about every 3 months and recently had a phlebotomy. He is up-to-date on his eye exams and his colonoscopies. He continues to take a proton pump inhibitor and has not tried an H2 blocker recently and we'll consider trying this to see if he can tolerate H2 blocker without symptoms.     Patient Active Problem List   Diagnosis Date Noted  . Hyperlipidemia LDL goal <100 04/12/2014  . Polycythemia vera ([place]) 04/19/2013  . Family history of early CAD 02/19/2013  . Iron deficiency anemia 09/01/2012  . H/O vitamin D deficiency 05/21/2012  . Diverticulosis of colon 05/21/2012  . Carotid artery stenosis, asymptomatic 05/21/2012  . BPH (benign prostatic hyperplasia)   . Cardiomegaly   . Thrombocytopenia ([place])   . Lap Nissen Fundoplication August 0000000 11/26/2011  . Weight gain, abnormal 08/27/2009  . ESSENTIAL HYPERTENSION, BENIGN  08/27/2009  . OSA (obstructive sleep apnea) 02/19/2008  . COPD, mild ([place]) 05/26/2007   Outpatient Encounter Prescriptions as of 12/31/2015  Medication Sig  . alfuzosin (UROXATRAL) 10 MG 24 hr tablet Take 10 mg by mouth every morning.   [name] aspirin 81 MG tablet Take 81 mg by mouth 2 (two) times daily.   . Cholecalciferol (VITAMIN D-3) 5000 UNITS TABS Take 1 tablet by mouth daily.   . Coenzyme Q10 (CO Q 10 PO) Take 1 capsule by mouth daily.   . cyclobenzaprine (FLEXERIL) 10 MG tablet Take 1 tablet (10 mg total) by mouth 3 (three) times daily as needed for muscle spasms.  . fish oil-omega-3 fatty acids 1000 MG capsule Take 2 g by mouth daily.   . Glucosamine-Chondroit-Vit C-Mn (GLUCOSAMINE CHONDR 1500 COMPLX) CAPS Take by mouth every morning.  . Ipratropium-Albuterol (COMBIVENT) 20-100 MCG/ACT AERS respimat Inhale 1 puff into the lungs as needed for wheezing or shortness of breath.  . losartan (COZAAR) 50 MG tablet TAKE 1 TABLET (50 MG TOTAL) BY MOUTH DAILY.  . montelukast (SINGULAIR) 10 MG tablet TAKE 1 TABLET AT BEDTIME  . Multiple Vitamins-Minerals (CENTRUM SILVER ULTRA MENS) TABS Take 1 tablet by mouth every morning. Once a day  . polyethylene glycol (MIRALAX / GLYCOLAX) packet Take 17 g by mouth as needed.   . Probiotic Product (PHILLIPS COLON HEALTH PO) Take 1 capsule by mouth daily.   [name] pyridOXINE (VITAMIN B-6) 100 MG tablet Take 100 mg by mouth daily.   . rosuvastatin (CRESTOR) 10 MG tablet TAKE 1  TABLET (10 MG TOTAL) BY MOUTH DAILY.  . sildenafil (REVATIO) 20 MG tablet Take 2-5 tabs prior to sexual activity  . Turmeric 500 MG CAPS Take by mouth every morning.  . vitamin B-12 (CYANOCOBALAMIN) 500 MCG tablet Take 500 mcg by mouth daily.  . [DISCONTINUED] guaiFENesin-codeine (CHERATUSSIN AC) 100-10 MG/5ML syrup Take 5 mLs by mouth every 4 (four) hours as needed for cough.  . [DISCONTINUED] Ipratropium-Albuterol (COMBIVENT) 20-100 MCG/ACT AERS respimat Inhale 1 puff into the lungs as needed  for wheezing or shortness of breath.  . [DISCONTINUED] sildenafil (REVATIO) 20 MG tablet Take 2-5 tabs prior to sexual activity   No facility-administered encounter medications on file as of 12/31/2015.      Review of Systems  Constitutional: Negative.   HENT: Negative.   Eyes: Negative.   Respiratory: Negative.   Cardiovascular: Negative.   Gastrointestinal: Negative.   Endocrine: Negative.   Genitourinary: Negative.   Musculoskeletal: Negative.   Skin: Negative.   Allergic/Immunologic: Negative.   Neurological: Negative.   Hematological: Negative.   Psychiatric/Behavioral: Negative.        Objective:   Physical Exam  Constitutional: He is oriented to person, place, and time. He appears well-developed and well-nourished. No distress.  HENT:  Head: Normocephalic and atraumatic.  Right Ear: External ear normal.  Left Ear: External ear normal.  Nose: Nose normal.  Mouth/Throat: Oropharynx is clear and moist. No oropharyngeal exudate.  Eyes: Conjunctivae and EOM are normal. Pupils are equal, round, and reactive to light. Right eye exhibits no discharge. Left eye exhibits no discharge. No scleral icterus.  Neck: Normal range of motion. Neck supple. No thyromegaly present.  Cardiovascular: Normal rate, regular rhythm, normal heart sounds and intact distal pulses.   No murmur heard. Heart is regular at 72/m  Pulmonary/Chest: Effort normal and breath sounds normal. No respiratory distress. He has no wheezes. He has no rales. He exhibits no tenderness.  Abdominal: Soft. Bowel sounds are normal. He exhibits no mass. There is no tenderness. There is no rebound and no guarding.  Clear anteriorly and posteriorly  Genitourinary:  Genitourinary Comments: The patient is followed regularly by his urologist for this exam  Musculoskeletal: Normal range of motion. He exhibits no edema.  Lymphadenopathy:    He has no cervical adenopathy.  Neurological: He is alert and oriented to person,  place, and time. He has normal reflexes. No cranial nerve deficit.  Skin: Skin is warm and dry. No rash noted.  Psychiatric: He has a normal mood and affect. His behavior is normal. Judgment and thought content normal.  Nursing note and vitals reviewed.  BP 131/76 (BP Location: Right Arm)   Pulse 79   Temp 97.7 F (36.5 C) (Oral)   Ht 5\' 11"  (1.803 m)   Wt 231 lb (104.8 kg)   BMI 32.22 kg/m        Assessment & Plan:  1. Thrombocytopenia ([place]) -The patient continues to see the hematologist regularly and has periodic phlebotomies. - Ipratropium-Albuterol (COMBIVENT) 20-100 MCG/ACT AERS respimat; Inhale 1 puff into the lungs as needed for wheezing or shortness of breath.  Dispense: 1 Inhaler; Refill: 11  2. COPD, mild ([place]) -Continue with inhalers and avoid irritating environments  3. Essential hypertension, benign -Continue with current treatment  4. BPH (benign prostatic hyperplasia) -Continue follow-up with urology  5. Hyperlipidemia -Work as aggressively as possible on therapeutic lifestyle changes and continue with current treatment  6. Vitamin D deficiency -Continue with current treatment  7. Iron deficiency anemia -Continue  follow-up with hematology  8. Anemia, unspecified anemia type -Continue follow-up with hematology - Ipratropium-Albuterol (COMBIVENT) 20-100 MCG/ACT AERS respimat; Inhale 1 puff into the lungs as needed for wheezing or shortness of breath.  Dispense: 1 Inhaler; Refill: 11  9. Erectile dysfunction due to arterial insufficiency -Check testosterone level and continue with sildenafil  10. Squamous cell cancer of skin of nose -Continue follow-up with dermatology as needed  11. Polycythemia vera ([place]) -Follow-up with hematology  12. Testosterone deficiency -Continue to follow-up with urology - Testosterone,Free and Total  Meds ordered this encounter  Medications  . sildenafil (REVATIO) 20 MG tablet    Sig: Take 2-5 tabs prior to sexual  activity    Dispense:  50 tablet    Refill:  1  . Ipratropium-Albuterol (COMBIVENT) 20-100 MCG/ACT AERS respimat    Sig: Inhale 1 puff into the lungs as needed for wheezing or shortness of breath.    Dispense:  1 Inhaler    Refill:  11   Patient Instructions                       Medicare Annual Wellness Visit  [facility] and the medical providers at [facility] strive to bring you the best medical care.  In doing so we not only want to address your current medical conditions and concerns but also to detect new conditions early and prevent illness, disease and health-related problems.    Medicare offers a yearly Wellness Visit which allows our clinical staff to assess your need for preventative services including immunizations, lifestyle education, counseling to decrease risk of preventable diseases and screening for fall risk and other medical concerns.    This visit is provided free of charge (no copay) for all Medicare recipients. The clinical pharmacists at [facility] have begun to conduct these Wellness Visits which will also include a thorough review of all your medications.    As you primary medical provider recommend that you make an appointment for your Annual Wellness Visit if you have not done so already this year.  You may set up this appointment before you leave today or you may call back [phone] and schedule an appointment.  Please make sure when you call that you mention that you are scheduling your Annual Wellness Visit with the clinical pharmacist so that the appointment may be made for the proper length of time.     Continue current medications. Continue good therapeutic lifestyle changes which include good diet and exercise. Fall precautions discussed with patient. If an FOBT was given today- please return it to our front desk. If you are over 80 years old - you may need Prevnar 19 or the adult Pneumonia vaccine.  **Flu  shots are available--- please call and schedule a FLU-CLINIC appointment**  After your visit with [place] today you will receive a survey in the mail or online from [employer] regarding your care with [place]. Please take a moment to fill this out. Your feedback is very important to [place] as you can help [place] better understand your patient needs as well as improve your experience and satisfaction. WE CARE ABOUT YOU!!!     Arrie Senate MD

## 2016-01-01 LAB — TESTOSTERONE,FREE AND TOTAL
TESTOSTERONE FREE: 5.4 pg/mL — AB (ref 6.6–18.1)
TESTOSTERONE: 341 ng/dL (ref 264–916)

## 2016-02-18 ENCOUNTER — Encounter: Payer: Self-pay | Admitting: Pulmonary Disease

## 2016-02-18 ENCOUNTER — Ambulatory Visit (INDEPENDENT_AMBULATORY_CARE_PROVIDER_SITE_OTHER): Payer: Medicare Other | Admitting: Pulmonary Disease

## 2016-02-18 VITALS — BP 112/78 | HR 79 | Ht 71.5 in | Wt 239.0 lb

## 2016-02-18 DIAGNOSIS — Z9989 Dependence on other enabling machines and devices: Secondary | ICD-10-CM | POA: Diagnosis not present

## 2016-02-18 DIAGNOSIS — J449 Chronic obstructive pulmonary disease, unspecified: Secondary | ICD-10-CM | POA: Diagnosis not present

## 2016-02-18 DIAGNOSIS — G4733 Obstructive sleep apnea (adult) (pediatric): Secondary | ICD-10-CM

## 2016-02-18 NOTE — Progress Notes (Signed)
Current Outpatient Prescriptions on File Prior to Visit  Medication Sig  . alfuzosin (UROXATRAL) 10 MG 24 hr tablet Take 10 mg by mouth every morning.   [name] aspirin 81 MG tablet Take 81 mg by mouth 2 (two) times daily.   . Cholecalciferol (VITAMIN D-3) 5000 UNITS TABS Take 1 tablet by mouth daily.   . Coenzyme Q10 (CO Q 10 PO) Take 1 capsule by mouth daily.   . cyclobenzaprine (FLEXERIL) 10 MG tablet Take 1 tablet (10 mg total) by mouth 3 (three) times daily as needed for muscle spasms.  . fish oil-omega-3 fatty acids 1000 MG capsule Take 2 g by mouth daily.   . Glucosamine-Chondroit-Vit C-Mn (GLUCOSAMINE CHONDR 1500 COMPLX) CAPS Take by mouth every morning.  . Ipratropium-Albuterol (COMBIVENT) 20-100 MCG/ACT AERS respimat Inhale 1 puff into the lungs as needed for wheezing or shortness of breath.  . losartan (COZAAR) 50 MG tablet TAKE 1 TABLET (50 MG TOTAL) BY MOUTH DAILY.  . montelukast (SINGULAIR) 10 MG tablet TAKE 1 TABLET AT BEDTIME  . Multiple Vitamins-Minerals (CENTRUM SILVER ULTRA MENS) TABS Take 1 tablet by mouth every morning. Once a day  . polyethylene glycol (MIRALAX / GLYCOLAX) packet Take 17 g by mouth as needed.   . Probiotic Product (PHILLIPS COLON HEALTH PO) Take 1 capsule by mouth daily.   [name] pyridOXINE (VITAMIN B-6) 100 MG tablet Take 100 mg by mouth daily.   . rosuvastatin (CRESTOR) 10 MG tablet TAKE 1 TABLET (10 MG TOTAL) BY MOUTH DAILY.  . sildenafil (REVATIO) 20 MG tablet Take 2-5 tabs prior to sexual activity  . Turmeric 500 MG CAPS Take by mouth every morning.  . vitamin B-12 (CYANOCOBALAMIN) 500 MCG tablet Take 500 mcg by mouth daily.   No current facility-administered medications on file prior to visit.     Chief Complaint  Patient presents with  . Follow-up    Pt wears CPAP nightly. Denies problems with mask/pressure. DME: [facility] ; Pt reports that breathing is unchanged. Still restricted to activity he can do d/t SOB    Sleep tests PSG 06/08/04 >> AHI 29, SpO2  84% Auto-CPAP download 08/10/10 to 08/30/10>>Used on 21 of 21 nights with average 6hrs 46 min.  Average AHI 1.6 with optimal pressure 10 cm H2O. CPAP 10/09/12 to 11/07/12 >> Used on 29 of 30 nights with average 6 hrs 44 min. CPAP 10 cm H2O. ONO with RA 12/05/13 >> Test time 7 hrs 47 min. Mean SpO2 92.8%, low SpO2 89%.  Pulmonary tests PFT 09/09/11>>FEV1 3.06 (95%), FEV1% 69, FEF 25-75% 1.70 (59%), TLC 7.67 (109%), DLCO 108%, no BD  Cardiac tests Echo 02/11/14 >> mod LVH, EF 50 to [id number], grade 1 diastolic dysfx  Past medical history HLD, PAD, GERD s/p Nissen fundoplication, ED, BPH, Nephrolithiasis  Past surgical history, Family history, Social history, Allergies reviewed  Vital signs BP 112/78 (BP Location: Left Arm, Cuff Size: Normal)   Pulse 79   Ht 5' 11.5" (1.816 m)   Wt 239 lb (108.4 kg)   SpO2 95%   BMI 32.87 kg/m   History of Present Illness: Connor [name] is a 70 y.o. male former smoker with OSA and chronic cough with borderline COPD.  He has been doing well with CPAP.  No issue with mask fit >> uses full face mask.  Gets about 8 hrs sleep at night.  Will nap for about an hour >> doesn't use CPAP then.  Breathing okay.  Pretreats himself with combivent before yard work.  Otherwise denies cough, wheeze, sputum, chest pain, palpitations, leg swelling.  He has flu shot scheduled for later this month.   Physical Exam:  General - pleasant HEENT - wears glasses, no sinus tenderness, clear nasal discharge, narrow nasal angles, MP 2, no exudate, no LAN Cardiac - S1S2 regular, no murmur Chest - no wheeze Abd - soft, nontender, normal bowel sounds  Ext - no edema Neuro - normal strength  Psych - normal behavior and mood  Assessment/Plan:  Obstructive sleep apnea. - He is compliant with therapy and reports benefit from CPAP - continue CPAP 10 cm H2O  COPD. - continue prn combivent   Patient Instructions  Follow up in 1 year    Chesley Mires, MD [place] 02/18/2016, 2:48 PM Pager:  [phone]

## 2016-02-18 NOTE — Patient Instructions (Signed)
Follow up in 1 year.

## 2016-02-25 ENCOUNTER — Ambulatory Visit (INDEPENDENT_AMBULATORY_CARE_PROVIDER_SITE_OTHER): Payer: Medicare Other

## 2016-02-25 DIAGNOSIS — Z23 Encounter for immunization: Secondary | ICD-10-CM | POA: Diagnosis not present

## 2016-03-06 ENCOUNTER — Other Ambulatory Visit: Payer: Self-pay | Admitting: Family Medicine

## 2016-03-24 DIAGNOSIS — Z8529 Personal history of malignant neoplasm of other respiratory and intrathoracic organs: Secondary | ICD-10-CM | POA: Diagnosis not present

## 2016-03-24 DIAGNOSIS — M898X8 Other specified disorders of bone, other site: Secondary | ICD-10-CM | POA: Diagnosis not present

## 2016-03-24 DIAGNOSIS — M270 Developmental disorders of jaws: Secondary | ICD-10-CM | POA: Diagnosis not present

## 2016-03-24 DIAGNOSIS — Z87891 Personal history of nicotine dependence: Secondary | ICD-10-CM | POA: Diagnosis not present

## 2016-03-24 DIAGNOSIS — C300 Malignant neoplasm of nasal cavity: Secondary | ICD-10-CM | POA: Diagnosis not present

## 2016-03-26 ENCOUNTER — Ambulatory Visit (HOSPITAL_BASED_OUTPATIENT_CLINIC_OR_DEPARTMENT_OTHER): Payer: Medicare Other | Admitting: Hematology & Oncology

## 2016-03-26 ENCOUNTER — Encounter: Payer: Self-pay | Admitting: *Deleted

## 2016-03-26 ENCOUNTER — Other Ambulatory Visit (HOSPITAL_BASED_OUTPATIENT_CLINIC_OR_DEPARTMENT_OTHER): Payer: Medicare Other

## 2016-03-26 VITALS — BP 142/66 | Temp 97.8°F | Resp 18

## 2016-03-26 DIAGNOSIS — D751 Secondary polycythemia: Secondary | ICD-10-CM

## 2016-03-26 DIAGNOSIS — D45 Polycythemia vera: Secondary | ICD-10-CM | POA: Diagnosis present

## 2016-03-26 DIAGNOSIS — D696 Thrombocytopenia, unspecified: Secondary | ICD-10-CM

## 2016-03-26 LAB — CBC WITH DIFFERENTIAL (CANCER CENTER ONLY)
BASO#: 0 10*3/uL (ref 0.0–0.2)
BASO%: 0.5 % (ref 0.0–2.0)
EOS ABS: 0.1 10*3/uL (ref 0.0–0.5)
EOS%: 2.3 % (ref 0.0–7.0)
HEMATOCRIT: 43.5 % (ref 38.7–49.9)
HGB: 13.7 g/dL (ref 13.0–17.1)
LYMPH#: 0.9 10*3/uL (ref 0.9–3.3)
LYMPH%: 20.5 % (ref 14.0–48.0)
MCH: 25.1 pg — AB (ref 28.0–33.4)
MCHC: 31.5 g/dL — ABNORMAL LOW (ref 32.0–35.9)
MCV: 80 fL — AB (ref 82–98)
MONO#: 0.3 10*3/uL (ref 0.1–0.9)
MONO%: 7.2 % (ref 0.0–13.0)
NEUT#: 3 10*3/uL (ref 1.5–6.5)
NEUT%: 69.5 % (ref 40.0–80.0)
RBC: 5.45 10*6/uL (ref 4.20–5.70)
RDW: 15.2 % (ref 11.1–15.7)
WBC: 4.3 10*3/uL (ref 4.0–10.0)

## 2016-03-26 LAB — IRON AND TIBC
%SAT: 13 % — AB (ref 20–55)
Iron: 55 ug/dL (ref 42–163)
TIBC: 413 ug/dL — AB (ref 202–409)
UIBC: 358 ug/dL (ref 117–376)

## 2016-03-26 LAB — FERRITIN: Ferritin: 8 ng/ml — ABNORMAL LOW (ref 22–316)

## 2016-03-26 NOTE — Progress Notes (Signed)
Hematology and Oncology Follow Up Visit  Connor Connor [name] [id number] 1945-11-30 70 y.o. 03/26/2016   Principle Diagnosis:   Polycythemia vera-Triple negative  Chronic immune thrombocytopenia vs medication  Current Therapy:    Phlebotomy to maintain hematocrit below 45%  Aspirin 81 mg by mouth daily     Interim History:  Connor Connor [name] is back for followup. He feels pretty well. He really has had no specific complaints.   His wife, unfortunately, will need knee surgery the day after Christmas. It will be her left knee. She will have arthroscopic surgery.  Next year will be their 50th anniversary. They will be going back to [place]. This will be in November.  He's had no headache. He's had no pruritus. He's had no rashes. There's been no cough. She had no change in bowel or bladder habits.  He's had no leg swelling. He's had no change in medications.  Overall, his performance status is ECOG 1.   Medications:  Current Outpatient Prescriptions:  .  alfuzosin (UROXATRAL) 10 MG 24 hr tablet, Take 10 mg by mouth every morning. , Disp: , Rfl:  .  aspirin 81 MG tablet, Take 81 mg by mouth 2 (two) times daily. , Disp: , Rfl:  .  Cholecalciferol (VITAMIN D-3) 5000 UNITS TABS, Take 1 tablet by mouth daily. , Disp: , Rfl:  .  Coenzyme Q10 (CO Q 10 PO), Take 1 capsule by mouth daily. , Disp: , Rfl:  .  cyclobenzaprine (FLEXERIL) 10 MG tablet, Take 1 tablet (10 mg total) by mouth 3 (three) times daily as needed for muscle spasms., Disp: 30 tablet, Rfl: 1 .  fish oil-omega-3 fatty acids 1000 MG capsule, Take 2 g by mouth daily. , Disp: , Rfl:  .  Glucosamine-Chondroit-Vit C-Mn (GLUCOSAMINE CHONDR 1500 COMPLX) CAPS, Take by mouth every morning., Disp: , Rfl:  .  Ipratropium-Albuterol (COMBIVENT) 20-100 MCG/ACT AERS respimat, Inhale 1 puff into the lungs as needed for wheezing or shortness of breath., Disp: 1 Inhaler, Rfl: 11 .  losartan (COZAAR) 50 MG tablet, TAKE 1 TABLET (50 MG TOTAL) BY MOUTH  DAILY., Disp: 90 tablet, Rfl: 0 .  montelukast (SINGULAIR) 10 MG tablet, TAKE 1 TABLET AT BEDTIME, Disp: 30 tablet, Rfl: 4 .  Multiple Vitamins-Minerals (CENTRUM SILVER ULTRA MENS) TABS, Take 1 tablet by mouth every morning. Once a day, Disp: , Rfl:  .  polyethylene glycol (MIRALAX / GLYCOLAX) packet, Take 17 g by mouth as needed. , Disp: , Rfl:  .  Probiotic Product (PHILLIPS COLON HEALTH PO), Take 1 capsule by mouth daily. , Disp: , Rfl:  .  pyridOXINE (VITAMIN B-6) 100 MG tablet, Take 100 mg by mouth daily. , Disp: , Rfl:  .  rosuvastatin (CRESTOR) 10 MG tablet, TAKE 1 TABLET (10 MG TOTAL) BY MOUTH DAILY., Disp: 90 tablet, Rfl: 0 .  sildenafil (REVATIO) 20 MG tablet, Take 2-5 tabs prior to sexual activity, Disp: 50 tablet, Rfl: 1 .  Turmeric 500 MG CAPS, Take by mouth every morning., Disp: , Rfl:  .  vitamin B-12 (CYANOCOBALAMIN) 500 MCG tablet, Take 500 mcg by mouth daily., Disp: , Rfl:   Allergies:  Allergies  Allergen Reactions  . Dexlansoprazole Diarrhea and Nausea Only  . Dexlansoprazole Diarrhea and Nausea Only    unknown    Past Medical History, Surgical history, Social history, and Family History were reviewed and updated.  Review of Systems: As above  Physical Exam:  oral temperature is 97.8 F (36.6 C). His blood pressure  is 142/66 (abnormal). His respiration is 18 and oxygen saturation is 98%.   Well-developed and well-nourished white gentleman. Head and neck exam shows no ocular or oral lesions.  He has no palpable cervical or supraclavicular lymph nodes. Lungs are clear. Cardiac exam regular rate and rhythm with no murmurs rubs or bruits. Abdomen is soft.  He has good bowel sounds. There is no fluid wave. There is no palpable liver or spleen tip. Extremities shows no clubbing cyanosis or edema. Strength is good bilaterally. Skin exam shows an erythematous-type rash with central clearing in the right inguinal area. There might be a bite mark.  Lab Results  Component Value  Date   WBC 4.3 03/26/2016   HGB 13.7 03/26/2016   HCT 43.5 03/26/2016   MCV 80 (L) 03/26/2016   PLT 117 Platelet count consistent in citrate (L) 03/26/2016     Chemistry      Component Value Date/Time   NA 143 12/27/2015 0901   NA 144 12/26/2015 1010   K 4.8 12/27/2015 0901   K 3.9 12/26/2015 1010   CL 103 12/27/2015 0901   CL 108 09/25/2015 1009   CO2 25 12/27/2015 0901   CO2 26 12/26/2015 1010   BUN 19 12/27/2015 0901   BUN 18.2 12/26/2015 1010   CREATININE 0.99 12/27/2015 0901   CREATININE 1.0 12/26/2015 1010      Component Value Date/Time   CALCIUM 9.2 12/27/2015 0901   CALCIUM 9.4 12/26/2015 1010   ALKPHOS 61 12/27/2015 0901   ALKPHOS 73 12/26/2015 1010   AST 23 12/27/2015 0901   AST 32 12/26/2015 1010   ALT 21 12/27/2015 0901   ALT 29 12/26/2015 1010   BILITOT 0.7 12/27/2015 0901   BILITOT 0.92 12/26/2015 1010         Impression and Plan: [name] is a 70 year old gentleman with polycythemia.He does not need to be phlebotomized today. I'm very happy about this.  We will plan for 2 month follow-up area and I want to make sure that we see him a little bit sooner so that his blood does not get too high.   Volanda Napoleon, MD 12/15/20179:23 AM

## 2016-05-06 ENCOUNTER — Ambulatory Visit (INDEPENDENT_AMBULATORY_CARE_PROVIDER_SITE_OTHER): Payer: Medicare Other | Admitting: Cardiovascular Disease

## 2016-05-06 ENCOUNTER — Encounter: Payer: Self-pay | Admitting: Cardiovascular Disease

## 2016-05-06 VITALS — BP 126/68 | HR 83 | Ht 71.5 in | Wt 241.8 lb

## 2016-05-06 DIAGNOSIS — G4733 Obstructive sleep apnea (adult) (pediatric): Secondary | ICD-10-CM | POA: Diagnosis not present

## 2016-05-06 DIAGNOSIS — D509 Iron deficiency anemia, unspecified: Secondary | ICD-10-CM

## 2016-05-06 DIAGNOSIS — E785 Hyperlipidemia, unspecified: Secondary | ICD-10-CM | POA: Diagnosis not present

## 2016-05-06 DIAGNOSIS — R972 Elevated prostate specific antigen [PSA]: Secondary | ICD-10-CM | POA: Diagnosis not present

## 2016-05-06 DIAGNOSIS — I10 Essential (primary) hypertension: Secondary | ICD-10-CM | POA: Diagnosis not present

## 2016-05-06 DIAGNOSIS — D751 Secondary polycythemia: Secondary | ICD-10-CM

## 2016-05-06 DIAGNOSIS — E669 Obesity, unspecified: Secondary | ICD-10-CM

## 2016-05-06 NOTE — Patient Instructions (Signed)
Your physician wants you to follow-up in: 1 year or sooner if needed. You will receive a reminder letter in the mail two months in advance. If you don't receive a letter, please call our office to schedule the follow-up appointment.   If you need a refill on your cardiac medications before your next appointment, please call your pharmacy.   

## 2016-05-07 NOTE — Progress Notes (Signed)
Patient ID: KEMOND AMORIN, male   DOB: [birth year], 71 y.o.   MRN: [id number]    HPI: Connor [name] is a 71 y.o. male who presents to the office for one year cardiology evaluation.   Mr. Briseno has a history of significant hyperlipidemia and remotely had been on combination therapy with Niaspan for some time and had taken Zetia as well as Crestor.  He has a strong family history for coronary artery disease and 2 of his younger brothers have had previous  coronary artery stenting. He  has a history of GERD and is status post laparoscopic Nissen fundoplication by [name] Done due to hiatal hernia severe GERD. An echo Doppler study in November 2013 showed an ejection fraction > 55% with grade 1 diastolic dysfunction. He had mild left atrial enlargement and evidence for mild aortic valve sclerosis. He underwent carotid duplex imaging which was normal. A nuclear perfusion study showed normal perfusion and function.  When I saw him 3 years ago he had microcytic indices and was found to be iron deficient. He also subsequently developed thrombocytopenia and has been under the care of [name].  He has been diagnosed with polycythemia vera-JAK2 negative and chronic immune thrombocytopenia.  He apparently did receive IV iron therapy, and underwent ultrasound of the spleen as well as bone marrow. He also has undergone colonoscopy.  He sees Dr. Lutricia Feil on a three-month basis and apparently his hematologic status has been stable.  He is on CPAP therapy for obstructive sleep apnea.  A follow-up echo Doppler study on 02/11/2014 showed an ejection fraction at 50-55% without regional wall motion abnormalities, but with continued grade 1 diastolic dysfunction.  The left atrium was mildly dilated.   Over the past year, he denies chest pain, PND orthopnea. He is now on Crestor 10 mg N omega-3 fatty acids 2 grams daily for his hyperlipidemia.  He is on losartan 50 mg daily for hypertension.  He typically gives blood every  2-3 months at the [facility] for his polycythemia.   He has continued to use CPAP with 100% compliance.  He denies any breakthrough snoring.  His sleep is restorative.  There is no daytime sleepiness.  Recent blood work one month ago has shown persistent iron deficiency with 13% saturation and ferritin level.  8.  Hemoglobin 13.7, crit 43.5.  Platelet count was 117,000.   He presents for one-year follow-up cardiology evaluation.  Past Medical History:  Diagnosis Date  . Anemia   . BPH (benign prostatic hyperplasia)   . Cancer (HCC)    squamous cell on nose , inside nose   . Cardiomegaly   . Carotid artery stenosis   . COPD, mild ([place]) 05/26/2007   PFT 09/09/11>>FEV1 3.06 (95%), FEV1% 69, FEF 25-75% 1.70 (59%), TLC 7.67 (109%), DLCO 108%, no BD    . Cough 05/26/2007  . Erectile dysfunction   . GERD (gastroesophageal reflux disease)   . H/O vitamin D deficiency   . History of nuclear stress test    this revealed normal myocardial perfusion and function. Post stress EF was 57%. There was no region of scar or ischemia.  [name] Hx of echocardiogram 02/15/2012   This showed mild concentric LVH. EF > 55%. H edid have grade 1 diastolic dysfunction with mitral valve E:A ratio of 0.81, his left atrium was mildly dilated by volume assessment at 33.3 mL/m2. He did have mild tricupid regurgitation with upper normal RV systolic pressure at 89QJ. He had aortic valve sclerosis without  stenosis.  . Hyperlipemia   . Iron deficiency anemia, unspecified 09/01/2012  . Nephrolithiasis   . Peyronie's disease   . Polycythemia vera(238.4) 04/19/2013  . Shortness of breath    with exertion   . Sleep apnea    Cpap-   . Thrombocytopenia ([place])     Past Surgical History:  Procedure Laterality Date  . APPENDECTOMY    . HIATAL HERNIA REPAIR  11/24/2011   Procedure: LAPAROSCOPIC REPAIR OF HIATAL HERNIA;  Surgeon: Pedro Earls, MD;  Location: [facility];  Service: General;;  . KNEE SURGERY     right  . LAPAROSCOPIC  NISSEN FUNDOPLICATION  6/59/9357   Procedure: LAPAROSCOPIC NISSEN FUNDOPLICATION;  Surgeon: Pedro Earls, MD;  Location: [facility];  Service: General;  Laterality: N/A;  . NOSE SURGERY  2013  . SHOULDER SURGERY     left    Allergies  Allergen Reactions  . Dexlansoprazole Diarrhea and Nausea Only  . Dexlansoprazole Diarrhea and Nausea Only    unknown    Current Outpatient Prescriptions  Medication Sig Dispense Refill  . alfuzosin (UROXATRAL) 10 MG 24 hr tablet Take 10 mg by mouth every morning.     [name] aspirin 81 MG tablet Take 81 mg by mouth 2 (two) times daily.     . Cholecalciferol (VITAMIN D-3) 5000 UNITS TABS Take 1 tablet by mouth daily.     . Coenzyme Q10 (CO Q 10 PO) Take 1 capsule by mouth daily.     . cyclobenzaprine (FLEXERIL) 10 MG tablet Take 1 tablet (10 mg total) by mouth 3 (three) times daily as needed for muscle spasms. 30 tablet 1  . fish oil-omega-3 fatty acids 1000 MG capsule Take 2 g by mouth daily.     . Glucosamine-Chondroit-Vit C-Mn (GLUCOSAMINE CHONDR 1500 COMPLX) CAPS Take by mouth every morning.    . Ipratropium-Albuterol (COMBIVENT) 20-100 MCG/ACT AERS respimat Inhale 1 puff into the lungs as needed for wheezing or shortness of breath. 1 Inhaler 11  . losartan (COZAAR) 50 MG tablet TAKE 1 TABLET (50 MG TOTAL) BY MOUTH DAILY. 90 tablet 0  . montelukast (SINGULAIR) 10 MG tablet TAKE 1 TABLET AT BEDTIME 30 tablet 4  . Multiple Vitamins-Minerals (CENTRUM SILVER ULTRA MENS) TABS Take 1 tablet by mouth every morning. Once a day    . polyethylene glycol (MIRALAX / GLYCOLAX) packet Take 17 g by mouth as needed.     . Probiotic Product (PHILLIPS COLON HEALTH PO) Take 1 capsule by mouth daily.     [name] pyridOXINE (VITAMIN B-6) 100 MG tablet Take 100 mg by mouth daily.     . rosuvastatin (CRESTOR) 10 MG tablet TAKE 1 TABLET (10 MG TOTAL) BY MOUTH DAILY. 90 tablet 0  . sildenafil (REVATIO) 20 MG tablet Take 2-5 tabs prior to sexual activity 50 tablet 1  . Turmeric 500 MG  CAPS Take by mouth every morning.    . vitamin B-12 (CYANOCOBALAMIN) 500 MCG tablet Take 500 mcg by mouth daily.     No current facility-administered medications for this visit.     Social History   Social History  . Marital status: Married    Spouse name: N/A  . Number of children: 3  . Years of education: N/A   Occupational History  . retired Retired   Social History Main Topics  . Smoking status: Former Smoker    Packs/day: 0.25    Years: 11.00    Types: Cigarettes    Start date: 05/17/1958  Quit date: 04/12/1966  . Smokeless tobacco: Never Used     Comment: quit smoking in 1968  . Alcohol use 0.0 oz/week     Comment: seldom  . Drug use: No  . Sexual activity: Not on file   Other Topics Concern  . Not on file   Social History Narrative  . No narrative on file    Family History  Problem Relation Age of Onset  . Heart disease Mother   . Diabetes Mother   . Heart disease Father   . Heart disease Sister   . Heart disease Brother   . Diabetes Brother   . Liver cancer Paternal Grandfather   . Throat cancer Maternal Grandfather   . Colon cancer Neg Hx   . Esophageal cancer Neg Hx   . Rectal cancer Neg Hx   . Stomach cancer Neg Hx    Social history is notable in that he is married has 3 children 5 grandchildren and 3 stepgrandchildren. He is retired. No tobacco history. He does drink rare wine. He exercises several days per week for 30-45 minutes.    ROS General: Negative; No fevers, chills, or night sweats;  HEENT: Positive for IIb.  Eyelids No changes in vision or hearing, sinus congestion, difficulty swallowing Pulmonary: Negative; No cough, wheezing, shortness of breath, hemoptysis Cardiovascular: Negative; No chest pain, presyncope, syncope, palpitations GI: Positive for GERD; No nausea, vomiting, diarrhea, or abdominal pain GU: Negative; No dysuria, hematuria, or difficulty voiding Musculoskeletal: Negative; no myalgias, joint pain, or  weakness Hematologic/Oncology: Positive for polycythemia vera for which he undergoes phlebotomy to keep his hematocrit less than 45; chronic immune thrombocytopenia; no easy bruising, bleeding Endocrine: Negative; no heat/cold intolerance; no diabetes Neuro: Negative; no changes in balance, headaches Skin: Negative; No rashes or skin lesions Psychiatric: Negative; No behavioral problems, depression Sleep: Positive for obstructive sleep apnea on CPAP therapy No snoring, daytime sleepiness, hypersomnolence, bruxism, restless legs, hypnogognic hallucinations, no cataplexy Other comprehensive 14 point system review is negative.   PE BP 126/68   Pulse 83   Ht 5' 11.5" (1.816 m)   Wt 241 lb 12.8 oz (109.7 kg)   BMI 33.25 kg/m    Wt Readings from Last 3 Encounters:  05/06/16 241 lb 12.8 oz (109.7 kg)  02/18/16 239 lb (108.4 kg)  12/31/15 231 lb (104.8 kg)   General: Alert, oriented, no distress.  Skin: normal turgor, no rashes HEENT: Normocephalic, atraumatic. Pupils round and reactive; sclera anicteric; mildly droopy eyelids Nose without nasal septal hypertrophy Mouth/Parynx benign; Mallinpatti scale 2/3 Neck: No JVD, no carotid bruits Lungs: clear to ausculatation and percussion; no wheezing or rales Chest wall: Nontender to palpation Heart: RRR, s1 s2 normal 1/6 systolic murmur.  No diastolic murmur.  No rubs, thrills or heaves Abdomen: soft, nontender; no hepatosplenomehaly, BS+; abdominal aorta nontender and not dilated by palpation. Back: No CVA tenderness Pulses 2+ Extremities: Trivial ankle edema ,no clubbing cyanosis, Homan's sign negative  Neurologic: grossly nonfocal Psychologic: normal affect and mood.  ECG (independently read by me): Normal sinus rhythm at 83 bpm.  No ectopy.  Normal intervals.  No ST segment changes.  January 2017 ECG (independently read by me):  Normal sinus rhythm at 75 bpm.  Small Q wave in lead 3.  No ST segment changes.  Normal  intervals.  December 2015 ECG (independently read by me): Normal sinus rhythm at 86 bpm.  Q wave in lead 3.  No ST segment changes.  October 2015 ECG (independently read  by me): Normal sinus rhythm at 77 bpm.  No ectopy.  Normal intervals.  November 2014 ECG: Sinus rhythm at 77 beats per minute. No change.  LABS: BMP Latest Ref Rng & Units 12/27/2015 12/26/2015 09/25/2015  Glucose 65 - 99 mg/dL 111(H) 125 112  BUN 8 - 27 mg/dL 19 18.2 23(H)  Creatinine 0.76 - 1.27 mg/dL 0.99 1.0 1.1  BUN/Creat Ratio 10 - 24 19 - -  Sodium 134 - 144 mmol/L 143 144 140  Potassium 3.5 - 5.2 mmol/L 4.8 3.9 4.1  Chloride 96 - 106 mmol/L 103 - 108  CO2 18 - 29 mmol/L 25 26 28   Calcium 8.6 - 10.2 mg/dL 9.2 9.4 9.1   Hepatic Function Latest Ref Rng & Units 12/27/2015 12/26/2015 09/25/2015  Total Protein 6.0 - 8.5 g/dL 6.2 6.8 6.3(L)  Albumin 3.5 - 4.8 g/dL 4.2 4.0 3.7  AST 0 - 40 IU/L 23 32 27  ALT 0 - 44 IU/L 21 29 28   Alk Phosphatase 39 - 117 IU/L 61 73 56  Total Bilirubin 0.0 - 1.2 mg/dL 0.7 0.92 0.90  Bilirubin, Direct 0.00 - 0.40 mg/dL 0.19 - -   CBC Latest Ref Rng & Units 03/26/2016 12/27/2015 12/26/2015  WBC 4.0 - 10.0 10e3/uL 4.3 5.1 4.7  Hemoglobin 13.0 - 17.1 g/dL 13.7 - 14.4  Hematocrit 38.7 - 49.9 % 43.5 43.1 45.4  Platelets 145 - 400 10e3/uL 117 Platelet count consistent in citrate(L) 141(L) 127(L)   Lab Results  Component Value Date   MCV 80 (L) 03/26/2016   MCV 83 12/27/2015   MCV 82 12/26/2015   Lab Results  Component Value Date   TSH 1.400 02/11/2014   No results found for: HGBA1C   Lipid Panel     Component Value Date/Time   CHOL 110 12/27/2015 0901   CHOL 150 07/20/2012 0947   TRIG 90 12/27/2015 0901   TRIG 128 07/20/2012 0947   HDL 34 (L) 12/27/2015 0901   HDL 31 (L) 07/20/2012 0947   CHOLHDL 4.0 02/11/2014 0849   VLDL 20 02/11/2014 0849   LDLCALC 80 02/11/2014 0849   LDLCALC 82 08/27/2013 0852   LDLCALC 93 07/20/2012 0947   RADIOLOGY: No results  found.  IMPRESSION:  1. Essential hypertension, benign   2. Hyperlipidemia LDL goal <100   3. OSA (obstructive sleep apnea)   4. Iron deficiency anemia, unspecified iron deficiency anemia type   5. Polycythemia   6. Mild obesity     ASSESSMENT AND PLAN: Mr. Wiegel is a 71 year old gentleman who has a history of mild hypertension, hyperlipidemia, strong family history for coronary artery disease, as well as polycythemia and chronic immune thrombocytopenia.  He has documented normal LV systolic function with mild LVH and grade 1 diastolic dysfunction.[name]  His body mass index is 33.25 with places him in the mild obese category.  We discussed weight loss, increased exercise.  His blood pressure today is stable on his regimen consisting of losartan 50 mg daily.  He is tolerating rosuvastatin 10 mg for hyperlipidemia, as well as fish oil.  He continues to be iron deficient and has a low ferritin level.  He has been undergoing periodic phlebotomies and is followed by [name].  He admits to 1 a percent compliance with reference to CPAP therapy.  He is asymptomatic and denies any breakthrough snoring, daytime sleepiness, and feels that his sleep is restorative.  As long as he remains stable, I will see him in one year for reevaluation.  Time spent: 25 minutes  Troy Sine, MD, [facility]  05/07/2016 12:35 PM

## 2016-05-14 DIAGNOSIS — N401 Enlarged prostate with lower urinary tract symptoms: Secondary | ICD-10-CM | POA: Diagnosis not present

## 2016-05-14 DIAGNOSIS — R972 Elevated prostate specific antigen [PSA]: Secondary | ICD-10-CM | POA: Diagnosis not present

## 2016-05-14 DIAGNOSIS — R351 Nocturia: Secondary | ICD-10-CM | POA: Diagnosis not present

## 2016-05-28 ENCOUNTER — Ambulatory Visit: Payer: Medicare Other | Admitting: Hematology & Oncology

## 2016-05-28 ENCOUNTER — Other Ambulatory Visit (HOSPITAL_BASED_OUTPATIENT_CLINIC_OR_DEPARTMENT_OTHER): Payer: Medicare Other

## 2016-05-28 ENCOUNTER — Encounter: Payer: Self-pay | Admitting: Hematology & Oncology

## 2016-05-28 ENCOUNTER — Ambulatory Visit (HOSPITAL_BASED_OUTPATIENT_CLINIC_OR_DEPARTMENT_OTHER): Payer: Medicare Other | Admitting: Hematology & Oncology

## 2016-05-28 ENCOUNTER — Other Ambulatory Visit: Payer: Medicare Other

## 2016-05-28 VITALS — BP 145/66 | HR 83 | Temp 98.4°F | Resp 18 | Ht 71.5 in | Wt 242.4 lb

## 2016-05-28 DIAGNOSIS — E611 Iron deficiency: Secondary | ICD-10-CM | POA: Diagnosis not present

## 2016-05-28 DIAGNOSIS — D500 Iron deficiency anemia secondary to blood loss (chronic): Secondary | ICD-10-CM | POA: Diagnosis not present

## 2016-05-28 DIAGNOSIS — D45 Polycythemia vera: Secondary | ICD-10-CM

## 2016-05-28 DIAGNOSIS — D696 Thrombocytopenia, unspecified: Secondary | ICD-10-CM

## 2016-05-28 DIAGNOSIS — D508 Other iron deficiency anemias: Secondary | ICD-10-CM

## 2016-05-28 DIAGNOSIS — K909 Intestinal malabsorption, unspecified: Secondary | ICD-10-CM

## 2016-05-28 HISTORY — DX: Intestinal malabsorption, unspecified: K90.9

## 2016-05-28 LAB — LACTATE DEHYDROGENASE: LDH: 120 U/L — ABNORMAL LOW (ref 125–245)

## 2016-05-28 LAB — CBC WITH DIFFERENTIAL (CANCER CENTER ONLY)
BASO#: 0 10*3/uL (ref 0.0–0.2)
BASO%: 0.6 % (ref 0.0–2.0)
EOS ABS: 0.2 10*3/uL (ref 0.0–0.5)
EOS%: 2.9 % (ref 0.0–7.0)
HCT: 44.1 % (ref 38.7–49.9)
HGB: 13.9 g/dL (ref 13.0–17.1)
LYMPH#: 1 10*3/uL (ref 0.9–3.3)
LYMPH%: 15.5 % (ref 14.0–48.0)
MCH: 25.1 pg — AB (ref 28.0–33.4)
MCHC: 31.5 g/dL — ABNORMAL LOW (ref 32.0–35.9)
MCV: 80 fL — ABNORMAL LOW (ref 82–98)
MONO#: 0.5 10*3/uL (ref 0.1–0.9)
MONO%: 8 % (ref 0.0–13.0)
NEUT%: 73 % (ref 40.0–80.0)
NEUTROS ABS: 4.6 10*3/uL (ref 1.5–6.5)
PLATELETS: 115 10*3/uL — AB (ref 145–400)
RBC: 5.53 10*6/uL (ref 4.20–5.70)
RDW: 16.3 % — ABNORMAL HIGH (ref 11.1–15.7)
WBC: 6.3 10*3/uL (ref 4.0–10.0)

## 2016-05-28 LAB — CMP (CANCER CENTER ONLY)
ALT(SGPT): 21 U/L (ref 10–47)
AST: 28 U/L (ref 11–38)
Albumin: 3.7 g/dL (ref 3.3–5.5)
Alkaline Phosphatase: 73 U/L (ref 26–84)
BILIRUBIN TOTAL: 0.8 mg/dL (ref 0.20–1.60)
BUN: 18 mg/dL (ref 7–22)
CO2: 25 mEq/L (ref 18–33)
CREATININE: 1.1 mg/dL (ref 0.6–1.2)
Calcium: 9.4 mg/dL (ref 8.0–10.3)
Chloride: 108 mEq/L (ref 98–108)
Glucose, Bld: 122 mg/dL — ABNORMAL HIGH (ref 73–118)
Potassium: 4 mEq/L (ref 3.3–4.7)
SODIUM: 144 meq/L (ref 128–145)
TOTAL PROTEIN: 6.3 g/dL — AB (ref 6.4–8.1)

## 2016-05-28 LAB — IRON AND TIBC
%SAT: 14 % — AB (ref 20–55)
Iron: 55 ug/dL (ref 42–163)
TIBC: 396 ug/dL (ref 202–409)
UIBC: 341 ug/dL (ref 117–376)

## 2016-05-28 LAB — FERRITIN: Ferritin: 6 ng/ml — ABNORMAL LOW (ref 22–316)

## 2016-05-28 NOTE — Progress Notes (Signed)
Hematology and Oncology Follow Up Visit  Connor [name] WN:[id number] Sep 30, 1945 71 y.o. 05/28/2016   Principle Diagnosis:   Polycythemia vera-Triple negative  Chronic immune thrombocytopenia vs medication  Transient iron deficiency secondary to phlebotomies  Current Therapy:    Phlebotomy to maintain hematocrit below 45%  Aspirin 81 mg by mouth daily  IV iron as indicated     Interim History:  [name] is back for followup. He is feeling quite fatigued. His wife said that he does not have a lot of stamina. I suspect that this probably is from iron deficiency. We have been phlebotomizing him. Every now and then, we do have to give IV iron back to patients with polycythemia. I think that it Connor [name] case, it would be okay to give him IV iron.  A month ago, his iron studies showed a ferritin of 8. His iron saturation was 13%.  He has had no obvious bleeding.  He's had no rashes. He's had no leg swelling.  He is trying to exercise but he gets fatigued fairly easily because of the low iron.  Overall, his performance status is ECOG 1.   Medications:  Current Outpatient Prescriptions:  .  alfuzosin (UROXATRAL) 10 MG 24 hr tablet, Take 10 mg by mouth every morning. , Disp: , Rfl:  .  aspirin 81 MG tablet, Take 81 mg by mouth 2 (two) times daily. , Disp: , Rfl:  .  Cholecalciferol (VITAMIN D-3) 5000 UNITS TABS, Take 1 tablet by mouth daily. , Disp: , Rfl:  .  Coenzyme Q10 (CO Q 10 PO), Take 1 capsule by mouth daily. , Disp: , Rfl:  .  cyclobenzaprine (FLEXERIL) 10 MG tablet, Take 1 tablet (10 mg total) by mouth 3 (three) times daily as needed for muscle spasms., Disp: 30 tablet, Rfl: 1 .  fish oil-omega-3 fatty acids 1000 MG capsule, Take 2 g by mouth daily. , Disp: , Rfl:  .  Glucosamine-Chondroit-Vit C-Mn (GLUCOSAMINE CHONDR 1500 COMPLX) CAPS, Take by mouth every morning., Disp: , Rfl:  .  Ipratropium-Albuterol (COMBIVENT) 20-100 MCG/ACT AERS respimat, Inhale 1 puff into the  lungs as needed for wheezing or shortness of breath., Disp: 1 Inhaler, Rfl: 11 .  losartan (COZAAR) 50 MG tablet, TAKE 1 TABLET (50 MG TOTAL) BY MOUTH DAILY., Disp: 90 tablet, Rfl: 0 .  montelukast (SINGULAIR) 10 MG tablet, TAKE 1 TABLET AT BEDTIME, Disp: 30 tablet, Rfl: 4 .  Multiple Vitamins-Minerals (CENTRUM SILVER ULTRA MENS) TABS, Take 1 tablet by mouth every morning. Once a day, Disp: , Rfl:  .  polyethylene glycol (MIRALAX / GLYCOLAX) packet, Take 17 g by mouth as needed. , Disp: , Rfl:  .  Probiotic Product (PHILLIPS COLON HEALTH PO), Take 1 capsule by mouth daily. , Disp: , Rfl:  .  pyridOXINE (VITAMIN B-6) 100 MG tablet, Take 100 mg by mouth daily. , Disp: , Rfl:  .  rosuvastatin (CRESTOR) 10 MG tablet, TAKE 1 TABLET (10 MG TOTAL) BY MOUTH DAILY., Disp: 90 tablet, Rfl: 0 .  sildenafil (REVATIO) 20 MG tablet, Take 2-5 tabs prior to sexual activity, Disp: 50 tablet, Rfl: 1 .  Turmeric 500 MG CAPS, Take by mouth every morning., Disp: , Rfl:  .  vitamin B-12 (CYANOCOBALAMIN) 500 MCG tablet, Take 500 mcg by mouth daily., Disp: , Rfl:   Allergies:  Allergies  Allergen Reactions  . Dexlansoprazole Nausea And Vomiting    Past Medical History, Surgical history, Social history, and Family History were reviewed and  updated.  Review of Systems: As above  Physical Exam:  height is 5' 11.5" (1.816 m) and weight is 242 lb 6.4 oz (110 kg). His oral temperature is 98.4 F (36.9 C). His blood pressure is 145/66 (abnormal) and his pulse is 83. His respiration is 18 and oxygen saturation is 98%.   Well-developed and well-nourished white gentleman. Head and neck exam shows no ocular or oral lesions.  He has no palpable cervical or supraclavicular lymph nodes. Lungs are clear. Cardiac exam regular rate and rhythm with no murmurs rubs or bruits. Abdomen is soft.  He has good bowel sounds. There is no fluid wave. There is no palpable liver or spleen tip. Extremities shows no clubbing cyanosis or edema.  Strength is good bilaterally. Skin exam shows an erythematous-type rash with central clearing in the right inguinal area. There might be a bite mark.  Lab Results  Component Value Date   WBC 6.3 05/28/2016   HGB 13.9 05/28/2016   HCT 44.1 05/28/2016   MCV 80 (L) 05/28/2016   PLT 115 (L) 05/28/2016     Chemistry      Component Value Date/Time   NA 144 05/28/2016 1006   NA 144 12/26/2015 1010   K 4.0 05/28/2016 1006   K 3.9 12/26/2015 1010   CL 108 05/28/2016 1006   CO2 25 05/28/2016 1006   CO2 26 12/26/2015 1010   BUN 18 05/28/2016 1006   BUN 18.2 12/26/2015 1010   CREATININE 1.1 05/28/2016 1006   CREATININE 1.0 12/26/2015 1010      Component Value Date/Time   CALCIUM 9.4 05/28/2016 1006   CALCIUM 9.4 12/26/2015 1010   ALKPHOS 73 05/28/2016 1006   ALKPHOS 73 12/26/2015 1010   AST 28 05/28/2016 1006   AST 32 12/26/2015 1010   ALT 21 05/28/2016 1006   ALT 29 12/26/2015 1010   BILITOT 0.80 05/28/2016 1006   BILITOT 0.92 12/26/2015 1010         Impression and Plan: Connor [name] is a 71 year old gentleman with polycythemia.He does not need to be phlebotomized today.  We will go ahead and set him up with IV iron neck suite. I think this will help him. It may drop his platelet count down a little bit but I'll think that this is going to be a problem.  I will like to see him back in 6 weeks. Hopefully, we will see that he is feeling better and has more energy.    Volanda Napoleon, MD 2/16/201811:21 AM

## 2016-05-29 ENCOUNTER — Other Ambulatory Visit: Payer: Self-pay | Admitting: Cardiovascular Disease

## 2016-05-31 ENCOUNTER — Other Ambulatory Visit: Payer: Self-pay | Admitting: *Deleted

## 2016-05-31 MED ORDER — LOSARTAN POTASSIUM 50 MG PO TABS: ORAL_TABLET | ORAL | 0 refills | Status: DC

## 2016-06-02 ENCOUNTER — Ambulatory Visit (HOSPITAL_BASED_OUTPATIENT_CLINIC_OR_DEPARTMENT_OTHER): Payer: Medicare Other

## 2016-06-02 VITALS — BP 136/65 | HR 78 | Temp 98.0°F | Resp 19

## 2016-06-02 DIAGNOSIS — D509 Iron deficiency anemia, unspecified: Secondary | ICD-10-CM | POA: Diagnosis present

## 2016-06-02 DIAGNOSIS — D508 Other iron deficiency anemias: Secondary | ICD-10-CM

## 2016-06-02 DIAGNOSIS — K909 Intestinal malabsorption, unspecified: Secondary | ICD-10-CM

## 2016-06-02 MED ORDER — SODIUM CHLORIDE 0.9 % IV SOLN
Freq: Once | INTRAVENOUS | Status: AC
  Administered 2016-06-02: 11:00:00 via INTRAVENOUS

## 2016-06-02 MED ORDER — SODIUM CHLORIDE 0.9 % IV SOLN
510.0000 mg | Freq: Once | INTRAVENOUS | Status: AC
  Administered 2016-06-02: 510 mg via INTRAVENOUS
  Filled 2016-06-02: qty 17

## 2016-06-02 NOTE — Patient Instructions (Signed)
Ferumoxytol injection What is this medicine? FERUMOXYTOL is an iron complex. Iron is used to make healthy red blood cells, which carry oxygen and nutrients throughout the body. This medicine is used to treat iron deficiency anemia in people with chronic kidney disease. COMMON BRAND NAME(S): Feraheme What should I tell my health care provider before I take this medicine? They need to know if you have any of these conditions: -anemia not caused by low iron levels -high levels of iron in the blood -magnetic resonance imaging (MRI) test scheduled -an unusual or allergic reaction to iron, other medicines, foods, dyes, or preservatives -pregnant or trying to get pregnant -breast-feeding How should I use this medicine? This medicine is for injection into a vein. It is given by a health care professional in a hospital or clinic setting. Talk to your pediatrician regarding the use of this medicine in children. Special care may be needed. What if I miss a dose? It is important not to miss your dose. Call your doctor or health care professional if you are unable to keep an appointment. What may interact with this medicine? This medicine may interact with the following medications: -other iron products What should I watch for while using this medicine? Visit your doctor or healthcare professional regularly. Tell your doctor or healthcare professional if your symptoms do not start to get better or if they get worse. You may need blood work done while you are taking this medicine. You may need to follow a special diet. Talk to your doctor. Foods that contain iron include: whole grains/cereals, dried fruits, beans, or peas, leafy green vegetables, and organ meats (liver, kidney). What side effects may I notice from receiving this medicine? Side effects that you should report to your doctor or health care professional as soon as possible: -allergic reactions like skin rash, itching or hives, swelling of the  face, lips, or tongue -breathing problems -changes in blood pressure -feeling faint or lightheaded, falls -fever or chills -flushing, sweating, or hot feelings -swelling of the ankles or feet Side effects that usually do not require medical attention (report to your doctor or health care professional if they continue or are bothersome): -diarrhea -headache -nausea, vomiting -stomach pain Where should I keep my medicine? This drug is given in a hospital or clinic and will not be stored at home.  2017 Elsevier/Gold Standard (2015-05-01 12:41:49)  

## 2016-06-04 ENCOUNTER — Other Ambulatory Visit: Payer: Self-pay | Admitting: Family Medicine

## 2016-06-25 ENCOUNTER — Other Ambulatory Visit (INDEPENDENT_AMBULATORY_CARE_PROVIDER_SITE_OTHER): Payer: Medicare Other

## 2016-06-25 DIAGNOSIS — N400 Enlarged prostate without lower urinary tract symptoms: Secondary | ICD-10-CM | POA: Diagnosis not present

## 2016-06-25 DIAGNOSIS — D696 Thrombocytopenia, unspecified: Secondary | ICD-10-CM | POA: Diagnosis not present

## 2016-06-25 DIAGNOSIS — D508 Other iron deficiency anemias: Secondary | ICD-10-CM | POA: Diagnosis not present

## 2016-06-25 DIAGNOSIS — I10 Essential (primary) hypertension: Secondary | ICD-10-CM | POA: Diagnosis not present

## 2016-06-25 DIAGNOSIS — E559 Vitamin D deficiency, unspecified: Secondary | ICD-10-CM | POA: Diagnosis not present

## 2016-06-25 DIAGNOSIS — E7800 Pure hypercholesterolemia, unspecified: Secondary | ICD-10-CM | POA: Diagnosis not present

## 2016-06-26 LAB — BMP8+EGFR
BUN / CREAT RATIO: 17 (ref 10–24)
BUN: 17 mg/dL (ref 8–27)
CALCIUM: 9.5 mg/dL (ref 8.6–10.2)
CHLORIDE: 106 mmol/L (ref 96–106)
CO2: 25 mmol/L (ref 18–29)
Creatinine, Ser: 1 mg/dL (ref 0.76–1.27)
GFR calc non Af Amer: 75 mL/min/{1.73_m2} (ref >59)
GFR, EST AFRICAN AMERICAN: 87 mL/min/{1.73_m2} (ref >59)
GLUCOSE: 113 mg/dL — AB (ref 65–99)
POTASSIUM: 4.3 mmol/L (ref 3.5–5.2)
Sodium: 147 mmol/L — ABNORMAL HIGH (ref 134–144)

## 2016-06-26 LAB — CBC WITH DIFFERENTIAL/PLATELET
BASOS ABS: 0 10*3/uL (ref 0.0–0.2)
BASOS: 0 %
EOS (ABSOLUTE): 0.1 10*3/uL (ref 0.0–0.4)
Eos: 3 %
Hematocrit: 46.6 % (ref 37.5–51.0)
Hemoglobin: 14.8 g/dL (ref 13.0–17.7)
IMMATURE GRANULOCYTES: 0 %
Immature Grans (Abs): 0 10*3/uL (ref 0.0–0.1)
Lymphocytes Absolute: 1 10*3/uL (ref 0.7–3.1)
Lymphs: 19 %
MCH: 26.1 pg — ABNORMAL LOW (ref 26.6–33.0)
MCHC: 31.8 g/dL (ref 31.5–35.7)
MCV: 82 fL (ref 79–97)
MONOS ABS: 0.4 10*3/uL (ref 0.1–0.9)
Monocytes: 7 %
NEUTROS PCT: 71 %
Neutrophils Absolute: 3.7 10*3/uL (ref 1.4–7.0)
PLATELETS: 138 10*3/uL — AB (ref 150–379)
RBC: 5.68 x10E6/uL (ref 4.14–5.80)
RDW: 18.2 % — ABNORMAL HIGH (ref 12.3–15.4)
WBC: 5.2 10*3/uL (ref 3.4–10.8)

## 2016-06-26 LAB — NMR, LIPOPROFILE
Cholesterol: 116 mg/dL (ref 100–199)
HDL Cholesterol by NMR: 28 mg/dL — ABNORMAL LOW (ref >39)
HDL PARTICLE NUMBER: 27.9 umol/L — AB (ref >=30.5)
LDL Particle Number: 1080 nmol/L — ABNORMAL HIGH (ref <1000)
LDL Size: 20 nm (ref >20.5)
LDL-C: 62 mg/dL (ref 0–99)
LP-IR Score: 75 — ABNORMAL HIGH (ref <=45)
SMALL LDL PARTICLE NUMBER: 716 nmol/L — AB (ref <=527)
Triglycerides by NMR: 132 mg/dL (ref 0–149)

## 2016-06-26 LAB — HEPATIC FUNCTION PANEL
ALT: 17 IU/L (ref 0–44)
AST: 20 IU/L (ref 0–40)
Albumin: 4.5 g/dL (ref 3.5–4.8)
Alkaline Phosphatase: 82 IU/L (ref 39–117)
BILIRUBIN TOTAL: 0.7 mg/dL (ref 0.0–1.2)
Bilirubin, Direct: 0.21 mg/dL (ref 0.00–0.40)
Total Protein: 6.5 g/dL (ref 6.0–8.5)

## 2016-06-26 LAB — TESTOSTERONE,FREE AND TOTAL
TESTOSTERONE FREE: 7.8 pg/mL (ref 6.6–18.1)
Testosterone: 335 ng/dL (ref 264–916)

## 2016-06-26 LAB — PSA, TOTAL AND FREE
PROSTATE SPECIFIC AG, SERUM: 1.8 ng/mL (ref 0.0–4.0)
PSA, Free Pct: 27.2 %
PSA, Free: 0.49 ng/mL

## 2016-06-26 LAB — VITAMIN D 25 HYDROXY (VIT D DEFICIENCY, FRACTURES): Vit D, 25-Hydroxy: 54.4 ng/mL (ref 30.0–100.0)

## 2016-06-30 ENCOUNTER — Ambulatory Visit: Payer: Self-pay | Admitting: Family Medicine

## 2016-06-30 DIAGNOSIS — L814 Other melanin hyperpigmentation: Secondary | ICD-10-CM | POA: Diagnosis not present

## 2016-06-30 DIAGNOSIS — Z85828 Personal history of other malignant neoplasm of skin: Secondary | ICD-10-CM | POA: Diagnosis not present

## 2016-06-30 DIAGNOSIS — L821 Other seborrheic keratosis: Secondary | ICD-10-CM | POA: Diagnosis not present

## 2016-06-30 DIAGNOSIS — D225 Melanocytic nevi of trunk: Secondary | ICD-10-CM | POA: Diagnosis not present

## 2016-06-30 DIAGNOSIS — L603 Nail dystrophy: Secondary | ICD-10-CM | POA: Diagnosis not present

## 2016-06-30 DIAGNOSIS — L298 Other pruritus: Secondary | ICD-10-CM | POA: Diagnosis not present

## 2016-06-30 DIAGNOSIS — I788 Other diseases of capillaries: Secondary | ICD-10-CM | POA: Diagnosis not present

## 2016-06-30 DIAGNOSIS — D2372 Other benign neoplasm of skin of left lower limb, including hip: Secondary | ICD-10-CM | POA: Diagnosis not present

## 2016-07-02 ENCOUNTER — Encounter: Payer: Self-pay | Admitting: Family Medicine

## 2016-07-02 ENCOUNTER — Ambulatory Visit (INDEPENDENT_AMBULATORY_CARE_PROVIDER_SITE_OTHER): Payer: Medicare Other | Admitting: Family Medicine

## 2016-07-02 ENCOUNTER — Ambulatory Visit (INDEPENDENT_AMBULATORY_CARE_PROVIDER_SITE_OTHER): Payer: Medicare Other

## 2016-07-02 VITALS — BP 115/59 | HR 89 | Temp 98.4°F | Ht 71.5 in | Wt 240.0 lb

## 2016-07-02 DIAGNOSIS — C44321 Squamous cell carcinoma of skin of nose: Secondary | ICD-10-CM

## 2016-07-02 DIAGNOSIS — I10 Essential (primary) hypertension: Secondary | ICD-10-CM

## 2016-07-02 DIAGNOSIS — N400 Enlarged prostate without lower urinary tract symptoms: Secondary | ICD-10-CM | POA: Diagnosis not present

## 2016-07-02 DIAGNOSIS — E7800 Pure hypercholesterolemia, unspecified: Secondary | ICD-10-CM

## 2016-07-02 DIAGNOSIS — R05 Cough: Secondary | ICD-10-CM

## 2016-07-02 DIAGNOSIS — E559 Vitamin D deficiency, unspecified: Secondary | ICD-10-CM | POA: Diagnosis not present

## 2016-07-02 DIAGNOSIS — D45 Polycythemia vera: Secondary | ICD-10-CM

## 2016-07-02 DIAGNOSIS — D696 Thrombocytopenia, unspecified: Secondary | ICD-10-CM | POA: Diagnosis not present

## 2016-07-02 DIAGNOSIS — J449 Chronic obstructive pulmonary disease, unspecified: Secondary | ICD-10-CM | POA: Diagnosis not present

## 2016-07-02 DIAGNOSIS — R059 Cough, unspecified: Secondary | ICD-10-CM

## 2016-07-02 MED ORDER — AZITHROMYCIN 250 MG PO TABS: ORAL_TABLET | ORAL | 0 refills | Status: DC

## 2016-07-02 NOTE — Patient Instructions (Addendum)
Medicare Annual Wellness Visit  [facility] and the medical providers at [facility] strive to bring you the best medical care.  In doing so we not only want to address your current medical conditions and concerns but also to detect new conditions early and prevent illness, disease and health-related problems.    Medicare offers a yearly Wellness Visit which allows our clinical staff to assess your need for preventative services including immunizations, lifestyle education, counseling to decrease risk of preventable diseases and screening for fall risk and other medical concerns.    This visit is provided free of charge (no copay) for all Medicare recipients. The clinical pharmacists at [facility] have begun to conduct these Wellness Visits which will also include a thorough review of all your medications.    As you primary medical provider recommend that you make an appointment for your Annual Wellness Visit if you have not done so already this year.  You may set up this appointment before you leave today or you may call back ([phone]) and schedule an appointment.  Please make sure when you call that you mention that you are scheduling your Annual Wellness Visit with the clinical pharmacist so that the appointment may be made for the proper length of time.     Continue current medications. Continue good therapeutic lifestyle changes which include good diet and exercise. Fall precautions discussed with patient. If an FOBT was given today- please return it to our front desk. If you are over 104 years old - you may need Prevnar 12 or the adult Pneumonia vaccine.  **Flu shots are available--- please call and schedule a FLU-CLINIC appointment**  After your visit with [place] today you will receive a survey in the mail or online from [employer] regarding your care with [place]. Please take a moment to fill this out. Your feedback is very  important to [place] as you can help [place] better understand your patient needs as well as improve your experience and satisfaction. WE CARE ABOUT YOU!!!  Take antibiotic as directed and until completed Continue to use Mucinex one twice daily with a large glass of water Continue to use nasal saline Continue to use humidification Drink plenty of fluids and stay well hydrated and take Tylenol if needed for aches pains and fever Continue to follow-up with hematology and urology

## 2016-07-02 NOTE — Progress Notes (Signed)
Subjective:    Patient ID: Connor [name], male    DOB: 08-30-1945, 71 y.o.   MRN: [id number]  HPI Pt here for follow up and management of chronic medical problems which includes hyperlipidemia and hypertension. He is taking medication regularly.The patient is doing well today other than he does complain with some cough and congestion. He will get a chest x-ray today and he is had lab work which we will review with him. He sees the urologist regularly for his prostate exam and PSA. The patient sees the hematologist regularly because of his polycythemia. He has had his lab work and we will review this with him during the visit today his PSA was 1.8. The CBC had a normal white blood cell count. The hemoglobin was good at 14.8. The platelet count was slightly decreased as it has been in the past at 138,000. The blood sugar slightly elevated at 113 and his creatinine, the most important kidney function test was normal. The sodium was slightly elevated but the remainder of the electrolytes including the potassium are good. All liver function tests were normal. The vitamin D level was excellent at 54.4 he will continue with his current vitamin D treatment. Testosterone levels were within normal limits although at the lower end of the normal range. Cholesterol numbers with advanced lipid testing are similar to what they were previously with a total LDL particle number being 1080 and the goal being less than 1000. His LDL C was good at 62. Triglycerides are good at 132. The patient is pleasant and alert. He does see the urologist regularly. He'll take a copy of the lab work to the urologist and to his hematologist. The patient denies any chest pain. He has had some shortness of breath with the cough and congestion. He says the congestion is yellow in color and is mostly in his chest but does have some head congestion also. He's had some headache. He denies any trouble with his stomach including swallowing problems  nausea vomiting diarrhea or blood in the stool. He's passing his water without problems.   Patient Active Problem List   Diagnosis Date Noted  . Iron malabsorption 05/28/2016  . Hyperlipidemia LDL goal <100 04/12/2014  . Polycythemia vera ([place]) 04/19/2013  . Family history of early CAD 02/19/2013  . Iron deficiency anemia 09/01/2012  . H/O vitamin D deficiency 05/21/2012  . Diverticulosis of colon 05/21/2012  . Carotid artery stenosis, asymptomatic 05/21/2012  . BPH (benign prostatic hyperplasia)   . Cardiomegaly   . Thrombocytopenia ([place])   . Lap Nissen Fundoplication August 8242 11/26/2011  . Weight gain, abnormal 08/27/2009  . ESSENTIAL HYPERTENSION, BENIGN 08/27/2009  . OSA (obstructive sleep apnea) 02/19/2008  . COPD, mild ([place]) 05/26/2007   Outpatient Encounter Prescriptions as of 07/02/2016  Medication Sig  . alfuzosin (UROXATRAL) 10 MG 24 hr tablet Take 10 mg by mouth every morning.   [name] aspirin 81 MG tablet Take 81 mg by mouth 2 (two) times daily.   . Cholecalciferol (VITAMIN D-3) 5000 UNITS TABS Take 1 tablet by mouth daily.   . Coenzyme Q10 (CO Q 10 PO) Take 1 capsule by mouth daily.   . cyclobenzaprine (FLEXERIL) 10 MG tablet Take 1 tablet (10 mg total) by mouth 3 (three) times daily as needed for muscle spasms.  . fish oil-omega-3 fatty acids 1000 MG capsule Take 2 g by mouth daily.   . Glucosamine-Chondroit-Vit C-Mn (GLUCOSAMINE CHONDR 1500 COMPLX) CAPS Take by mouth every morning.  [name]  Ipratropium-Albuterol (COMBIVENT) 20-100 MCG/ACT AERS respimat Inhale 1 puff into the lungs as needed for wheezing or shortness of breath.  . losartan (COZAAR) 50 MG tablet TAKE 1 TABLET (50 MG TOTAL) BY MOUTH DAILY.  . montelukast (SINGULAIR) 10 MG tablet TAKE 1 TABLET AT BEDTIME  . Multiple Vitamins-Minerals (CENTRUM SILVER ULTRA MENS) TABS Take 1 tablet by mouth every morning. Once a day  . polyethylene glycol (MIRALAX / GLYCOLAX) packet Take 17 g by mouth as needed.   . Probiotic  Product (PHILLIPS COLON HEALTH PO) Take 1 capsule by mouth daily.   [name] pyridOXINE (VITAMIN B-6) 100 MG tablet Take 100 mg by mouth daily.   . rosuvastatin (CRESTOR) 10 MG tablet TAKE 1 TABLET (10 MG TOTAL) BY MOUTH DAILY.  . sildenafil (REVATIO) 20 MG tablet Take 2-5 tabs prior to sexual activity  . Turmeric 500 MG CAPS Take by mouth every morning.  . vitamin B-12 (CYANOCOBALAMIN) 500 MCG tablet Take 500 mcg by mouth daily.   No facility-administered encounter medications on file as of 07/02/2016.       Review of Systems  Constitutional: Negative.   HENT: Positive for congestion.   Eyes: Negative.   Respiratory: Positive for cough (x 2 days).   Cardiovascular: Negative.   Gastrointestinal: Negative.   Endocrine: Negative.   Genitourinary: Negative.   Musculoskeletal: Negative.   Skin: Negative.   Allergic/Immunologic: Negative.   Neurological: Negative.   Hematological: Negative.   Psychiatric/Behavioral: Negative.        Objective:   Physical Exam  Constitutional: He is oriented to person, place, and time. He appears well-developed and well-nourished. No distress.  HENT:  Head: Normocephalic and atraumatic.  Right Ear: External ear normal.  Left Ear: External ear normal.  Nose: Nose normal.  Mouth/Throat: Oropharynx is clear and moist. No oropharyngeal exudate.  Throat is clear with minimal drainage  Eyes: Conjunctivae and EOM are normal. Pupils are equal, round, and reactive to light. Right eye exhibits no discharge. Left eye exhibits no discharge. No scleral icterus.  Neck: Normal range of motion. Neck supple. No thyromegaly present.  No anterior cervical adenopathy or thyromegaly  Cardiovascular: Normal rate, regular rhythm, normal heart sounds and intact distal pulses.   No murmur heard. The heart is regular at 72/m  Pulmonary/Chest: Effort normal and breath sounds normal. No respiratory distress. He has no wheezes. He has no rales. He exhibits no tenderness.  Dry  cough no axillary adenopathy  Abdominal: Soft. Bowel sounds are normal. He exhibits no mass. There is no tenderness. There is no rebound and no guarding.  No abdominal tenderness or mass or liver or spleen enlargement  Genitourinary:  Genitourinary Comments: Exam is done yearly by the urologist  Musculoskeletal: Normal range of motion. He exhibits no edema.  Lymphadenopathy:    He has no cervical adenopathy.  Neurological: He is alert and oriented to person, place, and time. He has normal reflexes. No cranial nerve deficit.  Skin: Skin is warm and dry. No rash noted.  Psychiatric: He has a normal mood and affect. His behavior is normal. Judgment and thought content normal.  Nursing note and vitals reviewed.  BP (!) 115/59 (BP Location: Left Arm)   Pulse 89   Temp 98.4 F (36.9 C) (Oral)   Ht 5' 11.5" (1.816 m)   Wt 240 lb (108.9 kg)   BMI 33.01 kg/m         Assessment & Plan:  1. Thrombocytopenia ([place]) -The recent platelet count still remains low  but is slightly higher than in the past. He continues to be followed by the hematologist. He has no bleeding issues.  2. Essential hypertension, benign -The blood pressure is good today and he will continue with current treatment - DG Chest 2 View; Future  3. Benign prostatic hyperplasia, unspecified whether lower urinary tract symptoms present -He continues to be followed by the urologist yearly  4. Pure hypercholesterolemia -Cholesterol numbers were stable but slightly increased there will be no change in treatment he will try to practice more aggressive therapeutic lifestyle changes - DG Chest 2 View; Future  5. Vitamin D deficiency -He'll continue with his current vitamin D dosing  6. Cough -Mucinex maximum strength, use albuterol inhaler more frequently for the next few days and take Z-Pak as directed. Drink plenty of fluids and stay well hydrated - DG Chest 2 View; Future  7. Polycythemia vera ([place]) -Follow-up with  hematology as planned  8. Squamous cell cancer of skin of nose -Follow-up with dermatology as planned  9. COPD, mild ([place]) -Take antibiotic as directed, use inhaler as directed use cool mist humidification and take Mucinex. If the patient does not get better he'll get back in touch with [place]. A chest x-ray is being done today and is pending  Meds ordered this encounter  Medications  . azithromycin (ZITHROMAX) 250 MG tablet    Sig: As directed    Dispense:  6 tablet    Refill:  0   Patient Instructions                       Medicare Annual Wellness Visit  [facility] and the medical providers at [facility] strive to bring you the best medical care.  In doing so we not only want to address your current medical conditions and concerns but also to detect new conditions early and prevent illness, disease and health-related problems.    Medicare offers a yearly Wellness Visit which allows our clinical staff to assess your need for preventative services including immunizations, lifestyle education, counseling to decrease risk of preventable diseases and screening for fall risk and other medical concerns.    This visit is provided free of charge (no copay) for all Medicare recipients. The clinical pharmacists at [facility] have begun to conduct these Wellness Visits which will also include a thorough review of all your medications.    As you primary medical provider recommend that you make an appointment for your Annual Wellness Visit if you have not done so already this year.  You may set up this appointment before you leave today or you may call back ([phone]) and schedule an appointment.  Please make sure when you call that you mention that you are scheduling your Annual Wellness Visit with the clinical pharmacist so that the appointment may be made for the proper length of time.     Continue current medications. Continue good therapeutic  lifestyle changes which include good diet and exercise. Fall precautions discussed with patient. If an FOBT was given today- please return it to our front desk. If you are over 10 years old - you may need Prevnar 85 or the adult Pneumonia vaccine.  **Flu shots are available--- please call and schedule a FLU-CLINIC appointment**  After your visit with [place] today you will receive a survey in the mail or online from [employer] regarding your care with [place]. Please take a moment to fill this out. Your feedback is  very important to [place] as you can help [place] better understand your patient needs as well as improve your experience and satisfaction. WE CARE ABOUT YOU!!!  Take antibiotic as directed and until completed Continue to use Mucinex one twice daily with a large glass of water Continue to use nasal saline Continue to use humidification Drink plenty of fluids and stay well hydrated and take Tylenol if needed for aches pains and fever Continue to follow-up with hematology and urology   Arrie Senate MD

## 2016-07-09 ENCOUNTER — Ambulatory Visit (HOSPITAL_BASED_OUTPATIENT_CLINIC_OR_DEPARTMENT_OTHER): Payer: Medicare Other | Admitting: Hematology & Oncology

## 2016-07-09 ENCOUNTER — Other Ambulatory Visit (HOSPITAL_BASED_OUTPATIENT_CLINIC_OR_DEPARTMENT_OTHER): Payer: Medicare Other

## 2016-07-09 VITALS — BP 139/69 | HR 84 | Temp 97.7°F | Resp 18 | Wt 239.0 lb

## 2016-07-09 DIAGNOSIS — D508 Other iron deficiency anemias: Secondary | ICD-10-CM

## 2016-07-09 DIAGNOSIS — K909 Intestinal malabsorption, unspecified: Secondary | ICD-10-CM

## 2016-07-09 DIAGNOSIS — R635 Abnormal weight gain: Secondary | ICD-10-CM

## 2016-07-09 DIAGNOSIS — R5383 Other fatigue: Secondary | ICD-10-CM | POA: Diagnosis not present

## 2016-07-09 DIAGNOSIS — D45 Polycythemia vera: Secondary | ICD-10-CM | POA: Diagnosis not present

## 2016-07-09 LAB — COMPREHENSIVE METABOLIC PANEL
ALT: 22 U/L (ref 0–55)
AST: 22 U/L (ref 5–34)
Albumin: 3.8 g/dL (ref 3.5–5.0)
Alkaline Phosphatase: 85 U/L (ref 40–150)
Anion Gap: 9 mEq/L (ref 3–11)
BUN: 19 mg/dL (ref 7.0–26.0)
CALCIUM: 9.5 mg/dL (ref 8.4–10.4)
CHLORIDE: 110 meq/L — AB (ref 98–109)
CO2: 22 mEq/L (ref 22–29)
Creatinine: 0.9 mg/dL (ref 0.7–1.3)
EGFR: 80 mL/min/{1.73_m2} — ABNORMAL LOW (ref >90)
Glucose: 137 mg/dl (ref 70–140)
Potassium: 4 mEq/L (ref 3.5–5.1)
Sodium: 141 mEq/L (ref 136–145)
TOTAL PROTEIN: 6.4 g/dL (ref 6.4–8.3)
Total Bilirubin: 0.62 mg/dL (ref 0.20–1.20)

## 2016-07-09 LAB — CBC WITH DIFFERENTIAL (CANCER CENTER ONLY)
BASO#: 0 10*3/uL (ref 0.0–0.2)
BASO%: 0.6 % (ref 0.0–2.0)
EOS%: 3.6 % (ref 0.0–7.0)
Eosinophils Absolute: 0.2 10*3/uL (ref 0.0–0.5)
HEMATOCRIT: 44 % (ref 38.7–49.9)
HEMOGLOBIN: 14.1 g/dL (ref 13.0–17.1)
LYMPH#: 0.9 10*3/uL (ref 0.9–3.3)
LYMPH%: 18.2 % (ref 14.0–48.0)
MCH: 26.6 pg — ABNORMAL LOW (ref 28.0–33.4)
MCHC: 32 g/dL (ref 32.0–35.9)
MCV: 83 fL (ref 82–98)
MONO#: 0.3 10*3/uL (ref 0.1–0.9)
MONO%: 6.6 % (ref 0.0–13.0)
NEUT%: 71 % (ref 40.0–80.0)
NEUTROS ABS: 3.5 10*3/uL (ref 1.5–6.5)
Platelets: 144 10*3/uL — ABNORMAL LOW (ref 145–400)
RBC: 5.31 10*6/uL (ref 4.20–5.70)
RDW: 17.4 % — ABNORMAL HIGH (ref 11.1–15.7)
WBC: 5 10*3/uL (ref 4.0–10.0)

## 2016-07-09 LAB — RETICULOCYTES: RETICULOCYTE COUNT: 0.8 % (ref 0.6–2.6)

## 2016-07-09 LAB — IRON AND TIBC
%SAT: 26 % (ref 20–55)
Iron: 80 ug/dL (ref 42–163)
TIBC: 306 ug/dL (ref 202–409)
UIBC: 226 ug/dL (ref 117–376)

## 2016-07-09 LAB — FERRITIN: Ferritin: 24 ng/ml (ref 22–316)

## 2016-07-09 NOTE — Progress Notes (Signed)
Hematology and Oncology Follow Up Visit  Connor [name] [id number] 02-May-1945 71 y.o. 07/09/2016   Principle Diagnosis:   Polycythemia vera-Triple negative  Chronic immune thrombocytopenia vs medication  Transient iron deficiency secondary to phlebotomies  Current Therapy:    Phlebotomy to maintain hematocrit below 45%  Aspirin 81 mg by mouth daily  IV iron as indicated - last dose given on 06/02/2016     Interim History:  Mr.  Connor [name] is back for followup. He is feeling quite fatigued. His wife said that he does not have a lot of stamina. He last iron back in February area and I think the problem might be his testosterone. His testosterone level is on the low side but he cannot get supplemental testosterone for by insurance because they level is not low enough.   He has had no obvious bleeding.  He's had no rashes. He's had no leg swelling.  He is trying to exercise but he gets fatigued fairly easily because of the low testosterone.  Overall, his performance status is ECOG 1.   Medications:  Current Outpatient Prescriptions:  .  alfuzosin (UROXATRAL) 10 MG 24 hr tablet, Take 10 mg by mouth every morning. , Disp: , Rfl:  .  aspirin 81 MG tablet, Take 81 mg by mouth 2 (two) times daily. , Disp: , Rfl:  .  azithromycin (ZITHROMAX) 250 MG tablet, As directed, Disp: 6 tablet, Rfl: 0 .  Cholecalciferol (VITAMIN D-3) 5000 UNITS TABS, Take 1 tablet by mouth daily. , Disp: , Rfl:  .  Coenzyme Q10 (CO Q 10 PO), Take 1 capsule by mouth daily. , Disp: , Rfl:  .  cyclobenzaprine (FLEXERIL) 10 MG tablet, Take 1 tablet (10 mg total) by mouth 3 (three) times daily as needed for muscle spasms., Disp: 30 tablet, Rfl: 1 .  fish oil-omega-3 fatty acids 1000 MG capsule, Take 2 g by mouth daily. , Disp: , Rfl:  .  Glucosamine-Chondroit-Vit C-Mn (GLUCOSAMINE CHONDR 1500 COMPLX) CAPS, Take by mouth every morning., Disp: , Rfl:  .  Ipratropium-Albuterol (COMBIVENT) 20-100 MCG/ACT AERS respimat,  Inhale 1 puff into the lungs as needed for wheezing or shortness of breath., Disp: 1 Inhaler, Rfl: 11 .  losartan (COZAAR) 50 MG tablet, TAKE 1 TABLET (50 MG TOTAL) BY MOUTH DAILY., Disp: 90 tablet, Rfl: 0 .  montelukast (SINGULAIR) 10 MG tablet, TAKE 1 TABLET AT BEDTIME, Disp: 30 tablet, Rfl: 4 .  Multiple Vitamins-Minerals (CENTRUM SILVER ULTRA MENS) TABS, Take 1 tablet by mouth every morning. Once a day, Disp: , Rfl:  .  polyethylene glycol (MIRALAX / GLYCOLAX) packet, Take 17 g by mouth as needed. , Disp: , Rfl:  .  Probiotic Product (PHILLIPS COLON HEALTH PO), Take 1 capsule by mouth daily. , Disp: , Rfl:  .  pyridOXINE (VITAMIN B-6) 100 MG tablet, Take 100 mg by mouth daily. , Disp: , Rfl:  .  rosuvastatin (CRESTOR) 10 MG tablet, TAKE 1 TABLET (10 MG TOTAL) BY MOUTH DAILY., Disp: 90 tablet, Rfl: 0 .  sildenafil (REVATIO) 20 MG tablet, Take 2-5 tabs prior to sexual activity, Disp: 50 tablet, Rfl: 1 .  Turmeric 500 MG CAPS, Take by mouth every morning., Disp: , Rfl:  .  vitamin B-12 (CYANOCOBALAMIN) 500 MCG tablet, Take 500 mcg by mouth daily., Disp: , Rfl:   Allergies:  Allergies  Allergen Reactions  . Dexlansoprazole Nausea And Vomiting    Past Medical History, Surgical history, Social history, and Family History were reviewed and updated.  Review of Systems: As above  Physical Exam:  weight is 239 lb (108.4 kg). His oral temperature is 97.7 F (36.5 C). His blood pressure is 139/69 and his pulse is 84. His respiration is 18 and oxygen saturation is 96%.   Well-developed and well-nourished white gentleman. Head and neck exam shows no ocular or oral lesions.  He has no palpable cervical or supraclavicular lymph nodes. Lungs are clear. Cardiac exam regular rate and rhythm with no murmurs rubs or bruits. Abdomen is soft.  He has good bowel sounds. There is no fluid wave. There is no palpable liver or spleen tip. Extremities shows no clubbing cyanosis or edema. Strength is good  bilaterally. Skin exam shows an erythematous-type rash with central clearing in the right inguinal area. There might be a bite mark.  Lab Results  Component Value Date   WBC 5.0 07/09/2016   HGB 14.1 07/09/2016   HCT 44.0 07/09/2016   MCV 83 07/09/2016   PLT 144 (L) 07/09/2016     Chemistry      Component Value Date/Time   NA 147 (H) 06/25/2016 0929   NA 144 05/28/2016 1006   NA 144 12/26/2015 1010   K 4.3 06/25/2016 0929   K 4.0 05/28/2016 1006   K 3.9 12/26/2015 1010   CL 106 06/25/2016 0929   CL 108 05/28/2016 1006   CO2 25 06/25/2016 0929   CO2 25 05/28/2016 1006   CO2 26 12/26/2015 1010   BUN 17 06/25/2016 0929   BUN 18 05/28/2016 1006   BUN 18.2 12/26/2015 1010   CREATININE 1.00 06/25/2016 0929   CREATININE 1.1 05/28/2016 1006   CREATININE 1.0 12/26/2015 1010      Component Value Date/Time   CALCIUM 9.5 06/25/2016 0929   CALCIUM 9.4 05/28/2016 1006   CALCIUM 9.4 12/26/2015 1010   ALKPHOS 82 06/25/2016 0929   ALKPHOS 73 05/28/2016 1006   ALKPHOS 73 12/26/2015 1010   AST 20 06/25/2016 0929   AST 28 05/28/2016 1006   AST 32 12/26/2015 1010   ALT 17 06/25/2016 0929   ALT 21 05/28/2016 1006   ALT 29 12/26/2015 1010   BILITOT 0.7 06/25/2016 0929   BILITOT 0.80 05/28/2016 1006   BILITOT 0.92 12/26/2015 1010         Impression and Plan: Mr. Blazejewski is a 71 year old gentleman with polycythemia.He does not need to be phlebotomized today.  The fatigue might be from low testosterone. I know that his family doctor is watching this for him.  We will plan to get him back to see [place] in another 6 weeks.    Volanda Napoleon, MD 3/30/201811:33 AM

## 2016-08-20 ENCOUNTER — Other Ambulatory Visit (HOSPITAL_BASED_OUTPATIENT_CLINIC_OR_DEPARTMENT_OTHER): Payer: Medicare Other

## 2016-08-20 ENCOUNTER — Ambulatory Visit (HOSPITAL_BASED_OUTPATIENT_CLINIC_OR_DEPARTMENT_OTHER): Payer: Medicare Other | Admitting: Hematology & Oncology

## 2016-08-20 VITALS — BP 122/64 | HR 74 | Temp 98.0°F | Resp 17 | Wt 238.8 lb

## 2016-08-20 DIAGNOSIS — D500 Iron deficiency anemia secondary to blood loss (chronic): Secondary | ICD-10-CM

## 2016-08-20 DIAGNOSIS — D509 Iron deficiency anemia, unspecified: Secondary | ICD-10-CM

## 2016-08-20 DIAGNOSIS — D508 Other iron deficiency anemias: Secondary | ICD-10-CM | POA: Diagnosis not present

## 2016-08-20 DIAGNOSIS — R635 Abnormal weight gain: Secondary | ICD-10-CM | POA: Diagnosis not present

## 2016-08-20 DIAGNOSIS — D45 Polycythemia vera: Secondary | ICD-10-CM

## 2016-08-20 DIAGNOSIS — D696 Thrombocytopenia, unspecified: Secondary | ICD-10-CM

## 2016-08-20 LAB — CBC WITH DIFFERENTIAL (CANCER CENTER ONLY)
BASO#: 0 10*3/uL (ref 0.0–0.2)
BASO%: 0.4 % (ref 0.0–2.0)
EOS ABS: 0.1 10*3/uL (ref 0.0–0.5)
EOS%: 2.7 % (ref 0.0–7.0)
HCT: 44.4 % (ref 38.7–49.9)
HGB: 14.4 g/dL (ref 13.0–17.1)
LYMPH#: 1 10*3/uL (ref 0.9–3.3)
LYMPH%: 20 % (ref 14.0–48.0)
MCH: 27.6 pg — AB (ref 28.0–33.4)
MCHC: 32.4 g/dL (ref 32.0–35.9)
MCV: 85 fL (ref 82–98)
MONO#: 0.3 10*3/uL (ref 0.1–0.9)
MONO%: 6.7 % (ref 0.0–13.0)
NEUT#: 3.4 10*3/uL (ref 1.5–6.5)
NEUT%: 70.2 % (ref 40.0–80.0)
PLATELETS: 107 10*3/uL — AB (ref 145–400)
RBC: 5.21 10*6/uL (ref 4.20–5.70)
RDW: 15.7 % (ref 11.1–15.7)
WBC: 4.8 10*3/uL (ref 4.0–10.0)

## 2016-08-20 LAB — CMP (CANCER CENTER ONLY)
ALT(SGPT): 27 U/L (ref 10–47)
AST: 27 U/L (ref 11–38)
Albumin: 3.5 g/dL (ref 3.3–5.5)
Alkaline Phosphatase: 66 U/L (ref 26–84)
BUN: 17 mg/dL (ref 7–22)
CHLORIDE: 111 meq/L — AB (ref 98–108)
CO2: 26 meq/L (ref 18–33)
Calcium: 9.2 mg/dL (ref 8.0–10.3)
Creat: 0.9 mg/dl (ref 0.6–1.2)
GLUCOSE: 135 mg/dL — AB (ref 73–118)
POTASSIUM: 3.9 meq/L (ref 3.3–4.7)
Sodium: 139 mEq/L (ref 128–145)
Total Bilirubin: 1 mg/dl (ref 0.20–1.60)
Total Protein: 6.1 g/dL — ABNORMAL LOW (ref 6.4–8.1)

## 2016-08-20 LAB — IRON AND TIBC
%SAT: 18 % — AB (ref 20–55)
Iron: 66 ug/dL (ref 42–163)
TIBC: 364 ug/dL (ref 202–409)
UIBC: 298 ug/dL (ref 117–376)

## 2016-08-20 LAB — FERRITIN: FERRITIN: 8 ng/mL — AB (ref 22–316)

## 2016-08-20 LAB — TSH: TSH: 1.663 m[IU]/L (ref 0.320–4.118)

## 2016-08-20 NOTE — Progress Notes (Signed)
Hematology and Oncology Follow Up Visit  Connor [name] [id number] 1946/03/05 71 y.o. 08/20/2016   Principle Diagnosis:   Polycythemia vera-Triple negative  Chronic immune thrombocytopenia vs medication  Transient iron deficiency secondary to phlebotomies  Current Therapy:    Phlebotomy to maintain hematocrit below 45%  Aspirin 81 mg by mouth daily  IV iron as indicated - last dose given on 06/02/2016     Interim History:  [name] is back for followup. He is feeling pretty well today.  He has been working out in the yard. He has had no problems with fever. He's had no cough or shortness of breath. He has had no change in bowel or bladder habits.  Back in March, his iron studies looked okay. His ferritin was 24 with an iron saturation 26%.  He's had no rashes. He's had no leg swelling..  Overall, his performance status is ECOG 1.   Medications:  Current Outpatient Prescriptions:  .  alfuzosin (UROXATRAL) 10 MG 24 hr tablet, Take 10 mg by mouth every morning. , Disp: , Rfl:  .  aspirin 81 MG tablet, Take 81 mg by mouth 2 (two) times daily. , Disp: , Rfl:  .  azithromycin (ZITHROMAX) 250 MG tablet, As directed, Disp: 6 tablet, Rfl: 0 .  Cholecalciferol (VITAMIN D-3) 5000 UNITS TABS, Take 1 tablet by mouth daily. , Disp: , Rfl:  .  Coenzyme Q10 (CO Q 10 PO), Take 1 capsule by mouth daily. , Disp: , Rfl:  .  cyclobenzaprine (FLEXERIL) 10 MG tablet, Take 1 tablet (10 mg total) by mouth 3 (three) times daily as needed for muscle spasms., Disp: 30 tablet, Rfl: 1 .  fish oil-omega-3 fatty acids 1000 MG capsule, Take 2 g by mouth daily. , Disp: , Rfl:  .  Glucosamine-Chondroit-Vit C-Mn (GLUCOSAMINE CHONDR 1500 COMPLX) CAPS, Take by mouth every morning., Disp: , Rfl:  .  Ipratropium-Albuterol (COMBIVENT) 20-100 MCG/ACT AERS respimat, Inhale 1 puff into the lungs as needed for wheezing or shortness of breath., Disp: 1 Inhaler, Rfl: 11 .  losartan (COZAAR) 50 MG tablet, TAKE 1  TABLET (50 MG TOTAL) BY MOUTH DAILY., Disp: 90 tablet, Rfl: 0 .  montelukast (SINGULAIR) 10 MG tablet, TAKE 1 TABLET AT BEDTIME, Disp: 30 tablet, Rfl: 4 .  Multiple Vitamins-Minerals (CENTRUM SILVER ULTRA MENS) TABS, Take 1 tablet by mouth every morning. Once a day, Disp: , Rfl:  .  polyethylene glycol (MIRALAX / GLYCOLAX) packet, Take 17 g by mouth as needed. , Disp: , Rfl:  .  Probiotic Product (PHILLIPS COLON HEALTH PO), Take 1 capsule by mouth daily. , Disp: , Rfl:  .  pyridOXINE (VITAMIN B-6) 100 MG tablet, Take 100 mg by mouth daily. , Disp: , Rfl:  .  rosuvastatin (CRESTOR) 10 MG tablet, TAKE 1 TABLET (10 MG TOTAL) BY MOUTH DAILY., Disp: 90 tablet, Rfl: 0 .  sildenafil (REVATIO) 20 MG tablet, Take 2-5 tabs prior to sexual activity, Disp: 50 tablet, Rfl: 1 .  Turmeric 500 MG CAPS, Take by mouth every morning., Disp: , Rfl:  .  vitamin B-12 (CYANOCOBALAMIN) 500 MCG tablet, Take 500 mcg by mouth daily., Disp: , Rfl:   Allergies:  Allergies  Allergen Reactions  . Dexlansoprazole Nausea And Vomiting    Past Medical History, Surgical history, Social history, and Family History were reviewed and updated.  Review of Systems: As above  Physical Exam:  vitals were not taken for this visit.  Well-developed and well-nourished white gentleman. Head and  neck exam shows no ocular or oral lesions.  He has no palpable cervical or supraclavicular lymph nodes. Lungs are clear. Cardiac exam regular rate and rhythm with no murmurs rubs or bruits. Abdomen is soft.  He has good bowel sounds. There is no fluid wave. There is no palpable liver or spleen tip. Extremities shows no clubbing cyanosis or edema. Strength is good bilaterally. Skin exam shows an erythematous-type rash with central clearing in the right inguinal area. There might be a bite mark.  Lab Results  Component Value Date   WBC 4.8 08/20/2016   HGB 14.4 08/20/2016   HCT 44.4 08/20/2016   MCV 85 08/20/2016   PLT 107 (L) 08/20/2016      Chemistry      Component Value Date/Time   NA 141 07/09/2016 1038   K 4.0 07/09/2016 1038   CL 106 06/25/2016 0929   CL 108 05/28/2016 1006   CO2 22 07/09/2016 1038   BUN 19.0 07/09/2016 1038   CREATININE 0.9 07/09/2016 1038      Component Value Date/Time   CALCIUM 9.5 07/09/2016 1038   ALKPHOS 85 07/09/2016 1038   AST 22 07/09/2016 1038   ALT 22 07/09/2016 1038   BILITOT 0.62 07/09/2016 1038         Impression and Plan: Connor [name] is a 71 year old gentleman with polycythemia.He does not need to be phlebotomized today.  We will hold off on phlebotomizing him today. 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  We'll plan to get him back in about 4 weeks. At that point, we'll see if he needs to be phlebotomized.  He does donate to the [employer]. This might be some that he can do if he feels able.  Volanda Napoleon, MD 5/11/201810:23 AM

## 2016-08-21 LAB — TESTOSTERONE: Testosterone, Serum: 314 ng/dL (ref 264–916)

## 2016-08-28 ENCOUNTER — Other Ambulatory Visit: Payer: Self-pay | Admitting: Family Medicine

## 2016-08-28 ENCOUNTER — Other Ambulatory Visit: Payer: Self-pay | Admitting: Cardiovascular Disease

## 2016-08-30 NOTE — Telephone Encounter (Signed)
Rx(s) sent to pharmacy electronically.  

## 2016-09-20 ENCOUNTER — Ambulatory Visit: Payer: Medicare Other | Admitting: Hematology & Oncology

## 2016-09-20 ENCOUNTER — Other Ambulatory Visit: Payer: Medicare Other

## 2016-10-25 ENCOUNTER — Other Ambulatory Visit: Payer: Self-pay | Admitting: Hematology & Oncology

## 2016-10-27 DIAGNOSIS — C300 Malignant neoplasm of nasal cavity: Secondary | ICD-10-CM | POA: Diagnosis not present

## 2016-11-08 ENCOUNTER — Ambulatory Visit: Payer: Medicare Other | Admitting: Family Medicine

## 2016-11-10 ENCOUNTER — Ambulatory Visit (HOSPITAL_BASED_OUTPATIENT_CLINIC_OR_DEPARTMENT_OTHER): Payer: Medicare Other | Admitting: Hematology & Oncology

## 2016-11-10 ENCOUNTER — Other Ambulatory Visit (HOSPITAL_BASED_OUTPATIENT_CLINIC_OR_DEPARTMENT_OTHER): Payer: Medicare Other

## 2016-11-10 VITALS — BP 126/69 | HR 64 | Temp 98.3°F | Resp 16 | Wt 238.0 lb

## 2016-11-10 DIAGNOSIS — D45 Polycythemia vera: Secondary | ICD-10-CM

## 2016-11-10 DIAGNOSIS — D696 Thrombocytopenia, unspecified: Secondary | ICD-10-CM

## 2016-11-10 DIAGNOSIS — D500 Iron deficiency anemia secondary to blood loss (chronic): Secondary | ICD-10-CM

## 2016-11-10 LAB — CMP (CANCER CENTER ONLY)
ALK PHOS: 58 U/L (ref 26–84)
ALT: 25 U/L (ref 10–47)
AST: 27 U/L (ref 11–38)
Albumin: 3.5 g/dL (ref 3.3–5.5)
BUN, Bld: 16 mg/dL (ref 7–22)
CALCIUM: 9.1 mg/dL (ref 8.0–10.3)
CHLORIDE: 107 meq/L (ref 98–108)
CO2: 29 mEq/L (ref 18–33)
Creat: 1.1 mg/dl (ref 0.6–1.2)
Glucose, Bld: 103 mg/dL (ref 73–118)
POTASSIUM: 4.5 meq/L (ref 3.3–4.7)
Sodium: 139 mEq/L (ref 128–145)
Total Bilirubin: 0.8 mg/dl (ref 0.20–1.60)
Total Protein: 6.2 g/dL — ABNORMAL LOW (ref 6.4–8.1)

## 2016-11-10 LAB — CBC WITH DIFFERENTIAL (CANCER CENTER ONLY)
BASO#: 0 10*3/uL (ref 0.0–0.2)
BASO%: 0.5 % (ref 0.0–2.0)
EOS ABS: 0.2 10*3/uL (ref 0.0–0.5)
EOS%: 3.6 % (ref 0.0–7.0)
HEMATOCRIT: 44.1 % (ref 38.7–49.9)
HGB: 14.3 g/dL (ref 13.0–17.1)
LYMPH#: 1.1 10*3/uL (ref 0.9–3.3)
LYMPH%: 25.3 % (ref 14.0–48.0)
MCH: 27.3 pg — AB (ref 28.0–33.4)
MCHC: 32.4 g/dL (ref 32.0–35.9)
MCV: 84 fL (ref 82–98)
MONO#: 0.4 10*3/uL (ref 0.1–0.9)
MONO%: 9.6 % (ref 0.0–13.0)
NEUT#: 2.7 10*3/uL (ref 1.5–6.5)
NEUT%: 61 % (ref 40.0–80.0)
PLATELETS: 121 10*3/uL — AB (ref 145–400)
RBC: 5.24 10*6/uL (ref 4.20–5.70)
RDW: 14.2 % (ref 11.1–15.7)
WBC: 4.4 10*3/uL (ref 4.0–10.0)
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
## 2016-11-11 LAB — IRON AND TIBC
%SAT: 18 % — AB (ref 20–55)
IRON: 64 ug/dL (ref 42–163)
TIBC: 358 ug/dL (ref 202–409)
UIBC: 294 ug/dL (ref 117–376)

## 2016-11-11 LAB — FERRITIN: Ferritin: 7 ng/ml — ABNORMAL LOW (ref 22–316)

## 2016-11-16 ENCOUNTER — Ambulatory Visit (INDEPENDENT_AMBULATORY_CARE_PROVIDER_SITE_OTHER): Payer: Medicare Other | Admitting: Family

## 2016-11-16 ENCOUNTER — Encounter: Payer: Self-pay | Admitting: Family

## 2016-11-16 VITALS — BP 122/72 | HR 80 | Temp 97.6°F | Ht 71.5 in | Wt 237.4 lb

## 2016-11-16 DIAGNOSIS — B349 Viral infection, unspecified: Secondary | ICD-10-CM

## 2016-11-16 DIAGNOSIS — R509 Fever, unspecified: Secondary | ICD-10-CM | POA: Diagnosis not present

## 2016-11-16 NOTE — Progress Notes (Signed)
   Subjective:    Patient ID: Connor [name], male    DOB: 1945-12-29, 71 y.o.   MRN: [id number]  Fever   This is a new problem. The current episode started in the past 7 days. The problem occurs constantly. The problem has been unchanged. The maximum temperature noted was 99 to 99.9 F. Associated symptoms include headaches, muscle aches and sleepiness. Pertinent negatives include no abdominal pain, congestion, coughing, ear pain, nausea, sore throat, urinary pain or wheezing. Associated symptoms comments: Chills . He has tried acetaminophen for the symptoms. The treatment provided mild relief.      Review of Systems  Constitutional: Positive for fever.  HENT: Negative for congestion, ear pain and sore throat.   Respiratory: Negative for cough and wheezing.   Gastrointestinal: Negative for abdominal pain and nausea.  Genitourinary: Negative for dysuria.  Neurological: Positive for headaches.  All other systems reviewed and are negative.      Objective:   Physical Exam  Constitutional: He is oriented to person, place, and time. He appears well-developed and well-nourished. No distress.  HENT:  Head: Normocephalic.  Right Ear: External ear normal.  Left Ear: External ear normal.  Nose: Nose normal.  Mouth/Throat: Oropharynx is clear and moist.  Eyes: Pupils are equal, round, and reactive to light. Right eye exhibits no discharge. Left eye exhibits no discharge.  Neck: Normal range of motion. Neck supple. No thyromegaly present.  Cardiovascular: Normal rate, regular rhythm, normal heart sounds and intact distal pulses.   No murmur heard. Pulmonary/Chest: Effort normal and breath sounds normal. No respiratory distress. He has no wheezes.  Abdominal: Soft. Bowel sounds are normal. He exhibits no distension. There is no tenderness.  Musculoskeletal: Normal range of motion. He exhibits no edema or tenderness.  Neurological: He is alert and oriented to person, place, and time.  Skin:  Skin is warm and dry. No rash noted. No erythema.  Psychiatric: He has a normal mood and affect. His behavior is normal. Judgment and thought content normal.  Vitals reviewed.     BP 122/72   Pulse 80   Temp 97.6 F (36.4 C) (Oral)   Ht 5' 11.5" (1.816 m)   Wt 237 lb 6.4 oz (107.7 kg)   BMI 32.65 kg/m      Assessment & Plan:  1. Fever, unspecified fever cause - CBC with Differential/Platelet - Lyme Ab/Western Blot Reflex - Rocky mtn spotted fvr abs pnl(IgG+IgM)  2. Viral illness Rest Force fluids Tylenol as needed Report any abdominal pain or UTI symptoms   Will do lab work to rule out tick related to fever  Evelina Dun, FNP

## 2016-11-16 NOTE — Patient Instructions (Signed)
Fever, Adult °A fever is an increase in the body’s temperature. It is usually defined as a temperature of 100°F (38°C) or higher. Brief mild or moderate fevers generally have no long-term effects, and they often do not require treatment. Moderate or high fevers may make you feel uncomfortable and can sometimes be a sign of a serious illness or disease. The sweating that may occur with repeated or prolonged fever may also cause dehydration. °Fever is confirmed by taking a temperature with a thermometer. A measured temperature can vary with: °· Age. °· Time of day. °· Location of the thermometer: °¨ Mouth (oral). °¨ Rectum (rectal). °¨ Ear (tympanic). °¨ Underarm (axillary). °¨ Forehead (temporal). °Follow these instructions at home: °Pay attention to any changes in your symptoms. Take these actions to help with your condition: °· Take over-the counter and prescription medicines only as told by your health care provider. Follow the dosing instructions carefully. °· If you were prescribed an antibiotic medicine, take it as told by your health care provider. Do not stop taking the antibiotic even if you start to feel better. °· Rest as needed. °· Drink enough fluid to keep your urine clear or pale yellow. This helps to prevent dehydration. °· Sponge yourself or bathe with room-temperature water to help reduce your body temperature as needed. Do not use ice water. °· Do not overbundle yourself in blankets or heavy clothes. °Contact a health care provider if: °· You vomit. °· You cannot eat or drink without vomiting. °· You have diarrhea. °· You have pain when you urinate. °· Your symptoms do not improve with treatment. °· You develop new symptoms. °· You develop excessive weakness. °Get help right away if: °· You have shortness of breath or have trouble breathing. °· You are dizzy or you faint. °· You are disoriented or confused. °· You develop signs of dehydration, such as a dry mouth, decreased urination, or  paleness. °· You develop severe pain in your abdomen. °· You have persistent vomiting or diarrhea. °· You develop a skin rash. °· Your symptoms suddenly get worse. °This information is not intended to replace advice given to you by your health care provider. Make sure you discuss any questions you have with your health care provider. °Document Released: 09/22/2000 Document Revised: 09/04/2015 Document Reviewed: 05/23/2014 °Elsevier Interactive Patient Education © 2017 [employer] ° °

## 2016-11-18 LAB — CBC WITH DIFFERENTIAL/PLATELET
Basophils Absolute: 0 10*3/uL (ref 0.0–0.2)
Basos: 0 %
EOS (ABSOLUTE): 0 10*3/uL (ref 0.0–0.4)
EOS: 1 %
HEMATOCRIT: 45.2 % (ref 37.5–51.0)
Hemoglobin: 15 g/dL (ref 13.0–17.7)
IMMATURE GRANS (ABS): 0 10*3/uL (ref 0.0–0.1)
IMMATURE GRANULOCYTES: 0 %
LYMPHS: 18 %
Lymphocytes Absolute: 0.6 10*3/uL — ABNORMAL LOW (ref 0.7–3.1)
MCH: 27.2 pg (ref 26.6–33.0)
MCHC: 33.2 g/dL (ref 31.5–35.7)
MCV: 82 fL (ref 79–97)
MONOCYTES: 13 %
Monocytes Absolute: 0.4 10*3/uL (ref 0.1–0.9)
NEUTROS ABS: 2.2 10*3/uL (ref 1.4–7.0)
Neutrophils: 68 %
PLATELETS: 94 10*3/uL — AB (ref 150–379)
RBC: 5.52 x10E6/uL (ref 4.14–5.80)
RDW: 14.9 % (ref 12.3–15.4)
WBC: 3.2 10*3/uL — ABNORMAL LOW (ref 3.4–10.8)

## 2016-11-18 LAB — LYME AB/WESTERN BLOT REFLEX: Lyme IgG/IgM Ab: <0.91 {ISR} (ref 0.00–0.90)

## 2016-11-18 LAB — ROCKY MTN SPOTTED FVR ABS PNL(IGG+IGM)
RMSF IGM: 0.13 {index} (ref 0.00–0.89)
RMSF IgG: NEGATIVE

## 2016-11-19 ENCOUNTER — Other Ambulatory Visit: Payer: Self-pay | Admitting: Family

## 2016-11-19 DIAGNOSIS — R7989 Other specified abnormal findings of blood chemistry: Secondary | ICD-10-CM

## 2016-11-19 DIAGNOSIS — R61 Generalized hyperhidrosis: Secondary | ICD-10-CM

## 2016-11-19 NOTE — Progress Notes (Signed)
New referral to hematology unnecessary. Patient sees hematology/oncology already. Connor Gauze, MD. Has a follow up appt in September scheduled. I forwarded this lab result to Dr Marin Olp with a note that his platelets have dropped from 121 nine days ago to 94 four days ago. He has an active dx of thromboyctopenia.   Left detailed message on patient's voicemail since it is Friday evening. Advised that RMSF and Lymes were neg and that platelets were a little lower than usual and that I forwarded them to [facility] for review and recommendation. If he has any questions he can call us back or contact Dr Marin Olp.

## 2016-11-26 ENCOUNTER — Other Ambulatory Visit: Payer: Self-pay | Admitting: Family Medicine

## 2016-12-06 ENCOUNTER — Other Ambulatory Visit: Payer: Medicare Other

## 2016-12-06 DIAGNOSIS — I10 Essential (primary) hypertension: Secondary | ICD-10-CM

## 2016-12-06 DIAGNOSIS — D696 Thrombocytopenia, unspecified: Secondary | ICD-10-CM | POA: Diagnosis not present

## 2016-12-06 DIAGNOSIS — N400 Enlarged prostate without lower urinary tract symptoms: Secondary | ICD-10-CM

## 2016-12-06 DIAGNOSIS — E7800 Pure hypercholesterolemia, unspecified: Secondary | ICD-10-CM | POA: Diagnosis not present

## 2016-12-06 DIAGNOSIS — E559 Vitamin D deficiency, unspecified: Secondary | ICD-10-CM | POA: Diagnosis not present

## 2016-12-07 LAB — VITAMIN D 25 HYDROXY (VIT D DEFICIENCY, FRACTURES): Vit D, 25-Hydroxy: 58.8 ng/mL (ref 30.0–100.0)

## 2016-12-07 LAB — CBC WITH DIFFERENTIAL/PLATELET
BASOS: 1 %
Basophils Absolute: 0.1 10*3/uL (ref 0.0–0.2)
EOS (ABSOLUTE): 0.1 10*3/uL (ref 0.0–0.4)
EOS: 2 %
HEMATOCRIT: 47.3 % (ref 37.5–51.0)
HEMOGLOBIN: 14.8 g/dL (ref 13.0–17.7)
IMMATURE GRANULOCYTES: 0 %
Immature Grans (Abs): 0 10*3/uL (ref 0.0–0.1)
Lymphocytes Absolute: 1.1 10*3/uL (ref 0.7–3.1)
Lymphs: 26 %
MCH: 26.6 pg (ref 26.6–33.0)
MCHC: 31.3 g/dL — ABNORMAL LOW (ref 31.5–35.7)
MCV: 85 fL (ref 79–97)
MONOCYTES: 9 %
MONOS ABS: 0.4 10*3/uL (ref 0.1–0.9)
NEUTROS PCT: 62 %
Neutrophils Absolute: 2.6 10*3/uL (ref 1.4–7.0)
Platelets: 161 10*3/uL (ref 150–379)
RBC: 5.56 x10E6/uL (ref 4.14–5.80)
RDW: 15.7 % — ABNORMAL HIGH (ref 12.3–15.4)
WBC: 4.2 10*3/uL (ref 3.4–10.8)

## 2016-12-07 LAB — BMP8+EGFR
BUN/Creatinine Ratio: 17 (ref 10–24)
BUN: 17 mg/dL (ref 8–27)
CALCIUM: 9.2 mg/dL (ref 8.6–10.2)
CO2: 24 mmol/L (ref 20–29)
CREATININE: 1.03 mg/dL (ref 0.76–1.27)
Chloride: 106 mmol/L (ref 96–106)
GFR, EST AFRICAN AMERICAN: 83 mL/min/{1.73_m2} (ref >59)
GFR, EST NON AFRICAN AMERICAN: 72 mL/min/{1.73_m2} (ref >59)
Glucose: 112 mg/dL — ABNORMAL HIGH (ref 65–99)
Potassium: 4.7 mmol/L (ref 3.5–5.2)
Sodium: 143 mmol/L (ref 134–144)

## 2016-12-07 LAB — LIPID PANEL
CHOL/HDL RATIO: 3.1 ratio (ref 0.0–5.0)
Cholesterol, Total: 106 mg/dL (ref 100–199)
HDL: 34 mg/dL — AB (ref >39)
LDL CALC: 53 mg/dL (ref 0–99)
TRIGLYCERIDES: 97 mg/dL (ref 0–149)
VLDL CHOLESTEROL CAL: 19 mg/dL (ref 5–40)

## 2016-12-07 LAB — HEPATIC FUNCTION PANEL
ALBUMIN: 4.2 g/dL (ref 3.5–4.8)
ALK PHOS: 68 IU/L (ref 39–117)
ALT: 15 IU/L (ref 0–44)
AST: 22 IU/L (ref 0–40)
Bilirubin Total: 0.6 mg/dL (ref 0.0–1.2)
Bilirubin, Direct: 0.19 mg/dL (ref 0.00–0.40)
Total Protein: 6 g/dL (ref 6.0–8.5)

## 2016-12-22 ENCOUNTER — Ambulatory Visit (HOSPITAL_BASED_OUTPATIENT_CLINIC_OR_DEPARTMENT_OTHER): Payer: Medicare Other | Admitting: Hematology & Oncology

## 2016-12-22 ENCOUNTER — Ambulatory Visit (HOSPITAL_BASED_OUTPATIENT_CLINIC_OR_DEPARTMENT_OTHER): Payer: Medicare Other

## 2016-12-22 ENCOUNTER — Other Ambulatory Visit (HOSPITAL_BASED_OUTPATIENT_CLINIC_OR_DEPARTMENT_OTHER): Payer: Medicare Other

## 2016-12-22 ENCOUNTER — Ambulatory Visit (INDEPENDENT_AMBULATORY_CARE_PROVIDER_SITE_OTHER): Payer: Medicare Other | Admitting: Family Medicine

## 2016-12-22 ENCOUNTER — Encounter: Payer: Self-pay | Admitting: Family Medicine

## 2016-12-22 VITALS — BP 109/61 | HR 82 | Temp 97.4°F | Ht 71.5 in | Wt 238.0 lb

## 2016-12-22 VITALS — BP 135/66 | HR 69

## 2016-12-22 VITALS — BP 134/68 | HR 77 | Temp 97.8°F | Resp 17 | Wt 238.0 lb

## 2016-12-22 DIAGNOSIS — E7800 Pure hypercholesterolemia, unspecified: Secondary | ICD-10-CM | POA: Diagnosis not present

## 2016-12-22 DIAGNOSIS — N400 Enlarged prostate without lower urinary tract symptoms: Secondary | ICD-10-CM

## 2016-12-22 DIAGNOSIS — D45 Polycythemia vera: Secondary | ICD-10-CM

## 2016-12-22 DIAGNOSIS — E559 Vitamin D deficiency, unspecified: Secondary | ICD-10-CM

## 2016-12-22 DIAGNOSIS — D696 Thrombocytopenia, unspecified: Secondary | ICD-10-CM | POA: Diagnosis not present

## 2016-12-22 DIAGNOSIS — C300 Malignant neoplasm of nasal cavity: Secondary | ICD-10-CM | POA: Insufficient documentation

## 2016-12-22 DIAGNOSIS — J449 Chronic obstructive pulmonary disease, unspecified: Secondary | ICD-10-CM

## 2016-12-22 DIAGNOSIS — D501 Sideropenic dysphagia: Secondary | ICD-10-CM

## 2016-12-22 DIAGNOSIS — D508 Other iron deficiency anemias: Secondary | ICD-10-CM

## 2016-12-22 DIAGNOSIS — C44321 Squamous cell carcinoma of skin of nose: Secondary | ICD-10-CM | POA: Diagnosis not present

## 2016-12-22 DIAGNOSIS — I10 Essential (primary) hypertension: Secondary | ICD-10-CM | POA: Diagnosis not present

## 2016-12-22 DIAGNOSIS — N5201 Erectile dysfunction due to arterial insufficiency: Secondary | ICD-10-CM | POA: Diagnosis not present

## 2016-12-22 DIAGNOSIS — R5383 Other fatigue: Secondary | ICD-10-CM

## 2016-12-22 LAB — CBC WITH DIFFERENTIAL (CANCER CENTER ONLY)
BASO#: 0 10*3/uL (ref 0.0–0.2)
BASO%: 0.2 % (ref 0.0–2.0)
EOS ABS: 0.2 10*3/uL (ref 0.0–0.5)
EOS%: 3.4 % (ref 0.0–7.0)
HCT: 46.6 % (ref 38.7–49.9)
HEMOGLOBIN: 15.1 g/dL (ref 13.0–17.1)
LYMPH#: 1.1 10*3/uL (ref 0.9–3.3)
LYMPH%: 23 % (ref 14.0–48.0)
MCH: 27.2 pg — ABNORMAL LOW (ref 28.0–33.4)
MCHC: 32.4 g/dL (ref 32.0–35.9)
MCV: 84 fL (ref 82–98)
MONO#: 0.3 10*3/uL (ref 0.1–0.9)
MONO%: 6 % (ref 0.0–13.0)
NEUT#: 3.1 10*3/uL (ref 1.5–6.5)
NEUT%: 67.4 % (ref 40.0–80.0)
PLATELETS: 121 10*3/uL — AB (ref 145–400)
RBC: 5.56 10*6/uL (ref 4.20–5.70)
RDW: 15.5 % (ref 11.1–15.7)
WBC: 4.7 10*3/uL (ref 4.0–10.0)

## 2016-12-22 LAB — FERRITIN: FERRITIN: 8 ng/mL — AB (ref 22–316)

## 2016-12-22 LAB — CMP (CANCER CENTER ONLY)
ALBUMIN: 3.6 g/dL (ref 3.3–5.5)
ALT: 23 U/L (ref 10–47)
AST: 27 U/L (ref 11–38)
Alkaline Phosphatase: 64 U/L (ref 26–84)
BUN: 19 mg/dL (ref 7–22)
CO2: 27 mEq/L (ref 18–33)
CREATININE: 1 mg/dL (ref 0.6–1.2)
Calcium: 9.3 mg/dL (ref 8.0–10.3)
Chloride: 111 mEq/L — ABNORMAL HIGH (ref 98–108)
Glucose, Bld: 124 mg/dL — ABNORMAL HIGH (ref 73–118)
Potassium: 3.9 mEq/L (ref 3.3–4.7)
SODIUM: 142 meq/L (ref 128–145)
TOTAL PROTEIN: 6.2 g/dL — AB (ref 6.4–8.1)
Total Bilirubin: 0.9 mg/dl (ref 0.20–1.60)

## 2016-12-22 LAB — IRON AND TIBC
%SAT: 19 % — ABNORMAL LOW (ref 20–55)
IRON: 69 ug/dL (ref 42–163)
TIBC: 365 ug/dL (ref 202–409)
UIBC: 296 ug/dL (ref 117–376)

## 2016-12-22 MED ORDER — IPRATROPIUM-ALBUTEROL 20-100 MCG/ACT IN AERS: 1.0000 | INHALATION_SPRAY | RESPIRATORY_TRACT | 11 refills | Status: DC | PRN

## 2016-12-22 NOTE — Patient Instructions (Signed)
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
## 2016-12-22 NOTE — Progress Notes (Signed)
Modesta Messing presents today for phlebotomy per MD orders. Phlebotomy procedure started at 1211 and ended at 1312 via 20 g angio cath to left ac.  One unsuccessful attempt with phlebotomy kit to right ac.  380 grams removed, until angio cath clotted.  Patient refused another iv start to obtain goal of 500 grams.   Patient refused to stay for 30 minute observation. Patient tolerated procedure well. IV needle removed intact.  VS stable and pressure dressing clean, dry and intact to left ac at time of discharge.

## 2016-12-22 NOTE — Progress Notes (Signed)
Subjective:    Patient ID: Connor [name], male    DOB: [birth year], 71 y.o.   MRN: [id number]  HPI Pt here for follow up and management of chronic medical problems which includes hyperlipidemia and hypertension. He is taking medication regularly.The patient is doing well overall with his only complaint being fatigue. He comes to the visit today with his wife. He is followed regularly by the hematologist. The patient complains of generally fatigued. He is requesting a refill on his Combivent. He will be given an FOBT to return. He is had lab work done and this will be reviewed with him during the visit today. All of his cholesterol numbers with traditional lipid testing are excellent except the good cholesterol is low and this is been the case for him consistently in the past. Triglycerides are good at 97. The LDL C cholesterol was excellent at 53. The CBC had a normal white blood cell count. The hemoglobin was good at 14.8 and the platelet count was adequate. The blood sugar slightly elevated at 112 has been higher than this in the past. The creatinine, the most important kidney function test was normal along with all of the electrolytes including potassium. Vitamin D level remains good at 58.8. All liver function tests were normal. The patient is pleasant and alert and just had a phlebotomy done today because of his polycythemia there. He denies any chest pain or shortness of breath. He does get fatigued easily and has problems with itching and sweating and attributes a lot of this to his elevated hematocrit and polycythemia. He denies any shortness of breath and denies any trouble with his intestinal tract including nausea vomiting diarrhea blood in the stool or black tarry bowel movements. His last colonoscopy was good and he was told he did not need another colonoscopy for 10 years and this would put him around February 2024. He's passing his water without problems. He also wonders if some of his fatigue  could be due to the low testosterone. I mentioned to him that testosterone could potentially run up the hemoglobin and hematocrit he should talk to his hematologist about this at the next visit.    Patient Active Problem List   Diagnosis Date Noted  . 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 Iron malabsorption 05/28/2016  . Hyperlipidemia LDL goal <100 04/12/2014  . Polycythemia vera ([place]) 04/19/2013  . Family history of early CAD 02/19/2013  . Iron deficiency anemia 09/01/2012  . H/O vitamin D deficiency 05/21/2012  . Diverticulosis of colon 05/21/2012  . Carotid artery stenosis, asymptomatic 05/21/2012  . BPH (benign prostatic hyperplasia)   . Cardiomegaly   . Thrombocytopenia ([place])   . Lap Nissen Fundoplication August 5784 11/26/2011  . Weight gain, abnormal 08/27/2009  . ESSENTIAL HYPERTENSION, BENIGN 08/27/2009  . OSA (obstructive sleep apnea) 02/19/2008  . COPD, mild ([place]) 05/26/2007   Outpatient Encounter Prescriptions as of 12/22/2016  Medication Sig  . alfuzosin (UROXATRAL) 10 MG 24 hr tablet Take 10 mg by mouth every morning.   [name] aspirin 81 MG tablet Take 81 mg by mouth 2 (two) times daily.   . Cholecalciferol (VITAMIN D-3) 5000 UNITS TABS Take 1 tablet by mouth daily.   . Coenzyme Q10 (CO Q 10 PO) Take 1 capsule by mouth daily.   . cyclobenzaprine (FLEXERIL) 10 MG tablet TAKE 1 TABLET (10 MG TOTAL) BY MOUTH 3 (THREE) TIMES DAILY AS NEEDED FOR MUSCLE SPASMS.  . fish oil-omega-3 fatty acids 1000 MG capsule  Take 2 g by mouth daily.   . Glucosamine-Chondroit-Vit C-Mn (GLUCOSAMINE CHONDR 1500 COMPLX) CAPS Take by mouth every morning.  . Ipratropium-Albuterol (COMBIVENT) 20-100 MCG/ACT AERS respimat Inhale 1 puff into the lungs as needed for wheezing or shortness of breath.  . losartan (COZAAR) 50 MG tablet TAKE 1 TABLET (50 MG TOTAL) BY MOUTH DAILY.  . montelukast (SINGULAIR) 10 MG tablet TAKE 1 TABLET AT BEDTIME  . Multiple Vitamins-Minerals (CENTRUM SILVER ULTRA MENS)  TABS Take 1 tablet by mouth every morning. Once a day  . polyethylene glycol (MIRALAX / GLYCOLAX) packet Take 17 g by mouth as needed.   . Probiotic Product (PHILLIPS COLON HEALTH PO) Take 1 capsule by mouth daily.   [name] pyridOXINE (VITAMIN B-6) 100 MG tablet Take 100 mg by mouth daily.   . rosuvastatin (CRESTOR) 10 MG tablet TAKE 1 TABLET (10 MG TOTAL) BY MOUTH DAILY.  . sildenafil (REVATIO) 20 MG tablet Take 2-5 tabs prior to sexual activity  . Turmeric 500 MG CAPS Take by mouth every morning.  . vitamin B-12 (CYANOCOBALAMIN) 500 MCG tablet Take 500 mcg by mouth daily.   No facility-administered encounter medications on file as of 12/22/2016.       Review of Systems  Constitutional: Positive for fatigue.  HENT: Negative.   Eyes: Negative.   Respiratory: Negative.   Cardiovascular: Negative.   Gastrointestinal: Negative.   Endocrine: Negative.   Genitourinary: Negative.   Musculoskeletal: Negative.   Skin: Negative.   Allergic/Immunologic: Negative.   Neurological: Negative.   Hematological: Negative.   Psychiatric/Behavioral: Negative.        Objective:   Physical Exam  Constitutional: He is oriented to person, place, and time. He appears well-developed and well-nourished. No distress.  Patient is pleasant and relaxed and has just visited with the hematologist today and had phlebotomy for his polycythemia.  HENT:  Head: Normocephalic and atraumatic.  Right Ear: External ear normal.  Left Ear: External ear normal.  Nose: Nose normal.  Mouth/Throat: Oropharynx is clear and moist. No oropharyngeal exudate.  Eyes: Pupils are equal, round, and reactive to light. Conjunctivae and EOM are normal. Right eye exhibits no discharge. Left eye exhibits no discharge. No scleral icterus.  Neck: Normal range of motion. Neck supple. No thyromegaly present.  No bruits or thyromegaly  Cardiovascular: Normal rate, regular rhythm, normal heart sounds and intact distal pulses.   No murmur  heard. Heart has a regular rate and rhythm at 72/m  Pulmonary/Chest: Effort normal and breath sounds normal. No respiratory distress. He has no wheezes. He has no rales. He exhibits no tenderness.  Clear anteriorly and posteriorly and no axillary adenopathy  Abdominal: Soft. Bowel sounds are normal. He exhibits no mass. There is no tenderness. There is no rebound and no guarding.  Nontender without liver or spleen enlargement bruits masses or inguinal adenopathy  Genitourinary:  Genitourinary Comments: The patient sees Dr. McDiarmid regularly for his rectal exam and prostate exam.  Musculoskeletal: Normal range of motion. He exhibits no edema.  Lymphadenopathy:    He has no cervical adenopathy.  Neurological: He is alert and oriented to person, place, and time. He has normal reflexes. No cranial nerve deficit.  Skin: Skin is warm and dry. No rash noted.  Psychiatric: He has a normal mood and affect. His behavior is normal. Judgment and thought content normal.  Nursing note and vitals reviewed.  BP 109/61 (BP Location: Left Arm)   Pulse 82   Temp (!) 97.4 F (36.3 C) (Oral)  Ht 5' 11.5" (1.816 m)   Wt 238 lb (108 kg)   BMI 32.73 kg/m         Assessment & Plan:  1. Thrombocytopenia ([place]) -Continue to follow-up with hematologist as planned and get periodic phlebotomies - Ipratropium-Albuterol (COMBIVENT) 20-100 MCG/ACT AERS respimat; Inhale 1 puff into the lungs as needed for wheezing or shortness of breath.  Dispense: 1 Inhaler; Refill: 11  2. Essential hypertension, benign -The blood pressure is good today and he will continue with current treatment  3. Pure hypercholesterolemia -Cholesterol numbers were excellent and if they continue to run as low as they are running at the next visit we may consider to repeat these the Crestor slightly.  4. Vitamin D deficiency -Continue vitamin D replacement  5. Benign prostatic hyperplasia, unspecified whether lower urinary tract  symptoms present -Continue follow-up with the urologist  6. Squamous cell cancer of skin of nose -Continue to follow-up with the dermatologist or ear nose and throat specialist whoever is following this.  7. COPD, mild ([place]) -Continue with when necessary use of inhalers and avoiding irritating environments  8. Polycythemia vera ([place]) -Follow-up with hematology  9. Other iron deficiency anemia -Follow-up with hematology  10. Erectile dysfunction due to arterial insufficiency -Discuss testosterone replacement with hematologist and we can certainly pursue trying this again if it is okay with him and your hemoglobin.  11. Fatigue, unspecified type - Thyroid Panel With TSH  Meds ordered this encounter  Medications  . Ipratropium-Albuterol (COMBIVENT) 20-100 MCG/ACT AERS respimat    Sig: Inhale 1 puff into the lungs as needed for wheezing or shortness of breath.    Dispense:  1 Inhaler    Refill:  11   Patient Instructions                       Medicare Annual Wellness Visit  [place] and the medical providers at [facility] strive to bring you the best medical care.  In doing so we not only want to address your current medical conditions and concerns but also to detect new conditions early and prevent illness, disease and health-related problems.    Medicare offers a yearly Wellness Visit which allows our clinical staff to assess your need for preventative services including immunizations, lifestyle education, counseling to decrease risk of preventable diseases and screening for fall risk and other medical concerns.    This visit is provided free of charge (no copay) for all Medicare recipients. The clinical pharmacists at [facility] have begun to conduct these Wellness Visits which will also include a thorough review of all your medications.    As you primary medical provider recommend that you make an appointment for your Annual  Wellness Visit if you have not done so already this year.  You may set up this appointment before you leave today or you may call back ([phone]) and schedule an appointment.  Please make sure when you call that you mention that you are scheduling your Annual Wellness Visit with the clinical pharmacist so that the appointment may be made for the proper length of time.     Continue current medications. Continue good therapeutic lifestyle changes which include good diet and exercise. Fall precautions discussed with patient. If an FOBT was given today- please return it to our front desk. If you are over 36 years old - you may need Prevnar 31 or the adult Pneumonia vaccine.  **Flu shots are available--- please  call and schedule a FLU-CLINIC appointment**  After your visit with [place] today you will receive a survey in the mail or online from [employer] regarding your care with [place]. Please take a moment to fill this out. Your feedback is very important to [place] as you can help [place] better understand your patient needs as well as improve your experience and satisfaction. WE CARE ABOUT YOU!!!   Do not forget to get your flu shot by the end of October Continue to follow-up with hematology Continue with inhalers as needed and current blood pressure medication Continue with vitamin D replacement pending results of lab work Continue with Crestor pending results of lab work Discuss testosterone replacement with hematologist. Please come back in the next couple weeks and get a thyroid profile Discuss with the hematologist also the new shingrix vaccine     Arrie Senate MD

## 2016-12-22 NOTE — Addendum Note (Signed)
Addended by: Zannie Cove on: 12/22/2016 03:48 PM   Modules accepted: Orders

## 2016-12-22 NOTE — Patient Instructions (Addendum)
Medicare Annual Wellness Visit  [facility] and the medical providers at [facility] strive to bring you the best medical care.  In doing so we not only want to address your current medical conditions and concerns but also to detect new conditions early and prevent illness, disease and health-related problems.    Medicare offers a yearly Wellness Visit which allows our clinical staff to assess your need for preventative services including immunizations, lifestyle education, counseling to decrease risk of preventable diseases and screening for fall risk and other medical concerns.    This visit is provided free of charge (no copay) for all Medicare recipients. The clinical pharmacists at [facility] have begun to conduct these Wellness Visits which will also include a thorough review of all your medications.    As you primary medical provider recommend that you make an appointment for your Annual Wellness Visit if you have not done so already this year.  You may set up this appointment before you leave today or you may call back ([phone]) and schedule an appointment.  Please make sure when you call that you mention that you are scheduling your Annual Wellness Visit with the clinical pharmacist so that the appointment may be made for the proper length of time.     Continue current medications. Continue good therapeutic lifestyle changes which include good diet and exercise. Fall precautions discussed with patient. If an FOBT was given today- please return it to our front desk. If you are over 66 years old - you may need Prevnar 30 or the adult Pneumonia vaccine.  **Flu shots are available--- please call and schedule a FLU-CLINIC appointment**  After your visit with [place] today you will receive a survey in the mail or online from [employer] regarding your care with [place]. Please take a moment to fill this out. Your feedback is very  important to [place] as you can help [place] better understand your patient needs as well as improve your experience and satisfaction. WE CARE ABOUT YOU!!!   Do not forget to get your flu shot by the end of October Continue to follow-up with hematology Continue with inhalers as needed and current blood pressure medication Continue with vitamin D replacement pending results of lab work Continue with Crestor pending results of lab work Discuss testosterone replacement with hematologist. Please come back in the next couple weeks and get a thyroid profile Discuss with the hematologist also the new shingrix vaccine

## 2016-12-23 DIAGNOSIS — M25552 Pain in left hip: Secondary | ICD-10-CM | POA: Diagnosis not present

## 2017-01-03 ENCOUNTER — Other Ambulatory Visit: Payer: Medicare Other

## 2017-01-03 DIAGNOSIS — R5383 Other fatigue: Secondary | ICD-10-CM | POA: Diagnosis not present

## 2017-01-04 LAB — THYROID PANEL WITH TSH
FREE THYROXINE INDEX: 1.3 (ref 1.2–4.9)
T3 UPTAKE RATIO: 24 % (ref 24–39)
T4, Total: 5.6 ug/dL (ref 4.5–12.0)
TSH: 3.09 u[IU]/mL (ref 0.450–4.500)

## 2017-02-02 ENCOUNTER — Ambulatory Visit (INDEPENDENT_AMBULATORY_CARE_PROVIDER_SITE_OTHER): Payer: Medicare Other

## 2017-02-02 DIAGNOSIS — Z23 Encounter for immunization: Secondary | ICD-10-CM | POA: Diagnosis not present

## 2017-02-24 ENCOUNTER — Other Ambulatory Visit: Payer: Self-pay | Admitting: Family Medicine

## 2017-03-07 ENCOUNTER — Ambulatory Visit (INDEPENDENT_AMBULATORY_CARE_PROVIDER_SITE_OTHER): Payer: Medicare Other | Admitting: Pulmonary Disease

## 2017-03-07 ENCOUNTER — Encounter: Payer: Self-pay | Admitting: Pulmonary Disease

## 2017-03-07 VITALS — BP 122/72 | HR 89 | Ht 71.0 in | Wt 235.0 lb

## 2017-03-07 DIAGNOSIS — G4733 Obstructive sleep apnea (adult) (pediatric): Secondary | ICD-10-CM

## 2017-03-07 DIAGNOSIS — Z9989 Dependence on other enabling machines and devices: Secondary | ICD-10-CM | POA: Diagnosis not present

## 2017-03-07 DIAGNOSIS — J449 Chronic obstructive pulmonary disease, unspecified: Secondary | ICD-10-CM | POA: Diagnosis not present

## 2017-03-07 NOTE — Progress Notes (Signed)
Current Outpatient Medications on File Prior to Visit  Medication Sig  . alfuzosin (UROXATRAL) 10 MG 24 hr tablet Take 10 mg by mouth every morning.   [name] aspirin 81 MG tablet Take 81 mg by mouth 2 (two) times daily.   . Cholecalciferol (VITAMIN D-3) 5000 UNITS TABS Take 1 tablet by mouth daily.   . Coenzyme Q10 (CO Q 10 PO) Take 1 capsule by mouth daily.   . cyclobenzaprine (FLEXERIL) 10 MG tablet TAKE 1 TABLET (10 MG TOTAL) BY MOUTH 3 (THREE) TIMES DAILY AS NEEDED FOR MUSCLE SPASMS.  . fish oil-omega-3 fatty acids 1000 MG capsule Take 2 g by mouth daily.   . Glucosamine-Chondroit-Vit C-Mn (GLUCOSAMINE CHONDR 1500 COMPLX) CAPS Take by mouth every morning.  . Ipratropium-Albuterol (COMBIVENT) 20-100 MCG/ACT AERS respimat Inhale 1 puff into the lungs as needed for wheezing or shortness of breath.  . losartan (COZAAR) 50 MG tablet TAKE 1 TABLET (50 MG TOTAL) BY MOUTH DAILY.  . montelukast (SINGULAIR) 10 MG tablet TAKE 1 TABLET AT BEDTIME  . Multiple Vitamins-Minerals (CENTRUM SILVER ULTRA MENS) TABS Take 1 tablet by mouth every morning. Once a day  . polyethylene glycol (MIRALAX / GLYCOLAX) packet Take 17 g by mouth as needed.   . Probiotic Product (PHILLIPS COLON HEALTH PO) Take 1 capsule by mouth daily.   [name] pyridOXINE (VITAMIN B-6) 100 MG tablet Take 100 mg by mouth daily.   . rosuvastatin (CRESTOR) 10 MG tablet TAKE 1 TABLET (10 MG TOTAL) BY MOUTH DAILY.  . sildenafil (REVATIO) 20 MG tablet Take 2-5 tabs prior to sexual activity  . Turmeric 500 MG CAPS Take by mouth every morning.  . vitamin B-12 (CYANOCOBALAMIN) 500 MCG tablet Take 500 mcg by mouth daily.   No current facility-administered medications on file prior to visit.     Chief Complaint  Patient presents with  . Follow-up    Pt is doing well over all. Pt is wanting something more user friendly with a new mask  for the cpap machine.    Sleep tests PSG 06/08/04 >> AHI 29, SpO2 84% Auto-CPAP download 08/10/10 to 08/30/10>>Used on  21 of 21 nights with average 6hrs 46 min.  Average AHI 1.6 with optimal pressure 10 cm H2O. CPAP 10/09/12 to 11/07/12 >> Used on 29 of 30 nights with average 6 hrs 44 min. CPAP 10 cm H2O. ONO with RA 12/05/13 >> Test time 7 hrs 47 min. Mean SpO2 92.8%, low SpO2 89%.  Pulmonary tests PFT 09/09/11>>FEV1 3.06 (95%), FEV1% 69, FEF 25-75% 1.70 (59%), TLC 7.67 (109%), DLCO 108%, no BD  Cardiac tests Echo 02/11/14 >> mod LVH, EF 50 to 62%, grade 1 diastolic dysfx  Past medical history HLD, PAD, GERD s/p Nissen fundoplication, ED, BPH, Nephrolithiasis  Past surgical history, Family history, Social history, Allergies reviewed  Vital signs BP 122/72 (BP Location: Left Arm, Cuff Size: Normal)   Pulse 89   Ht 5\' 11"  (1.803 m)   Wt 235 lb (106.6 kg)   SpO2 96%   BMI 32.78 kg/m   History of Present Illness: Connor [name] is a 71 y.o. male former smoker with OSA and chronic cough with borderline COPD.  He uses CPAP nightly.  No issues with pressure setting.  His mask shifts when he roles on his side.    No issues with breathing.  Denies cough, wheeze, or sputum.  Reports possible asbestos exposure while in the [employer] years ago.  CXR from March 2018 did not show anything to  suggest asbestos related lung disease.   Physical Exam:  General - pleasant Eyes - pupils reactive, wears glasses ENT - no sinus tenderness, no oral exudate, no LAN, MP 2 Cardiac - regular, no murmur Chest - no wheeze, rales Abd - soft, non tender Ext - no edema Skin - no rashes Neuro - normal strength Psych - normal mood   Assessment/Plan:  Obstructive sleep apnea. - he is compliant with CPAP and reports benefit - continue CPAP 10 cm H2O - advised him to look on line for different mask options and call if he finds one he would like to change to - advised him to look into a CPAP pillow - he will check with his DME about when he is eligible for a new machine  COPD. - continue prn combivent   Patient  Instructions  Call if you find a CPAP mask you would like to change to  Follow up in 1 year   Chesley Mires, MD [place] 03/07/2017, 5:49 PM Pager:  [phone]

## 2017-03-07 NOTE — Patient Instructions (Signed)
Call if you find a CPAP mask you would like to change to  Follow up in 1 year

## 2017-03-09 DIAGNOSIS — M19011 Primary osteoarthritis, right shoulder: Secondary | ICD-10-CM | POA: Diagnosis not present

## 2017-03-09 DIAGNOSIS — M25511 Pain in right shoulder: Secondary | ICD-10-CM | POA: Diagnosis not present

## 2017-03-09 DIAGNOSIS — M778 Other enthesopathies, not elsewhere classified: Secondary | ICD-10-CM | POA: Diagnosis not present

## 2017-03-20 ENCOUNTER — Other Ambulatory Visit: Payer: Self-pay | Admitting: Hematology & Oncology

## 2017-03-31 ENCOUNTER — Ambulatory Visit (INDEPENDENT_AMBULATORY_CARE_PROVIDER_SITE_OTHER): Payer: Medicare Other | Admitting: *Deleted

## 2017-03-31 VITALS — BP 132/68 | HR 66 | Temp 97.5°F | Ht 71.0 in | Wt 230.0 lb

## 2017-03-31 DIAGNOSIS — Z0000 Encounter for general adult medical examination without abnormal findings: Secondary | ICD-10-CM | POA: Diagnosis not present

## 2017-03-31 NOTE — Progress Notes (Addendum)
Subjective:   Connor [name] is a 71 y.o. male who presents for an Initial Medicare Annual Wellness Visit. He is retired from [employer] and [employer]. He enjoys traveling, playing gold and going to "old-car shows". He currently does not have a exercise regimen, but states he eats a semi-healthy diet. He attends Church regularly does community work with [employer]. He lives at home with his wife and they have 3 children that all live within 30 minutes from them. They do not have pets and he is aware of all fall risk and hazards. He states that his health is better that it was a year ago.   Review of Systems       Objective:    Today's Vitals   03/31/17 1446  BP: 132/68  Pulse: 66  Temp: (!) 97.5 F (36.4 C)  TempSrc: Oral  Weight: 230 lb (104.3 kg)  Height: 5\' 11"  (1.803 m)   Body mass index is 32.08 kg/m.  Advanced Directives 03/31/2017 12/22/2016 11/10/2016 08/20/2016 07/09/2016 05/28/2016 12/26/2015  Does Patient Have a Medical Advance Directive? No No No No No No No  Would patient like information on creating a medical advance directive? Yes (MAU/Ambulatory/Procedural Areas - Information given) - - - - - No - patient declined information  Pre-existing out of facility DNR order (yellow form or pink MOST form) - - - - - - -    Current Medications (verified) Outpatient Encounter Medications as of 03/31/2017  Medication Sig  . alfuzosin (UROXATRAL) 10 MG 24 hr tablet Take 10 mg by mouth every morning.   [name] aspirin 81 MG tablet Take 81 mg by mouth 2 (two) times daily.   . Cholecalciferol (VITAMIN D-3) 5000 UNITS TABS Take 1 tablet by mouth daily.   . Coenzyme Q10 (CO Q 10 PO) Take 1 capsule by mouth daily.   . fish oil-omega-3 fatty acids 1000 MG capsule Take 2 g by mouth daily.   . Glucosamine-Chondroit-Vit C-Mn (GLUCOSAMINE CHONDR 1500 COMPLX) CAPS Take by mouth every morning.  [name] losartan (COZAAR) 50 MG tablet TAKE 1 TABLET (50 MG TOTAL) BY MOUTH DAILY.  . montelukast (SINGULAIR) 10 MG  tablet TAKE 1 TABLET AT BEDTIME  . Multiple Vitamins-Minerals (CENTRUM SILVER ULTRA MENS) TABS Take 1 tablet by mouth every morning. Once a day  . Probiotic Product (PHILLIPS COLON HEALTH PO) Take 1 capsule by mouth daily.   [name] pyridOXINE (VITAMIN B-6) 100 MG tablet Take 100 mg by mouth daily.   . rosuvastatin (CRESTOR) 10 MG tablet TAKE 1 TABLET (10 MG TOTAL) BY MOUTH DAILY.  . Turmeric 500 MG CAPS Take by mouth every morning.  . vitamin B-12 (CYANOCOBALAMIN) 500 MCG tablet Take 500 mcg by mouth daily.  . cyclobenzaprine (FLEXERIL) 10 MG tablet TAKE 1 TABLET (10 MG TOTAL) BY MOUTH 3 (THREE) TIMES DAILY AS NEEDED FOR MUSCLE SPASMS. (Patient not taking: Reported on 03/31/2017)  . Ipratropium-Albuterol (COMBIVENT) 20-100 MCG/ACT AERS respimat Inhale 1 puff into the lungs as needed for wheezing or shortness of breath. (Patient not taking: Reported on 03/31/2017)  . polyethylene glycol (MIRALAX / GLYCOLAX) packet Take 17 g by mouth as needed.   . sildenafil (REVATIO) 20 MG tablet Take 2-5 tabs prior to sexual activity (Patient not taking: Reported on 03/31/2017)   No facility-administered encounter medications on file as of 03/31/2017.     Allergies (verified) Dexlansoprazole   History: Past Medical History:  Diagnosis Date  . Anemia   . BPH (benign prostatic hyperplasia)   .  Cancer (HCC)    squamous cell on nose , inside nose   . Cardiomegaly   . Carotid artery stenosis   . COPD, mild ([place]) 05/26/2007   PFT 09/09/11>>FEV1 3.06 (95%), FEV1% 69, FEF 25-75% 1.70 (59%), TLC 7.67 (109%), DLCO 108%, no BD    . Cough 05/26/2007  . Erectile dysfunction   . GERD (gastroesophageal reflux disease)   . H/O vitamin D deficiency   . History of nuclear stress test    this revealed normal myocardial perfusion and function. Post stress EF was 57%. There was no region of scar or ischemia.  [name] Hx of echocardiogram 02/15/2012   This showed mild concentric LVH. EF > 55%. H edid have grade 1 diastolic  dysfunction with mitral valve E:A ratio of 0.81, his left atrium was mildly dilated by volume assessment at 33.3 mL/m2. He did have mild tricupid regurgitation with upper normal RV systolic pressure at 25KN. He had aortic valve sclerosis without stenosis.  . Hyperlipemia   . Iron deficiency anemia, unspecified 09/01/2012  . Iron malabsorption 05/28/2016  . Nephrolithiasis    stones   . Peyronie's disease   . Polycythemia vera(238.4) 04/19/2013  . Shortness of breath    with exertion   . Sleep apnea    Cpap-   . Thrombocytopenia ([place])    Past Surgical History:  Procedure Laterality Date  . APPENDECTOMY    . EYE SURGERY Bilateral    eye lid surgery  . HIATAL HERNIA REPAIR  11/24/2011   Procedure: LAPAROSCOPIC REPAIR OF HIATAL HERNIA;  Surgeon: Pedro Earls, MD;  Location: [facility];  Service: General;;  . KNEE SURGERY     right  . LAPAROSCOPIC NISSEN FUNDOPLICATION  3/97/6734   Procedure: LAPAROSCOPIC NISSEN FUNDOPLICATION;  Surgeon: Pedro Earls, MD;  Location: [facility];  Service: General;  Laterality: N/A;  . NOSE SURGERY  2013  . SHOULDER SURGERY     left   Family History  Problem Relation Age of Onset  . Heart disease Mother   . Diabetes Mother   . Heart disease Father   . Heart disease Sister   . Arthritis Sister   . Diabetes Brother   . Hypertension Brother   . Heart attack Brother        enlarged heart  . Liver cancer Paternal Grandfather   . Throat cancer Maternal Grandfather   . Hemachromatosis Daughter   . Arthritis Daughter   . GI problems Daughter   . Thyroid disease Daughter   . Hypertension Daughter   . Heart disease Paternal Grandmother   . Thyroid disease Daughter        pituitary tumor  . Migraines Daughter   . Colon cancer Neg Hx   . Esophageal cancer Neg Hx   . Rectal cancer Neg Hx   . Stomach cancer Neg Hx    Social History   Socioeconomic History  . Marital status: Married    Spouse name: None  . Number of children: 3  . Years of  education: None  . Highest education level: None  Social Needs  . Financial resource strain: None  . Food insecurity - worry: None  . Food insecurity - inability: None  . Transportation needs - medical: None  . Transportation needs - non-medical: None  Occupational History  . Occupation: retired    Fish farm manager: RETIRED    Comment: [employer]  Tobacco Use  . Smoking status: Former Smoker    Packs/day: 0.25  Years: 11.00    Pack years: 2.75    Types: Cigarettes    Start date: 05/17/1958    Last attempt to quit: 04/12/1966    Years since quitting: 51.0  . Smokeless tobacco: Never Used  . Tobacco comment: quit smoking in 1968  Substance and Sexual Activity  . Alcohol use: Yes    Alcohol/week: 0.0 oz    Comment: seldom  . Drug use: No  . Sexual activity: None  Other Topics Concern  . None  Social History Narrative  . None   Tobacco Counseling Counseling given: Not Answered Comment: quit smoking in 1968   Clinical Intake:                       Activities of Daily Living In your present state of health, do you have any difficulty performing the following activities: 03/31/2017  Hearing? N  Vision? Y  Comment Wears rx glasses   Difficulty concentrating or making decisions? N  Walking or climbing stairs? N  Dressing or bathing? N  Doing errands, shopping? N  Preparing Food and eating ? N  Using the Toilet? N  In the past six months, have you accidently leaked urine? N  Do you have problems with loss of bowel control? N  Managing your Medications? N  Managing your Finances? N  Housekeeping or managing your Housekeeping? N  Some recent data might be hidden     Immunizations and Health Maintenance Immunization History  Administered Date(s) Administered  . Influenza Whole 01/11/2011  . Influenza, High Dose Seasonal PF 02/25/2016, 02/02/2017  . Influenza,inj,Quad PF,6+ Mos 02/07/2013, 01/17/2014, 03/26/2015  . Influenza-Unspecified 03/26/2015  .  Pneumococcal Conjugate-13 09/07/2013  . Pneumococcal Polysaccharide-23 02/10/2009  . Tdap 08/11/2006  . Zoster 08/11/2006   Health Maintenance Due  Topic Date Due  . TETANUS/TDAP  08/10/2016  . COLON CANCER SCREENING ANNUAL FOBT  08/17/2016    Patient Care Team: [name], MD as PCP - General (Family Medicine) Troy Sine, MD as Consulting Physician (Cardiology) Roseanne Kaufman, MD as Consulting Physician (Orthopedic Surgery) Gatha Mayer, MD as Consulting Physician (Gastroenterology) Wylene Simmer, MD as Consulting Physician (Orthopedic Surgery) Melissa Montane, MD as Consulting Physician (Otolaryngology) Chesley Mires, MD as Consulting Physician (Pulmonary Disease) Susa Day, MD as Consulting Physician (Orthopedic Surgery) Sydnee Cabal, MD as Consulting Physician (Orthopedic Surgery) Volanda Napoleon, MD as Consulting Physician (Oncology)  Indicate any recent [place] you may have received from other than [facility] providers in the past year (date may be approximate).    Assessment:   This is a routine wellness examination for [place].  Hearing/Vision screen No exam data present  Dietary issues and exercise activities discussed:    Goals    . Weight (lb) < 215 lb (97.5 kg)      Depression Screen PHQ 2/9 Scores 03/31/2017 12/22/2016 11/16/2016 07/02/2016  PHQ - 2 Score 0 0 0 4  PHQ- 9 Score - - - 7    Fall Risk Fall Risk  03/31/2017 12/22/2016 11/16/2016 07/02/2016 12/31/2015  Falls in the past year? No No No No No    Is the patient's home free of loose throw rugs in walkways, pet beds, electrical cords, etc?   He is aware of hazards and fall risk.  Timed Get Up and Go performed:    Cognitive Function: MMSE - Mini Mental State Exam 03/31/2017  Orientation to time 5  Orientation to Place 5  Registration 3  Attention/ Calculation  5  Recall 3  Language- name 2 objects 2  Language- repeat 1  Language- follow 3 step command 3  Language- read & follow  direction 1  Write a sentence 1  Copy design 1  Total score 30        Screening Tests Health Maintenance  Topic Date Due  . TETANUS/TDAP  08/10/2016  . COLON CANCER SCREENING ANNUAL FOBT  08/17/2016  . Hepatitis C Screening  07/03/2023 (Originally 03-24-1946)  . INFLUENZA VACCINE  Completed  . PNA vac Low Risk Adult  Completed    Qualifies for Shingles Vaccine?  Cancer Screenings: Lung: Low Dose CT Chest recommended if Age 77-80 years, 30 pack-year currently smoking OR have quit w/in 15years. Patient does qualify. Colorectal:   Additional Screenings: Hepatitis B/HIV/Syphillis: Hepatitis C Screening:       Plan:    He is to keep follow up appointments with Dr Laurance Flatten and other specialist. He is to look over and complete the advanced directives and bring [place] a copy.   I have personally reviewed and noted the following in the patient's chart:   . Medical and social history . Use of alcohol, tobacco or illicit drugs  . Current medications and supplements . Functional ability and status . Nutritional status . Physical activity . Advanced directives . List of other physicians . Hospitalizations, surgeries, and ER visits in previous 12 months . Vitals . Screenings to include cognitive, depression, and falls . Referrals and appointments  In addition, I have reviewed and discussed with patient certain preventive protocols, quality metrics, and best practice recommendations. A written personalized care plan for preventive services as well as general preventive health recommendations were provided to patient.     Breana Litts, Cameron Proud, LPN   59/16/3846   I have reviewed and agree with the above AWV documentation.   Mary-Margaret Hassell Done, FNP

## 2017-03-31 NOTE — Progress Notes (Deleted)
Subjective:   Connor [name] is a 71 y.o. male who presents for an Initial Medicare Annual Wellness Visit.  Review of Systems       Objective:    Today's Vitals   03/31/17 1446  BP: 132/68  Pulse: 66  Temp: (!) 97.5 F (36.4 C)  TempSrc: Oral  Weight: 230 lb (104.3 kg)  Height: 5\' 11"  (1.803 m)   Body mass index is 32.08 kg/m.  Advanced Directives 03/31/2017 12/22/2016 11/10/2016 08/20/2016 07/09/2016 05/28/2016 12/26/2015  Does Patient Have a Medical Advance Directive? No No No No No No No  Would patient like information on creating a medical advance directive? Yes (MAU/Ambulatory/Procedural Areas - Information given) - - - - - No - patient declined information  Pre-existing out of facility DNR order (yellow form or pink MOST form) - - - - - - -    Current Medications (verified) Outpatient Encounter Medications as of 03/31/2017  Medication Sig  . alfuzosin (UROXATRAL) 10 MG 24 hr tablet Take 10 mg by mouth every morning.   [name] aspirin 81 MG tablet Take 81 mg by mouth 2 (two) times daily.   . Cholecalciferol (VITAMIN D-3) 5000 UNITS TABS Take 1 tablet by mouth daily.   . Coenzyme Q10 (CO Q 10 PO) Take 1 capsule by mouth daily.   . fish oil-omega-3 fatty acids 1000 MG capsule Take 2 g by mouth daily.   . Glucosamine-Chondroit-Vit C-Mn (GLUCOSAMINE CHONDR 1500 COMPLX) CAPS Take by mouth every morning.  [name] losartan (COZAAR) 50 MG tablet TAKE 1 TABLET (50 MG TOTAL) BY MOUTH DAILY.  . montelukast (SINGULAIR) 10 MG tablet TAKE 1 TABLET AT BEDTIME  . Multiple Vitamins-Minerals (CENTRUM SILVER ULTRA MENS) TABS Take 1 tablet by mouth every morning. Once a day  . Probiotic Product (PHILLIPS COLON HEALTH PO) Take 1 capsule by mouth daily.   [name] pyridOXINE (VITAMIN B-6) 100 MG tablet Take 100 mg by mouth daily.   . rosuvastatin (CRESTOR) 10 MG tablet TAKE 1 TABLET (10 MG TOTAL) BY MOUTH DAILY.  . Turmeric 500 MG CAPS Take by mouth every morning.  . vitamin B-12 (CYANOCOBALAMIN) 500 MCG  tablet Take 500 mcg by mouth daily.  . cyclobenzaprine (FLEXERIL) 10 MG tablet TAKE 1 TABLET (10 MG TOTAL) BY MOUTH 3 (THREE) TIMES DAILY AS NEEDED FOR MUSCLE SPASMS. (Patient not taking: Reported on 03/31/2017)  . Ipratropium-Albuterol (COMBIVENT) 20-100 MCG/ACT AERS respimat Inhale 1 puff into the lungs as needed for wheezing or shortness of breath. (Patient not taking: Reported on 03/31/2017)  . polyethylene glycol (MIRALAX / GLYCOLAX) packet Take 17 g by mouth as needed.   . sildenafil (REVATIO) 20 MG tablet Take 2-5 tabs prior to sexual activity (Patient not taking: Reported on 03/31/2017)   No facility-administered encounter medications on file as of 03/31/2017.     Allergies (verified) Dexlansoprazole   History: Past Medical History:  Diagnosis Date  . Anemia   . BPH (benign prostatic hyperplasia)   . Cancer (HCC)    squamous cell on nose , inside nose   . Cardiomegaly   . Carotid artery stenosis   . COPD, mild ([place]) 05/26/2007   PFT 09/09/11>>FEV1 3.06 (95%), FEV1% 69, FEF 25-75% 1.70 (59%), TLC 7.67 (109%), DLCO 108%, no BD    . Cough 05/26/2007  . Erectile dysfunction   . GERD (gastroesophageal reflux disease)   . H/O vitamin D deficiency   . History of nuclear stress test    this revealed normal myocardial perfusion and function.  Post stress EF was 57%. There was no region of scar or ischemia.  [name] Hx of echocardiogram 02/15/2012   This showed mild concentric LVH. EF > 55%. H edid have grade 1 diastolic dysfunction with mitral valve E:A ratio of 0.81, his left atrium was mildly dilated by volume assessment at 33.3 mL/m2. He did have mild tricupid regurgitation with upper normal RV systolic pressure at 38SN. He had aortic valve sclerosis without stenosis.  . Hyperlipemia   . Iron deficiency anemia, unspecified 09/01/2012  . Iron malabsorption 05/28/2016  . Nephrolithiasis    stones   . Peyronie's disease   . Polycythemia vera(238.4) 04/19/2013  . Shortness of breath    with  exertion   . Sleep apnea    Cpap-   . Thrombocytopenia ([place])    Past Surgical History:  Procedure Laterality Date  . APPENDECTOMY    . EYE SURGERY Bilateral    eye lid surgery  . HIATAL HERNIA REPAIR  11/24/2011   Procedure: LAPAROSCOPIC REPAIR OF HIATAL HERNIA;  Surgeon: Pedro Earls, MD;  Location: [facility];  Service: General;;  . KNEE SURGERY     right  . LAPAROSCOPIC NISSEN FUNDOPLICATION  0/53/9767   Procedure: LAPAROSCOPIC NISSEN FUNDOPLICATION;  Surgeon: Pedro Earls, MD;  Location: [facility];  Service: General;  Laterality: N/A;  . NOSE SURGERY  2013  . SHOULDER SURGERY     left   Family History  Problem Relation Age of Onset  . Heart disease Mother   . Diabetes Mother   . Heart disease Father   . Heart disease Sister   . Arthritis Sister   . Diabetes Brother   . Hypertension Brother   . Heart attack Brother        enlarged heart  . Liver cancer Paternal Grandfather   . Throat cancer Maternal Grandfather   . Hemachromatosis Daughter   . Arthritis Daughter   . GI problems Daughter   . Thyroid disease Daughter   . Hypertension Daughter   . Heart disease Paternal Grandmother   . Thyroid disease Daughter        pituitary tumor  . Migraines Daughter   . Colon cancer Neg Hx   . Esophageal cancer Neg Hx   . Rectal cancer Neg Hx   . Stomach cancer Neg Hx    Social History   Socioeconomic History  . Marital status: Married    Spouse name: None  . Number of children: 3  . Years of education: None  . Highest education level: None  Social Needs  . Financial resource strain: None  . Food insecurity - worry: None  . Food insecurity - inability: None  . Transportation needs - medical: None  . Transportation needs - non-medical: None  Occupational History  . Occupation: retired    Fish farm manager: RETIRED    Comment: [employer]  Tobacco Use  . Smoking status: Former Smoker    Packs/day: 0.25    Years: 11.00    Pack years: 2.75    Types: Cigarettes     Start date: 05/17/1958    Last attempt to quit: 04/12/1966    Years since quitting: 51.0  . Smokeless tobacco: Never Used  . Tobacco comment: quit smoking in 1968  Substance and Sexual Activity  . Alcohol use: Yes    Alcohol/week: 0.0 oz    Comment: seldom  . Drug use: No  . Sexual activity: None  Other Topics Concern  . None  Social History Narrative  .  None   Tobacco Counseling Counseling given: Not Answered Comment: quit smoking in 1968   Clinical Intake:                       Activities of Daily Living In your present state of health, do you have any difficulty performing the following activities: 03/31/2017  Hearing? N  Vision? Y  Comment Wears rx glasses   Difficulty concentrating or making decisions? N  Walking or climbing stairs? N  Dressing or bathing? N  Doing errands, shopping? N  Preparing Food and eating ? N  Using the Toilet? N  In the past six months, have you accidently leaked urine? N  Do you have problems with loss of bowel control? N  Managing your Medications? N  Managing your Finances? N  Housekeeping or managing your Housekeeping? N  Some recent data might be hidden     Immunizations and Health Maintenance Immunization History  Administered Date(s) Administered  . Influenza Whole 01/11/2011  . Influenza, High Dose Seasonal PF 02/25/2016, 02/02/2017  . Influenza,inj,Quad PF,6+ Mos 02/07/2013, 01/17/2014, 03/26/2015  . Influenza-Unspecified 03/26/2015  . Pneumococcal Conjugate-13 09/07/2013  . Pneumococcal Polysaccharide-23 02/10/2009  . Tdap 08/11/2006  . Zoster 08/11/2006   Health Maintenance Due  Topic Date Due  . TETANUS/TDAP  08/10/2016  . COLON CANCER SCREENING ANNUAL FOBT  08/17/2016    Patient Care Team: [name], MD as PCP - General (Family Medicine) Troy Sine, MD as Consulting Physician (Cardiology) Roseanne Kaufman, MD as Consulting Physician (Orthopedic Surgery) Gatha Mayer, MD as Consulting  Physician (Gastroenterology) Wylene Simmer, MD as Consulting Physician (Orthopedic Surgery) Melissa Montane, MD as Consulting Physician (Otolaryngology) Chesley Mires, MD as Consulting Physician (Pulmonary Disease) Susa Day, MD as Consulting Physician (Orthopedic Surgery) Sydnee Cabal, MD as Consulting Physician (Orthopedic Surgery) Volanda Napoleon, MD as Consulting Physician (Oncology)  Indicate any recent [place] you may have received from other than [facility] providers in the past year (date may be approximate).    Assessment:   This is a routine wellness examination for [place].  Hearing/Vision screen No exam data present  Dietary issues and exercise activities discussed:    Goals    . Weight (lb) < 215 lb (97.5 kg)      Depression Screen PHQ 2/9 Scores 03/31/2017 12/22/2016 11/16/2016 07/02/2016  PHQ - 2 Score 0 0 0 4  PHQ- 9 Score - - - 7    Fall Risk Fall Risk  03/31/2017 12/22/2016 11/16/2016 07/02/2016 12/31/2015  Falls in the past year? No No No No No    Is the patient's home free of loose throw rugs in walkways, pet beds, electrical cords, etc?   {Blank single:19197::"yes","no"}      Grab bars in the bathroom? {Blank single:19197::"yes","no"}      Handrails on the stairs?   {Blank single:19197::"yes","no"}      Adequate lighting?   {Blank single:19197::"yes","no"}  Timed Get Up and Go performed: ***  Cognitive Function: MMSE - Mini Mental State Exam 03/31/2017  Orientation to time 5  Orientation to Place 5  Registration 3  Attention/ Calculation 5  Recall 3  Language- name 2 objects 2  Language- repeat 1  Language- follow 3 step command 3  Language- read & follow direction 1  Write a sentence 1  Copy design 1  Total score 30        Screening Tests Health Maintenance  Topic Date Due  . TETANUS/TDAP  08/10/2016  .  COLON CANCER SCREENING ANNUAL FOBT  08/17/2016  . Hepatitis C Screening  07/03/2023 (Originally Apr 15, 1945)  . INFLUENZA VACCINE   Completed  . PNA vac Low Risk Adult  Completed    Qualifies for Shingles Vaccine? ***  Cancer Screenings: Lung: Low Dose CT Chest recommended if Age 39-80 years, 30 pack-year currently smoking OR have quit w/in 15years. Patient {DOES NOT does:27190::"does not"} qualify. Colorectal: ***  Additional Screenings: *** Hepatitis B/HIV/Syphillis: Hepatitis C Screening:       Plan:   ***  I have personally reviewed and noted the following in the patient's chart:   . Medical and social history . Use of alcohol, tobacco or illicit drugs  . Current medications and supplements . Functional ability and status . Nutritional status . Physical activity . Advanced directives . List of other physicians . Hospitalizations, surgeries, and ER visits in previous 12 months . Vitals . Screenings to include cognitive, depression, and falls . Referrals and appointments  In addition, I have reviewed and discussed with patient certain preventive protocols, quality metrics, and best practice recommendations. A written personalized care plan for preventive services as well as general preventive health recommendations were provided to patient.     [place] Dec, [place]   03/00/9233

## 2017-03-31 NOTE — Patient Instructions (Signed)
  Connor [name] , Thank you for taking time to come for your Medicare Wellness Visit. I appreciate your ongoing commitment to your health goals. Please review the following plan we discussed and let me know if I can assist you in the future.   These are the goals we discussed: Goals    . Weight (lb) < 215 lb (97.5 kg)       This is a list of the screening recommended for you and due dates:  Health Maintenance  Topic Date Due  . Tetanus Vaccine  08/10/2016  . Stool Blood Test  08/17/2016  .  Hepatitis C: One time screening is recommended by Center for Disease Control  (CDC) for  adults born from [birth year] through 1967.   07/03/2023*  . Flu Shot  Completed  . Pneumonia vaccines  Completed  *Topic was postponed. The date shown is not the original due date.

## 2017-04-13 ENCOUNTER — Encounter: Payer: Self-pay | Admitting: Hematology & Oncology

## 2017-04-13 ENCOUNTER — Other Ambulatory Visit (HOSPITAL_BASED_OUTPATIENT_CLINIC_OR_DEPARTMENT_OTHER): Payer: Medicare Other

## 2017-04-13 ENCOUNTER — Ambulatory Visit (HOSPITAL_BASED_OUTPATIENT_CLINIC_OR_DEPARTMENT_OTHER): Payer: Medicare Other

## 2017-04-13 ENCOUNTER — Other Ambulatory Visit: Payer: Self-pay | Admitting: Family

## 2017-04-13 ENCOUNTER — Ambulatory Visit (HOSPITAL_BASED_OUTPATIENT_CLINIC_OR_DEPARTMENT_OTHER): Payer: Medicare Other | Admitting: Hematology & Oncology

## 2017-04-13 ENCOUNTER — Other Ambulatory Visit: Payer: Self-pay

## 2017-04-13 VITALS — BP 152/67 | HR 94 | Temp 97.7°F | Resp 17 | Wt 240.8 lb

## 2017-04-13 DIAGNOSIS — Z832 Family history of diseases of the blood and blood-forming organs and certain disorders involving the immune mechanism: Secondary | ICD-10-CM | POA: Diagnosis not present

## 2017-04-13 DIAGNOSIS — D45 Polycythemia vera: Secondary | ICD-10-CM

## 2017-04-13 DIAGNOSIS — D696 Thrombocytopenia, unspecified: Secondary | ICD-10-CM

## 2017-04-13 DIAGNOSIS — D501 Sideropenic dysphagia: Secondary | ICD-10-CM

## 2017-04-13 LAB — CBC WITH DIFFERENTIAL (CANCER CENTER ONLY)
BASO#: 0 10*3/uL (ref 0.0–0.2)
BASO%: 0.6 % (ref 0.0–2.0)
EOS ABS: 0.1 10*3/uL (ref 0.0–0.5)
EOS%: 2.3 % (ref 0.0–7.0)
HEMATOCRIT: 46.1 % (ref 38.7–49.9)
HEMOGLOBIN: 15.1 g/dL (ref 13.0–17.1)
LYMPH#: 0.8 10*3/uL — AB (ref 0.9–3.3)
LYMPH%: 16.3 % (ref 14.0–48.0)
MCH: 27.6 pg — ABNORMAL LOW (ref 28.0–33.4)
MCHC: 32.8 g/dL (ref 32.0–35.9)
MCV: 84 fL (ref 82–98)
MONO#: 0.3 10*3/uL (ref 0.1–0.9)
MONO%: 6.6 % (ref 0.0–13.0)
NEUT%: 74.2 % (ref 40.0–80.0)
NEUTROS ABS: 3.8 10*3/uL (ref 1.5–6.5)
Platelets: 113 10*3/uL — ABNORMAL LOW (ref 145–400)
RBC: 5.47 10*6/uL (ref 4.20–5.70)
RDW: 15 % (ref 11.1–15.7)
WBC: 5.2 10*3/uL (ref 4.0–10.0)

## 2017-04-13 LAB — IRON AND TIBC
%SAT: 24 % (ref 20–55)
Iron: 83 ug/dL (ref 42–163)
TIBC: 348 ug/dL (ref 202–409)
UIBC: 265 ug/dL (ref 117–376)

## 2017-04-13 LAB — CMP (CANCER CENTER ONLY)
ALBUMIN: 3.5 g/dL (ref 3.3–5.5)
ALK PHOS: 64 U/L (ref 26–84)
ALT(SGPT): 26 U/L (ref 10–47)
AST: 22 U/L (ref 11–38)
BUN, Bld: 17 mg/dL (ref 7–22)
CALCIUM: 9.4 mg/dL (ref 8.0–10.3)
CO2: 26 meq/L (ref 18–33)
Chloride: 104 mEq/L (ref 98–108)
Creat: 1 mg/dl (ref 0.6–1.2)
GLUCOSE: 135 mg/dL — AB (ref 73–118)
POTASSIUM: 4 meq/L (ref 3.3–4.7)
Sodium: 139 mEq/L (ref 128–145)
Total Bilirubin: 0.7 mg/dl (ref 0.20–1.60)
Total Protein: 6.3 g/dL — ABNORMAL LOW (ref 6.4–8.1)

## 2017-04-13 LAB — LACTATE DEHYDROGENASE: LDH: 123 U/L — ABNORMAL LOW (ref 125–245)

## 2017-04-13 LAB — FERRITIN: Ferritin: 10 ng/ml — ABNORMAL LOW (ref 22–316)

## 2017-04-13 NOTE — Progress Notes (Signed)
Hematology and Oncology Follow Up Visit  Connor [name] [id number] 1946-02-11 72 y.o. 04/13/2017   Principle Diagnosis:   Polycythemia vera-Triple negative  Chronic immune thrombocytopenia vs medication  Transient iron deficiency secondary to phlebotomies  Current Therapy:    Phlebotomy to maintain hematocrit below 45%  Aspirin 81 mg by mouth daily  IV iron as indicated - last dose given on 06/02/2016     Interim History:  Connor [name] is back for followup.  He is doing pretty well.  He had no problems over the holidays.  He did hurt his back last week.  His home was being remodeled.  He showed me pictures.  It is quite impressive.  He has had no problems with bleeding.  He has had no problems with nausea or vomiting.  He has had no problems with fever.  He has had no issues with infections.  His last iron studies back in September showed a ferritin of 8 with an iron saturation of 19%.  He has had no rashes.  He has had no problems with any change in bowels or bladder.  Overall, his performance status is ECOG 0  Medications:  Current Outpatient Medications:  .  alfuzosin (UROXATRAL) 10 MG 24 hr tablet, Take 10 mg by mouth every morning. , Disp: , Rfl:  .  aspirin 81 MG tablet, Take 81 mg by mouth 2 (two) times daily. , Disp: , Rfl:  .  Cholecalciferol (VITAMIN D-3) 5000 UNITS TABS, Take 1 tablet by mouth daily. , Disp: , Rfl:  .  Coenzyme Q10 (CO Q 10 PO), Take 1 capsule by mouth daily. , Disp: , Rfl:  .  cyclobenzaprine (FLEXERIL) 10 MG tablet, TAKE 1 TABLET (10 MG TOTAL) BY MOUTH 3 (THREE) TIMES DAILY AS NEEDED FOR MUSCLE SPASMS. (Patient not taking: Reported on 03/31/2017), Disp: 30 tablet, Rfl: 1 .  fish oil-omega-3 fatty acids 1000 MG capsule, Take 2 g by mouth daily. , Disp: , Rfl:  .  Glucosamine-Chondroit-Vit C-Mn (GLUCOSAMINE CHONDR 1500 COMPLX) CAPS, Take by mouth every morning., Disp: , Rfl:  .  Ipratropium-Albuterol (COMBIVENT) 20-100 MCG/ACT AERS respimat, Inhale  1 puff into the lungs as needed for wheezing or shortness of breath. (Patient not taking: Reported on 03/31/2017), Disp: 1 Inhaler, Rfl: 11 .  losartan (COZAAR) 50 MG tablet, TAKE 1 TABLET (50 MG TOTAL) BY MOUTH DAILY., Disp: 90 tablet, Rfl: 2 .  montelukast (SINGULAIR) 10 MG tablet, TAKE 1 TABLET AT BEDTIME, Disp: 30 tablet, Rfl: 4 .  Multiple Vitamins-Minerals (CENTRUM SILVER ULTRA MENS) TABS, Take 1 tablet by mouth every morning. Once a day, Disp: , Rfl:  .  polyethylene glycol (MIRALAX / GLYCOLAX) packet, Take 17 g by mouth as needed. , Disp: , Rfl:  .  Probiotic Product (PHILLIPS COLON HEALTH PO), Take 1 capsule by mouth daily. , Disp: , Rfl:  .  pyridOXINE (VITAMIN B-6) 100 MG tablet, Take 100 mg by mouth daily. , Disp: , Rfl:  .  rosuvastatin (CRESTOR) 10 MG tablet, TAKE 1 TABLET (10 MG TOTAL) BY MOUTH DAILY., Disp: 90 tablet, Rfl: 0 .  sildenafil (REVATIO) 20 MG tablet, Take 2-5 tabs prior to sexual activity (Patient not taking: Reported on 03/31/2017), Disp: 50 tablet, Rfl: 1 .  Turmeric 500 MG CAPS, Take by mouth every morning., Disp: , Rfl:  .  vitamin B-12 (CYANOCOBALAMIN) 500 MCG tablet, Take 500 mcg by mouth daily., Disp: , Rfl:   Allergies:  Allergies  Allergen Reactions  . Dexlansoprazole  Nausea And Vomiting    Past Medical History, Surgical history, Social history, and Family History were reviewed and updated.  Review of Systems: As stated in the interim history  Physical Exam:  weight is 240 lb 12.8 oz (109.2 kg). His oral temperature is 97.7 F (36.5 C). His blood pressure is 152/67 (abnormal) and his pulse is 94. His respiration is 17 and oxygen saturation is 98%.   Physical Exam  Constitutional: He is oriented to person, place, and time.  HENT:  Head: Normocephalic and atraumatic.  Mouth/Throat: Oropharynx is clear and moist.  Eyes: EOM are normal. Pupils are equal, round, and reactive to light.  Neck: Normal range of motion.  Cardiovascular: Normal rate,  regular rhythm and normal heart sounds.  Pulmonary/Chest: Effort normal and breath sounds normal.  Abdominal: Soft. Bowel sounds are normal.  Musculoskeletal: Normal range of motion. He exhibits no edema, tenderness or deformity.  Lymphadenopathy:    He has no cervical adenopathy.  Neurological: He is alert and oriented to person, place, and time.  Skin: Skin is warm and dry. No rash noted. No erythema.  Psychiatric: He has a normal mood and affect. His behavior is normal. Judgment and thought content normal.  Vitals reviewed.   Lab Results  Component Value Date   WBC 5.2 04/13/2017   HGB 15.1 04/13/2017   HCT 46.1 04/13/2017   MCV 84 04/13/2017   PLT 113 (L) 04/13/2017     Chemistry      Component Value Date/Time   NA 139 04/13/2017 1150   NA 141 07/09/2016 1038   K 4.0 04/13/2017 1150   K 4.0 07/09/2016 1038   CL 104 04/13/2017 1150   CO2 26 04/13/2017 1150   CO2 22 07/09/2016 1038   BUN 17 04/13/2017 1150   BUN 19.0 07/09/2016 1038   CREATININE 1.0 04/13/2017 1150   CREATININE 0.9 07/09/2016 1038      Component Value Date/Time   CALCIUM 9.4 04/13/2017 1150   CALCIUM 9.5 07/09/2016 1038   ALKPHOS 64 04/13/2017 1150   ALKPHOS 85 07/09/2016 1038   AST 22 04/13/2017 1150   AST 22 07/09/2016 1038   ALT 26 04/13/2017 1150   ALT 22 07/09/2016 1038   BILITOT 0.70 04/13/2017 1150   BILITOT 0.62 07/09/2016 1038         Impression and Plan: Connor [name] is a 72 year old gentleman with polycythemia.  We will go ahead and phlebotomize him today. I think this will be necessary.  I will plan to see him back in 6 weeks.  For right now, I do not see that we did make any other changes.  Of note, his daughter came in with him.  She has a heterozygous inheritance for hemochromatosis.  She was told that she does not have hemochromatosis because of this.  I told her that she does have hemochromatosis that she has to make sure that her iron studies are done routinely.   Volanda Napoleon, MD 1/2/20191:05 PM

## 2017-04-13 NOTE — Patient Instructions (Signed)

## 2017-04-13 NOTE — Progress Notes (Signed)
Connor [name] presents today for phlebotomy per MD orders. Phlebotomy procedure started at 1322 and ended at 1340 and  500 grams removed. Patient observed for 30 minutes after procedure without any incident. Patient tolerated procedure well. Diet and nutrition offered. IV needle removed intact.

## 2017-04-18 DIAGNOSIS — M545 Low back pain: Secondary | ICD-10-CM | POA: Diagnosis not present

## 2017-05-06 ENCOUNTER — Ambulatory Visit: Payer: Medicare Other | Admitting: Family Medicine

## 2017-05-19 ENCOUNTER — Other Ambulatory Visit: Payer: Self-pay | Admitting: Cardiovascular Disease

## 2017-05-19 NOTE — Telephone Encounter (Signed)
REFILL 

## 2017-05-25 ENCOUNTER — Inpatient Hospital Stay: Payer: Medicare Other

## 2017-05-25 ENCOUNTER — Encounter: Payer: Self-pay | Admitting: Family

## 2017-05-25 ENCOUNTER — Other Ambulatory Visit: Payer: Self-pay

## 2017-05-25 ENCOUNTER — Inpatient Hospital Stay: Payer: Medicare Other | Attending: Hematology & Oncology

## 2017-05-25 ENCOUNTER — Inpatient Hospital Stay (HOSPITAL_BASED_OUTPATIENT_CLINIC_OR_DEPARTMENT_OTHER): Payer: Medicare Other | Admitting: Family

## 2017-05-25 VITALS — BP 147/66 | HR 80 | Temp 98.3°F | Resp 20 | Wt 238.4 lb

## 2017-05-25 DIAGNOSIS — R5383 Other fatigue: Secondary | ICD-10-CM | POA: Diagnosis not present

## 2017-05-25 DIAGNOSIS — M2550 Pain in unspecified joint: Secondary | ICD-10-CM

## 2017-05-25 DIAGNOSIS — D500 Iron deficiency anemia secondary to blood loss (chronic): Secondary | ICD-10-CM | POA: Diagnosis not present

## 2017-05-25 DIAGNOSIS — Z7982 Long term (current) use of aspirin: Secondary | ICD-10-CM | POA: Insufficient documentation

## 2017-05-25 DIAGNOSIS — D45 Polycythemia vera: Secondary | ICD-10-CM | POA: Diagnosis not present

## 2017-05-25 DIAGNOSIS — K909 Intestinal malabsorption, unspecified: Secondary | ICD-10-CM

## 2017-05-25 LAB — CBC WITH DIFFERENTIAL (CANCER CENTER ONLY)
BASOS ABS: 0 10*3/uL (ref 0.0–0.1)
Basophils Relative: 1 %
EOS PCT: 3 %
Eosinophils Absolute: 0.2 10*3/uL (ref 0.0–0.5)
HCT: 45.4 % (ref 38.7–49.9)
HEMOGLOBIN: 14.7 g/dL (ref 13.0–17.1)
LYMPHS ABS: 0.9 10*3/uL (ref 0.9–3.3)
LYMPHS PCT: 17 %
MCH: 27.7 pg — AB (ref 28.0–33.4)
MCHC: 32.4 g/dL (ref 32.0–35.9)
MCV: 85.5 fL (ref 82.0–98.0)
Monocytes Absolute: 0.4 10*3/uL (ref 0.1–0.9)
Monocytes Relative: 7 %
NEUTROS ABS: 3.7 10*3/uL (ref 1.5–6.5)
NEUTROS PCT: 72 %
PLATELETS: 141 10*3/uL — AB (ref 145–400)
RBC: 5.31 MIL/uL (ref 4.20–5.70)
RDW: 14.6 % (ref 11.1–15.7)
WBC: 5.2 10*3/uL (ref 4.0–10.0)

## 2017-05-25 LAB — CMP (CANCER CENTER ONLY)
ALK PHOS: 70 U/L (ref 40–150)
ALT: 18 U/L (ref 0–55)
AST: 24 U/L (ref 5–34)
Albumin: 3.8 g/dL (ref 3.5–5.0)
Anion gap: 9 (ref 3–11)
BUN: 17 mg/dL (ref 7–26)
CALCIUM: 9.1 mg/dL (ref 8.4–10.4)
CHLORIDE: 109 mmol/L (ref 98–109)
CO2: 24 mmol/L (ref 22–29)
Creatinine: 1.18 mg/dL (ref 0.70–1.30)
GFR, EST NON AFRICAN AMERICAN: 59 mL/min — AB (ref >60)
GFR, Est AFR Am: >60 mL/min (ref >60)
Glucose, Bld: 105 mg/dL (ref 70–140)
Potassium: 4.3 mmol/L (ref 3.5–5.1)
SODIUM: 142 mmol/L (ref 136–145)
Total Bilirubin: 0.6 mg/dL (ref 0.2–1.2)
Total Protein: 6.4 g/dL (ref 6.4–8.3)

## 2017-05-25 LAB — IRON AND TIBC
Iron: 60 ug/dL (ref 42–163)
Saturation Ratios: 16 % — ABNORMAL LOW (ref 42–163)
TIBC: 366 ug/dL (ref 202–409)
UIBC: 306 ug/dL

## 2017-05-25 LAB — FERRITIN: Ferritin: 8 ng/mL — ABNORMAL LOW (ref 22–316)

## 2017-05-25 NOTE — Progress Notes (Signed)
Hematology and Oncology Follow Up Visit  Connor [name] [id number] [birth year] 72 y.o. 05/25/2017   Principle Diagnosis:  Polycythemia vera-Triple negative Chronic immune thrombocytopenia vs medication Transient iron deficiency secondary to phlebotomies  Current Therapy:   Phlebotomy to maintain hematocrit below 45% Aspirin 81 mg by mouth daily IV iron as indicated - last dose given on 06/02/2016   Interim History:  Connor [name] is here today with his wife for follow-up. He is doing well but is still having mild fatigue and generalized body aches.  He is taking his baby aspirin daily. Hct is 45.4% so he will have a phlebotomy today.  He has SOB with over exertion such as when he is chopping wood.  No fever, chills, n/v, cough, rash, dizziness, chest pain, palpitations, abdominal pain or changes in bowel or bladder habits.  He has occasional puffiness in his feet and ankles that improves when elevating his feet. He wears compression stockings every day.  He has chronic back problems and will have numbness and tingling in the right thigh off and on. This comes and goes quickly. No falls or syncopal episodes.  No bleeding, bruising or petechiae. No lymphadenopathy noted on exam.  He has maintained a good appetite and is staying well hydrated. His weight is stable.   ECOG Performance Status: 1 - Symptomatic but completely ambulatory  Medications:  Allergies as of 05/25/2017      Reactions   Dexlansoprazole Nausea And Vomiting      Medication List        Accurate as of 05/25/17 12:01 PM. Always use your most recent med list.          alfuzosin 10 MG 24 hr tablet Commonly known as:  UROXATRAL Take 10 mg by mouth every morning.   aspirin 81 MG tablet Take 81 mg by mouth 2 (two) times daily.   CENTRUM SILVER ULTRA MENS Tabs Take 1 tablet by mouth every morning. Once a day   CO Q 10 PO Take 1 capsule by mouth daily.   cyclobenzaprine 10 MG tablet Commonly known as:   FLEXERIL TAKE 1 TABLET (10 MG TOTAL) BY MOUTH 3 (THREE) TIMES DAILY AS NEEDED FOR MUSCLE SPASMS.   fish oil-omega-3 fatty acids 1000 MG capsule Take 2 g by mouth daily.   GLUCOSAMINE CHONDR 1500 COMPLX Caps Take by mouth every morning.   Ipratropium-Albuterol 20-100 MCG/ACT Aers respimat Commonly known as:  COMBIVENT Inhale 1 puff into the lungs as needed for wheezing or shortness of breath.   losartan 50 MG tablet Commonly known as:  COZAAR Take 1 tablet (50 mg total) by mouth daily. KEEP OV.   montelukast 10 MG tablet Commonly known as:  SINGULAIR TAKE 1 TABLET AT BEDTIME   PHILLIPS COLON HEALTH PO Take 1 capsule by mouth daily.   polyethylene glycol packet Commonly known as:  MIRALAX / GLYCOLAX Take 17 g by mouth as needed.   pyridOXINE 100 MG tablet Commonly known as:  VITAMIN B-6 Take 100 mg by mouth daily.   rosuvastatin 10 MG tablet Commonly known as:  CRESTOR TAKE 1 TABLET (10 MG TOTAL) BY MOUTH DAILY.   sildenafil 20 MG tablet Commonly known as:  REVATIO Take 2-5 tabs prior to sexual activity   Turmeric 500 MG Caps Take by mouth every morning.   vitamin B-12 500 MCG tablet Commonly known as:  CYANOCOBALAMIN Take 500 mcg by mouth daily.   Vitamin D-3 5000 units Tabs Take 1 tablet by mouth daily.  Allergies:  Allergies  Allergen Reactions  . Dexlansoprazole Nausea And Vomiting    Past Medical History, Surgical history, Social history, and Family History were reviewed and updated.  Review of Systems: All other 10 point review of systems is negative.   Physical Exam:  weight is 238 lb 6.4 oz (108.1 kg). His oral temperature is 98.3 F (36.8 C). His blood pressure is 147/66 (abnormal) and his pulse is 80. His respiration is 20 and oxygen saturation is 97%.   Wt Readings from Last 3 Encounters:  05/25/17 238 lb 6.4 oz (108.1 kg)  04/13/17 240 lb 12.8 oz (109.2 kg)  03/31/17 230 lb (104.3 kg)    Ocular: Sclerae unicteric, pupils equal,  round and reactive to light Ear-nose-throat: Oropharynx clear, dentition fair Lymphatic: No cervical, supraclavicular or axillary adenopathy Lungs no rales or rhonchi, good excursion bilaterally Heart regular rate and rhythm, no murmur appreciated Abd soft, nontender, positive bowel sounds, no liver or spleen tip palpated on exam, no fluid wave  MSK no focal spinal tenderness, no joint edema Neuro: non-focal, well-oriented, appropriate affect Breasts: Deferred   Lab Results  Component Value Date   WBC 5.2 05/25/2017   HGB 15.1 04/13/2017   HCT 45.4 05/25/2017   MCV 85.5 05/25/2017   PLT 141 (L) 05/25/2017   Lab Results  Component Value Date   FERRITIN 10 (L) 04/13/2017   IRON 83 04/13/2017   TIBC 348 04/13/2017   UIBC 265 04/13/2017   IRONPCTSAT 24 04/13/2017   Lab Results  Component Value Date   RETICCTPCT 0.8 09/06/2013   RBC 5.31 05/25/2017   RETICCTABS 40.2 09/06/2013   No results found for: KPAFRELGTCHN, LAMBDASER, KAPLAMBRATIO No results found for: IGGSERUM, IGA, IGMSERUM No results found for: Odetta Pink, SPEI   Chemistry      Component Value Date/Time   NA 139 04/13/2017 1150   NA 141 07/09/2016 1038   K 4.0 04/13/2017 1150   K 4.0 07/09/2016 1038   CL 104 04/13/2017 1150   CO2 26 04/13/2017 1150   CO2 22 07/09/2016 1038   BUN 17 04/13/2017 1150   BUN 19.0 07/09/2016 1038   CREATININE 1.0 04/13/2017 1150   CREATININE 0.9 07/09/2016 1038      Component Value Date/Time   CALCIUM 9.4 04/13/2017 1150   CALCIUM 9.5 07/09/2016 1038   ALKPHOS 64 04/13/2017 1150   ALKPHOS 85 07/09/2016 1038   AST 22 04/13/2017 1150   AST 22 07/09/2016 1038   ALT 26 04/13/2017 1150   ALT 22 07/09/2016 1038   BILITOT 0.70 04/13/2017 1150   BILITOT 0.62 07/09/2016 1038      Impression and Plan: Connor [name] is a very pleasant 72 yo caucasian gentleman with polycythemia. His Hct is 45.4% so we will proceed with with  phlebotomy today as planned.  His only complaint at this time is occasional fatigue and joint aches.  He will continue to take his aspirin daily.  We will see him back in another 6 weeks for follow-up.  He will contact our office with any questions or concerns. We can cetainly see him sooner if need be.   Laverna Peace, NP 2/13/201912:01 PM

## 2017-05-25 NOTE — Progress Notes (Signed)
Connor [name] presents today for phlebotomy per MD orders. Phlebotomy procedure started at 1240 and ended at 1250 via phlebotomy kit to left ac by Hilario Quarry RN. 510 grams removed. Patient observed for 30 minutes after procedure without any incident. Patient tolerated procedure well. IV needle removed intact.  Pressure dressing clean, dry and intact to left ac at time of discharge.

## 2017-06-03 ENCOUNTER — Other Ambulatory Visit: Payer: Self-pay | Admitting: Family Medicine

## 2017-06-06 NOTE — Telephone Encounter (Signed)
Next Ov 07/01/17

## 2017-06-24 ENCOUNTER — Other Ambulatory Visit: Payer: Medicare Other

## 2017-06-24 DIAGNOSIS — E7800 Pure hypercholesterolemia, unspecified: Secondary | ICD-10-CM | POA: Diagnosis not present

## 2017-06-24 DIAGNOSIS — I10 Essential (primary) hypertension: Secondary | ICD-10-CM

## 2017-06-24 DIAGNOSIS — D696 Thrombocytopenia, unspecified: Secondary | ICD-10-CM

## 2017-06-25 LAB — CBC WITH DIFFERENTIAL/PLATELET
BASOS: 1 %
Basophils Absolute: 0 10*3/uL (ref 0.0–0.2)
EOS (ABSOLUTE): 0.2 10*3/uL (ref 0.0–0.4)
EOS: 3 %
HEMATOCRIT: 42.1 % (ref 37.5–51.0)
Hemoglobin: 13.5 g/dL (ref 13.0–17.7)
IMMATURE GRANULOCYTES: 0 %
Immature Grans (Abs): 0 10*3/uL (ref 0.0–0.1)
LYMPHS ABS: 1 10*3/uL (ref 0.7–3.1)
Lymphs: 21 %
MCH: 26.8 pg (ref 26.6–33.0)
MCHC: 32.1 g/dL (ref 31.5–35.7)
MCV: 84 fL (ref 79–97)
MONOS ABS: 0.4 10*3/uL (ref 0.1–0.9)
Monocytes: 8 %
NEUTROS PCT: 67 %
Neutrophils Absolute: 3.4 10*3/uL (ref 1.4–7.0)
Platelets: 150 10*3/uL (ref 150–379)
RBC: 5.04 x10E6/uL (ref 4.14–5.80)
RDW: 15.3 % (ref 12.3–15.4)
WBC: 5 10*3/uL (ref 3.4–10.8)

## 2017-06-25 LAB — BMP8+EGFR
BUN/Creatinine Ratio: 16 (ref 10–24)
BUN: 18 mg/dL (ref 8–27)
CALCIUM: 9.4 mg/dL (ref 8.6–10.2)
CHLORIDE: 107 mmol/L — AB (ref 96–106)
CO2: 24 mmol/L (ref 20–29)
Creatinine, Ser: 1.16 mg/dL (ref 0.76–1.27)
GFR calc Af Amer: 72 mL/min/{1.73_m2} (ref >59)
GFR calc non Af Amer: 62 mL/min/{1.73_m2} (ref >59)
GLUCOSE: 109 mg/dL — AB (ref 65–99)
POTASSIUM: 4.3 mmol/L (ref 3.5–5.2)
Sodium: 145 mmol/L — ABNORMAL HIGH (ref 134–144)

## 2017-06-25 LAB — LIPID PANEL
CHOLESTEROL TOTAL: 93 mg/dL — AB (ref 100–199)
Chol/HDL Ratio: 2.9 ratio (ref 0.0–5.0)
HDL: 32 mg/dL — AB (ref >39)
LDL Calculated: 48 mg/dL (ref 0–99)
TRIGLYCERIDES: 66 mg/dL (ref 0–149)
VLDL CHOLESTEROL CAL: 13 mg/dL (ref 5–40)

## 2017-06-25 LAB — HEPATIC FUNCTION PANEL
ALK PHOS: 69 IU/L (ref 39–117)
ALT: 18 IU/L (ref 0–44)
AST: 26 IU/L (ref 0–40)
Albumin: 4.1 g/dL (ref 3.5–4.8)
BILIRUBIN, DIRECT: 0.21 mg/dL (ref 0.00–0.40)
Bilirubin Total: 0.6 mg/dL (ref 0.0–1.2)
TOTAL PROTEIN: 6.2 g/dL (ref 6.0–8.5)

## 2017-06-28 ENCOUNTER — Other Ambulatory Visit: Payer: Medicare Other

## 2017-06-28 DIAGNOSIS — Z1212 Encounter for screening for malignant neoplasm of rectum: Secondary | ICD-10-CM

## 2017-06-29 ENCOUNTER — Ambulatory Visit: Payer: Medicare Other | Admitting: Family Medicine

## 2017-07-01 ENCOUNTER — Encounter: Payer: Self-pay | Admitting: Family Medicine

## 2017-07-01 ENCOUNTER — Ambulatory Visit (INDEPENDENT_AMBULATORY_CARE_PROVIDER_SITE_OTHER): Payer: Medicare Other | Admitting: Family Medicine

## 2017-07-01 VITALS — BP 119/68 | HR 95 | Temp 97.3°F | Ht 71.0 in | Wt 236.0 lb

## 2017-07-01 DIAGNOSIS — R251 Tremor, unspecified: Secondary | ICD-10-CM

## 2017-07-01 DIAGNOSIS — E559 Vitamin D deficiency, unspecified: Secondary | ICD-10-CM | POA: Diagnosis not present

## 2017-07-01 DIAGNOSIS — N5201 Erectile dysfunction due to arterial insufficiency: Secondary | ICD-10-CM

## 2017-07-01 DIAGNOSIS — J449 Chronic obstructive pulmonary disease, unspecified: Secondary | ICD-10-CM

## 2017-07-01 DIAGNOSIS — D696 Thrombocytopenia, unspecified: Secondary | ICD-10-CM | POA: Diagnosis not present

## 2017-07-01 DIAGNOSIS — R110 Nausea: Secondary | ICD-10-CM | POA: Diagnosis not present

## 2017-07-01 DIAGNOSIS — N400 Enlarged prostate without lower urinary tract symptoms: Secondary | ICD-10-CM | POA: Diagnosis not present

## 2017-07-01 DIAGNOSIS — I10 Essential (primary) hypertension: Secondary | ICD-10-CM

## 2017-07-01 DIAGNOSIS — E7800 Pure hypercholesterolemia, unspecified: Secondary | ICD-10-CM | POA: Diagnosis not present

## 2017-07-01 DIAGNOSIS — C44321 Squamous cell carcinoma of skin of nose: Secondary | ICD-10-CM

## 2017-07-01 DIAGNOSIS — D45 Polycythemia vera: Secondary | ICD-10-CM

## 2017-07-01 LAB — FECAL OCCULT BLOOD, IMMUNOCHEMICAL: FECAL OCCULT BLD: NEGATIVE

## 2017-07-01 NOTE — Patient Instructions (Addendum)
Medicare Annual Wellness Visit  [facility] and the medical providers at [facility] strive to bring you the best medical care.  In doing so we not only want to address your current medical conditions and concerns but also to detect new conditions early and prevent illness, disease and health-related problems.    Medicare offers a yearly Wellness Visit which allows our clinical staff to assess your need for preventative services including immunizations, lifestyle education, counseling to decrease risk of preventable diseases and screening for fall risk and other medical concerns.    This visit is provided free of charge (no copay) for all Medicare recipients. The clinical pharmacists at [facility] have begun to conduct these Wellness Visits which will also include a thorough review of all your medications.    As you primary medical provider recommend that you make an appointment for your Annual Wellness Visit if you have not done so already this year.  You may set up this appointment before you leave today or you may call back ([phone]) and schedule an appointment.  Please make sure when you call that you mention that you are scheduling your Annual Wellness Visit with the clinical pharmacist so that the appointment may be made for the proper length of time.   Continue current medications. Continue good therapeutic lifestyle changes which include good diet and exercise. Fall precautions discussed with patient. If an FOBT was given today- please return it to our front desk. If you are over 69 years old - you may need Prevnar 77 or the adult Pneumonia vaccine.  **Flu shots are available--- please call and schedule a FLU-CLINIC appointment**  After your visit with [place] today you will receive a survey in the mail or online from [employer] regarding your care with [place]. Please take a moment to fill this out. Your feedback is very important  to [place] as you can help [place] better understand your patient needs as well as improve your experience and satisfaction. WE CARE ABOUT YOU!!!   Continue to follow-up with specialist and cardiology and hematology, Drs. Claiborne Billings and Marin Olp. Remember, the flu season is still here and he should continue to practice good respiratory protection. Follow-up with urology as planned Stop the Celebrex and use of mints Restart ranitidine 150 mg twice daily at least for 4-6 weeks before breakfast and supper Take extra strength Tylenol or Tylenol arthritis if needed for joint pain Try to leave off all NSAIDs like Celebrex Aleve and ibuprofen

## 2017-07-01 NOTE — Progress Notes (Signed)
Subjective:    Patient ID: Connor [name], male    DOB: 1945/09/06, 72 y.o.   MRN: [id number]  HPI Pt here for follow up and management of chronic medical problems which includes hyperlipidemia and hypertension. He is taking medication regularly.  Patient has had lab work and this will be reviewed with him during the visit today.  He is followed regularly by Dr. Claiborne Billings his cardiologist and by [name] his hematologist.  He sees [name] because of his polycythemia.  He does have cardiomegaly and carotid artery stenosis which is asymptomatic.  He also sees Dr. sued because of his bouts with COPD which is mild currently.  He has an early family history of coronary artery disease.  All liver function tests on this patient were negative and within normal limits.  The the blood sugar was slightly elevated at 109.  The creatinine, the most important kidney function test was within normal limits.  The electrolytes were good except the serum sodium was slightly elevated and the chloride was slightly elevated and we will continue to monitor this.  The potassium was good.  All cholesterol numbers were excellent except the good cholesterol was low.  The LDL C was good at 48.  CBC was within normal limits with a normal white blood cell count, a stable hemoglobin at 13.5 and an adequate platelet count of 150,000 which has been low in the past.  The patient denies any chest pain or shortness of breath anymore than usual.  He has had a queasy feeling in his stomach or a little nausea at times sometimes before eating sometimes after eating.  He does eat minutes and sometimes a lot of minutes.  He is also been taking some Celebrex for his shoulder from the orthopedist and he is not taking his ranitidine like he did in the past.  After discussion about this we are going to try to stop the Celebrex in the [facility] and go back on the ranitidine to see if this does not help some with his stomach.  He is also going to return in  FOBT.  He has not seen any blood in the stool or had any black tarry bowel movements nor has he had any vomiting.  He is passing his water without problems and sees the urologist soon and he is taking Uroxatrol for this.  He also describes a tremor in his right arm and says that there is a history of tremor in his family.  He is not causing any major problems currently but we talked about watching this and if it gets worse we will have him see the neurologist for further evaluation.       Patient Active Problem List   Diagnosis Date Noted  . 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 Iron malabsorption 05/28/2016  . Hyperlipidemia LDL goal <100 04/12/2014  . Polycythemia vera ([place]) 04/19/2013  . Family history of early CAD 02/19/2013  . Iron deficiency anemia 09/01/2012  . H/O vitamin D deficiency 05/21/2012  . Diverticulosis of colon 05/21/2012  . Carotid artery stenosis, asymptomatic 05/21/2012  . BPH (benign prostatic hyperplasia)   . Cardiomegaly   . Thrombocytopenia ([place])   . Lap Nissen Fundoplication August 3614 11/26/2011  . Weight gain, abnormal 08/27/2009  . ESSENTIAL HYPERTENSION, BENIGN 08/27/2009  . OSA (obstructive sleep apnea) 02/19/2008  . COPD, mild ([place]) 05/26/2007   Outpatient Encounter Medications as of 07/01/2017  Medication Sig  . alfuzosin (UROXATRAL) 10 MG 24  hr tablet Take 10 mg by mouth every morning.   [name] aspirin 81 MG tablet Take 81 mg by mouth 2 (two) times daily.   . Cholecalciferol (VITAMIN D-3) 5000 UNITS TABS Take 1 tablet by mouth daily.   . Coenzyme Q10 (CO Q 10 PO) Take 1 capsule by mouth daily.   . cyclobenzaprine (FLEXERIL) 10 MG tablet TAKE 1 TABLET (10 MG TOTAL) BY MOUTH 3 (THREE) TIMES DAILY AS NEEDED FOR MUSCLE SPASMS.  . fish oil-omega-3 fatty acids 1000 MG capsule Take 2 g by mouth daily.   . Glucosamine-Chondroit-Vit C-Mn (GLUCOSAMINE CHONDR 1500 COMPLX) CAPS Take by mouth every morning.  . Ipratropium-Albuterol (COMBIVENT) 20-100  MCG/ACT AERS respimat Inhale 1 puff into the lungs as needed for wheezing or shortness of breath.  . losartan (COZAAR) 50 MG tablet Take 1 tablet (50 mg total) by mouth daily. KEEP OV.  . montelukast (SINGULAIR) 10 MG tablet TAKE 1 TABLET AT BEDTIME  . Multiple Vitamins-Minerals (CENTRUM SILVER ULTRA MENS) TABS Take 1 tablet by mouth every morning. Once a day  . polyethylene glycol (MIRALAX / GLYCOLAX) packet Take 17 g by mouth as needed.   . Probiotic Product (PHILLIPS COLON HEALTH PO) Take 1 capsule by mouth daily.   [name] pyridOXINE (VITAMIN B-6) 100 MG tablet Take 100 mg by mouth daily.   . rosuvastatin (CRESTOR) 10 MG tablet TAKE 1 TABLET (10 MG TOTAL) BY MOUTH DAILY.  . sildenafil (REVATIO) 20 MG tablet Take 2-5 tabs prior to sexual activity  . Turmeric 500 MG CAPS Take by mouth every morning.  . vitamin B-12 (CYANOCOBALAMIN) 500 MCG tablet Take 500 mcg by mouth daily.   No facility-administered encounter medications on file as of 07/01/2017.      Review of Systems  Constitutional: Negative.   HENT: Negative.   Eyes: Negative.   Respiratory: Negative.   Cardiovascular: Negative.   Gastrointestinal: Positive for nausea.  Endocrine: Negative.   Genitourinary: Negative.   Musculoskeletal: Negative.   Skin: Negative.   Allergic/Immunologic: Negative.   Neurological: Positive for tremors (right ).  Hematological: Negative.   Psychiatric/Behavioral: Negative.        Objective:   Physical Exam  Constitutional: He is oriented to person, place, and time. He appears well-developed and well-nourished. No distress.  Patient is pleasant and relaxed.  HENT:  Head: Normocephalic and atraumatic.  Right Ear: External ear normal.  Left Ear: External ear normal.  Mouth/Throat: Oropharynx is clear and moist. No oropharyngeal exudate.  Nasal turbinate congestion bilaterally  Eyes: Pupils are equal, round, and reactive to light. Conjunctivae and EOM are normal. Right eye exhibits no discharge.  Left eye exhibits no discharge. No scleral icterus.  Neck: Normal range of motion. Neck supple. No thyromegaly present.  No bruits thyromegaly or anterior cervical adenopathy  Cardiovascular: Normal rate, regular rhythm, normal heart sounds and intact distal pulses.  No murmur heard. Heart is regular at 84/min  Pulmonary/Chest: Effort normal and breath sounds normal. No respiratory distress. He has no wheezes. He has no rales. He exhibits no tenderness.  Clear anteriorly and posteriorly and no axillary adenopathy and no chest wall masses  Abdominal: Soft. Bowel sounds are normal. He exhibits no mass. There is no tenderness. There is no rebound and no guarding.  Minimal epigastric tenderness.  No liver or spleen enlargement.  No masses.  Normal bowel sounds without bruits.  No inguinal adenopathy.  Genitourinary:  Genitourinary Comments: The patient will see the urologist soon and he follows him regularly.  Musculoskeletal: Normal range of motion. He exhibits no edema.  Lymphadenopathy:    He has no cervical adenopathy.  Neurological: He is alert and oriented to person, place, and time. He has normal reflexes. No cranial nerve deficit.  Skin: Skin is warm and dry. No rash noted.  Psychiatric: He has a normal mood and affect. His behavior is normal. Judgment and thought content normal.  Nursing note and vitals reviewed.  BP 119/68 (BP Location: Left Arm)   Pulse 95   Temp (!) 97.3 F (36.3 C) (Oral)   Ht 5\' 11"  (1.803 m)   Wt 236 lb (107 kg)   BMI 32.92 kg/m         Assessment & Plan:  1. Pure hypercholesterolemia -Continue Crestor as doing and work with as aggressive therapeutic lifestyle changes as possible and get as much exercise as possible  2. Essential hypertension, benign -Blood pressure is good today and he will continue with current treatment  3. Benign prostatic hyperplasia, unspecified whether lower urinary tract symptoms present -Follow-up with urology as  planned  4. Vitamin D deficiency -Continue vitamin D replacement  5. Thrombocytopenia (HCC) -No complaints with bleeding or increased bruising.  6. COPD, mild ([place]) -Follow-up with pulmonology as planned  7. Polycythemia vera ([place]) -Follow-up with hematology as planned  8. Squamous cell cancer of skin of nose -Follow-up with dermatology as needed  9. Erectile dysfunction due to arterial insufficiency -Continue with current treatment and follow-up with urology  10. Nausea in adult -Discontinue Mints and all NSAIDs and only take extra strength Tylenol for pain and restart ranitidine twice daily before breakfast and supper  11.  Tremor -We will continue to monitor this and if it progresses we will do a referral to neurology. -Reduce caffeine intake.  Patient Instructions                       Medicare Annual Wellness Visit  [facility] and the medical providers at [facility] strive to bring you the best medical care.  In doing so we not only want to address your current medical conditions and concerns but also to detect new conditions early and prevent illness, disease and health-related problems.    Medicare offers a yearly Wellness Visit which allows our clinical staff to assess your need for preventative services including immunizations, lifestyle education, counseling to decrease risk of preventable diseases and screening for fall risk and other medical concerns.    This visit is provided free of charge (no copay) for all Medicare recipients. The clinical pharmacists at [facility] have begun to conduct these Wellness Visits which will also include a thorough review of all your medications.    As you primary medical provider recommend that you make an appointment for your Annual Wellness Visit if you have not done so already this year.  You may set up this appointment before you leave today or you may call back ([phone]) and  schedule an appointment.  Please make sure when you call that you mention that you are scheduling your Annual Wellness Visit with the clinical pharmacist so that the appointment may be made for the proper length of time.   Continue current medications. Continue good therapeutic lifestyle changes which include good diet and exercise. Fall precautions discussed with patient. If an FOBT was given today- please return it to our front desk. If you are over 33 years old - you may need Prevnar 13 or the  adult Pneumonia vaccine.  **Flu shots are available--- please call and schedule a FLU-CLINIC appointment**  After your visit with [place] today you will receive a survey in the mail or online from [employer] regarding your care with [place]. Please take a moment to fill this out. Your feedback is very important to [place] as you can help [place] better understand your patient needs as well as improve your experience and satisfaction. WE CARE ABOUT YOU!!!   Continue to follow-up with specialist and cardiology and hematology, Drs. Claiborne Billings and Marin Olp. Remember, the flu season is still here and he should continue to practice good respiratory protection. Follow-up with urology as planned Stop the Celebrex and use of mints Restart ranitidine 150 mg twice daily at least for 4-6 weeks before breakfast and supper Take extra strength Tylenol or Tylenol arthritis if needed for joint pain Try to leave off all NSAIDs like Celebrex Aleve and ibuprofen  Arrie Senate MD

## 2017-07-04 ENCOUNTER — Encounter: Payer: Self-pay | Admitting: Cardiovascular Disease

## 2017-07-04 ENCOUNTER — Ambulatory Visit (INDEPENDENT_AMBULATORY_CARE_PROVIDER_SITE_OTHER): Payer: Medicare Other | Admitting: Cardiovascular Disease

## 2017-07-04 VITALS — BP 149/74 | HR 79 | Ht 71.5 in | Wt 238.2 lb

## 2017-07-04 DIAGNOSIS — G4733 Obstructive sleep apnea (adult) (pediatric): Secondary | ICD-10-CM | POA: Diagnosis not present

## 2017-07-04 DIAGNOSIS — D751 Secondary polycythemia: Secondary | ICD-10-CM | POA: Diagnosis not present

## 2017-07-04 DIAGNOSIS — E785 Hyperlipidemia, unspecified: Secondary | ICD-10-CM

## 2017-07-04 DIAGNOSIS — E669 Obesity, unspecified: Secondary | ICD-10-CM

## 2017-07-04 DIAGNOSIS — I10 Essential (primary) hypertension: Secondary | ICD-10-CM | POA: Diagnosis not present

## 2017-07-04 NOTE — Patient Instructions (Signed)

## 2017-07-04 NOTE — Progress Notes (Signed)
Patient ID: Connor [name], male   DOB: 03-Jan-1946, 72 y.o.   MRN: [id number]    HPI: Connor [name] is a 72 y.o. male who presents to the office for a 13 month cardiology evaluation.   [name] has a history of significant hyperlipidemia and remotely had been on combination therapy with Niaspan for some time and had taken Zetia as well as Crestor.  He has a strong family history for coronary artery disease and 2 of his younger brothers have had previous  coronary artery stenting. He  has a history of GERD and is status post laparoscopic Nissen fundoplication by [name] Done due to hiatal hernia severe GERD. An echo Doppler study in November 2013 showed an ejection fraction > 55% with grade 1 diastolic dysfunction. He had mild left atrial enlargement and evidence for mild aortic valve sclerosis. He underwent carotid duplex imaging which was normal. A nuclear perfusion study showed normal perfusion and function.  When I saw him 3 years ago he had microcytic indices and was found to be iron deficient. He also subsequently developed thrombocytopenia and has been under the care of [name].  He has been diagnosed with polycythemia vera-JAK2 negative and chronic immune thrombocytopenia.  He apparently did receive IV iron therapy, and underwent ultrasound of the spleen as well as bone marrow. He also has undergone colonoscopy.   He is on CPAP therapy for obstructive sleep apnea.  A follow-up echo Doppler study on 02/11/2014 showed an ejection fraction at 50-55% without regional wall motion abnormalities, but with continued grade 1 diastolic dysfunction.  The left atrium was mildly dilated.   Since I last saw himin January 2018, he has remained stable from a cardiac standpoint.  Specifically, he denies chest pain, PND, orthopnea.  He is on losartan 50 mg for hypertension. He continues to be on rosuvastatin 10 mg and omega-3 fatty acids for his mixed hyperlipidemia.  He is on Combivent and Singulair for mild  COPD.  He has polycythemia vera and is J K2 negative and chronic immune thrombocytopenia for which she is followed by [name].  He recently saw [name] with complaints of some mild nondescript nausea. A complete set of laboratory was done 10 days ago, which were excellent.  Essentially, LDL cholesterol was 48 with total cholesterol 93.  HDL was 32.  He had normal LFTs.  CBC was normal.  Platelets are 150,000.  He denies palpitations.  He continues to use CPAP with 100% compliance.  He is unaware of breakthrough snoring.  His sleep is restorative.  He presents for one-year evaluation.  Past Medical History:  Diagnosis Date  . Anemia   . BPH (benign prostatic hyperplasia)   . Cancer (HCC)    squamous cell on nose , inside nose   . Cardiomegaly   . Carotid artery stenosis   . COPD, mild ([place]) 05/26/2007   PFT 09/09/11>>FEV1 3.06 (95%), FEV1% 69, FEF 25-75% 1.70 (59%), TLC 7.67 (109%), DLCO 108%, no BD    . Cough 05/26/2007  . Erectile dysfunction   . GERD (gastroesophageal reflux disease)   . H/O vitamin D deficiency   . History of nuclear stress test    this revealed normal myocardial perfusion and function. Post stress EF was 57%. There was no region of scar or ischemia.  [name] Hx of echocardiogram 02/15/2012   This showed mild concentric LVH. EF > 55%. H edid have grade 1 diastolic dysfunction with mitral valve E:A ratio of 0.81, his left  atrium was mildly dilated by volume assessment at 33.3 mL/m2. He did have mild tricupid regurgitation with upper normal RV systolic pressure at 59FM. He had aortic valve sclerosis without stenosis.  . Hyperlipemia   . Iron deficiency anemia, unspecified 09/01/2012  . Iron malabsorption 05/28/2016  . Nephrolithiasis    stones   . Peyronie's disease   . Polycythemia vera(238.4) 04/19/2013  . Shortness of breath    with exertion   . Sleep apnea    Cpap-   . Thrombocytopenia ([place])     Past Surgical History:  Procedure Laterality Date  . APPENDECTOMY      . EYE SURGERY Bilateral    eye lid surgery  . HIATAL HERNIA REPAIR  11/24/2011   Procedure: LAPAROSCOPIC REPAIR OF HIATAL HERNIA;  Surgeon: Pedro Earls, MD;  Location: [facility];  Service: General;;  . KNEE SURGERY     right  . LAPAROSCOPIC NISSEN FUNDOPLICATION  3/84/6659   Procedure: LAPAROSCOPIC NISSEN FUNDOPLICATION;  Surgeon: Pedro Earls, MD;  Location: [facility];  Service: General;  Laterality: N/A;  . NOSE SURGERY  2013  . SHOULDER SURGERY     left    Allergies  Allergen Reactions  . Dexlansoprazole Nausea And Vomiting    Current Outpatient Medications  Medication Sig Dispense Refill  . alfuzosin (UROXATRAL) 10 MG 24 hr tablet Take 10 mg by mouth every morning.     [name] aspirin 81 MG tablet Take 81 mg by mouth 2 (two) times daily.     . Cholecalciferol (VITAMIN D-3) 5000 UNITS TABS Take 1 tablet by mouth daily.     . Coenzyme Q10 (CO Q 10 PO) Take 1 capsule by mouth daily.     . cyclobenzaprine (FLEXERIL) 10 MG tablet TAKE 1 TABLET (10 MG TOTAL) BY MOUTH 3 (THREE) TIMES DAILY AS NEEDED FOR MUSCLE SPASMS. 30 tablet 1  . fish oil-omega-3 fatty acids 1000 MG capsule Take 2 g by mouth daily.     . Glucosamine-Chondroit-Vit C-Mn (GLUCOSAMINE CHONDR 1500 COMPLX) CAPS Take by mouth every morning.    . Ipratropium-Albuterol (COMBIVENT) 20-100 MCG/ACT AERS respimat Inhale 1 puff into the lungs as needed for wheezing or shortness of breath. 1 Inhaler 11  . losartan (COZAAR) 50 MG tablet Take 1 tablet (50 mg total) by mouth daily. KEEP OV. 90 tablet 0  . montelukast (SINGULAIR) 10 MG tablet TAKE 1 TABLET AT BEDTIME 30 tablet 4  . Multiple Vitamins-Minerals (CENTRUM SILVER ULTRA MENS) TABS Take 1 tablet by mouth every morning. Once a day    . polyethylene glycol (MIRALAX / GLYCOLAX) packet Take 17 g by mouth as needed.     . Probiotic Product (PHILLIPS COLON HEALTH PO) Take 1 capsule by mouth daily.     [name] pyridOXINE (VITAMIN B-6) 100 MG tablet Take 100 mg by mouth daily.     .  rosuvastatin (CRESTOR) 10 MG tablet TAKE 1 TABLET (10 MG TOTAL) BY MOUTH DAILY. 90 tablet 0  . sildenafil (REVATIO) 20 MG tablet Take 2-5 tabs prior to sexual activity 50 tablet 1  . Turmeric 500 MG CAPS Take by mouth every morning.    . vitamin B-12 (CYANOCOBALAMIN) 500 MCG tablet Take 500 mcg by mouth daily.     No current facility-administered medications for this visit.     Social History   Socioeconomic History  . Marital status: Married    Spouse name: Not on file  . Number of children: 3  . Years of education: Not on  file  . Highest education level: Not on file  Occupational History  . 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 Financial resource strain: Not on file  . Food insecurity:    Worry: Not on file    Inability: Not on file  . Transportation needs:    Medical: Not on file    Non-medical: Not on file  Tobacco Use  . Smoking status: Former Smoker    Packs/day: 0.25    Years: 11.00    Pack years: 2.75    Types: Cigarettes    Start date: 05/17/1958    Last attempt to quit: 04/12/1966    Years since quitting: 51.2  . Smokeless tobacco: Never Used  . Tobacco comment: quit smoking in 1968  Substance and Sexual Activity  . Alcohol use: Yes    Alcohol/week: 0.0 oz    Comment: seldom  . Drug use: No  . Sexual activity: Not on file  Lifestyle  . Physical activity:    Days per week: Not on file    Minutes per session: Not on file  . Stress: Not on file  Relationships  . Social connections:    Talks on phone: Not on file    Gets together: Not on file    Attends religious service: Not on file    Active member of club or organization: Not on file    Attends meetings of clubs or organizations: Not on file    Relationship status: Not on file  . Intimate partner violence:    Fear of current or ex partner: Not on file    Emotionally abused: Not on file    Physically abused: Not on file    Forced sexual activity: Not  on file  Other Topics Concern  . Not on file  Social History Narrative  . Not on file    Family History  Problem Relation Age of Onset  . Heart disease Mother   . Diabetes Mother   . Heart disease Father   . Heart disease Sister   . Arthritis Sister   . Diabetes Brother   . Hypertension Brother   . Heart attack Brother        enlarged heart  . Liver cancer Paternal Grandfather   . Throat cancer Maternal Grandfather   . Hemachromatosis Daughter   . Arthritis Daughter   . GI problems Daughter   . Thyroid disease Daughter   . Hypertension Daughter   . Heart disease Paternal Grandmother   . Thyroid disease Daughter        pituitary tumor  . Migraines Daughter   . Colon cancer Neg Hx   . Esophageal cancer Neg Hx   . Rectal cancer Neg Hx   . Stomach cancer Neg Hx    Social history is notable in that he is married has 3 children 5 grandchildren and 3 stepgrandchildren. He is retired. No tobacco history. He does drink rare wine. He exercises several days per week for 30-45 minutes.    ROS General: Negative; No fevers, chills, or night sweats;  HEENT: Positive for IIb.  Eyelids No changes in vision or hearing, sinus congestion, difficulty swallowing Pulmonary: Negative; No cough, wheezing, shortness of breath, hemoptysis Cardiovascular: Negative; No chest pain, presyncope, syncope, palpitations GI: Positive for GERD; No nausea, vomiting, diarrhea, or abdominal pain GU: Negative; No dysuria, hematuria, or difficulty voiding Musculoskeletal: Negative; no myalgias, joint pain, or weakness Hematologic/Oncology: Positive  for polycythemia vera for which he undergoes phlebotomy to keep his hematocrit less than 45; chronic immune thrombocytopenia; no easy bruising, bleeding Endocrine: Negative; no heat/cold intolerance; no diabetes Neuro: Negative; no changes in balance, headaches Skin: Negative; No rashes or skin lesions Psychiatric: Negative; No behavioral problems,  depression Sleep: Positive for obstructive sleep apnea on CPAP therapy No snoring, daytime sleepiness, hypersomnolence, bruxism, restless legs, hypnogognic hallucinations, no cataplexy Other comprehensive 14 point system review is negative.   PE BP (!) 149/74   Pulse 79   Ht 5' 11.5" (1.816 m)   Wt 238 lb 3.2 oz (108 kg)   BMI 32.76 kg/m    Repeat blood pressure by me was improved at 132/72  Wt Readings from Last 3 Encounters:  07/04/17 238 lb 3.2 oz (108 kg)  07/01/17 236 lb (107 kg)  05/25/17 238 lb 6.4 oz (108.1 kg)   General: Alert, oriented, no distress.  Skin: normal turgor, no rashes, warm and dry HEENT: Normocephalic, atraumatic. Pupils equal round and reactive to light; sclera anicteric; extraocular muscles intact;  Nose without nasal septal hypertrophy Mouth/Parynx benign; Mallinpatti scale 3 Neck: No JVD, no carotid bruits; normal carotid upstroke Lungs: clear to ausculatation and percussion; no wheezing or rales Chest wall: without tenderness to palpitation Heart: PMI not displaced, RRR, s1 s2 normal, 1/6 systolic murmur, no diastolic murmur, no rubs, gallops, thrills, or heaves Abdomen: soft, nontender; no hepatosplenomehaly, BS+; abdominal aorta nontender and not dilated by palpation., No bruits. No right upper quadrant tenderness. Back: no CVA tenderness Pulses 2+ Musculoskeletal: full range of motion, normal strength, no joint deformities Extremities: no clubbing cyanosis or edema, Homan's sign negative  Neurologic: grossly nonfocal; Cranial nerves grossly wnl Psychologic: Normal mood and affect  ECG (independently read by me): normal sinus rhythm at 72 bpm.  No ectopy.  Normal intervals.  January 2018 ECG (independently read by me): Normal sinus rhythm at 83 bpm.  No ectopy.  Normal intervals.  No ST segment changes.  January 2017 ECG (independently read by me):  Normal sinus rhythm at 75 bpm.  Small Q wave in lead 3.  No ST segment changes.  Normal  intervals.  December 2015 ECG (independently read by me): Normal sinus rhythm at 86 bpm.  Q wave in lead 3.  No ST segment changes.  October 2015 ECG (independently read by me): Normal sinus rhythm at 77 bpm.  No ectopy.  Normal intervals.  November 2014 ECG: Sinus rhythm at 77 beats per minute. No change.  LABS: BMP Latest Ref Rng & Units 06/24/2017 05/25/2017 04/13/2017  Glucose 65 - 99 mg/dL 109(H) 105 135(H)  BUN 8 - 27 mg/dL 18 17 17   Creatinine 0.76 - 1.27 mg/dL 1.16 1.18 1.0  BUN/Creat Ratio 10 - 24 16 - -  Sodium 134 - 144 mmol/L 145(H) 142 139  Potassium 3.5 - 5.2 mmol/L 4.3 4.3 4.0  Chloride 96 - 106 mmol/L 107(H) 109 104  CO2 20 - 29 mmol/L 24 24 26   Calcium 8.6 - 10.2 mg/dL 9.4 9.1 9.4   Hepatic Function Latest Ref Rng & Units 06/24/2017 05/25/2017 04/13/2017  Total Protein 6.0 - 8.5 g/dL 6.2 6.4 6.3(L)  Albumin 3.5 - 4.8 g/dL 4.1 3.8 3.5  AST 0 - 40 IU/L 26 24 22   ALT 0 - 44 IU/L 18 18 26   Alk Phosphatase 39 - 117 IU/L 69 70 64  Total Bilirubin 0.0 - 1.2 mg/dL 0.6 0.6 0.70  Bilirubin, Direct 0.00 - 0.40 mg/dL 0.21 - -  CBC Latest Ref Rng & Units 06/24/2017 05/25/2017 04/13/2017  WBC 3.4 - 10.8 x10E3/uL 5.0 5.2 5.2  Hemoglobin 13.0 - 17.7 g/dL 13.5 - 15.1  Hematocrit 37.5 - 51.0 % 42.1 45.4 46.1  Platelets 150 - 379 x10E3/uL 150 141(L) 113(L)   Lab Results  Component Value Date   MCV 84 06/24/2017   MCV 85.5 05/25/2017   MCV 84 04/13/2017   Lab Results  Component Value Date   TSH 3.090 01/03/2017   No results found for: HGBA1C   Lipid Panel     Component Value Date/Time   CHOL 93 (L) 06/24/2017 0920   CHOL 150 07/20/2012 0947   TRIG 66 06/24/2017 0920   TRIG 132 06/25/2016 0929   TRIG 128 07/20/2012 0947   HDL 32 (L) 06/24/2017 0920   HDL 28 (L) 06/25/2016 0929   HDL 31 (L) 07/20/2012 0947   CHOLHDL 2.9 06/24/2017 0920   CHOLHDL 4.0 02/11/2014 0849   VLDL 20 02/11/2014 0849   LDLCALC 48 06/24/2017 0920   LDLCALC 82 08/27/2013 0852   LDLCALC 93  07/20/2012 0947   RADIOLOGY: No results found.  IMPRESSION:  1. Essential hypertension, benign   2. Hyperlipidemia LDL goal <100   3. OSA (obstructive sleep apnea)   4. Polycythemia   5. Mild obesity     ASSESSMENT AND PLAN: Mr. Mazo is a 72 year old gentleman who has a history of mild hypertension, hyperlipidemia, strong family history for coronary artery disease, as well as polycythemia and chronic immune thrombocytopenia.  He has documented normal LV systolic function with mild LVH and grade 1 diastolic dysfunction. He is without chest pain.  He is active.  His blood pressure today is well controlled  .  BMI is consistent with mild obesity at 32.76.  I reviewed his laboratory.  LDL is excellent at 48.  He is followed Dr. Johnathan Hausen for his polycythemia vera.  He continues to be 100% compliant with CPAP therapy and denies residual symptoms.  As long as he remains stable, I will see him in one year for reevaluation.   Troy Sine, MD, [facility]  07/04/2017 3:10 PM

## 2017-07-06 ENCOUNTER — Inpatient Hospital Stay: Payer: Medicare Other | Attending: Hematology & Oncology | Admitting: Family

## 2017-07-06 ENCOUNTER — Inpatient Hospital Stay: Payer: Medicare Other

## 2017-07-06 ENCOUNTER — Other Ambulatory Visit: Payer: Self-pay

## 2017-07-06 DIAGNOSIS — J449 Chronic obstructive pulmonary disease, unspecified: Secondary | ICD-10-CM | POA: Diagnosis not present

## 2017-07-06 DIAGNOSIS — D751 Secondary polycythemia: Secondary | ICD-10-CM | POA: Diagnosis not present

## 2017-07-06 DIAGNOSIS — D500 Iron deficiency anemia secondary to blood loss (chronic): Secondary | ICD-10-CM

## 2017-07-06 DIAGNOSIS — D696 Thrombocytopenia, unspecified: Secondary | ICD-10-CM

## 2017-07-06 DIAGNOSIS — D45 Polycythemia vera: Secondary | ICD-10-CM

## 2017-07-06 LAB — CBC WITH DIFFERENTIAL (CANCER CENTER ONLY)
Basophils Absolute: 0 10*3/uL (ref 0.0–0.1)
Basophils Relative: 1 %
Eosinophils Absolute: 0.1 10*3/uL (ref 0.0–0.5)
Eosinophils Relative: 2 %
HEMATOCRIT: 43.2 % (ref 38.7–49.9)
HEMOGLOBIN: 13.9 g/dL (ref 13.0–17.1)
LYMPHS PCT: 17 %
Lymphs Abs: 0.8 10*3/uL — ABNORMAL LOW (ref 0.9–3.3)
MCH: 27.6 pg — AB (ref 28.0–33.4)
MCHC: 32.2 g/dL (ref 32.0–35.9)
MCV: 85.7 fL (ref 82.0–98.0)
MONOS PCT: 7 %
Monocytes Absolute: 0.3 10*3/uL (ref 0.1–0.9)
NEUTROS PCT: 73 %
Neutro Abs: 3.4 10*3/uL (ref 1.5–6.5)
Platelet Count: 126 10*3/uL — ABNORMAL LOW (ref 145–400)
RBC: 5.04 MIL/uL (ref 4.20–5.70)
RDW: 13.9 % (ref 11.1–15.7)
WBC: 4.7 10*3/uL (ref 4.0–10.0)

## 2017-07-06 LAB — IRON AND TIBC
Iron: 41 ug/dL — ABNORMAL LOW (ref 42–163)
SATURATION RATIOS: 11 % — AB (ref 42–163)
TIBC: 363 ug/dL (ref 202–409)
UIBC: 322 ug/dL

## 2017-07-06 LAB — PLATELET BY CITRATE

## 2017-07-06 LAB — CMP (CANCER CENTER ONLY)
ALK PHOS: 63 U/L (ref 26–84)
ALT: 20 U/L (ref 10–47)
ANION GAP: 8 (ref 5–15)
AST: 24 U/L (ref 11–38)
Albumin: 3.6 g/dL (ref 3.5–5.0)
BILIRUBIN TOTAL: 0.7 mg/dL (ref 0.2–1.6)
BUN: 15 mg/dL (ref 7–22)
CHLORIDE: 108 mmol/L (ref 98–108)
CO2: 28 mmol/L (ref 18–33)
Calcium: 8.7 mg/dL (ref 8.0–10.3)
Creatinine: 1 mg/dL (ref 0.60–1.20)
Glucose, Bld: 161 mg/dL — ABNORMAL HIGH (ref 73–118)
POTASSIUM: 3.6 mmol/L (ref 3.3–4.7)
Sodium: 144 mmol/L (ref 128–145)
TOTAL PROTEIN: 6.2 g/dL — AB (ref 6.4–8.1)

## 2017-07-06 LAB — FERRITIN: Ferritin: 6 ng/mL — ABNORMAL LOW (ref 22–316)

## 2017-07-06 NOTE — Progress Notes (Signed)
Hematology and Oncology Follow Up Visit  GANON DEMASI [id number] [birth year] 72 y.o. 07/06/2017   Principle Diagnosis:  Polycythemia vera - Triple negative Chronic immune thrombocytopenia vs medication Transient iron deficiency secondary to phlebotomies  Current Therapy:   Phlebotomy to maintain hematocrit below 45% Aspirin 81 mg by mouth daily IV iron as indicated - last dose given on 06/02/2016   Interim History:  Mr. Jiminez is here today with his wife for follow-up. He is doing well but still has some fatigue at times. He takes breaks to rest as needed.  He is iron deficient after having phlebotomies. Hgb 13.9, Hct 43.2, WBC count 4.7 and platelet count is 126.  SOB due to COPD is unchanged.  No fever, chills, cough, rash, dizziness, chest pain, palpitations, abdominal pain or changes in bowel or bladder habits.  He takes Miralax as needed for constipation.  No lymphadenopathy found on exam. No bleeding, bruising or petechiae.  He has occasional nausea without vomiting. He has maintained a good appetite and is staying well hydrated. His weight is stable.  He has chronic back issues and will have numbness and tingling in his right thigh off and on. No swelling or tenderness in her extremities.   ECOG Performance Status: 0 - Asymptomatic  Medications:  Allergies as of 07/06/2017      Reactions   Dexlansoprazole Nausea And Vomiting      Medication List        Accurate as of 07/06/17 10:59 AM. Always use your most recent med list.          alfuzosin 10 MG 24 hr tablet Commonly known as:  UROXATRAL Take 10 mg by mouth every morning.   aspirin 81 MG tablet Take 81 mg by mouth 2 (two) times daily.   CENTRUM SILVER ULTRA MENS Tabs Take 1 tablet by mouth every morning. Once a day   cetirizine 10 MG tablet Commonly known as:  ZYRTEC Take 10 mg by mouth daily.   CO Q 10 PO Take 1 capsule by mouth daily.   cyclobenzaprine 10 MG tablet Commonly known as:   FLEXERIL TAKE 1 TABLET (10 MG TOTAL) BY MOUTH 3 (THREE) TIMES DAILY AS NEEDED FOR MUSCLE SPASMS.   docusate sodium 100 MG capsule Commonly known as:  COLACE Take 200 mg by mouth daily as needed for mild constipation.   fish oil-omega-3 fatty acids 1000 MG capsule Take 2 g by mouth daily.   GLUCOSAMINE CHONDR 1500 COMPLX Caps Take by mouth every morning.   Ipratropium-Albuterol 20-100 MCG/ACT Aers respimat Commonly known as:  COMBIVENT Inhale 1 puff into the lungs as needed for wheezing or shortness of breath.   losartan 50 MG tablet Commonly known as:  COZAAR Take 1 tablet (50 mg total) by mouth daily. KEEP OV.   montelukast 10 MG tablet Commonly known as:  SINGULAIR TAKE 1 TABLET AT BEDTIME   PHILLIPS COLON HEALTH PO Take 1 capsule by mouth daily.   polyethylene glycol packet Commonly known as:  MIRALAX / GLYCOLAX Take 17 g by mouth as needed.   pyridOXINE 100 MG tablet Commonly known as:  VITAMIN B-6 Take 100 mg by mouth daily.   rosuvastatin 10 MG tablet Commonly known as:  CRESTOR TAKE 1 TABLET (10 MG TOTAL) BY MOUTH DAILY.   sildenafil 20 MG tablet Commonly known as:  REVATIO Take 2-5 tabs prior to sexual activity   Turmeric 500 MG Caps Take by mouth every morning.   vitamin B-12 500 MCG tablet Commonly  known as:  CYANOCOBALAMIN Take 500 mcg by mouth daily.   Vitamin D-3 5000 units Tabs Take 1 tablet by mouth daily.       Allergies:  Allergies  Allergen Reactions  . Dexlansoprazole Nausea And Vomiting    Past Medical History, Surgical history, Social history, and Family History were reviewed and updated.  Review of Systems: All other 10 point review of systems is negative.   Physical Exam:  vitals were not taken for this visit.   Wt Readings from Last 3 Encounters:  07/04/17 238 lb 3.2 oz (108 kg)  07/01/17 236 lb (107 kg)  05/25/17 238 lb 6.4 oz (108.1 kg)    Ocular: Sclerae unicteric, pupils equal, round and reactive to  light Ear-nose-throat: Oropharynx clear, dentition fair Lymphatic: No cervical, supraclavicular or axillary adenopathy Lungs no rales or rhonchi, good excursion bilaterally Heart regular rate and rhythm, no murmur appreciated Abd soft, nontender, positive bowel sounds, no liver or spleen tip palpated on exam, no fluid wave  MSK no focal spinal tenderness, no joint edema Neuro: non-focal, well-oriented, appropriate affect Breasts: Deferred   Lab Results  Component Value Date   WBC 4.7 07/06/2017   HGB 13.5 06/24/2017   HCT 43.2 07/06/2017   MCV 85.7 07/06/2017   PLT 126 (L) 07/06/2017   Lab Results  Component Value Date   FERRITIN 8 (L) 05/25/2017   IRON 60 05/25/2017   TIBC 366 05/25/2017   UIBC 306 05/25/2017   IRONPCTSAT 16 (L) 05/25/2017   Lab Results  Component Value Date   RETICCTPCT 0.8 09/06/2013   RBC 5.04 07/06/2017   RETICCTABS 40.2 09/06/2013   No results found for: KPAFRELGTCHN, LAMBDASER, KAPLAMBRATIO No results found for: IGGSERUM, IGA, IGMSERUM No results found for: Kathrynn Ducking, MSPIKE, SPEI   Chemistry      Component Value Date/Time   NA 145 (H) 06/24/2017 0920   NA 139 04/13/2017 1150   NA 141 07/09/2016 1038   K 4.3 06/24/2017 0920   K 4.0 04/13/2017 1150   K 4.0 07/09/2016 1038   CL 107 (H) 06/24/2017 0920   CL 104 04/13/2017 1150   CO2 24 06/24/2017 0920   CO2 26 04/13/2017 1150   CO2 22 07/09/2016 1038   BUN 18 06/24/2017 0920   BUN 17 04/13/2017 1150   BUN 19.0 07/09/2016 1038   CREATININE 1.16 06/24/2017 0920   CREATININE 1.18 05/25/2017 1107   CREATININE 1.0 04/13/2017 1150   CREATININE 0.9 07/09/2016 1038      Component Value Date/Time   CALCIUM 9.4 06/24/2017 0920   CALCIUM 9.4 04/13/2017 1150   CALCIUM 9.5 07/09/2016 1038   ALKPHOS 69 06/24/2017 0920   ALKPHOS 64 04/13/2017 1150   ALKPHOS 85 07/09/2016 1038   AST 26 06/24/2017 0920   AST 24 05/25/2017 1107   AST 22 07/09/2016  1038   ALT 18 06/24/2017 0920   ALT 18 05/25/2017 1107   ALT 26 04/13/2017 1150   ALT 22 07/09/2016 1038   BILITOT 0.6 06/24/2017 0920   BILITOT 0.6 05/25/2017 1107   BILITOT 0.62 07/09/2016 1038      Impression and Plan: Mr. Banik is a very pleasant 72 yo caucasian gentleman with polycythemia secondary to COPD. Hct today is 43.2 so no phlebotomy needed this visit.  He will continue taking his baby aspirin daily. Platelet count is stable at 126.  We will plan to see him back in another 6 weeks for follow-up.  He will  contact our office with any questions or concerns. We can certainly see her sooner if need be.   Laverna Peace, NP 3/27/201910:59 AM

## 2017-07-21 DIAGNOSIS — N401 Enlarged prostate with lower urinary tract symptoms: Secondary | ICD-10-CM | POA: Diagnosis not present

## 2017-07-21 DIAGNOSIS — R350 Frequency of micturition: Secondary | ICD-10-CM | POA: Diagnosis not present

## 2017-07-31 ENCOUNTER — Other Ambulatory Visit: Payer: Self-pay | Admitting: Hematology & Oncology

## 2017-08-05 ENCOUNTER — Ambulatory Visit: Payer: Medicare Other | Admitting: Physician Assistant

## 2017-08-16 ENCOUNTER — Other Ambulatory Visit: Payer: Self-pay | Admitting: Cardiovascular Disease

## 2017-08-17 ENCOUNTER — Inpatient Hospital Stay: Payer: Medicare Other

## 2017-08-17 ENCOUNTER — Inpatient Hospital Stay: Payer: Medicare Other | Attending: Hematology & Oncology | Admitting: Family

## 2017-08-17 ENCOUNTER — Encounter: Payer: Self-pay | Admitting: Family

## 2017-08-17 VITALS — BP 127/66 | HR 79 | Temp 97.9°F | Resp 18 | Wt 232.0 lb

## 2017-08-17 DIAGNOSIS — R200 Anesthesia of skin: Secondary | ICD-10-CM | POA: Diagnosis not present

## 2017-08-17 DIAGNOSIS — D500 Iron deficiency anemia secondary to blood loss (chronic): Secondary | ICD-10-CM

## 2017-08-17 DIAGNOSIS — D751 Secondary polycythemia: Secondary | ICD-10-CM

## 2017-08-17 DIAGNOSIS — D45 Polycythemia vera: Secondary | ICD-10-CM

## 2017-08-17 DIAGNOSIS — J449 Chronic obstructive pulmonary disease, unspecified: Secondary | ICD-10-CM | POA: Diagnosis not present

## 2017-08-17 DIAGNOSIS — D696 Thrombocytopenia, unspecified: Secondary | ICD-10-CM

## 2017-08-17 DIAGNOSIS — M545 Low back pain: Secondary | ICD-10-CM | POA: Diagnosis not present

## 2017-08-17 DIAGNOSIS — G8929 Other chronic pain: Secondary | ICD-10-CM | POA: Insufficient documentation

## 2017-08-17 LAB — CBC WITH DIFFERENTIAL (CANCER CENTER ONLY)
BASOS ABS: 0 10*3/uL (ref 0.0–0.1)
Basophils Relative: 1 %
EOS ABS: 0.1 10*3/uL (ref 0.0–0.5)
EOS PCT: 2 %
HCT: 44.6 % (ref 38.7–49.9)
Hemoglobin: 14.3 g/dL (ref 13.0–17.1)
Lymphocytes Relative: 21 %
Lymphs Abs: 1 10*3/uL (ref 0.9–3.3)
MCH: 27.3 pg — ABNORMAL LOW (ref 28.0–33.4)
MCHC: 32.1 g/dL (ref 32.0–35.9)
MCV: 85.3 fL (ref 82.0–98.0)
MONO ABS: 0.4 10*3/uL (ref 0.1–0.9)
Monocytes Relative: 8 %
Neutro Abs: 3.1 10*3/uL (ref 1.5–6.5)
Neutrophils Relative %: 68 %
PLATELETS: 134 10*3/uL — AB (ref 145–400)
RBC: 5.23 MIL/uL (ref 4.20–5.70)
RDW: 14.1 % (ref 11.1–15.7)
WBC: 4.6 10*3/uL (ref 4.0–10.0)

## 2017-08-17 LAB — CMP (CANCER CENTER ONLY)
ALT: 23 U/L (ref 10–47)
ANION GAP: 1 — AB (ref 5–15)
AST: 27 U/L (ref 11–38)
Albumin: 3.8 g/dL (ref 3.5–5.0)
Alkaline Phosphatase: 56 U/L (ref 26–84)
BUN: 22 mg/dL (ref 7–22)
CALCIUM: 9.2 mg/dL (ref 8.0–10.3)
CO2: 30 mmol/L (ref 18–33)
Chloride: 110 mmol/L — ABNORMAL HIGH (ref 98–108)
Creatinine: 1 mg/dL (ref 0.60–1.20)
GLUCOSE: 113 mg/dL (ref 73–118)
POTASSIUM: 4.4 mmol/L (ref 3.3–4.7)
SODIUM: 141 mmol/L (ref 128–145)
TOTAL PROTEIN: 6.5 g/dL (ref 6.4–8.1)
Total Bilirubin: 1 mg/dL (ref 0.2–1.6)

## 2017-08-17 NOTE — Progress Notes (Signed)
Hematology and Oncology Follow Up Visit  Connor [name] [id number] [birth year] 72 y.o. 08/17/2017   Principle Diagnosis:  Polycythemia vera - Triple negative Chronic immune thrombocytopenia vs medication Transient iron deficiency secondary to phlebotomies  Current Therapy:   Phlebotomy to maintain hematocrit below 45% Aspirin 81 mg by mouth daily IV iron as indicated - last dose given on 06/02/2016   Interim History: Connor [name] is here today with his wife for follow-up. He is feeling fatigued and having some SOB with exertion.  Hct today is 44.6% so he will need a phlebotomy this visit. Iron studies with last visit were low. We have replaced this before to help improve his symptoms with iron deficiency. We will see how low today's counts are.  No fever, chills, n/v, cough, rash, dizziness, chest pain, palpitations, abdominal pain or changes in bowel or bladder habits. No swelling or tenderness in his extremities. No lymphadenopathy noted on exam.  He still has intermittent numbness and tingling in the right thigh. He has chronic back issues and plans to discuss this with his orthopedist. He has had no weakness in his lower extremities. No falls or syncope.  No episodes of bleeding, no bruising or petechiae.    ECOG Performance Status: 1 - Symptomatic but completely ambulatory  Medications:  Allergies as of 08/17/2017      Reactions   Dexlansoprazole Nausea And Vomiting      Medication List        Accurate as of 08/17/17 12:24 PM. Always use your most recent med list.          alfuzosin 10 MG 24 hr tablet Commonly known as:  UROXATRAL Take 10 mg by mouth every morning.   aspirin 81 MG tablet Take 81 mg by mouth 2 (two) times daily.   CENTRUM SILVER ULTRA MENS Tabs Take 1 tablet by mouth every morning. Once a day   cetirizine 10 MG tablet Commonly known as:  ZYRTEC Take 10 mg by mouth daily.   CO Q 10 PO Take 1 capsule by mouth daily.   cyclobenzaprine 10 MG  tablet Commonly known as:  FLEXERIL TAKE 1 TABLET (10 MG TOTAL) BY MOUTH 3 (THREE) TIMES DAILY AS NEEDED FOR MUSCLE SPASMS.   docusate sodium 100 MG capsule Commonly known as:  COLACE Take 200 mg by mouth daily as needed for mild constipation.   fish oil-omega-3 fatty acids 1000 MG capsule Take 2 g by mouth daily.   GLUCOSAMINE CHONDR 1500 COMPLX Caps Take by mouth every morning.   Ipratropium-Albuterol 20-100 MCG/ACT Aers respimat Commonly known as:  COMBIVENT Inhale 1 puff into the lungs as needed for wheezing or shortness of breath.   losartan 50 MG tablet Commonly known as:  COZAAR Take 1 tablet (50 mg total) by mouth daily.   montelukast 10 MG tablet Commonly known as:  SINGULAIR TAKE 1 TABLET AT BEDTIME   PHILLIPS COLON HEALTH PO Take 1 capsule by mouth daily.   polyethylene glycol packet Commonly known as:  MIRALAX / GLYCOLAX Take 17 g by mouth as needed.   pyridOXINE 100 MG tablet Commonly known as:  VITAMIN B-6 Take 100 mg by mouth daily.   rosuvastatin 10 MG tablet Commonly known as:  CRESTOR TAKE 1 TABLET (10 MG TOTAL) BY MOUTH DAILY.   sildenafil 20 MG tablet Commonly known as:  REVATIO Take 2-5 tabs prior to sexual activity   Turmeric 500 MG Caps Take by mouth every morning.   vitamin B-12 500 MCG tablet Commonly  known as:  CYANOCOBALAMIN Take 500 mcg by mouth daily.   Vitamin D-3 5000 units Tabs Take 1 tablet by mouth daily.       Allergies:  Allergies  Allergen Reactions  . Dexlansoprazole Nausea And Vomiting    Past Medical History, Surgical history, Social history, and Family History were reviewed and updated.  Review of Systems: All other 10 point review of systems is negative.   Physical Exam:  weight is 232 lb (105.2 kg). His oral temperature is 97.9 F (36.6 C). His blood pressure is 127/66 and his pulse is 79. His respiration is 18 and oxygen saturation is 97%.   Wt Readings from Last 3 Encounters:  08/17/17 232 lb (105.2  kg)  07/04/17 238 lb 3.2 oz (108 kg)  07/01/17 236 lb (107 kg)    Ocular: Sclerae unicteric, pupils equal, round and reactive to light Ear-nose-throat: Oropharynx clear, dentition fair Lymphatic: No cervical, supraclavicular or axillary adenopathy Lungs no rales or rhonchi, good excursion bilaterally Heart regular rate and rhythm, no murmur appreciated Abd soft, nontender, positive bowel sounds, no liver or spleen tip palpated on exam, no fluid wave  MSK no focal spinal tenderness, no joint edema Neuro: non-focal, well-oriented, appropriate affect Breasts: Deferred   Lab Results  Component Value Date   WBC 4.6 08/17/2017   HGB 14.3 08/17/2017   HCT 44.6 08/17/2017   MCV 85.3 08/17/2017   PLT 134 (L) 08/17/2017   Lab Results  Component Value Date   FERRITIN 6 (L) 07/06/2017   IRON 41 (L) 07/06/2017   TIBC 363 07/06/2017   UIBC 322 07/06/2017   IRONPCTSAT 11 (L) 07/06/2017   Lab Results  Component Value Date   RETICCTPCT 0.8 09/06/2013   RBC 5.23 08/17/2017   RETICCTABS 40.2 09/06/2013   No results found for: KPAFRELGTCHN, LAMBDASER, KAPLAMBRATIO No results found for: Kandis Cocking, IGMSERUM No results found for: Odetta Pink, SPEI   Chemistry      Component Value Date/Time   NA 141 08/17/2017 1058   NA 145 (H) 06/24/2017 0920   NA 139 04/13/2017 1150   NA 141 07/09/2016 1038   K 4.4 08/17/2017 1058   K 4.0 04/13/2017 1150   K 4.0 07/09/2016 1038   CL 110 (H) 08/17/2017 1058   CL 104 04/13/2017 1150   CO2 30 08/17/2017 1058   CO2 26 04/13/2017 1150   CO2 22 07/09/2016 1038   BUN 22 08/17/2017 1058   BUN 18 06/24/2017 0920   BUN 17 04/13/2017 1150   BUN 19.0 07/09/2016 1038   CREATININE 1.00 08/17/2017 1058   CREATININE 1.0 04/13/2017 1150   CREATININE 0.9 07/09/2016 1038      Component Value Date/Time   CALCIUM 9.2 08/17/2017 1058   CALCIUM 9.4 04/13/2017 1150   CALCIUM 9.5 07/09/2016 1038   ALKPHOS  56 08/17/2017 1058   ALKPHOS 64 04/13/2017 1150   ALKPHOS 85 07/09/2016 1038   AST 27 08/17/2017 1058   AST 22 07/09/2016 1038   ALT 23 08/17/2017 1058   ALT 26 04/13/2017 1150   ALT 22 07/09/2016 1038   BILITOT 1.0 08/17/2017 1058   BILITOT 0.62 07/09/2016 1038      Impression and Plan: Connor [name] is a very pleasant 72 yo caucasian gentleman with polycythemia secondary to COPD. Hct today is 44.6 so he will not need a phlebotomy this visit.  He is symptomatic with fatigue and SOB. We will see what his iron studies show  and bring him back in for infusion if necessary.  We will go ahead and plan to see him back in another 6 weeks for follow-up.  They will contact our office with any questions or concerns. We can certainly see him sooner if need be.   Laverna Peace, NP 5/8/201912:24 PM

## 2017-08-18 LAB — IRON AND TIBC
Iron: 68 ug/dL (ref 42–163)
SATURATION RATIOS: 19 % — AB (ref 42–163)
TIBC: 364 ug/dL (ref 202–409)
UIBC: 296 ug/dL

## 2017-08-18 LAB — FERRITIN: FERRITIN: 7 ng/mL — AB (ref 22–316)

## 2017-08-19 ENCOUNTER — Inpatient Hospital Stay: Payer: Medicare Other

## 2017-08-19 ENCOUNTER — Other Ambulatory Visit: Payer: Self-pay

## 2017-08-19 VITALS — BP 142/60 | HR 82 | Temp 98.0°F | Resp 20

## 2017-08-19 DIAGNOSIS — K909 Intestinal malabsorption, unspecified: Secondary | ICD-10-CM

## 2017-08-19 DIAGNOSIS — D508 Other iron deficiency anemias: Secondary | ICD-10-CM

## 2017-08-19 DIAGNOSIS — G8929 Other chronic pain: Secondary | ICD-10-CM | POA: Diagnosis not present

## 2017-08-19 DIAGNOSIS — D751 Secondary polycythemia: Secondary | ICD-10-CM | POA: Diagnosis not present

## 2017-08-19 DIAGNOSIS — M545 Low back pain: Secondary | ICD-10-CM | POA: Diagnosis not present

## 2017-08-19 DIAGNOSIS — R200 Anesthesia of skin: Secondary | ICD-10-CM | POA: Diagnosis not present

## 2017-08-19 DIAGNOSIS — J449 Chronic obstructive pulmonary disease, unspecified: Secondary | ICD-10-CM | POA: Diagnosis not present

## 2017-08-19 MED ORDER — SODIUM CHLORIDE 0.9 % IV SOLN
Freq: Once | INTRAVENOUS | Status: AC
  Administered 2017-08-19: 14:00:00 via INTRAVENOUS

## 2017-08-19 MED ORDER — SODIUM CHLORIDE 0.9 % IV SOLN
510.0000 mg | Freq: Once | INTRAVENOUS | Status: AC
  Administered 2017-08-19: 510 mg via INTRAVENOUS
  Filled 2017-08-19: qty 17

## 2017-08-19 NOTE — Patient Instructions (Signed)

## 2017-08-24 DIAGNOSIS — D2262 Melanocytic nevi of left upper limb, including shoulder: Secondary | ICD-10-CM | POA: Diagnosis not present

## 2017-08-24 DIAGNOSIS — L814 Other melanin hyperpigmentation: Secondary | ICD-10-CM | POA: Diagnosis not present

## 2017-08-24 DIAGNOSIS — L821 Other seborrheic keratosis: Secondary | ICD-10-CM | POA: Diagnosis not present

## 2017-08-24 DIAGNOSIS — D225 Melanocytic nevi of trunk: Secondary | ICD-10-CM | POA: Diagnosis not present

## 2017-08-24 DIAGNOSIS — L570 Actinic keratosis: Secondary | ICD-10-CM | POA: Diagnosis not present

## 2017-08-24 DIAGNOSIS — D2372 Other benign neoplasm of skin of left lower limb, including hip: Secondary | ICD-10-CM | POA: Diagnosis not present

## 2017-08-24 DIAGNOSIS — I788 Other diseases of capillaries: Secondary | ICD-10-CM | POA: Diagnosis not present

## 2017-08-24 DIAGNOSIS — Z85828 Personal history of other malignant neoplasm of skin: Secondary | ICD-10-CM | POA: Diagnosis not present

## 2017-09-13 ENCOUNTER — Other Ambulatory Visit: Payer: Self-pay | Admitting: Family Medicine

## 2017-09-28 ENCOUNTER — Inpatient Hospital Stay: Payer: Medicare Other | Attending: Hematology & Oncology | Admitting: Family

## 2017-09-28 ENCOUNTER — Inpatient Hospital Stay: Payer: Medicare Other

## 2017-09-28 VITALS — BP 110/62 | HR 76 | Temp 98.2°F | Resp 18 | Wt 233.8 lb

## 2017-09-28 DIAGNOSIS — K909 Intestinal malabsorption, unspecified: Secondary | ICD-10-CM

## 2017-09-28 DIAGNOSIS — D500 Iron deficiency anemia secondary to blood loss (chronic): Secondary | ICD-10-CM | POA: Diagnosis present

## 2017-09-28 DIAGNOSIS — D751 Secondary polycythemia: Secondary | ICD-10-CM | POA: Diagnosis not present

## 2017-09-28 DIAGNOSIS — D45 Polycythemia vera: Secondary | ICD-10-CM

## 2017-09-28 LAB — CBC WITH DIFFERENTIAL (CANCER CENTER ONLY)
BASOS ABS: 0 10*3/uL (ref 0.0–0.1)
Basophils Relative: 1 %
EOS PCT: 3 %
Eosinophils Absolute: 0.1 10*3/uL (ref 0.0–0.5)
HEMATOCRIT: 47.9 % (ref 38.7–49.9)
Hemoglobin: 15.4 g/dL (ref 13.0–17.1)
LYMPHS PCT: 21 %
Lymphs Abs: 0.9 10*3/uL (ref 0.9–3.3)
MCH: 27.9 pg — ABNORMAL LOW (ref 28.0–33.4)
MCHC: 32.2 g/dL (ref 32.0–35.9)
MCV: 86.9 fL (ref 82.0–98.0)
MONO ABS: 0.3 10*3/uL (ref 0.1–0.9)
Monocytes Relative: 8 %
Neutro Abs: 3 10*3/uL (ref 1.5–6.5)
Neutrophils Relative %: 67 %
Platelet Count: 115 10*3/uL — ABNORMAL LOW (ref 145–400)
RBC: 5.51 MIL/uL (ref 4.20–5.70)
RDW: 15.7 % (ref 11.1–15.7)
WBC Count: 4.3 10*3/uL (ref 4.0–10.0)

## 2017-09-28 LAB — CMP (CANCER CENTER ONLY)
ALT: 26 U/L (ref 10–47)
ANION GAP: 4 — AB (ref 5–15)
AST: 27 U/L (ref 11–38)
Albumin: 3.7 g/dL (ref 3.5–5.0)
Alkaline Phosphatase: 53 U/L (ref 26–84)
BILIRUBIN TOTAL: 1 mg/dL (ref 0.2–1.6)
BUN: 22 mg/dL (ref 7–22)
CALCIUM: 9.3 mg/dL (ref 8.0–10.3)
CHLORIDE: 109 mmol/L — AB (ref 98–108)
CO2: 31 mmol/L (ref 18–33)
Creatinine: 0.9 mg/dL (ref 0.60–1.20)
Glucose, Bld: 120 mg/dL — ABNORMAL HIGH (ref 73–118)
POTASSIUM: 4.5 mmol/L (ref 3.3–4.7)
Sodium: 144 mmol/L (ref 128–145)
Total Protein: 6.1 g/dL — ABNORMAL LOW (ref 6.4–8.1)

## 2017-09-28 NOTE — Progress Notes (Signed)
Hematology and Oncology Follow Up Visit  Connor [name] [id number] Aug 14, 1945 72 y.o. 09/28/2017   Principle Diagnosis:  Secondary polycythemia (COPD) - Triple negative Chronic immune thrombocytopenia vs medication Transient iron deficiency secondary to phlebotomies  Current Therapy:   Phlebotomy to maintain hematocrit below 45% Aspirin 81 mg by mouth daily IV iron as indicated - last dose given on 06/02/2016   Interim History:  Connor [name] is here today with his wife for follow-up. He states that he feels much better after receiving the IV iron last month. His Hct today is 47.9%. He would like to donate with the red cross which is ok from our standpoint.  He has had no fever, chills, n/v, cough, rash, dizziness, SOB, chest pain, palpitations, abdominal pain or changes in bowel or bladder habits.  No swelling or tenderness in his extremities. He has intermittent numbness and tingling in the right thigh. He states that the [place] has done several scans to evaluate for cause but he has not been given the results yet.  No lymphadenopathy noted on exam.  He has a good appetite and is staying well hydrated. His weight is stable.   ECOG Performance Status: 1 - Symptomatic but completely ambulatory  Medications:  Allergies as of 09/28/2017      Reactions   Dexlansoprazole Nausea And Vomiting      Medication List        Accurate as of 09/28/17 11:26 AM. Always use your most recent med list.          alfuzosin 10 MG 24 hr tablet Commonly known as:  UROXATRAL Take 10 mg by mouth every morning.   aspirin 81 MG tablet Take 81 mg by mouth 2 (two) times daily.   CENTRUM SILVER ULTRA MENS Tabs Take 1 tablet by mouth every morning. Once a day   cetirizine 10 MG tablet Commonly known as:  ZYRTEC Take 10 mg by mouth daily.   CO Q 10 PO Take 1 capsule by mouth daily.   cyclobenzaprine 10 MG tablet Commonly known as:  FLEXERIL TAKE 1 TABLET (10 MG TOTAL) BY MOUTH 3 (THREE) TIMES DAILY  AS NEEDED FOR MUSCLE SPASMS.   docusate sodium 100 MG capsule Commonly known as:  COLACE Take 200 mg by mouth daily as needed for mild constipation.   fish oil-omega-3 fatty acids 1000 MG capsule Take 2 g by mouth daily.   GLUCOSAMINE CHONDR 1500 COMPLX Caps Take by mouth every morning.   Ipratropium-Albuterol 20-100 MCG/ACT Aers respimat Commonly known as:  COMBIVENT Inhale 1 puff into the lungs as needed for wheezing or shortness of breath.   losartan 50 MG tablet Commonly known as:  COZAAR Take 1 tablet (50 mg total) by mouth daily.   montelukast 10 MG tablet Commonly known as:  SINGULAIR TAKE 1 TABLET AT BEDTIME   PHILLIPS COLON HEALTH PO Take 1 capsule by mouth daily.   polyethylene glycol packet Commonly known as:  MIRALAX / GLYCOLAX Take 17 g by mouth as needed.   pyridOXINE 100 MG tablet Commonly known as:  VITAMIN B-6 Take 100 mg by mouth daily.   rosuvastatin 10 MG tablet Commonly known as:  CRESTOR TAKE 1 TABLET (10 MG TOTAL) BY MOUTH DAILY.   sildenafil 20 MG tablet Commonly known as:  REVATIO Take 2-5 tabs prior to sexual activity   Turmeric 500 MG Caps Take by mouth every morning.   vitamin B-12 500 MCG tablet Commonly known as:  CYANOCOBALAMIN Take 500 mcg by mouth daily.  Vitamin D-3 5000 units Tabs Take 1 tablet by mouth daily.       Allergies:  Allergies  Allergen Reactions  . Dexlansoprazole Nausea And Vomiting    Past Medical History, Surgical history, Social history, and Family History were reviewed and updated.  Review of Systems: All other 10 point review of systems is negative.   Physical Exam:  vitals were not taken for this visit.   Wt Readings from Last 3 Encounters:  08/17/17 232 lb (105.2 kg)  07/04/17 238 lb 3.2 oz (108 kg)  07/01/17 236 lb (107 kg)    Ocular: Sclerae unicteric, pupils equal, round and reactive to light Ear-nose-throat: Oropharynx clear, dentition fair Lymphatic: No cervical, supraclavicular  or axillary adenopathy Lungs no rales or rhonchi, good excursion bilaterally Heart regular rate and rhythm, no murmur appreciated Abd soft, nontender, positive bowel sounds, no liver or spleen tip palpated on exam, no fluid wave  MSK no focal spinal tenderness, no joint edema Neuro: non-focal, well-oriented, appropriate affect Breasts: Deferred   Lab Results  Component Value Date   WBC 4.3 09/28/2017   HGB 15.4 09/28/2017   HCT 47.9 09/28/2017   MCV 86.9 09/28/2017   PLT 115 (L) 09/28/2017   Lab Results  Component Value Date   FERRITIN 7 (L) 08/17/2017   IRON 68 08/17/2017   TIBC 364 08/17/2017   UIBC 296 08/17/2017   IRONPCTSAT 19 (L) 08/17/2017   Lab Results  Component Value Date   RETICCTPCT 0.8 09/06/2013   RBC 5.51 09/28/2017   RETICCTABS 40.2 09/06/2013   No results found for: KPAFRELGTCHN, LAMBDASER, KAPLAMBRATIO No results found for: Kandis Cocking, IGMSERUM No results found for: Odetta Pink, SPEI   Chemistry      Component Value Date/Time   NA 141 08/17/2017 1058   NA 145 (H) 06/24/2017 0920   NA 139 04/13/2017 1150   NA 141 07/09/2016 1038   K 4.4 08/17/2017 1058   K 4.0 04/13/2017 1150   K 4.0 07/09/2016 1038   CL 110 (H) 08/17/2017 1058   CL 104 04/13/2017 1150   CO2 30 08/17/2017 1058   CO2 26 04/13/2017 1150   CO2 22 07/09/2016 1038   BUN 22 08/17/2017 1058   BUN 18 06/24/2017 0920   BUN 17 04/13/2017 1150   BUN 19.0 07/09/2016 1038   CREATININE 1.00 08/17/2017 1058   CREATININE 1.0 04/13/2017 1150   CREATININE 0.9 07/09/2016 1038      Component Value Date/Time   CALCIUM 9.2 08/17/2017 1058   CALCIUM 9.4 04/13/2017 1150   CALCIUM 9.5 07/09/2016 1038   ALKPHOS 56 08/17/2017 1058   ALKPHOS 64 04/13/2017 1150   ALKPHOS 85 07/09/2016 1038   AST 27 08/17/2017 1058   AST 22 07/09/2016 1038   ALT 23 08/17/2017 1058   ALT 26 04/13/2017 1150   ALT 22 07/09/2016 1038   BILITOT 1.0 08/17/2017  1058   BILITOT 0.62 07/09/2016 1038      Impression and Plan: Connor [name] is a very pleasant 72 yo caucasian gentleman with secondary polycythemia. His Hct today is 47.9%. He would really like to donate with the red cross rather than phlebotomy in the office and plans to go on Saturday to a blood drive in [place].  We will plan to see him back in another 6 weeks for follow-up and lab.  They will contact our office with any questions or concerns. We can certainly see him sooner if need be.  Laverna Peace, NP 6/19/201911:26 AM

## 2017-09-29 LAB — IRON AND TIBC
IRON: 129 ug/dL (ref 42–163)
SATURATION RATIOS: 41 % — AB (ref 42–163)
TIBC: 311 ug/dL (ref 202–409)
UIBC: 183 ug/dL

## 2017-09-29 LAB — FERRITIN: Ferritin: 15 ng/mL — ABNORMAL LOW (ref 22–316)

## 2017-10-20 DIAGNOSIS — C300 Malignant neoplasm of nasal cavity: Secondary | ICD-10-CM | POA: Diagnosis not present

## 2017-10-20 DIAGNOSIS — L089 Local infection of the skin and subcutaneous tissue, unspecified: Secondary | ICD-10-CM | POA: Diagnosis not present

## 2017-11-07 ENCOUNTER — Other Ambulatory Visit: Payer: Medicare Other

## 2017-11-07 DIAGNOSIS — D696 Thrombocytopenia, unspecified: Secondary | ICD-10-CM

## 2017-11-07 DIAGNOSIS — I10 Essential (primary) hypertension: Secondary | ICD-10-CM

## 2017-11-07 DIAGNOSIS — E559 Vitamin D deficiency, unspecified: Secondary | ICD-10-CM

## 2017-11-07 DIAGNOSIS — N400 Enlarged prostate without lower urinary tract symptoms: Secondary | ICD-10-CM | POA: Diagnosis not present

## 2017-11-07 DIAGNOSIS — E7800 Pure hypercholesterolemia, unspecified: Secondary | ICD-10-CM

## 2017-11-08 ENCOUNTER — Ambulatory Visit (INDEPENDENT_AMBULATORY_CARE_PROVIDER_SITE_OTHER): Payer: Medicare Other | Admitting: Family Medicine

## 2017-11-08 ENCOUNTER — Encounter: Payer: Self-pay | Admitting: Family Medicine

## 2017-11-08 VITALS — BP 118/62 | HR 90 | Temp 97.7°F | Ht 71.5 in | Wt 231.0 lb

## 2017-11-08 DIAGNOSIS — I10 Essential (primary) hypertension: Secondary | ICD-10-CM | POA: Diagnosis not present

## 2017-11-08 DIAGNOSIS — D45 Polycythemia vera: Secondary | ICD-10-CM | POA: Diagnosis not present

## 2017-11-08 DIAGNOSIS — E7800 Pure hypercholesterolemia, unspecified: Secondary | ICD-10-CM | POA: Diagnosis not present

## 2017-11-08 DIAGNOSIS — D696 Thrombocytopenia, unspecified: Secondary | ICD-10-CM

## 2017-11-08 DIAGNOSIS — E559 Vitamin D deficiency, unspecified: Secondary | ICD-10-CM | POA: Diagnosis not present

## 2017-11-08 DIAGNOSIS — C44321 Squamous cell carcinoma of skin of nose: Secondary | ICD-10-CM

## 2017-11-08 DIAGNOSIS — N400 Enlarged prostate without lower urinary tract symptoms: Secondary | ICD-10-CM | POA: Diagnosis not present

## 2017-11-08 DIAGNOSIS — J449 Chronic obstructive pulmonary disease, unspecified: Secondary | ICD-10-CM | POA: Diagnosis not present

## 2017-11-08 LAB — VITAMIN D 25 HYDROXY (VIT D DEFICIENCY, FRACTURES): VIT D 25 HYDROXY: 44.9 ng/mL (ref 30.0–100.0)

## 2017-11-08 LAB — CBC WITH DIFFERENTIAL/PLATELET
Basophils Absolute: 0 10*3/uL (ref 0.0–0.2)
Basos: 1 %
EOS (ABSOLUTE): 0.2 10*3/uL (ref 0.0–0.4)
EOS: 4 %
HEMATOCRIT: 47.8 % (ref 37.5–51.0)
Hemoglobin: 15.4 g/dL (ref 13.0–17.7)
IMMATURE GRANS (ABS): 0 10*3/uL (ref 0.0–0.1)
Immature Granulocytes: 0 %
LYMPHS: 22 %
Lymphocytes Absolute: 0.9 10*3/uL (ref 0.7–3.1)
MCH: 27.9 pg (ref 26.6–33.0)
MCHC: 32.2 g/dL (ref 31.5–35.7)
MCV: 87 fL (ref 79–97)
MONOCYTES: 6 %
Monocytes Absolute: 0.3 10*3/uL (ref 0.1–0.9)
NEUTROS PCT: 67 %
Neutrophils Absolute: 2.9 10*3/uL (ref 1.4–7.0)
PLATELETS: 127 10*3/uL — AB (ref 150–450)
RBC: 5.51 x10E6/uL (ref 4.14–5.80)
RDW: 16 % — ABNORMAL HIGH (ref 12.3–15.4)
WBC: 4.3 10*3/uL (ref 3.4–10.8)

## 2017-11-08 LAB — HEPATIC FUNCTION PANEL
ALT: 17 IU/L (ref 0–44)
AST: 19 IU/L (ref 0–40)
Albumin: 4.2 g/dL (ref 3.5–4.8)
Alkaline Phosphatase: 63 IU/L (ref 39–117)
BILIRUBIN, DIRECT: 0.16 mg/dL (ref 0.00–0.40)
Bilirubin Total: 0.5 mg/dL (ref 0.0–1.2)
TOTAL PROTEIN: 6 g/dL (ref 6.0–8.5)

## 2017-11-08 LAB — BMP8+EGFR
BUN/Creatinine Ratio: 20 (ref 10–24)
BUN: 19 mg/dL (ref 8–27)
CALCIUM: 8.8 mg/dL (ref 8.6–10.2)
CO2: 24 mmol/L (ref 20–29)
CREATININE: 0.95 mg/dL (ref 0.76–1.27)
Chloride: 107 mmol/L — ABNORMAL HIGH (ref 96–106)
GFR calc Af Amer: 91 mL/min/{1.73_m2} (ref >59)
GFR calc non Af Amer: 79 mL/min/{1.73_m2} (ref >59)
GLUCOSE: 125 mg/dL — AB (ref 65–99)
Potassium: 4.3 mmol/L (ref 3.5–5.2)
Sodium: 144 mmol/L (ref 134–144)

## 2017-11-08 LAB — LIPID PANEL
CHOL/HDL RATIO: 3.3 ratio (ref 0.0–5.0)
Cholesterol, Total: 100 mg/dL (ref 100–199)
HDL: 30 mg/dL — ABNORMAL LOW (ref >39)
LDL CALC: 49 mg/dL (ref 0–99)
Triglycerides: 103 mg/dL (ref 0–149)
VLDL CHOLESTEROL CAL: 21 mg/dL (ref 5–40)

## 2017-11-08 MED ORDER — CYCLOBENZAPRINE HCL 10 MG PO TABS: 10.0000 mg | ORAL_TABLET | Freq: Three times a day (TID) | ORAL | 3 refills | Status: DC | PRN

## 2017-11-08 MED ORDER — ICOSAPENT ETHYL 1 G PO CAPS: 2.0000 | ORAL_CAPSULE | Freq: Two times a day (BID) | ORAL | 3 refills | Status: DC

## 2017-11-08 NOTE — Progress Notes (Signed)
Subjective:    Patient ID: Connor [name], male    DOB: 1945/08/14, 72 y.o.   MRN: [id number]  HPI Pt here for follow up and management of chronic medical problems which includes hypertension and hyperlipidemia. He is taking medication regularly.  The patient is doing well overall.  He also wants to discuss continuing to take aspirin.  He also wants a refill on Flexeril.  He has had lab work done and this will be reviewed with him during the visit today.  All of the cholesterol numbers were excellent with an LDL C being 49 and triglycerides being 103.  The good cholesterol remains low for this patient as it has been in the past.  The blood sugar was elevated at 125 and 1 month ago it was 120.  The creatinine is normal and all of the electrolytes including potassium are good except the chloride was elevated at 107.  CBC has a normal white blood cell count.  The hemoglobin is good at 15.4 and the platelet count is continuing to run low at 127,000.  Vitamin D level is good at 44.9.  All of the liver function tests were normal.  Patient has polycythemia.  He denies any chest pain pressure or tightness.  He does occasionally get short of breath with a lot of activity but no more than usual.  He is currently on an antibiotic for an nasal irritation from the ear nose and throat specialist and has a bitter taste in his mouth on the antibiotic but only has 1 more pill to take.  He has had some loose stools with that.  He otherwise denies any nausea vomiting diarrhea blood in the stool or black tarry bowel movements.  His last colonoscopy was in February 2014.  He is passing his water without problems.  As of note the last colonoscopy was in February 2014 and he was told by the gastroenterologist that he did not need another colonoscopy for 10 years.    Patient Active Problem List   Diagnosis Date Noted  . Squamous cell cancer of skin of nose 07/01/2017  . Erectile dysfunction due to arterial insufficiency  07/01/2017  . 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 Iron malabsorption 05/28/2016  . Hyperlipidemia LDL goal <100 04/12/2014  . Polycythemia vera ([place]) 04/19/2013  . Family history of early CAD 02/19/2013  . Iron deficiency anemia 09/01/2012  . H/O vitamin D deficiency 05/21/2012  . Diverticulosis of colon 05/21/2012  . Carotid artery stenosis, asymptomatic 05/21/2012  . BPH (benign prostatic hyperplasia)   . Cardiomegaly   . Thrombocytopenia ([place])   . Lap Nissen Fundoplication August 4098 11/26/2011  . Weight gain, abnormal 08/27/2009  . ESSENTIAL HYPERTENSION, BENIGN 08/27/2009  . OSA (obstructive sleep apnea) 02/19/2008  . COPD, mild ([place]) 05/26/2007   Outpatient Encounter Medications as of 11/08/2017  Medication Sig  . alfuzosin (UROXATRAL) 10 MG 24 hr tablet Take 10 mg by mouth every morning.   [name] aspirin 81 MG tablet Take 81 mg by mouth 2 (two) times daily.   . cetirizine (ZYRTEC) 10 MG tablet Take 10 mg by mouth daily.  . Cholecalciferol (VITAMIN D-3) 5000 UNITS TABS Take 1 tablet by mouth daily.   . Coenzyme Q10 (CO Q 10 PO) Take 1 capsule by mouth daily.   . cyclobenzaprine (FLEXERIL) 10 MG tablet TAKE 1 TABLET (10 MG TOTAL) BY MOUTH 3 (THREE) TIMES DAILY AS NEEDED FOR MUSCLE SPASMS.  [name] docusate sodium (COLACE) 100 MG capsule  Take 200 mg by mouth daily as needed for mild constipation.  . fish oil-omega-3 fatty acids 1000 MG capsule Take 2 g by mouth daily.   . Glucosamine-Chondroit-Vit C-Mn (GLUCOSAMINE CHONDR 1500 COMPLX) CAPS Take by mouth every morning.  . Ipratropium-Albuterol (COMBIVENT) 20-100 MCG/ACT AERS respimat Inhale 1 puff into the lungs as needed for wheezing or shortness of breath.  . losartan (COZAAR) 50 MG tablet Take 1 tablet (50 mg total) by mouth daily.  . montelukast (SINGULAIR) 10 MG tablet TAKE 1 TABLET AT BEDTIME  . Multiple Vitamins-Minerals (CENTRUM SILVER ULTRA MENS) TABS Take 1 tablet by mouth every morning. Once a day  . polyethylene glycol  (MIRALAX / GLYCOLAX) packet Take 17 g by mouth as needed.   . Probiotic Product (PHILLIPS COLON HEALTH PO) Take 1 capsule by mouth daily.   [name] pyridOXINE (VITAMIN B-6) 100 MG tablet Take 100 mg by mouth daily.   . rosuvastatin (CRESTOR) 10 MG tablet TAKE 1 TABLET (10 MG TOTAL) BY MOUTH DAILY.  . sildenafil (REVATIO) 20 MG tablet Take 2-5 tabs prior to sexual activity  . Turmeric 500 MG CAPS Take by mouth every morning.  . vitamin B-12 (CYANOCOBALAMIN) 500 MCG tablet Take 500 mcg by mouth daily.   No facility-administered encounter medications on file as of 11/08/2017.      Review of Systems  Constitutional: Negative.   HENT: Negative.   Eyes: Negative.   Respiratory: Negative.   Cardiovascular: Negative.   Gastrointestinal: Negative.   Endocrine: Negative.   Genitourinary: Negative.   Musculoskeletal: Negative.   Skin: Negative.        Recent skin infection - nose - completed antibiotic  Allergic/Immunologic: Negative.   Neurological: Negative.   Hematological: Negative.   Psychiatric/Behavioral: Negative.        Objective:   Physical Exam  Constitutional: He is oriented to person, place, and time. He appears well-developed and well-nourished. No distress.  The patient is pleasant and alert and looking forward to an upcoming trip this weekend [place]  HENT:  Head: Normocephalic and atraumatic.  Right Ear: External ear normal.  Left Ear: External ear normal.  Nose: Nose normal.  Mouth/Throat: Oropharynx is clear and moist. No oropharyngeal exudate.  Eyes: Pupils are equal, round, and reactive to light. Conjunctivae and EOM are normal. Right eye exhibits no discharge. Left eye exhibits no discharge. No scleral icterus.  He is up-to-date on his eye exam  Neck: Normal range of motion. Neck supple. No thyromegaly present.  No bruits thyromegaly or anterior cervical adenopathy  Cardiovascular: Normal rate, regular rhythm, normal heart sounds and intact distal pulses.    No murmur heard. Has a regular rate and rhythm at 72/min with good pedal pulses bilaterally and no murmurs detected.  Pulmonary/Chest: Effort normal and breath sounds normal. He has no wheezes. He has no rales. He exhibits no tenderness.  Clear anteriorly and posteriorly no axillary adenopathy or chest wall masses  Abdominal: Soft. Bowel sounds are normal. He exhibits no mass. There is no tenderness.  No liver or spleen enlargement no bruits no masses good inguinal pulses and no inguinal adenopathy.  Genitourinary:  Genitourinary Comments: Patient sees the urologist regularly.  Musculoskeletal: Normal range of motion. He exhibits no edema or tenderness.  Lymphadenopathy:    He has no cervical adenopathy.  Neurological: He is alert and oriented to person, place, and time. He displays abnormal reflex. No cranial nerve deficit.  Reflexes in the lower extremity are 2+ on the right  1+ on the left.  Skin: Skin is warm and dry. No rash noted.  Psychiatric: He has a normal mood and affect. His behavior is normal. Judgment and thought content normal.  Patient has normal mood affect and behavior.  Nursing note and vitals reviewed.  BP 118/62 (BP Location: Left Arm)   Pulse 90   Temp 97.7 F (36.5 C) (Oral)   Ht 5' 11.5" (1.816 m)   Wt 231 lb (104.8 kg)   BMI 31.77 kg/m         Assessment & Plan:  1. Pure hypercholesterolemia -All cholesterol numbers were excellent except the good cholesterol was low when he will continue with current treatment and therapeutic lifestyle changes.  2. Essential hypertension, benign -Blood pressure is good he will continue with current treatment  3. Benign prostatic hyperplasia, unspecified whether lower urinary tract symptoms present -Patient has had recent rectal exam and everything was stable and he has no symptoms passing his water.  4. Vitamin D deficiency -Continue with vitamin D replacement   5. Thrombocytopenia (H[facility]) -The platelet count was  stable and patient has had no increased bruising and has not noticed any bleeding problems.  6. Squamous cell cancer of skin of nose -He continues to follow-up with the ear nose and throat specialist on a regular basis  7. Polycythemia vera ([place]) -Continue to follow-up with hematology  8. COPD, mild ([place]) -Breathing is stable today and patient is having no trouble with wheezing chest congestion or coughing.  Meds ordered this encounter  Medications  . cyclobenzaprine (FLEXERIL) 10 MG tablet    Sig: Take 1 tablet (10 mg total) by mouth 3 (three) times daily as needed for muscle spasms.    Dispense:  30 tablet    Refill:  3  . Icosapent Ethyl (VASCEPA) 1 g CAPS    Sig: Take 2 capsules (2 g total) by mouth 2 (two) times daily.    Dispense:  120 capsule    Refill:  3   Patient Instructions                       Medicare Annual Wellness Visit  [facility] and the medical providers at [facility] strive to bring you the best medical care.  In doing so we not only want to address your current medical conditions and concerns but also to detect new conditions early and prevent illness, disease and health-related problems.    Medicare offers a yearly Wellness Visit which allows our clinical staff to assess your need for preventative services including immunizations, lifestyle education, counseling to decrease risk of preventable diseases and screening for fall risk and other medical concerns.    This visit is provided free of charge (no copay) for all Medicare recipients. The clinical pharmacists at [facility] have begun to conduct these Wellness Visits which will also include a thorough review of all your medications.    As you primary medical provider recommend that you make an appointment for your Annual Wellness Visit if you have not done so already this year.  You may set up this appointment before you leave today or you may call back  [phone]) and schedule an appointment.  Please make sure when you call that you mention that you are scheduling your Annual Wellness Visit with the clinical pharmacist so that the appointment may be made for the proper length of time.     Continue current medications. Continue good therapeutic lifestyle  changes which include good diet and exercise. Fall precautions discussed with patient. If an FOBT was given today- please return it to our front desk. If you are over 14 years old - you may need Prevnar 67 or the adult Pneumonia vaccine.  **Flu shots are available--- please call and schedule a FLU-CLINIC appointment**  After your visit with [place] today you will receive a survey in the mail or online from [employer] regarding your care with [place]. Please take a moment to fill this out. Your feedback is very important to [place] as you can help [place] better understand your patient needs as well as improve your experience and satisfaction. WE CARE ABOUT YOU!!!   Continue the coated baby aspirin once daily after eating Please check with the vascepa and if it cost too much take JR [place] Fish oil 2 g twice daily Continue with current medicine Follow-up with hematology as planned Follow-up with urology as planned Follow-up with ear nose and throat specialist as planned  Arrie Senate MD

## 2017-11-08 NOTE — Patient Instructions (Addendum)
Medicare Annual Wellness Visit  [facility] and the medical providers at [facility] strive to bring you the best medical care.  In doing so we not only want to address your current medical conditions and concerns but also to detect new conditions early and prevent illness, disease and health-related problems.    Medicare offers a yearly Wellness Visit which allows our clinical staff to assess your need for preventative services including immunizations, lifestyle education, counseling to decrease risk of preventable diseases and screening for fall risk and other medical concerns.    This visit is provided free of charge (no copay) for all Medicare recipients. The clinical pharmacists at [facility] have begun to conduct these Wellness Visits which will also include a thorough review of all your medications.    As you primary medical provider recommend that you make an appointment for your Annual Wellness Visit if you have not done so already this year.  You may set up this appointment before you leave today or you may call back ([phone]) and schedule an appointment.  Please make sure when you call that you mention that you are scheduling your Annual Wellness Visit with the clinical pharmacist so that the appointment may be made for the proper length of time.     Continue current medications. Continue good therapeutic lifestyle changes which include good diet and exercise. Fall precautions discussed with patient. If an FOBT was given today- please return it to our front desk. If you are over 19 years old - you may need Prevnar 96 or the adult Pneumonia vaccine.  **Flu shots are available--- please call and schedule a FLU-CLINIC appointment**  After your visit with [place] today you will receive a survey in the mail or online from [employer] regarding your care with [place]. Please take a moment to fill this out. Your feedback is very  important to [place] as you can help [place] better understand your patient needs as well as improve your experience and satisfaction. WE CARE ABOUT YOU!!!   Continue the coated baby aspirin once daily after eating Please check with the vascepa and if it cost too much take JR [place] Fish oil 2 g twice daily Continue with current medicine Follow-up with hematology as planned Follow-up with urology as planned Follow-up with ear nose and throat specialist as planned

## 2017-11-09 ENCOUNTER — Inpatient Hospital Stay: Payer: Medicare Other

## 2017-11-09 ENCOUNTER — Inpatient Hospital Stay: Payer: Medicare Other | Attending: Hematology & Oncology | Admitting: Family

## 2017-11-09 ENCOUNTER — Other Ambulatory Visit: Payer: Self-pay

## 2017-11-09 VITALS — BP 139/65 | HR 73 | Temp 98.2°F | Resp 20 | Wt 236.0 lb

## 2017-11-09 DIAGNOSIS — D751 Secondary polycythemia: Secondary | ICD-10-CM

## 2017-11-09 DIAGNOSIS — D500 Iron deficiency anemia secondary to blood loss (chronic): Secondary | ICD-10-CM | POA: Diagnosis present

## 2017-11-09 DIAGNOSIS — Z7982 Long term (current) use of aspirin: Secondary | ICD-10-CM | POA: Diagnosis not present

## 2017-11-09 DIAGNOSIS — K909 Intestinal malabsorption, unspecified: Secondary | ICD-10-CM

## 2017-11-09 LAB — CMP (CANCER CENTER ONLY)
ALT: 21 U/L (ref 0–44)
AST: 26 U/L (ref 15–41)
Albumin: 4.3 g/dL (ref 3.5–5.0)
Alkaline Phosphatase: 62 U/L (ref 38–126)
Anion gap: 8 (ref 5–15)
BILIRUBIN TOTAL: 0.6 mg/dL (ref 0.3–1.2)
BUN: 21 mg/dL (ref 8–23)
CHLORIDE: 109 mmol/L (ref 98–111)
CO2: 26 mmol/L (ref 22–32)
Calcium: 9.7 mg/dL (ref 8.9–10.3)
Creatinine: 1.01 mg/dL (ref 0.61–1.24)
GFR, Est AFR Am: >60 mL/min (ref >60)
Glucose, Bld: 108 mg/dL — ABNORMAL HIGH (ref 70–99)
Potassium: 4 mmol/L (ref 3.5–5.1)
Sodium: 143 mmol/L (ref 135–145)
Total Protein: 6.8 g/dL (ref 6.5–8.1)

## 2017-11-09 LAB — CBC WITH DIFFERENTIAL (CANCER CENTER ONLY)
Basophils Absolute: 0.1 10*3/uL (ref 0.0–0.1)
Basophils Relative: 1 %
Eosinophils Absolute: 0.2 10*3/uL (ref 0.0–0.5)
Eosinophils Relative: 4 %
HEMATOCRIT: 45.9 % (ref 38.7–49.9)
Hemoglobin: 15 g/dL (ref 13.0–17.1)
Lymphocytes Relative: 25 %
Lymphs Abs: 1.3 10*3/uL (ref 0.9–3.3)
MCH: 28.2 pg (ref 28.0–33.4)
MCHC: 32.7 g/dL (ref 32.0–35.9)
MCV: 86.3 fL (ref 82.0–98.0)
Monocytes Absolute: 0.5 10*3/uL (ref 0.1–0.9)
Monocytes Relative: 11 %
Neutro Abs: 3 10*3/uL (ref 1.5–6.5)
Neutrophils Relative %: 59 %
PLATELETS: 122 10*3/uL — AB (ref 145–400)
RBC: 5.32 MIL/uL (ref 4.20–5.70)
RDW: 14.9 % (ref 11.1–15.7)
WBC Count: 5.1 10*3/uL (ref 4.0–10.0)

## 2017-11-09 NOTE — Progress Notes (Signed)
Hematology and Oncology Follow Up Visit  Connor [name] [id number] 12-17-1945 72 y.o. 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 He is feeling fatigued and napping as needed at home. Hct today is 45.9%. He is very concerned with iron deficiency that occurs with phlebotomy. He last donated with [facility] in June and is able to donate again on August 20th. Iron studies for today are pending.  He has had no episodes of bleeding, no bruising or petechiae.  No fever, chills, n/v, cough, rash, dizziness, SOB, chest pain, palpitations, abdominal pain or changes in bowel or bladder habits.  No swelling in his extremities. He still has intermittent numbness and tingling in the right thigh. Work up with [place] [facility] so far has been negative.  He has maintained a good appetite and is making sure to stay well hydrated. He is taking his baby aspirin daily as prescribed. His weight is stable.  He wants to feel well for their church trip this weekend to [place]. I told him to make sure to get up and walk on the bus once every hour or so to keep his blood moving.   ECOG Performance Status: 1 - Symptomatic but completely ambulatory  Medications:  Allergies as of 11/09/2017      Reactions   Dexlansoprazole Nausea And Vomiting      Medication List        Accurate as of 11/09/17 11:17 AM. Always use your most recent med list.          alfuzosin 10 MG 24 hr tablet Commonly known as:  UROXATRAL Take 10 mg by mouth every morning.   aspirin 81 MG tablet Take 81 mg by mouth 2 (two) times daily.   CENTRUM SILVER ULTRA MENS Tabs Take 1 tablet by  mouth every morning. Once a day   cetirizine 10 MG tablet Commonly known as:  ZYRTEC Take 10 mg by mouth daily.   CO Q 10 PO Take 1 capsule by mouth daily.   cyclobenzaprine 10 MG tablet Commonly known as:  FLEXERIL Take 1 tablet (10 mg total) by mouth 3 (three) times daily as needed for muscle spasms.   docusate sodium 100 MG capsule Commonly known as:  COLACE Take 200 mg by mouth daily as needed for mild constipation.   fish oil-omega-3 fatty acids 1000 MG capsule Take 2 g by mouth daily.   GLUCOSAMINE CHONDR 1500 COMPLX Caps Take by mouth every morning.   Icosapent Ethyl 1 g Caps Commonly known as:  VASCEPA Take 2 capsules (2 g total) by mouth 2 (two) times daily.   Ipratropium-Albuterol 20-100 MCG/ACT Aers respimat Commonly known as:  COMBIVENT Inhale 1 puff into the lungs as needed for wheezing or shortness of breath.   losartan 50 MG tablet Commonly known as:  COZAAR Take 1 tablet (50 mg total) by mouth daily.   montelukast 10 MG tablet Commonly known as:  SINGULAIR TAKE 1 TABLET AT BEDTIME   PHILLIPS COLON HEALTH PO Take 1 capsule by mouth daily.   polyethylene glycol packet Commonly known as:  MIRALAX / GLYCOLAX Take 17 g by mouth as needed.   pyridOXINE 100 MG  tablet Commonly known as:  VITAMIN B-6 Take 100 mg by mouth daily.   rosuvastatin 10 MG tablet Commonly known as:  CRESTOR TAKE 1 TABLET (10 MG TOTAL) BY MOUTH DAILY.   sildenafil 20 MG tablet Commonly known as:  REVATIO Take 2-5 tabs prior to sexual activity   Turmeric 500 MG Caps Take by mouth every morning.   vitamin B-12 500 MCG tablet Commonly known as:  CYANOCOBALAMIN Take 500 mcg by mouth daily.   Vitamin D-3 5000 units Tabs Take 1 tablet by mouth daily.       Allergies:  Allergies  Allergen Reactions  . Dexlansoprazole Nausea And Vomiting    Past Medical History, Surgical history, Social history, and Family History were reviewed and updated.  Review of Systems: All  other 10 point review of systems is negative.   Physical Exam:  weight is 236 lb (107 kg). His oral temperature is 98.2 F (36.8 C). His blood pressure is 139/65 and his pulse is 73. His respiration is 20 and oxygen saturation is 97%.   Wt Readings from Last 3 Encounters:  11/09/17 236 lb (107 kg)  11/08/17 231 lb (104.8 kg)  09/28/17 233 lb 12 oz (106 kg)    Ocular: Sclerae unicteric, pupils equal, round and reactive to light Ear-nose-throat: Oropharynx clear, dentition fair Lymphatic: No cervical, supraclavicular or axillary adenopathy Lungs no rales or rhonchi, good excursion bilaterally Heart regular rate and rhythm, no murmur appreciated Abd soft, nontender, positive bowel sounds, no liver or spleen tip palpated on exam, no fluid wave  MSK no focal spinal tenderness, no joint edema Neuro: non-focal, well-oriented, appropriate affect Breasts:  Deferred   Lab Results  Component Value Date   WBC 5.1 11/09/2017   HGB 15.0 11/09/2017   HCT 45.9 11/09/2017   MCV 86.3 11/09/2017   PLT 122 (L) 11/09/2017   Lab Results  Component Value Date   FERRITIN 15 (L) 09/28/2017   IRON 129 09/28/2017   TIBC 311 09/28/2017   UIBC 183 09/28/2017   IRONPCTSAT 41 (L) 09/28/2017   Lab Results  Component Value Date   RETICCTPCT 0.8 09/06/2013   RBC 5.32 11/09/2017   RETICCTABS 40.2 09/06/2013   No results found for: KPAFRELGTCHN, LAMBDASER, KAPLAMBRATIO No results found for: IGGSERUM, IGA, IGMSERUM No results found for: Odetta Pink, SPEI   Chemistry      Component Value Date/Time   NA 144 11/07/2017 1426   NA 139 04/13/2017 1150   NA 141 07/09/2016 1038   K 4.3 11/07/2017 1426   K 4.0 04/13/2017 1150   K 4.0 07/09/2016 1038   CL 107 (H) 11/07/2017 1426   CL 104 04/13/2017 1150   CO2 24 11/07/2017 1426   CO2 26 04/13/2017 1150   CO2 22 07/09/2016 1038   BUN 19 11/07/2017 1426   BUN 17 04/13/2017 1150   BUN 19.0  07/09/2016 1038   CREATININE 0.95 11/07/2017 1426   CREATININE 0.90 09/28/2017 1102   CREATININE 1.0 04/13/2017 1150   CREATININE 0.9 07/09/2016 1038      Component Value Date/Time   CALCIUM 8.8 11/07/2017 1426   CALCIUM 9.4 04/13/2017 1150   CALCIUM 9.5 07/09/2016 1038   ALKPHOS 63 11/07/2017 1426   ALKPHOS 64 04/13/2017 1150   ALKPHOS 85 07/09/2016 1038   AST 19 11/07/2017 1426   AST 27 09/28/2017 1102   AST 22 07/09/2016 1038   ALT 17 11/07/2017 1426   ALT 26 09/28/2017 1102  ALT 26 04/13/2017 1150   ALT 22 07/09/2016 1038   BILITOT 0.5 11/07/2017 1426   BILITOT 1.0 09/28/2017 1102   BILITOT 0.62 07/09/2016 1038      Impression and Plan: Connor [name] is a very pleasant 72 yo caucasian gentleman with secondary polycythemia. He has had a nice response and his Hct today is 45.9%. I spoke with [name] and we will have him continue to donate with the [facility] every 56 days. Next date is August 20th.  We will see what his iron studies show.  We will plan to see him back in another month for follow-up.  They will contact our office with any questions or concerns. We can certainly see him sooner if need be.   Laverna Peace, NP 7/31/201911:17 AM

## 2017-11-10 LAB — IRON AND TIBC
Iron: 95 ug/dL (ref 42–163)
Saturation Ratios: 25 % — ABNORMAL LOW (ref 42–163)
TIBC: 389 ug/dL (ref 202–409)
UIBC: 293 ug/dL

## 2017-11-10 LAB — FERRITIN: Ferritin: 9 ng/mL — ABNORMAL LOW (ref 24–336)

## 2017-11-25 ENCOUNTER — Telehealth: Payer: Self-pay | Admitting: Pulmonary Disease

## 2017-11-25 NOTE — Telephone Encounter (Signed)
Pt is calling back [phone]

## 2017-11-25 NOTE — Telephone Encounter (Signed)
I called pt but there was no answer. I wanted to clarify what sleep study he is requesting, I only see one from 2014. LM to call back.

## 2017-11-25 NOTE — Telephone Encounter (Signed)
Spoke with pt. He is requesting a copy of his sleep study from 2006. This has been printed and placed in the outgoing mail. Nothing further was needed.

## 2017-11-29 ENCOUNTER — Ambulatory Visit (INDEPENDENT_AMBULATORY_CARE_PROVIDER_SITE_OTHER): Payer: Medicare Other

## 2017-11-29 ENCOUNTER — Telehealth: Payer: Self-pay | Admitting: Family Medicine

## 2017-11-29 ENCOUNTER — Ambulatory Visit (INDEPENDENT_AMBULATORY_CARE_PROVIDER_SITE_OTHER): Payer: Medicare Other | Admitting: Family Medicine

## 2017-11-29 ENCOUNTER — Encounter: Payer: Self-pay | Admitting: Family Medicine

## 2017-11-29 VITALS — BP 117/64 | HR 78 | Temp 97.1°F | Ht 71.5 in | Wt 230.2 lb

## 2017-11-29 DIAGNOSIS — R109 Unspecified abdominal pain: Secondary | ICD-10-CM | POA: Diagnosis not present

## 2017-11-29 DIAGNOSIS — R1030 Lower abdominal pain, unspecified: Secondary | ICD-10-CM

## 2017-11-29 MED ORDER — METRONIDAZOLE 500 MG PO TABS: 500.0000 mg | ORAL_TABLET | Freq: Three times a day (TID) | ORAL | 0 refills | Status: DC

## 2017-11-29 MED ORDER — CIPROFLOXACIN HCL 500 MG PO TABS: 500.0000 mg | ORAL_TABLET | Freq: Two times a day (BID) | ORAL | 0 refills | Status: DC

## 2017-11-29 MED ORDER — ONDANSETRON 8 MG PO TBDP: 8.0000 mg | ORAL_TABLET | Freq: Three times a day (TID) | ORAL | 0 refills | Status: DC | PRN

## 2017-11-29 NOTE — Telephone Encounter (Signed)
Aware. Avoide seeded items and increase fiber.

## 2017-11-29 NOTE — Progress Notes (Signed)
Subjective:  Patient ID: Connor [name], male    DOB: 1945-06-14  Age: 72 y.o. MRN: [id number]  CC: Abdominal Pain (pt here today c/o lower abdominal pain x 6 days where he feels like he has urgency for BM like if you had diarrhea but don't. Pt denies urinary symptoms)   HPI Connor [name] presents for increasing pain for the last 6 days. Having 3-5 loose BMS a day. Not watery. Pain is moderate at [facility] and suprapubic, but no dysuria. It is a pressure and ache, but not cramping.  Had some nausea 3 days ago, but none since. Appetite is decreased.  Depression screen [facility] 2/9 11/08/2017 07/01/2017 03/31/2017  Decreased Interest 1 2 0  Down, Depressed, Hopeless 0 2 0  PHQ - 2 Score 1 4 0  Altered sleeping - 2 -  Tired, decreased energy - 2 -  Change in appetite - 1 -  Feeling bad or failure about yourself  - 0 -  Trouble concentrating - 1 -  Moving slowly or fidgety/restless - 0 -  Suicidal thoughts - 0 -  PHQ-9 Score - 10 -  Difficult doing work/chores - Somewhat difficult -  Some recent data might be hidden    History Connor [name] has a past medical history of Anemia, BPH (benign prostatic hyperplasia), Cancer ([place]), Cardiomegaly, Carotid artery stenosis, COPD, mild ([place]) (05/26/2007), Cough (05/26/2007), Erectile dysfunction, GERD (gastroesophageal reflux disease), H/O vitamin D deficiency, History of nuclear stress test, echocardiogram (02/15/2012), Hyperlipemia, Iron deficiency anemia, unspecified (09/01/2012), 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   He has a past surgical history that includes Appendectomy; Knee surgery; Shoulder surgery; Nose surgery (2013); Laparoscopic Nissen fundoplication (6/38/4665); Hiatal hernia repair (11/24/2011); and Eye surgery (Bilateral).   His family history includes Arthritis in his daughter and sister; Diabetes in his brother and  mother; GI problems in his daughter; Heart attack in his brother; Heart disease in his father, mother, paternal grandmother, and sister; Hemachromatosis in his daughter; Hypertension in his brother and daughter; Liver cancer in his paternal grandfather; Migraines in his daughter; Throat cancer in his maternal grandfather; Thyroid disease in his daughter and daughter.He reports that he quit smoking about 51 years ago. His smoking use included cigarettes. He started smoking about 59 years ago. He has a 2.75 pack-year smoking history. He has never used smokeless tobacco. He reports that he drinks alcohol. He reports that he does not use drugs.    ROS Review of Systems  Constitutional: Negative for chills, diaphoresis, fever and unexpected weight change.  HENT: Negative for rhinorrhea and trouble swallowing.   Respiratory: Negative for cough, chest tightness and shortness of breath.   Cardiovascular: Negative for chest pain.  Gastrointestinal: Positive for abdominal pain, diarrhea and nausea. Negative for abdominal distention, blood in stool, constipation, rectal pain and vomiting.  Genitourinary: Negative for dysuria, flank pain and hematuria.  Musculoskeletal: Negative for arthralgias and joint swelling.  Skin: Negative for rash.  Neurological: Negative for syncope and headaches.    Objective:  BP 117/64   Pulse 78   Temp (!) 97.1 F (36.2 C) (Oral)   Ht 5' 11.5" (1.816 m)   Wt 230 lb 4 oz (104.4 kg)   BMI 31.67 kg/m   BP Readings from Last 3 Encounters:  11/29/17 117/64  11/09/17 139/65  11/08/17 118/62    Wt Readings from Last 3 Encounters:  11/29/17 230 lb 4 oz (104.4 kg)  11/09/17  236 lb (107 kg)  11/08/17 231 lb (104.8 kg)     Physical Exam  Constitutional: He appears well-developed and well-nourished.  HENT:  Head: Normocephalic and atraumatic.  Right Ear: Tympanic membrane and external ear normal. No decreased hearing is noted.  Left Ear: Tympanic membrane and  external ear normal. No decreased hearing is noted.  Mouth/Throat: No oropharyngeal exudate or posterior oropharyngeal erythema.  Eyes: Pupils are equal, round, and reactive to light.  Neck: Normal range of motion. Neck supple.  Cardiovascular: Normal rate and regular rhythm.  No murmur heard. Pulmonary/Chest: Breath sounds normal. No respiratory distress.  Abdominal: Soft. Bowel sounds are normal. He exhibits no distension, no abdominal bruit, no ascites and no mass. There is no hepatosplenomegaly. There is tenderness in the right lower quadrant, suprapubic area and left lower quadrant. There is no rebound, no guarding, no CVA tenderness, no tenderness at McBurney's point and negative Murphy's sign.  Vitals reviewed.  Abd XR - nml bowel gas. No acute abn.   Assessment & Plan:   Connor [name] was seen today for abdominal pain.  Diagnoses and all orders for this visit:  Lower abdominal pain -     DG Abd 2 Views; Future -     CBC with Differential/Platelet -     CMP14+EGFR  Other orders -     metroNIDAZOLE (FLAGYL) 500 MG tablet; Take 1 tablet (500 mg total) by mouth 3 (three) times daily. -     ciprofloxacin (CIPRO) 500 MG tablet; Take 1 tablet (500 mg total) by mouth 2 (two) times daily. -     ondansetron (ZOFRAN-ODT) 8 MG disintegrating tablet; Take 1 tablet (8 mg total) by mouth every 8 (eight) hours as needed for nausea or vomiting.       I am having Connor [name] start on metroNIDAZOLE, ciprofloxacin, and ondansetron. I am also having him maintain his aspirin, fish oil-omega-3 fatty acids, alfuzosin, Vitamin D-3, Coenzyme Q10 (CO Q 10 PO), CENTRUM SILVER ULTRA MENS, pyridOXINE, GLUCOSAMINE CHONDR 1500 COMPLX, polyethylene glycol, Probiotic Product (PHILLIPS COLON HEALTH PO), Turmeric, vitamin B-12, sildenafil, Ipratropium-Albuterol, docusate sodium, cetirizine, montelukast, losartan, rosuvastatin, cyclobenzaprine, and Icosapent Ethyl.  Allergies as of 11/29/2017      Reactions    Dexlansoprazole Nausea And Vomiting      Medication List        Accurate as of 11/29/17 10:00 PM. Always use your most recent med list.          alfuzosin 10 MG 24 hr tablet Commonly known as:  UROXATRAL Take 10 mg by mouth every morning.   aspirin 81 MG tablet Take 81 mg by mouth 2 (two) times daily.   CENTRUM SILVER ULTRA MENS Tabs Take 1 tablet by mouth every morning. Once a day   cetirizine 10 MG tablet Commonly known as:  ZYRTEC Take 10 mg by mouth daily.   ciprofloxacin 500 MG tablet Commonly known as:  CIPRO Take 1 tablet (500 mg total) by mouth 2 (two) times daily.   CO Q 10 PO Take 1 capsule by mouth daily.   cyclobenzaprine 10 MG tablet Commonly known as:  FLEXERIL Take 1 tablet (10 mg total) by mouth 3 (three) times daily as needed for muscle spasms.   docusate sodium 100 MG capsule Commonly known as:  COLACE Take 200 mg by mouth daily as needed for mild constipation.   fish oil-omega-3 fatty acids 1000 MG capsule Take 2 g by mouth daily.   GLUCOSAMINE CHONDR [place] Take by  mouth every morning.   Icosapent Ethyl 1 g Caps Take 2 capsules (2 g total) by mouth 2 (two) times daily.   Ipratropium-Albuterol 20-100 MCG/ACT Aers respimat Commonly known as:  COMBIVENT Inhale 1 puff into the lungs as needed for wheezing or shortness of breath.   losartan 50 MG tablet Commonly known as:  COZAAR Take 1 tablet (50 mg total) by mouth daily.   metroNIDAZOLE 500 MG tablet Commonly known as:  FLAGYL Take 1 tablet (500 mg total) by mouth 3 (three) times daily.   montelukast 10 MG tablet Commonly known as:  SINGULAIR TAKE 1 TABLET AT BEDTIME   ondansetron 8 MG disintegrating tablet Commonly known as:  ZOFRAN-ODT Take 1 tablet (8 mg total) by mouth every 8 (eight) hours as needed for nausea or vomiting.   PHILLIPS COLON HEALTH PO Take 1 capsule by mouth daily.   polyethylene glycol packet Commonly known as:  MIRALAX / GLYCOLAX Take 17 g by  mouth as needed.   pyridOXINE 100 MG tablet Commonly known as:  VITAMIN B-6 Take 100 mg by mouth daily.   rosuvastatin 10 MG tablet Commonly known as:  CRESTOR TAKE 1 TABLET (10 MG TOTAL) BY MOUTH DAILY.   sildenafil 20 MG tablet Commonly known as:  REVATIO Take 2-5 tabs prior to sexual activity   Turmeric 500 MG Caps Take by mouth every morning.   vitamin B-12 500 MCG tablet Commonly known as:  CYANOCOBALAMIN Take 500 mcg by mouth daily.   Vitamin D-3 5000 units Tabs Take 1 tablet by mouth daily.        Follow-up: Return if symptoms worsen or fail to improve.  Claretta Fraise, [name]

## 2017-11-30 LAB — CBC WITH DIFFERENTIAL/PLATELET
BASOS: 0 %
Basophils Absolute: 0 10*3/uL (ref 0.0–0.2)
EOS (ABSOLUTE): 0.2 10*3/uL (ref 0.0–0.4)
Eos: 3 %
HEMOGLOBIN: 16 g/dL (ref 13.0–17.7)
Hematocrit: 49 % (ref 37.5–51.0)
IMMATURE GRANS (ABS): 0 10*3/uL (ref 0.0–0.1)
Immature Granulocytes: 0 %
LYMPHS: 19 %
Lymphocytes Absolute: 1.3 10*3/uL (ref 0.7–3.1)
MCH: 28.5 pg (ref 26.6–33.0)
MCHC: 32.7 g/dL (ref 31.5–35.7)
MCV: 87 fL (ref 79–97)
MONOCYTES: 7 %
Monocytes Absolute: 0.5 10*3/uL (ref 0.1–0.9)
NEUTROS ABS: 4.7 10*3/uL (ref 1.4–7.0)
Neutrophils: 71 %
Platelets: 136 10*3/uL — ABNORMAL LOW (ref 150–450)
RBC: 5.62 x10E6/uL (ref 4.14–5.80)
RDW: 14.7 % (ref 12.3–15.4)
WBC: 6.7 10*3/uL (ref 3.4–10.8)

## 2017-11-30 LAB — CMP14+EGFR
A/G RATIO: 2.3 — AB (ref 1.2–2.2)
ALBUMIN: 4.2 g/dL (ref 3.5–4.8)
ALT: 17 IU/L (ref 0–44)
AST: 18 IU/L (ref 0–40)
Alkaline Phosphatase: 83 IU/L (ref 39–117)
BUN / CREAT RATIO: 13 (ref 10–24)
BUN: 14 mg/dL (ref 8–27)
Bilirubin Total: 0.5 mg/dL (ref 0.0–1.2)
CO2: 22 mmol/L (ref 20–29)
Calcium: 9.2 mg/dL (ref 8.6–10.2)
Chloride: 104 mmol/L (ref 96–106)
Creatinine, Ser: 1.09 mg/dL (ref 0.76–1.27)
GFR calc non Af Amer: 67 mL/min/{1.73_m2} (ref >59)
GFR, EST AFRICAN AMERICAN: 77 mL/min/{1.73_m2} (ref >59)
Globulin, Total: 1.8 g/dL (ref 1.5–4.5)
Glucose: 112 mg/dL — ABNORMAL HIGH (ref 65–99)
POTASSIUM: 4.4 mmol/L (ref 3.5–5.2)
Sodium: 147 mmol/L — ABNORMAL HIGH (ref 134–144)
TOTAL PROTEIN: 6 g/dL (ref 6.0–8.5)

## 2017-12-01 ENCOUNTER — Telehealth: Payer: Self-pay | Admitting: Family Medicine

## 2017-12-01 NOTE — Telephone Encounter (Signed)
Called - doing some better. Can try immodium per [facility]

## 2017-12-07 ENCOUNTER — Ambulatory Visit: Payer: Medicare Other | Admitting: Family

## 2017-12-07 ENCOUNTER — Other Ambulatory Visit: Payer: Medicare Other

## 2017-12-08 ENCOUNTER — Telehealth: Payer: Self-pay | Admitting: *Deleted

## 2017-12-08 ENCOUNTER — Inpatient Hospital Stay: Payer: Medicare Other | Attending: Hematology & Oncology

## 2017-12-08 VITALS — BP 124/78 | HR 80 | Temp 98.1°F | Resp 20

## 2017-12-08 DIAGNOSIS — D751 Secondary polycythemia: Secondary | ICD-10-CM | POA: Insufficient documentation

## 2017-12-08 DIAGNOSIS — D45 Polycythemia vera: Secondary | ICD-10-CM

## 2017-12-08 NOTE — Patient Instructions (Signed)
Therapeutic Phlebotomy Therapeutic phlebotomy is the controlled removal of blood from a person's body for the purpose of treating a medical condition. The procedure is similar to donating blood. Usually, about a pint (470 mL, or 0.47L) of blood is removed. The average adult has 9-12 pints (4.3-5.7 L) of blood. Therapeutic phlebotomy may be used to treat the following medical conditions:  Hemochromatosis. This is a condition in which the blood contains too much iron.  Polycythemia vera. This is a condition in which the blood contains too many red blood cells.  Porphyria cutanea tarda. This is a disease in which an important part of hemoglobin is not made properly. It results in the buildup of abnormal amounts of porphyrins in the body.  Sickle cell disease. This is a condition in which the red blood cells form an abnormal crescent shape rather than a round shape.  Tell a health care provider about:  Any allergies you have.  All medicines you are taking, including vitamins, herbs, eye drops, creams, and over-the-counter medicines.  Any problems you or family members have had with anesthetic medicines.  Any blood disorders you have.  Any surgeries you have had.  Any medical conditions you have. What are the risks? Generally, this is a safe procedure. However, problems may occur, including:  Nausea or light-headedness.  Low blood pressure.  Soreness, bleeding, swelling, or bruising at the needle insertion site.  Infection.  What happens before the procedure?  Follow instructions from your health care provider about eating or drinking restrictions.  Ask your health care provider about changing or stopping your regular medicines. This is especially important if you are taking diabetes medicines or blood thinners.  Wear clothing with sleeves that can be raised above the elbow.  Plan to have someone take you home after the procedure.  You may have a blood sample taken. What  happens during the procedure?  A needle will be inserted into one of your veins.  Tubing and a collection bag will be attached to that needle.  Blood will flow through the needle and tubing into the collection bag.  You may be asked to open and close your hand slowly and continually during the entire collection.  After the specified amount of blood has been removed from your body, the collection bag and tubing will be clamped.  The needle will be removed from your vein.  Pressure will be held on the site of the needle insertion to stop the bleeding.  A bandage (dressing) will be placed over the needle insertion site. The procedure may vary among health care providers and hospitals. What happens after the procedure?  Your recovery will be assessed and monitored.  You can return to your normal activities as directed by your health care provider. This information is not intended to replace advice given to you by your health care provider. Make sure you discuss any questions you have with your health care provider. Document Released: 08/31/2010 Document Revised: 11/29/2015 Document Reviewed: 03/25/2014 Elsevier Interactive Patient Education  2018 [employer] Inc[employer]  

## 2017-12-08 NOTE — Telephone Encounter (Signed)
Patient had labs drawn with an outside physician and the HCT was 49. He would like to schedule a phlebotomy. He states he has not been to the [employer] to donate.  Reviewed with Dr Marin Olp. He is okay to schedule patient. Patient scheduled.

## 2017-12-08 NOTE — Progress Notes (Signed)
Connor [name] presents today for phlebotomy per MD orders. Phlebotomy procedure started at 1345 and ended at 1400. 512 grams removed via 16 g catheter by Thane Edu RN Patient observed for 30 minutes after procedure without any incident. Patient tolerated procedure well. IV needle removed intact.

## 2017-12-10 ENCOUNTER — Other Ambulatory Visit: Payer: Self-pay | Admitting: Family Medicine

## 2017-12-16 ENCOUNTER — Other Ambulatory Visit: Payer: Self-pay | Admitting: Family Medicine

## 2017-12-16 ENCOUNTER — Telehealth: Payer: Self-pay | Admitting: Family Medicine

## 2017-12-16 DIAGNOSIS — R110 Nausea: Secondary | ICD-10-CM

## 2017-12-16 DIAGNOSIS — R197 Diarrhea, unspecified: Secondary | ICD-10-CM

## 2017-12-16 DIAGNOSIS — R1084 Generalized abdominal pain: Secondary | ICD-10-CM

## 2017-12-16 NOTE — Telephone Encounter (Signed)
Is he having abdominal pain??? Did the diarrhea get better with the Cipro and metronidazole? When did he last seeing a gastroenterologist?

## 2017-12-16 NOTE — Telephone Encounter (Signed)
Last seen 8.20.19

## 2017-12-16 NOTE — Telephone Encounter (Signed)
Pain got better and diarrhea Has again Aware GI needed.  Referral placed

## 2017-12-17 ENCOUNTER — Telehealth: Payer: Self-pay | Admitting: Family Medicine

## 2017-12-17 NOTE — Telephone Encounter (Signed)
Patient aware that he can try boost on liquid diet

## 2017-12-27 ENCOUNTER — Encounter: Payer: Self-pay | Admitting: Physician Assistant

## 2017-12-27 ENCOUNTER — Other Ambulatory Visit: Payer: Medicare Other

## 2017-12-27 ENCOUNTER — Ambulatory Visit: Payer: Medicare Other | Admitting: Family

## 2017-12-27 ENCOUNTER — Ambulatory Visit (INDEPENDENT_AMBULATORY_CARE_PROVIDER_SITE_OTHER): Payer: Medicare Other | Admitting: Physician Assistant

## 2017-12-27 VITALS — BP 120/84 | HR 86 | Ht 71.5 in | Wt 232.0 lb

## 2017-12-27 DIAGNOSIS — R197 Diarrhea, unspecified: Secondary | ICD-10-CM

## 2017-12-27 DIAGNOSIS — R1084 Generalized abdominal pain: Secondary | ICD-10-CM | POA: Diagnosis not present

## 2017-12-27 MED ORDER — DICYCLOMINE HCL 20 MG PO TABS: 20.0000 mg | ORAL_TABLET | Freq: Four times a day (QID) | ORAL | 2 refills | Status: DC

## 2017-12-27 NOTE — Progress Notes (Addendum)
Chief Complaint: Abdominal pain, Diarrhea  HPI:    Connor [name] is a 72 year old Caucasian male who was referred to me by [name], MD for a complaint of abdominal pain and diarrhea.    Today, patient explains that initially late in the evening on August 14 he started with lower abdominal cramping and diarrhea.  He saw his primary care provider on 11/29/2017 who did an abdominal x-ray and diagnosed him with diverticulitis.  Patient tells me he was started on Flagyl 500 mg 3 times daily and Cipro 500 mg twice daily x10 days.  He tells me that once he finishes antibiotics he did feel somewhat better and things seemed to be getting back to normal but then he had a little bit of meatloaf at a fellowship supper at church on September 4 and started again with diarrhea.  Prior to this he had been maintaining a mostly liquid/soft diet.  He called his PCP who called in 5 more days of Flagyl and Cipro.  Patient tells me he only took this for 3 days and did feel a little bit better.  Again he had some coconut cake at church this past Sunday, 12/25/2017 and started again with loose stools.  Tells me that they look "aerated", describes at least 4-5 urgent stools a day accompanied by some lower abdominal discomfort which seems to sometimes concentrate in his right lower quadrant.  Patient has not been normal since all this started.  Denies any previous episodes of diverticulitis.    Social history positive for his wife having one episode of diverticulitis which perforated and she had to have surgery, so patient is extra aware/anxious regarding this diagnosis.    Denies fever, chills, blood in his stool, melena, weight loss, anorexia, nausea, vomiting, heartburn or reflux.  Past Medical History:  Diagnosis Date  . Anemia   . BPH (benign prostatic hyperplasia)   . Cancer (HCC)    squamous cell on nose , inside nose   . Cardiomegaly   . Carotid artery stenosis   . COPD, mild ([place]) 05/26/2007   PFT  09/09/11>>FEV1 3.06 (95%), FEV1% 69, FEF 25-75% 1.70 (59%), TLC 7.67 (109%), DLCO 108%, no BD    . Cough 05/26/2007  . Erectile dysfunction   . GERD (gastroesophageal reflux disease)   . H/O vitamin D deficiency   . History of nuclear stress test    this revealed normal myocardial perfusion and function. Post stress EF was 57%. There was no region of scar or ischemia.  [name] Hx of echocardiogram 02/15/2012   This showed mild concentric LVH. EF > 55%. H edid have grade 1 diastolic dysfunction with mitral valve E:A ratio of 0.81, his left atrium was mildly dilated by volume assessment at 33.3 mL/m2. He did have mild tricupid regurgitation with upper normal RV systolic pressure at 02HE. He had aortic valve sclerosis without stenosis.  . Hyperlipemia   . Iron deficiency anemia, unspecified 09/01/2012  . Iron malabsorption 05/28/2016  . Nephrolithiasis    stones   . Peyronie's disease   . Polycythemia vera(238.4) 04/19/2013  . Shortness of breath    with exertion   . Sleep apnea    Cpap-   . Thrombocytopenia ([place])     Past Surgical History:  Procedure Laterality Date  . APPENDECTOMY    . EYE SURGERY Bilateral    eye lid surgery  . HIATAL HERNIA REPAIR  11/24/2011   Procedure: LAPAROSCOPIC REPAIR OF HIATAL HERNIA;  Surgeon: Pedro Earls, MD;  Location: [facility];  Service: General;;  . KNEE SURGERY     right  . LAPAROSCOPIC NISSEN FUNDOPLICATION  9/52/8413   Procedure: LAPAROSCOPIC NISSEN FUNDOPLICATION;  Surgeon: Pedro Earls, MD;  Location: [facility];  Service: General;  Laterality: N/A;  . NOSE SURGERY  2013  . SHOULDER SURGERY     left    Current Outpatient Medications  Medication Sig Dispense Refill  . alfuzosin (UROXATRAL) 10 MG 24 hr tablet Take 10 mg by mouth every morning.     [name] aspirin 81 MG tablet Take 81 mg by mouth 2 (two) times daily.     . cetirizine (ZYRTEC) 10 MG tablet Take 10 mg by mouth daily.    . Cholecalciferol (VITAMIN D-3) 5000 UNITS TABS Take 1 tablet by mouth  daily.     . Coenzyme Q10 (CO Q 10 PO) Take 1 capsule by mouth daily.     . cyclobenzaprine (FLEXERIL) 10 MG tablet Take 1 tablet (10 mg total) by mouth 3 (three) times daily as needed for muscle spasms. 30 tablet 3  . docusate sodium (COLACE) 100 MG capsule Take 200 mg by mouth daily as needed for mild constipation.    . fish oil-omega-3 fatty acids 1000 MG capsule Take 2 g by mouth daily.     . Glucosamine-Chondroit-Vit C-Mn (GLUCOSAMINE CHONDR 1500 COMPLX) CAPS Take by mouth every morning.    Vanessa Kick Ethyl (VASCEPA) 1 g CAPS Take 2 capsules (2 g total) by mouth 2 (two) times daily. 120 capsule 3  . Ipratropium-Albuterol (COMBIVENT) 20-100 MCG/ACT AERS respimat Inhale 1 puff into the lungs as needed for wheezing or shortness of breath. 1 Inhaler 11  . losartan (COZAAR) 50 MG tablet Take 1 tablet (50 mg total) by mouth daily. 90 tablet 2  . montelukast (SINGULAIR) 10 MG tablet TAKE 1 TABLET AT BEDTIME 30 tablet 3  . Multiple Vitamins-Minerals (CENTRUM SILVER ULTRA MENS) TABS Take 1 tablet by mouth every morning. Once a day    . ondansetron (ZOFRAN-ODT) 8 MG disintegrating tablet TAKE ONE TABLET BY MOUTH EVERY 8 HOURS AS NEEDED FOR NAUSEA OR VOMITING 20 tablet 0  . polyethylene glycol (MIRALAX / GLYCOLAX) packet Take 17 g by mouth as needed.     . Probiotic Product (PHILLIPS COLON HEALTH PO) Take 1 capsule by mouth daily.     [name] pyridOXINE (VITAMIN B-6) 100 MG tablet Take 100 mg by mouth daily.     . rosuvastatin (CRESTOR) 10 MG tablet TAKE 1 TABLET (10 MG TOTAL) BY MOUTH DAILY. 90 tablet 0  . sildenafil (REVATIO) 20 MG tablet Take 2-5 tabs prior to sexual activity 50 tablet 1  . Turmeric 500 MG CAPS Take by mouth every morning.    . vitamin B-12 (CYANOCOBALAMIN) 500 MCG tablet Take 500 mcg by mouth daily.    [name] dicyclomine (BENTYL) 20 MG tablet Take 1 tablet (20 mg total) by mouth every 6 (six) hours. 30 tablet 2   No current facility-administered medications for this visit.     Allergies  as of 12/27/2017 - Review Complete 12/27/2017  Allergen Reaction Noted  . Dexlansoprazole Nausea And Vomiting 07/28/2011    Family History  Problem Relation Age of Onset  . Heart disease Mother   . Diabetes Mother   . Heart disease Father   . Heart disease Sister   . Arthritis Sister   . Diabetes Brother   . Hypertension Brother   . Heart attack Brother  enlarged heart  . Liver cancer Paternal Grandfather   . Throat cancer Maternal Grandfather   . Hemachromatosis Daughter   . Arthritis Daughter   . GI problems Daughter   . Thyroid disease Daughter   . Hypertension Daughter   . Heart disease Paternal Grandmother   . Thyroid disease Daughter        pituitary tumor  . Migraines Daughter   . Colon cancer Neg Hx   . Esophageal cancer Neg Hx   . Rectal cancer Neg Hx   . Stomach cancer Neg Hx     Social History   Socioeconomic History  . Marital status: Married    Spouse name: Not on file  . Number of children: 3  . Years of education: Not on file  . Highest education level: Not on file  Occupational History  . 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 Financial resource strain: Not on file  . Food insecurity:    Worry: Not on file    Inability: Not on file  . Transportation needs:    Medical: Not on file    Non-medical: Not on file  Tobacco Use  . Smoking status: Former Smoker    Packs/day: 0.25    Years: 11.00    Pack years: 2.75    Types: Cigarettes    Start date: 05/17/1958    Last attempt to quit: 04/12/1966    Years since quitting: 51.7  . Smokeless tobacco: Never Used  . Tobacco comment: quit smoking in 1968  Substance and Sexual Activity  . Alcohol use: Yes    Alcohol/week: 0.0 standard drinks    Comment: seldom  . Drug use: No  . Sexual activity: Not on file  Lifestyle  . Physical activity:    Days per week: Not on file    Minutes per session: Not on file  . Stress: Not on file  Relationships   . Social connections:    Talks on phone: Not on file    Gets together: Not on file    Attends religious service: Not on file    Active member of club or organization: Not on file    Attends meetings of clubs or organizations: Not on file    Relationship status: Not on file  . Intimate partner violence:    Fear of current or ex partner: Not on file    Emotionally abused: Not on file    Physically abused: Not on file    Forced sexual activity: Not on file  Other Topics Concern  . Not on file  Social History Narrative  . Not on file    Review of Systems:    Constitutional: No weight loss, fever or chills Skin: No rash Cardiovascular: No chest pain Respiratory: No SOB Gastrointestinal: See HPI and otherwise negative Genitourinary: No dysuria  Neurological: No headache, dizziness or syncope Musculoskeletal: No new muscle or joint pain Hematologic: No bleeding  Psychiatric: No history of depression or anxiety   Physical Exam:  Vital signs: BP 120/84   Pulse 86   Ht 5' 11.5" (1.816 m)   Wt 232 lb (105.2 kg)   SpO2 96%   BMI 31.91 kg/m   Constitutional:   Pleasant Caucasian male appears to be in NAD, Well developed, Well nourished, alert and cooperative Head:  Normocephalic and atraumatic. Eyes:   PEERL, EOMI. No icterus. Conjunctiva pink. Ears:  Normal auditory acuity. Neck:  Supple Throat:  Oral cavity and pharynx without inflammation, swelling or lesion.  Respiratory: Respirations even and unlabored. Lungs clear to auscultation bilaterally.   No wheezes, crackles, or rhonchi.  Cardiovascular: Normal S1, S2. No MRG. Regular rate and rhythm. No peripheral edema, cyanosis or pallor.  Gastrointestinal:  Soft, nondistended, mild generalized abdominal ttp, some worse in the right lower quadrant. No rebound or guarding. Normal bowel sounds. No appreciable masses or hepatomegaly. Rectal:  Not performed.  Msk:  Symmetrical without gross deformities. Without edema, no deformity or  joint abnormality.  Neurologic:  Alert and  oriented x4;  grossly normal neurologically.  Skin:   Dry and intact without significant lesions or rashes. Psychiatric: Demonstrates good judgement and reason without abnormal affect or behaviors.  RELEVANT LABS AND IMAGING: CBC    Component Value Date/Time   WBC 6.7 11/29/2017 1015   WBC 5.1 11/09/2017 1036   WBC 4.9 07/28/2012 0725   RBC 5.62 11/29/2017 1015   RBC 5.32 11/09/2017 1036   HGB 16.0 11/29/2017 1015   HGB 15.1 04/13/2017 1150   HCT 49.0 11/29/2017 1015   HCT 46.1 04/13/2017 1150   PLT 136 (L) 11/29/2017 1015   MCV 87 11/29/2017 1015   MCV 84 04/13/2017 1150   MCH 28.5 11/29/2017 1015   MCH 28.2 11/09/2017 1036   MCHC 32.7 11/29/2017 1015   MCHC 32.7 11/09/2017 1036   RDW 14.7 11/29/2017 1015   RDW 15.0 04/13/2017 1150   LYMPHSABS 1.3 11/29/2017 1015   LYMPHSABS 0.8 (L) 04/13/2017 1150   MONOABS 0.5 11/09/2017 1036   EOSABS 0.2 11/29/2017 1015   EOSABS 0.1 04/13/2017 1150   BASOSABS 0.0 11/29/2017 1015   BASOSABS 0.0 04/13/2017 1150    CMP     Component Value Date/Time   NA 147 (H) 11/29/2017 1015   NA 139 04/13/2017 1150   NA 141 07/09/2016 1038   K 4.4 11/29/2017 1015   K 4.0 04/13/2017 1150   K 4.0 07/09/2016 1038   CL 104 11/29/2017 1015   CL 104 04/13/2017 1150   CO2 22 11/29/2017 1015   CO2 26 04/13/2017 1150   CO2 22 07/09/2016 1038   GLUCOSE 112 (H) 11/29/2017 1015   GLUCOSE 108 (H) 11/09/2017 1036   GLUCOSE 135 (H) 04/13/2017 1150   BUN 14 11/29/2017 1015   BUN 17 04/13/2017 1150   BUN 19.0 07/09/2016 1038   CREATININE 1.09 11/29/2017 1015   CREATININE 1.01 11/09/2017 1036   CREATININE 1.0 04/13/2017 1150   CREATININE 0.9 07/09/2016 1038   CALCIUM 9.2 11/29/2017 1015   CALCIUM 9.4 04/13/2017 1150   CALCIUM 9.5 07/09/2016 1038   PROT 6.0 11/29/2017 1015   PROT 6.3 (L) 04/13/2017 1150   PROT 6.4 07/09/2016 1038   ALBUMIN 4.2 11/29/2017 1015   ALBUMIN 3.8 07/09/2016 1038   AST 18  11/29/2017 1015   AST 26 11/09/2017 1036   AST 22 07/09/2016 1038   ALT 17 11/29/2017 1015   ALT 21 11/09/2017 1036   ALT 26 04/13/2017 1150   ALT 22 07/09/2016 1038   ALKPHOS 83 11/29/2017 1015   ALKPHOS 64 04/13/2017 1150   ALKPHOS 85 07/09/2016 1038   BILITOT 0.5 11/29/2017 1015   BILITOT 0.6 11/09/2017 1036   BILITOT 0.62 07/09/2016 1038   GFRNONAA 67 11/29/2017 1015   GFRNONAA >60 11/09/2017 1036   GFRAA 77 11/29/2017 1015   GFRAA >60 11/09/2017 1036    Assessment: 1.  Generalized abdominal pain: Some worse in the right lower quadrant, some  better after 2 separate rounds of antibiotics; concern for colitis 2.  Diarrhea: For the past month, some better after being treated with Cipro and Flagyl for suspected diverticulitis seen on "x-ray", now generalized abdominal pain and returned diarrhea; concern for viral versus infectious cause of colitis  Plan: 1.  Ordered CT abdomen pelvis with contrast for further evaluation. 2.  Ordered stool studies to include a GI pathogen panel and O&P 3.  Prescribed Dicyclomine 20 mg every 4-6 hours as needed for abdominal cramping #30 with 1 refill 4.  Recommend the patient maintain a low fiber/low residue diet for now.  Provided him with a handout. 5.  Patient will follow in clinic per recommendations after imaging and labs above.  He was assigned to Dr. Hilarie Fredrickson today.  Ellouise Newer, PA-C [facility] 12/27/2017, 11:40 AM  Cc: [name], MD   Addendum: Reviewed and agree with initial management. Pt subsequently diagnosed with C diff and is on treatment with vancomycin Should also use Florastor 250 mg BID x 1 month He should let us know if not improving with vancomycin therapy and also if diarrhea recurs thereafter. Pyrtle, Lajuan Lines, MD

## 2017-12-27 NOTE — Patient Instructions (Signed)
We have sent the following medications to your pharmacy for you to pick up at your convenience: Dicyclomine 20 mg every 6-8 hours as needed  Your provider has requested that you go to the basement level for lab work before leaving today. Press "B" on the elevator. The lab is located at the first door on the left as you exit the elevator.  You have been scheduled for a CT scan of the abdomen and pelvis at [facility] ([address]---this is in the same building as [facility]).   You are scheduled on 12-28-17 at 9:30 am. You should arrive 15 minutes prior to your appointment time for registration. Please follow the written instructions below on the day of your exam:  WARNING: IF YOU ARE ALLERGIC TO IODINE/X-RAY DYE, PLEASE NOTIFY RADIOLOGY IMMEDIATELY AT [phone]! YOU WILL BE GIVEN A 13 HOUR PREMEDICATION PREP.  1) Do not eat  anything after 5:30 am (4 hours prior to your test) 2) You have been given 2 bottles of oral contrast to drink. The solution may taste better if refrigerated, but do NOT add ice or any other liquid to this solution. Shake well before drinking.    Drink 1 bottle of contrast @ 7:30 am (2 hours prior to your exam)  Drink 1 bottle of contrast @ 8:30 am (1 hour prior to your exam)  You may take any medications as prescribed with a small amount of water except for the following: Metformin, Glucophage, Glucovance, Avandamet, Riomet, Fortamet, Actoplus Met, Janumet, Glumetza or Metaglip. The above medications must be held the day of the exam AND 48 hours after the exam.  The purpose of you drinking the oral contrast is to aid in the visualization of your intestinal tract. The contrast solution may cause some diarrhea. Before your exam is started, you will be given a small amount of fluid to drink. Depending on your individual set of symptoms, you may also receive an intravenous injection of x-ray contrast/dye. Plan on being at [facility] for 30  minutes or longer, depending on the type of exam you are having performed.  This test typically takes 30-45 minutes to complete.  If you have any questions regarding your exam or if you need to reschedule, you may call the CT department at [phone] between the hours of 8:00 am and 5:00 pm, Monday-Friday.  ________________________________________________________________________    

## 2017-12-28 ENCOUNTER — Ambulatory Visit (INDEPENDENT_AMBULATORY_CARE_PROVIDER_SITE_OTHER)
Admission: RE | Admit: 2017-12-28 | Discharge: 2017-12-28 | Disposition: A | Discharge status: Home / Self Care | Payer: Medicare Other | Source: Ambulatory Visit | Attending: Physician Assistant | Admitting: Physician Assistant

## 2017-12-28 ENCOUNTER — Other Ambulatory Visit: Payer: Medicare Other

## 2017-12-28 DIAGNOSIS — R1084 Generalized abdominal pain: Secondary | ICD-10-CM

## 2017-12-28 DIAGNOSIS — K5730 Diverticulosis of large intestine without perforation or abscess without bleeding: Secondary | ICD-10-CM | POA: Diagnosis not present

## 2017-12-28 DIAGNOSIS — R197 Diarrhea, unspecified: Secondary | ICD-10-CM

## 2017-12-28 MED ORDER — IOPAMIDOL (ISOVUE-300) INJECTION 61%
100.0000 mL | Freq: Once | INTRAVENOUS | Status: AC | PRN
  Administered 2017-12-28: 100 mL via INTRAVENOUS

## 2017-12-29 ENCOUNTER — Telehealth: Payer: Self-pay | Admitting: Physician Assistant

## 2017-12-29 NOTE — Telephone Encounter (Signed)
Patient wife states pt is still having diarrhea and wants to know what to do since the CT results didn't seem to help. Patients wife also wanting to know is stool tests have came back yet.

## 2017-12-29 NOTE — Telephone Encounter (Signed)
Left message on machine to call back  

## 2017-12-30 LAB — OVA AND PARASITE EXAMINATION
CONCENTRATE RESULT:: NONE SEEN
SPECIMEN QUALITY:: ADEQUATE
TRICHROME RESULT:: NONE SEEN
VKL: 91120188

## 2017-12-30 LAB — GASTROINTESTINAL PATHOGEN PANEL PCR
C. difficile Tox A/B, PCR: DETECTED — AB
CAMPYLOBACTER, PCR: NOT DETECTED
Cryptosporidium, PCR: NOT DETECTED
E coli (ETEC) LT/ST PCR: NOT DETECTED
E coli (STEC) stx1/stx2, PCR: NOT DETECTED
E coli 0157, PCR: NOT DETECTED
Giardia lamblia, PCR: NOT DETECTED
Norovirus, PCR: NOT DETECTED
ROTAVIRUS, PCR: NOT DETECTED
SALMONELLA, PCR: NOT DETECTED
SHIGELLA, PCR: NOT DETECTED

## 2017-12-30 MED ORDER — VANCOMYCIN HCL 125 MG PO CAPS: 125.0000 mg | ORAL_CAPSULE | Freq: Four times a day (QID) | ORAL | 0 refills | Status: AC

## 2017-12-30 NOTE — Telephone Encounter (Signed)
Connor [name], Connor [name]  Timothy Lasso, RN        Please let the patient know he has CDiff. He needs Vancomycin 125 mg p.o. every 6 hours x10 days. Thanks-JLL    The pt has been advised and  medications sent to the pharmacy for pick up.  I did advise the pt to follow strict handwashing precautions.  Remain hydrated and call if symptoms do not improve.  The pt has been advised of the information and verbalized understanding.

## 2018-01-03 ENCOUNTER — Telehealth: Payer: Self-pay

## 2018-01-03 NOTE — Telephone Encounter (Signed)
Addendum: Reviewed and agree with initial management. Pt subsequently diagnosed with C diff and is on treatment with vancomycin Should also use Florastor 250 mg BID x 1 month He should let us know if not improving with vancomycin therapy and also if diarrhea recurs thereafter. Pyrtle, Lajuan Lines, MD

## 2018-01-03 NOTE — Telephone Encounter (Signed)
The pt's wife states she picked up florastor yesterday and will make sure the pt takes it twice daily for 1 month.

## 2018-01-12 ENCOUNTER — Other Ambulatory Visit: Payer: Self-pay | Admitting: *Deleted

## 2018-01-12 MED ORDER — MONTELUKAST SODIUM 10 MG PO TABS: 10.0000 mg | ORAL_TABLET | Freq: Every day | ORAL | 3 refills | Status: DC

## 2018-01-26 ENCOUNTER — Inpatient Hospital Stay: Payer: Medicare Other | Attending: Hematology & Oncology

## 2018-01-26 ENCOUNTER — Other Ambulatory Visit: Payer: Self-pay

## 2018-01-26 ENCOUNTER — Encounter: Payer: Self-pay | Admitting: Family

## 2018-01-26 ENCOUNTER — Inpatient Hospital Stay: Payer: Medicare Other

## 2018-01-26 ENCOUNTER — Inpatient Hospital Stay (HOSPITAL_BASED_OUTPATIENT_CLINIC_OR_DEPARTMENT_OTHER): Payer: Medicare Other | Admitting: Family

## 2018-01-26 VITALS — BP 143/70 | HR 65 | Temp 97.7°F | Resp 18 | Wt 235.0 lb

## 2018-01-26 DIAGNOSIS — E611 Iron deficiency: Secondary | ICD-10-CM | POA: Insufficient documentation

## 2018-01-26 DIAGNOSIS — D751 Secondary polycythemia: Secondary | ICD-10-CM | POA: Diagnosis not present

## 2018-01-26 DIAGNOSIS — J449 Chronic obstructive pulmonary disease, unspecified: Secondary | ICD-10-CM | POA: Insufficient documentation

## 2018-01-26 DIAGNOSIS — Z79899 Other long term (current) drug therapy: Secondary | ICD-10-CM | POA: Diagnosis not present

## 2018-01-26 DIAGNOSIS — D500 Iron deficiency anemia secondary to blood loss (chronic): Secondary | ICD-10-CM

## 2018-01-26 DIAGNOSIS — D501 Sideropenic dysphagia: Secondary | ICD-10-CM

## 2018-01-26 LAB — CBC WITH DIFFERENTIAL (CANCER CENTER ONLY)
ABS IMMATURE GRANULOCYTES: 0.01 10*3/uL (ref 0.00–0.07)
BASOS PCT: 1 %
Basophils Absolute: 0 10*3/uL (ref 0.0–0.1)
Eosinophils Absolute: 0.1 10*3/uL (ref 0.0–0.5)
Eosinophils Relative: 3 %
HEMATOCRIT: 45.7 % (ref 39.0–52.0)
HEMOGLOBIN: 14.2 g/dL (ref 13.0–17.0)
IMMATURE GRANULOCYTES: 0 %
LYMPHS ABS: 1 10*3/uL (ref 0.7–4.0)
LYMPHS PCT: 22 %
MCH: 27.6 pg (ref 26.0–34.0)
MCHC: 31.1 g/dL (ref 30.0–36.0)
MCV: 88.9 fL (ref 80.0–100.0)
MONOS PCT: 9 %
Monocytes Absolute: 0.4 10*3/uL (ref 0.1–1.0)
NEUTROS ABS: 3 10*3/uL (ref 1.7–7.7)
NEUTROS PCT: 65 %
PLATELETS: 114 10*3/uL — AB (ref 150–400)
RBC: 5.14 MIL/uL (ref 4.22–5.81)
RDW: 13.1 % (ref 11.5–15.5)
WBC Count: 4.5 10*3/uL (ref 4.0–10.5)
nRBC: 0 % (ref 0.0–0.2)

## 2018-01-26 LAB — CMP (CANCER CENTER ONLY)
ALK PHOS: 57 U/L (ref 26–84)
ALT: 25 U/L (ref 10–47)
ANION GAP: 5 (ref 5–15)
AST: 29 U/L (ref 11–38)
Albumin: 3.6 g/dL (ref 3.5–5.0)
BILIRUBIN TOTAL: 0.8 mg/dL (ref 0.2–1.6)
BUN: 16 mg/dL (ref 7–22)
CALCIUM: 9.6 mg/dL (ref 8.0–10.3)
CO2: 29 mmol/L (ref 18–33)
Chloride: 109 mmol/L — ABNORMAL HIGH (ref 98–108)
Creatinine: 1 mg/dL (ref 0.60–1.20)
GLUCOSE: 97 mg/dL (ref 73–118)
POTASSIUM: 4 mmol/L (ref 3.3–4.7)
Sodium: 143 mmol/L (ref 128–145)
TOTAL PROTEIN: 6.2 g/dL — AB (ref 6.4–8.1)

## 2018-01-26 NOTE — Progress Notes (Signed)
Hematology and Oncology Follow Up Visit  Connor [name] [id number] 1945-06-28 72 y.o. 01/26/2018   Principle Diagnosis:  Secondary polycythemia(COPD)- Triple negative Chronic immune thrombocytopenia vs medication Transient iron deficiency secondary to phlebotomies  Current Therapy:   Phlebotomy to maintain hematocrit below 45% Aspirin 81 mg by mouth daily IV iron as indicated - last dose given on 06/02/2016   Interim History:  Connor [name] is here today with his wife for follow-up. He had diverticulitis in August after we last saw him and developed D diff. He has been on antibiotics and is finally starting to feel better.  The diarrhea has subsided but he is still feeling fatigued.  Hct today is 45.7%.  No episodes of bleeding, no bruising or petechiae.  No fever, chills, n/v, cough, rash, dizziness, chest pain, palpitations, abdominal pain or changes in bowel or bladder habits.  He will occasionally have SOB with over exertion and will take a break to rest if needed. No swelling or tenderness in his extremities at this time. He has intermittent numbness and tingling in the right thigh that is unchanged.  No lymphadenopathy noted on exam.  He has maintained a good appetite and is staying well hydrated. His weight is stable.   ECOG Performance Status: 1 - Symptomatic but completely ambulatory  Medications:  Allergies as of 01/26/2018      Reactions   Dexlansoprazole Nausea And Vomiting      Medication List        Accurate as of 01/26/18 11:26 AM. Always use your most recent med list.          alfuzosin 10 MG 24 hr tablet Commonly known as:  UROXATRAL Take 10 mg by mouth every morning.   aspirin 81 MG tablet Take 81 mg by mouth 2 (two) times daily.   CENTRUM SILVER ULTRA MENS Tabs Take 1 tablet by mouth every morning. Once a day   cetirizine 10 MG tablet Commonly known as:  ZYRTEC Take 10 mg by mouth daily.   CO Q 10 PO Take 1 capsule by mouth daily.     cyclobenzaprine 10 MG tablet Commonly known as:  FLEXERIL Take 1 tablet (10 mg total) by mouth 3 (three) times daily as needed for muscle spasms.   dicyclomine 20 MG tablet Commonly known as:  BENTYL Take 1 tablet (20 mg total) by mouth every 6 (six) hours.   docusate sodium 100 MG capsule Commonly known as:  COLACE Take 200 mg by mouth daily as needed for mild constipation.   fish oil-omega-3 fatty acids 1000 MG capsule Take 2 g by mouth daily.   GLUCOSAMINE CHONDR 1500 COMPLX Caps Take by mouth every morning.   Icosapent Ethyl 1 g Caps Take 2 capsules (2 g total) by mouth 2 (two) times daily.   Ipratropium-Albuterol 20-100 MCG/ACT Aers respimat Commonly known as:  COMBIVENT Inhale 1 puff into the lungs as needed for wheezing or shortness of breath.   losartan 50 MG tablet Commonly known as:  COZAAR Take 1 tablet (50 mg total) by mouth daily.   montelukast 10 MG tablet Commonly known as:  SINGULAIR Take 1 tablet (10 mg total) by mouth at bedtime.   ondansetron 8 MG disintegrating tablet Commonly known as:  ZOFRAN-ODT TAKE ONE TABLET BY MOUTH EVERY 8 HOURS AS NEEDED FOR NAUSEA OR VOMITING   PHILLIPS COLON HEALTH PO Take 1 capsule by mouth daily.   polyethylene glycol packet Commonly known as:  MIRALAX / GLYCOLAX Take 17 g by mouth  as needed.   pyridOXINE 100 MG tablet Commonly known as:  VITAMIN B-6 Take 100 mg by mouth daily.   rosuvastatin 10 MG tablet Commonly known as:  CRESTOR TAKE 1 TABLET (10 MG TOTAL) BY MOUTH DAILY.   sildenafil 20 MG tablet Commonly known as:  REVATIO Take 2-5 tabs prior to sexual activity   Turmeric 500 MG Caps Take by mouth every morning.   vitamin B-12 500 MCG tablet Commonly known as:  CYANOCOBALAMIN Take 500 mcg by mouth daily.   Vitamin D-3 5000 units Tabs Take 1 tablet by mouth daily.       Allergies:  Allergies  Allergen Reactions  . Dexlansoprazole Nausea And Vomiting    Past Medical History, Surgical  history, Social history, and Family History were reviewed and updated.  Review of Systems: All other 10 point review of systems is negative.   Physical Exam:  weight is 235 lb (106.6 kg). His oral temperature is 97.7 F (36.5 C). His blood pressure is 143/70 (abnormal) and his pulse is 65. His respiration is 18 and oxygen saturation is 99%.   Wt Readings from Last 3 Encounters:  01/26/18 235 lb (106.6 kg)  12/27/17 232 lb (105.2 kg)  11/29/17 230 lb 4 oz (104.4 kg)    Ocular: Sclerae unicteric, pupils equal, round and reactive to light Ear-nose-throat: Oropharynx clear, dentition fair Lymphatic: No cervical, supraclavicular or axillary adenopathy Lungs no rales or rhonchi, good excursion bilaterally Heart regular rate and rhythm, no murmur appreciated Abd soft, nontender, positive bowel sounds, no liver or spleen tip palpated on exam, no fluid wave  MSK no focal spinal tenderness, no joint edema Neuro: non-focal, well-oriented, appropriate affect Breasts: Deferred   Lab Results  Component Value Date   WBC 4.5 01/26/2018   HGB 14.2 01/26/2018   HCT 45.7 01/26/2018   MCV 88.9 01/26/2018   PLT 114 (L) 01/26/2018   Lab Results  Component Value Date   FERRITIN 9 (L) 11/09/2017   IRON 95 11/09/2017   TIBC 389 11/09/2017   UIBC 293 11/09/2017   IRONPCTSAT 25 (L) 11/09/2017   Lab Results  Component Value Date   RETICCTPCT 0.8 09/06/2013   RBC 5.14 01/26/2018   RETICCTABS 40.2 09/06/2013   No results found for: KPAFRELGTCHN, LAMBDASER, KAPLAMBRATIO No results found for: Kandis Cocking, IGMSERUM No results found for: Kathrynn Ducking, MSPIKE, SPEI   Chemistry      Component Value Date/Time   NA 147 (H) 11/29/2017 1015   NA 139 04/13/2017 1150   NA 141 07/09/2016 1038   K 4.4 11/29/2017 1015   K 4.0 04/13/2017 1150   K 4.0 07/09/2016 1038   CL 104 11/29/2017 1015   CL 104 04/13/2017 1150   CO2 22 11/29/2017 1015   CO2 26  04/13/2017 1150   CO2 22 07/09/2016 1038   BUN 14 11/29/2017 1015   BUN 17 04/13/2017 1150   BUN 19.0 07/09/2016 1038   CREATININE 1.09 11/29/2017 1015   CREATININE 1.01 11/09/2017 1036   CREATININE 1.0 04/13/2017 1150   CREATININE 0.9 07/09/2016 1038      Component Value Date/Time   CALCIUM 9.2 11/29/2017 1015   CALCIUM 9.4 04/13/2017 1150   CALCIUM 9.5 07/09/2016 1038   ALKPHOS 83 11/29/2017 1015   ALKPHOS 64 04/13/2017 1150   ALKPHOS 85 07/09/2016 1038   AST 18 11/29/2017 1015   AST 26 11/09/2017 1036   AST 22 07/09/2016 1038   ALT 17 11/29/2017 1015  ALT 21 11/09/2017 1036   ALT 26 04/13/2017 1150   ALT 22 07/09/2016 1038   BILITOT 0.5 11/29/2017 1015   BILITOT 0.6 11/09/2017 1036   BILITOT 0.62 07/09/2016 1038       Impression and Plan: Mr. Slabach is a very pleasant 72 yo caucasian gentleman with secondary polycythemia (COPD). He is recuperating from diverticulitis and C diff and is still feeling fatigue.  He has chronic iron deficiency due to phlebotomies.  Hct is 45.7%. We will give him a break and hold off on phlebotomizing him today. We will plan to see him back in another month.  He will contact our office with any questions or concerns. We can certainly see him sooner if need be.   Laverna Peace, NP 10/17/201911:26 AM

## 2018-01-27 LAB — IRON AND TIBC
IRON: 84 ug/dL (ref 42–163)
Saturation Ratios: 23 % — ABNORMAL LOW (ref 42–163)
TIBC: 364 ug/dL (ref 202–409)
UIBC: 280 ug/dL

## 2018-01-27 LAB — FERRITIN

## 2018-01-31 ENCOUNTER — Ambulatory Visit (INDEPENDENT_AMBULATORY_CARE_PROVIDER_SITE_OTHER): Payer: Medicare Other

## 2018-01-31 DIAGNOSIS — Z23 Encounter for immunization: Secondary | ICD-10-CM

## 2018-02-24 ENCOUNTER — Telehealth: Payer: Self-pay | Admitting: Family

## 2018-02-24 ENCOUNTER — Inpatient Hospital Stay: Payer: Medicare Other | Attending: Hematology & Oncology | Admitting: Family

## 2018-02-24 ENCOUNTER — Inpatient Hospital Stay: Payer: Medicare Other

## 2018-02-24 ENCOUNTER — Other Ambulatory Visit: Payer: Self-pay

## 2018-02-24 ENCOUNTER — Encounter: Payer: Self-pay | Admitting: Family

## 2018-02-24 VITALS — BP 117/64 | HR 77 | Temp 97.9°F | Resp 16 | Wt 236.0 lb

## 2018-02-24 DIAGNOSIS — Z7982 Long term (current) use of aspirin: Secondary | ICD-10-CM | POA: Diagnosis not present

## 2018-02-24 DIAGNOSIS — D751 Secondary polycythemia: Secondary | ICD-10-CM | POA: Insufficient documentation

## 2018-02-24 DIAGNOSIS — J449 Chronic obstructive pulmonary disease, unspecified: Secondary | ICD-10-CM | POA: Diagnosis not present

## 2018-02-24 DIAGNOSIS — D500 Iron deficiency anemia secondary to blood loss (chronic): Secondary | ICD-10-CM

## 2018-02-24 DIAGNOSIS — Z79899 Other long term (current) drug therapy: Secondary | ICD-10-CM | POA: Diagnosis not present

## 2018-02-24 LAB — CMP (CANCER CENTER ONLY)
ALBUMIN: 4 g/dL (ref 3.5–5.0)
ALT: 22 U/L (ref 0–44)
AST: 29 U/L (ref 15–41)
Alkaline Phosphatase: 66 U/L (ref 38–126)
Anion gap: 7 (ref 5–15)
BUN: 12 mg/dL (ref 8–23)
CHLORIDE: 109 mmol/L (ref 98–111)
CO2: 26 mmol/L (ref 22–32)
Calcium: 9.3 mg/dL (ref 8.9–10.3)
Creatinine: 1.02 mg/dL (ref 0.61–1.24)
GFR, Est AFR Am: >60 mL/min (ref >60)
GFR, Estimated: >60 mL/min (ref >60)
GLUCOSE: 86 mg/dL (ref 70–99)
POTASSIUM: 4.2 mmol/L (ref 3.5–5.1)
Sodium: 142 mmol/L (ref 135–145)
Total Bilirubin: 0.7 mg/dL (ref 0.3–1.2)
Total Protein: 6.7 g/dL (ref 6.5–8.1)

## 2018-02-24 LAB — CBC WITH DIFFERENTIAL (CANCER CENTER ONLY)
ABS IMMATURE GRANULOCYTES: 0.02 10*3/uL (ref 0.00–0.07)
BASOS ABS: 0.1 10*3/uL (ref 0.0–0.1)
BASOS PCT: 1 %
EOS ABS: 0.1 10*3/uL (ref 0.0–0.5)
Eosinophils Relative: 2 %
HEMATOCRIT: 48.3 % (ref 39.0–52.0)
Hemoglobin: 15.1 g/dL (ref 13.0–17.0)
Immature Granulocytes: 0 %
LYMPHS ABS: 1 10*3/uL (ref 0.7–4.0)
Lymphocytes Relative: 20 %
MCH: 27.2 pg (ref 26.0–34.0)
MCHC: 31.3 g/dL (ref 30.0–36.0)
MCV: 86.9 fL (ref 80.0–100.0)
Monocytes Absolute: 0.4 10*3/uL (ref 0.1–1.0)
Monocytes Relative: 7 %
NEUTROS ABS: 3.4 10*3/uL (ref 1.7–7.7)
NEUTROS PCT: 70 %
PLATELETS: 130 10*3/uL — AB (ref 150–400)
RBC: 5.56 MIL/uL (ref 4.22–5.81)
RDW: 13 % (ref 11.5–15.5)
WBC Count: 5 10*3/uL (ref 4.0–10.5)
nRBC: 0 % (ref 0.0–0.2)

## 2018-02-24 NOTE — Progress Notes (Signed)
Hematology and Oncology Follow Up Visit  Connor [name] [id number] 1946/03/10 72 y.o. 02/24/2018   Principle Diagnosis:  Secondary polycythemia(COPD)- Triple negative Chronic immune thrombocytopenia vs medication Transient iron deficiency secondary to phlebotomies  Current Therapy:   Phlebotomy to maintain hematocrit below 45% Aspirin 81 mg by mouth daily IV iron as indicated - last dose given on 06/02/2016   Interim History: Connor [name] is here today for follow-up. He is having some fatigue and itching. No rash visible on the skin.  Hct today is 48.3%. He plans to donate with the [facility] next week. He enjoys doing this and knowing he is helping someone out.  No fever, chills, n/v, cough, dizziness, SOB, chest pain, palpitations, abdominal pain or changes in bowel or bladder habits.  No swelling in his extremities at this time. No change in the intermittent numbness and tingling in the right thigh.  No lymphadenopathy noted on exam.  He is eating well and staying hydrated. His weight is stable.   ECOG Performance Status: 1 - Symptomatic but completely ambulatory  Medications:  Allergies as of 02/24/2018      Reactions   Dexlansoprazole Nausea And Vomiting      Medication List        Accurate as of 02/24/18 11:01 AM. Always use your most recent med list.          alfuzosin 10 MG 24 hr tablet Commonly known as:  UROXATRAL Take 10 mg by mouth every morning.   aspirin 81 MG tablet Take 81 mg by mouth 2 (two) times daily.   CENTRUM SILVER ULTRA MENS Tabs Take 1 tablet by mouth every morning. Once a day   cetirizine 10 MG tablet Commonly known as:  ZYRTEC Take 10 mg by mouth daily.   CO Q 10 PO Take 1 capsule by mouth daily.   cyclobenzaprine 10 MG tablet Commonly known as:  FLEXERIL Take 1 tablet (10 mg total) by mouth 3 (three) times daily as needed for muscle spasms.   dicyclomine 20 MG tablet Commonly known as:  BENTYL Take 1 tablet (20 mg total) by  mouth every 6 (six) hours.   docusate sodium 100 MG capsule Commonly known as:  COLACE Take 200 mg by mouth daily as needed for mild constipation.   fish oil-omega-3 fatty acids 1000 MG capsule Take 2 g by mouth daily.   GLUCOSAMINE CHONDR 1500 COMPLX Caps Take by mouth every morning.   Icosapent Ethyl 1 g Caps Take 2 capsules (2 g total) by mouth 2 (two) times daily.   Ipratropium-Albuterol 20-100 MCG/ACT Aers respimat Commonly known as:  COMBIVENT Inhale 1 puff into the lungs as needed for wheezing or shortness of breath.   losartan 50 MG tablet Commonly known as:  COZAAR Take 1 tablet (50 mg total) by mouth daily.   montelukast 10 MG tablet Commonly known as:  SINGULAIR Take 1 tablet (10 mg total) by mouth at bedtime.   ondansetron 8 MG disintegrating tablet Commonly known as:  ZOFRAN-ODT TAKE ONE TABLET BY MOUTH EVERY 8 HOURS AS NEEDED FOR NAUSEA OR VOMITING   PHILLIPS COLON HEALTH PO Take 1 capsule by mouth daily.   polyethylene glycol packet Commonly known as:  MIRALAX / GLYCOLAX Take 17 g by mouth as needed.   pyridOXINE 100 MG tablet Commonly known as:  VITAMIN B-6 Take 100 mg by mouth daily.   rosuvastatin 10 MG tablet Commonly known as:  CRESTOR TAKE 1 TABLET (10 MG TOTAL) BY MOUTH DAILY.  sildenafil 20 MG tablet Commonly known as:  REVATIO Take 2-5 tabs prior to sexual activity   Turmeric 500 MG Caps Take by mouth every morning.   vitamin B-12 500 MCG tablet Commonly known as:  CYANOCOBALAMIN Take 500 mcg by mouth daily.   Vitamin D-3 125 MCG (5000 UT) Tabs Take 1 tablet by mouth daily.       Allergies:  Allergies  Allergen Reactions  . Dexlansoprazole Nausea And Vomiting    Past Medical History, Surgical history, Social history, and Family History were reviewed and updated.  Review of Systems: All other 10 point review of systems is negative.   Physical Exam:  vitals were not taken for this visit.   Wt Readings from Last 3  Encounters:  01/26/18 235 lb (106.6 kg)  12/27/17 232 lb (105.2 kg)  11/29/17 230 lb 4 oz (104.4 kg)    Ocular: Sclerae unicteric, pupils equal, round and reactive to light Ear-nose-throat: Oropharynx clear, dentition fair Lymphatic: No cervical, supraclavicular or axillary adenopathy Lungs no rales or rhonchi, good excursion bilaterally Heart regular rate and rhythm, no murmur appreciated Abd soft, nontender, positive bowel sounds, no liver or spleen tip palpated on exam, no fluid wave  MSK no focal spinal tenderness, no joint edema Neuro: non-focal, well-oriented, appropriate affect Breasts: Deferred   Lab Results  Component Value Date   WBC 4.5 01/26/2018   HGB 14.2 01/26/2018   HCT 45.7 01/26/2018   MCV 88.9 01/26/2018   PLT 114 (L) 01/26/2018   Lab Results  Component Value Date   FERRITIN <4 (L) 01/26/2018   IRON 84 01/26/2018   TIBC 364 01/26/2018   UIBC 280 01/26/2018   IRONPCTSAT 23 (L) 01/26/2018   Lab Results  Component Value Date   RETICCTPCT 0.8 09/06/2013   RBC 5.14 01/26/2018   RETICCTABS 40.2 09/06/2013   No results found for: KPAFRELGTCHN, LAMBDASER, KAPLAMBRATIO No results found for: Kandis Cocking, IGMSERUM No results found for: Odetta Pink, SPEI   Chemistry      Component Value Date/Time   NA 143 01/26/2018 1056   NA 147 (H) 11/29/2017 1015   NA 139 04/13/2017 1150   NA 141 07/09/2016 1038   K 4.0 01/26/2018 1056   K 4.0 04/13/2017 1150   K 4.0 07/09/2016 1038   CL 109 (H) 01/26/2018 1056   CL 104 04/13/2017 1150   CO2 29 01/26/2018 1056   CO2 26 04/13/2017 1150   CO2 22 07/09/2016 1038   BUN 16 01/26/2018 1056   BUN 14 11/29/2017 1015   BUN 17 04/13/2017 1150   BUN 19.0 07/09/2016 1038   CREATININE 1.00 01/26/2018 1056   CREATININE 1.0 04/13/2017 1150   CREATININE 0.9 07/09/2016 1038      Component Value Date/Time   CALCIUM 9.6 01/26/2018 1056   CALCIUM 9.4 04/13/2017 1150    CALCIUM 9.5 07/09/2016 1038   ALKPHOS 57 01/26/2018 1056   ALKPHOS 64 04/13/2017 1150   ALKPHOS 85 07/09/2016 1038   AST 29 01/26/2018 1056   AST 22 07/09/2016 1038   ALT 25 01/26/2018 1056   ALT 26 04/13/2017 1150   ALT 22 07/09/2016 1038   BILITOT 0.8 01/26/2018 1056   BILITOT 0.62 07/09/2016 1038       Impression and Plan: Connor [name] is a very pleasant 72 yo caucasian gentleman with secondary polycythemia (COPD). His Hct is 48.3% and will be donating with the [facility] next week.  He will continue taking  his baby aspirin daily.  We will plan to see him back in another 6 weeks for follow-up.  They will contact our office with any questions or concerns. We can certainly see her sooner if need be.   Laverna Peace, NP 11/15/201911:01 AM

## 2018-02-24 NOTE — Telephone Encounter (Signed)
Appointments scheduled letter/calendar printed/amiled to patient per 11/15 los

## 2018-02-27 LAB — IRON AND TIBC
IRON: 88 ug/dL (ref 42–163)
Saturation Ratios: 23 % (ref 20–55)
TIBC: 389 ug/dL (ref 202–409)
UIBC: 301 ug/dL (ref 117–376)

## 2018-02-27 LAB — FERRITIN: Ferritin: 5 ng/mL — ABNORMAL LOW (ref 24–336)

## 2018-03-10 ENCOUNTER — Other Ambulatory Visit: Payer: Self-pay | Admitting: Family Medicine

## 2018-03-14 ENCOUNTER — Encounter: Payer: Self-pay | Admitting: Family Medicine

## 2018-03-14 ENCOUNTER — Ambulatory Visit (INDEPENDENT_AMBULATORY_CARE_PROVIDER_SITE_OTHER): Payer: Medicare Other | Admitting: Family Medicine

## 2018-03-14 VITALS — BP 119/60 | HR 90 | Temp 98.1°F | Ht 71.5 in | Wt 238.0 lb

## 2018-03-14 DIAGNOSIS — D45 Polycythemia vera: Secondary | ICD-10-CM

## 2018-03-14 DIAGNOSIS — D696 Thrombocytopenia, unspecified: Secondary | ICD-10-CM

## 2018-03-14 DIAGNOSIS — E559 Vitamin D deficiency, unspecified: Secondary | ICD-10-CM

## 2018-03-14 DIAGNOSIS — N400 Enlarged prostate without lower urinary tract symptoms: Secondary | ICD-10-CM | POA: Diagnosis not present

## 2018-03-14 DIAGNOSIS — J449 Chronic obstructive pulmonary disease, unspecified: Secondary | ICD-10-CM | POA: Diagnosis not present

## 2018-03-14 DIAGNOSIS — I10 Essential (primary) hypertension: Secondary | ICD-10-CM

## 2018-03-14 DIAGNOSIS — R5383 Other fatigue: Secondary | ICD-10-CM

## 2018-03-14 DIAGNOSIS — E7800 Pure hypercholesterolemia, unspecified: Secondary | ICD-10-CM

## 2018-03-14 DIAGNOSIS — E349 Endocrine disorder, unspecified: Secondary | ICD-10-CM | POA: Diagnosis not present

## 2018-03-14 MED ORDER — ROSUVASTATIN CALCIUM 10 MG PO TABS: ORAL_TABLET | ORAL | 3 refills | Status: DC

## 2018-03-14 NOTE — Patient Instructions (Addendum)
Medicare Annual Wellness Visit  [facility] and the medical providers at [facility] strive to bring you the best medical care.  In doing so we not only want to address your current medical conditions and concerns but also to detect new conditions early and prevent illness, disease and health-related problems.    Medicare offers a yearly Wellness Visit which allows our clinical staff to assess your need for preventative services including immunizations, lifestyle education, counseling to decrease risk of preventable diseases and screening for fall risk and other medical concerns.    This visit is provided free of charge (no copay) for all Medicare recipients. The clinical pharmacists at [facility] have begun to conduct these Wellness Visits which will also include a thorough review of all your medications.    As you primary medical provider recommend that you make an appointment for your Annual Wellness Visit if you have not done so already this year.  You may set up this appointment before you leave today or you may call back ([phone]) and schedule an appointment.  Please make sure when you call that you mention that you are scheduling your Annual Wellness Visit with the clinical pharmacist so that the appointment may be made for the proper length of time.     Continue current medications. Continue good therapeutic lifestyle changes which include good diet and exercise. Fall precautions discussed with patient. If an FOBT was given today- please return it to our front desk. If you are over 63 years old - you may need Prevnar 67 or the adult Pneumonia vaccine.  **Flu shots are available--- please call and schedule a FLU-CLINIC appointment**  After your visit with [place] today you will receive a survey in the mail or online from [employer] regarding your care with [place]. Please take a moment to fill this out. Your feedback is very  important to [place] as you can help [place] better understand your patient needs as well as improve your experience and satisfaction. WE CARE ABOUT YOU!!!   Continue to follow-up with hematology and urology Make every effort to get more exercise and lose weight with diet and exercise. Drink more water Eat less carbs and sugar

## 2018-03-14 NOTE — Progress Notes (Signed)
Subjective:    Patient ID: Connor [name], male    DOB: 1946-01-20, 72 y.o.   MRN: [id number]  HPI Pt here for follow up and management of chronic medical problems which includes hypertension. He is taking medication regularly.  The patient comes in today with a main complaint of fatigue.  He is followed regularly by the urologist for his PSA and prostate exams.  He is due to get some labs today which were not done by his hematologist.  He also has had his flu shot already done.  His vital signs are stable.  He is requesting a refill on his Crestor.  The patient has a history of skin cancer anemia GERD sleep apnea hyperlipidemia and hypertension.  Patient also sees the hematologist every 2 to 3 months.  He has to have a phlebotomy if his hematocrit is 45 or greater.  He has a history of low testosterone levels but has not taken testosterone replacement for a good while.  He is complaining of fatigue and would like to have another testosterone level checked.  He sees Dr. Wendy Poet as a urologist about once a year.  If we check the level and then repeat the level we will let the urologist know the results that he can determine any meds for this.  The patient denies chest pain pressure tightness and shortness of breath.  He has had some nausea.  He has been taking some of his wife's proton pump inhibitors when Zantac went off the market.  We did encourage him to go back on the Pepcid [facility] instead of the Zantac.  He is passing his water without problems.  His vital signs are stable today.  His body mass index is 32.46.    Patient Active Problem List   Diagnosis Date Noted  . Squamous cell cancer of skin of nose 07/01/2017  . Erectile dysfunction due to arterial insufficiency 07/01/2017  . 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 Iron malabsorption 05/28/2016  . Hyperlipidemia LDL goal <100 04/12/2014  . Polycythemia vera ([place]) 04/19/2013  . Family history of early CAD 02/19/2013  . Iron  deficiency anemia 09/01/2012  . H/O vitamin D deficiency 05/21/2012  . Diverticulosis of colon 05/21/2012  . Carotid artery stenosis, asymptomatic 05/21/2012  . BPH (benign prostatic hyperplasia)   . Cardiomegaly   . Thrombocytopenia ([place])   . Lap Nissen Fundoplication August 9563 11/26/2011  . Weight gain, abnormal 08/27/2009  . ESSENTIAL HYPERTENSION, BENIGN 08/27/2009  . OSA (obstructive sleep apnea) 02/19/2008  . COPD, mild ([place]) 05/26/2007   Outpatient Encounter Medications as of 03/14/2018  Medication Sig  . alfuzosin (UROXATRAL) 10 MG 24 hr tablet Take 10 mg by mouth every morning.   [name] aspirin 81 MG tablet Take 81 mg by mouth 2 (two) times daily.   . cetirizine (ZYRTEC) 10 MG tablet Take 10 mg by mouth daily.  . Cholecalciferol (VITAMIN D-3) 5000 UNITS TABS Take 1 tablet by mouth daily.   . Coenzyme Q10 (CO Q 10 PO) Take 1 capsule by mouth daily.   . cyclobenzaprine (FLEXERIL) 10 MG tablet Take 1 tablet (10 mg total) by mouth 3 (three) times daily as needed for muscle spasms.  [name] dicyclomine (BENTYL) 20 MG tablet Take 1 tablet (20 mg total) by mouth every 6 (six) hours.  . docusate sodium (COLACE) 100 MG capsule Take 200 mg by mouth daily as needed for mild constipation.  . fish oil-omega-3 fatty acids 1000 MG capsule Take 2 g  by mouth daily.   . Glucosamine-Chondroit-Vit C-Mn (GLUCOSAMINE CHONDR 1500 COMPLX) CAPS Take by mouth every morning.  Vanessa Kick Ethyl (VASCEPA) 1 g CAPS Take 2 capsules (2 g total) by mouth 2 (two) times daily.  . Ipratropium-Albuterol (COMBIVENT) 20-100 MCG/ACT AERS respimat Inhale 1 puff into the lungs as needed for wheezing or shortness of breath.  . losartan (COZAAR) 50 MG tablet Take 1 tablet (50 mg total) by mouth daily.  . montelukast (SINGULAIR) 10 MG tablet Take 1 tablet (10 mg total) by mouth at bedtime.  . Multiple Vitamins-Minerals (CENTRUM SILVER ULTRA MENS) TABS Take 1 tablet by mouth every morning. Once a day  . ondansetron (ZOFRAN-ODT) 8 MG  disintegrating tablet TAKE ONE TABLET BY MOUTH EVERY 8 HOURS AS NEEDED FOR NAUSEA OR VOMITING  . polyethylene glycol (MIRALAX / GLYCOLAX) packet Take 17 g by mouth as needed.   . Probiotic Product (PHILLIPS COLON HEALTH PO) Take 1 capsule by mouth daily.   [name] pyridOXINE (VITAMIN B-6) 100 MG tablet Take 100 mg by mouth daily.   . rosuvastatin (CRESTOR) 10 MG tablet TAKE 1 TABLET (10 MG TOTAL) BY MOUTH DAILY.  . sildenafil (REVATIO) 20 MG tablet Take 2-5 tabs prior to sexual activity  . Turmeric 500 MG CAPS Take by mouth every morning.  . vitamin B-12 (CYANOCOBALAMIN) 500 MCG tablet Take 500 mcg by mouth daily.   No facility-administered encounter medications on file as of 03/14/2018.       Review of Systems  Constitutional: Positive for fatigue.  HENT: Negative.   Eyes: Negative.   Respiratory: Negative.   Cardiovascular: Negative.   Gastrointestinal: Negative.   Endocrine: Negative.   Genitourinary: Negative.   Musculoskeletal: Negative.   Skin: Negative.   Allergic/Immunologic: Negative.   Neurological: Negative.   Hematological: Negative.   Psychiatric/Behavioral: Negative.        Objective:   Physical Exam  Constitutional: He is oriented to person, place, and time. He appears well-developed and well-nourished. No distress.  The patient is pleasant and relaxed and in good spirits.  HENT:  Head: Normocephalic and atraumatic.  Right Ear: External ear normal.  Left Ear: External ear normal.  Nose: Nose normal.  Mouth/Throat: Oropharynx is clear and moist. No oropharyngeal exudate.  Eyes: Pupils are equal, round, and reactive to light. Conjunctivae and EOM are normal. Right eye exhibits no discharge. Left eye exhibits no discharge. No scleral icterus.  He needs to keep his eye exams at least every 2 years.  Neck: Normal range of motion. Neck supple. No tracheal deviation present. No thyromegaly present.  No bruits thyromegaly or anterior cervical adenopathy  Cardiovascular:  Normal rate, regular rhythm, normal heart sounds and intact distal pulses.  No murmur heard. The heart is regular at 84/min with good pedal pulses and no edema  Pulmonary/Chest: Effort normal and breath sounds normal. He has no wheezes. He has no rales.  Clear anteriorly and posteriorly no axillary adenopathy or chest wall masses  Abdominal: Soft. Bowel sounds are normal. He exhibits no mass. There is no tenderness. There is no rebound and no guarding.  No liver or spleen enlargement.  No epigastric tenderness.  No masses no bruits and no inguinal adenopathy.  Genitourinary:  Genitourinary Comments: This exam is done regularly by his urologist Dr. McDiarmid.  We will do the testosterone level on him and if his low repeat this in a couple weeks later and let his urologist know about this so he can decide what he wants to do  to try to replace this.  Musculoskeletal: Normal range of motion. He exhibits no edema.  Lymphadenopathy:    He has no cervical adenopathy.  Neurological: He is alert and oriented to person, place, and time. He has normal reflexes. No cranial nerve deficit.  Reflexes are 2+ and equal bilaterally  Skin: Skin is warm and dry. No rash noted.  Psychiatric: He has a normal mood and affect. His behavior is normal. Judgment and thought content normal.  The patient's mood affect and behavior are normal for him and positive.  Nursing note and vitals reviewed.  BP 119/60 (BP Location: Left Arm)   Pulse 90   Temp 98.1 F (36.7 C) (Oral)   Ht 5' 11.5" (1.816 m)   Wt 238 lb (108 kg)   BMI 32.73 kg/m         Assessment & Plan:  1. Benign prostatic hyperplasia, unspecified whether lower urinary tract symptoms present -Follow-up with urology as planned - Testosterone,Free and Total  2. Essential hypertension, benign -The blood pressure is good he will continue with current treatment  3. Pure hypercholesterolemia -Continue with current treatment and more aggressive  therapeutic lifestyle changes including diet and exercise - Lipid panel  4. Vitamin D deficiency -Continue with vitamin D replacement pending results of lab work - VITAMIN D 25 Hydroxy (Vit-D Deficiency, Fractures)  5. Thrombocytopenia ([place]) -Continue to follow-up with hematology  6. Other fatigue - Testosterone,Free and Total - Thyroid Panel With TSH  7. Polycythemia vera ([place]) -Follow-up with hematology as planned  8. COPD, mild ([place]) -No complaints with breathing coughing or congestion today.  9. Testosterone deficiency -Patient has had a history of this in the past but stopped the treatment and because of his ongoing fatigue and tiredness would like to have this rechecked and we will do that.  Meds ordered this encounter  Medications  . rosuvastatin (CRESTOR) 10 MG tablet    Sig: TAKE 1 TABLET (10 MG TOTAL) BY MOUTH DAILY.    Dispense:  90 tablet    Refill:  3    Patient Instructions                       Medicare Annual Wellness Visit  [facility] and the medical providers at [facility] strive to bring you the best medical care.  In doing so we not only want to address your current medical conditions and concerns but also to detect new conditions early and prevent illness, disease and health-related problems.    Medicare offers a yearly Wellness Visit which allows our clinical staff to assess your need for preventative services including immunizations, lifestyle education, counseling to decrease risk of preventable diseases and screening for fall risk and other medical concerns.    This visit is provided free of charge (no copay) for all Medicare recipients. The clinical pharmacists at [facility] have begun to conduct these Wellness Visits which will also include a thorough review of all your medications.    As you primary medical provider recommend that you make an appointment for your Annual Wellness Visit if you have not  done so already this year.  You may set up this appointment before you leave today or you may call back ([phone]) and schedule an appointment.  Please make sure when you call that you mention that you are scheduling your Annual Wellness Visit with the clinical pharmacist so that the appointment may be made for the proper length of  time.     Continue current medications. Continue good therapeutic lifestyle changes which include good diet and exercise. Fall precautions discussed with patient. If an FOBT was given today- please return it to our front desk. If you are over 45 years old - you may need Prevnar 76 or the adult Pneumonia vaccine.  **Flu shots are available--- please call and schedule a FLU-CLINIC appointment**  After your visit with [place] today you will receive a survey in the mail or online from [employer] regarding your care with [place]. Please take a moment to fill this out. Your feedback is very important to [place] as you can help [place] better understand your patient needs as well as improve your experience and satisfaction. WE CARE ABOUT YOU!!!   Continue to follow-up with hematology and urology Make every effort to get more exercise and lose weight with diet and exercise. Drink more water Eat less carbs and sugar    Arrie Senate MD

## 2018-03-15 ENCOUNTER — Other Ambulatory Visit: Payer: Self-pay | Admitting: *Deleted

## 2018-03-15 DIAGNOSIS — R5383 Other fatigue: Secondary | ICD-10-CM

## 2018-03-15 LAB — VITAMIN D 25 HYDROXY (VIT D DEFICIENCY, FRACTURES): Vit D, 25-Hydroxy: 53.4 ng/mL (ref 30.0–100.0)

## 2018-03-15 LAB — THYROID PANEL WITH TSH
Free Thyroxine Index: 1.6 (ref 1.2–4.9)
T3 Uptake Ratio: 25 % (ref 24–39)
T4 TOTAL: 6.4 ug/dL (ref 4.5–12.0)
TSH: 4.04 u[IU]/mL (ref 0.450–4.500)

## 2018-03-15 LAB — LIPID PANEL
CHOL/HDL RATIO: 3.1 ratio (ref 0.0–5.0)
Cholesterol, Total: 109 mg/dL (ref 100–199)
HDL: 35 mg/dL — ABNORMAL LOW (ref >39)
LDL Calculated: 59 mg/dL (ref 0–99)
Triglycerides: 73 mg/dL (ref 0–149)
VLDL Cholesterol Cal: 15 mg/dL (ref 5–40)

## 2018-03-15 LAB — TESTOSTERONE,FREE AND TOTAL
Testosterone, Free: 8.6 pg/mL (ref 6.6–18.1)
Testosterone: 352 ng/dL (ref 264–916)

## 2018-03-15 LAB — SPECIMEN STATUS REPORT

## 2018-04-03 ENCOUNTER — Ambulatory Visit: Payer: Medicare Other | Admitting: *Deleted

## 2018-04-07 ENCOUNTER — Inpatient Hospital Stay (HOSPITAL_BASED_OUTPATIENT_CLINIC_OR_DEPARTMENT_OTHER): Payer: Medicare Other | Admitting: Family

## 2018-04-07 ENCOUNTER — Inpatient Hospital Stay: Payer: Medicare Other | Attending: Hematology & Oncology

## 2018-04-07 ENCOUNTER — Other Ambulatory Visit: Payer: Self-pay

## 2018-04-07 ENCOUNTER — Encounter: Payer: Self-pay | Admitting: Family

## 2018-04-07 ENCOUNTER — Ambulatory Visit (INDEPENDENT_AMBULATORY_CARE_PROVIDER_SITE_OTHER): Payer: Medicare Other | Admitting: *Deleted

## 2018-04-07 VITALS — BP 133/73 | HR 66 | Temp 98.3°F | Resp 18 | Wt 239.5 lb

## 2018-04-07 VITALS — BP 108/62 | HR 83 | Temp 98.0°F | Ht 71.5 in | Wt 238.0 lb

## 2018-04-07 DIAGNOSIS — Z79899 Other long term (current) drug therapy: Secondary | ICD-10-CM | POA: Diagnosis not present

## 2018-04-07 DIAGNOSIS — Z0000 Encounter for general adult medical examination without abnormal findings: Secondary | ICD-10-CM

## 2018-04-07 DIAGNOSIS — D500 Iron deficiency anemia secondary to blood loss (chronic): Secondary | ICD-10-CM

## 2018-04-07 DIAGNOSIS — D751 Secondary polycythemia: Secondary | ICD-10-CM | POA: Insufficient documentation

## 2018-04-07 DIAGNOSIS — D508 Other iron deficiency anemias: Secondary | ICD-10-CM

## 2018-04-07 DIAGNOSIS — Z7982 Long term (current) use of aspirin: Secondary | ICD-10-CM | POA: Insufficient documentation

## 2018-04-07 DIAGNOSIS — D509 Iron deficiency anemia, unspecified: Secondary | ICD-10-CM | POA: Insufficient documentation

## 2018-04-07 DIAGNOSIS — R5383 Other fatigue: Secondary | ICD-10-CM

## 2018-04-07 DIAGNOSIS — J449 Chronic obstructive pulmonary disease, unspecified: Secondary | ICD-10-CM | POA: Diagnosis not present

## 2018-04-07 LAB — CBC WITH DIFFERENTIAL (CANCER CENTER ONLY)
Abs Immature Granulocytes: 0.02 10*3/uL (ref 0.00–0.07)
BASOS PCT: 1 %
Basophils Absolute: 0 10*3/uL (ref 0.0–0.1)
EOS PCT: 3 %
Eosinophils Absolute: 0.1 10*3/uL (ref 0.0–0.5)
HCT: 44.6 % (ref 39.0–52.0)
Hemoglobin: 13.8 g/dL (ref 13.0–17.0)
Immature Granulocytes: 0 %
Lymphocytes Relative: 22 %
Lymphs Abs: 1.1 10*3/uL (ref 0.7–4.0)
MCH: 26.9 pg (ref 26.0–34.0)
MCHC: 30.9 g/dL (ref 30.0–36.0)
MCV: 86.9 fL (ref 80.0–100.0)
MONO ABS: 0.4 10*3/uL (ref 0.1–1.0)
Monocytes Relative: 7 %
NEUTROS ABS: 3.3 10*3/uL (ref 1.7–7.7)
Neutrophils Relative %: 67 %
PLATELETS: 147 10*3/uL — AB (ref 150–400)
RBC: 5.13 MIL/uL (ref 4.22–5.81)
RDW: 13.2 % (ref 11.5–15.5)
WBC Count: 5 10*3/uL (ref 4.0–10.5)
nRBC: 0 % (ref 0.0–0.2)

## 2018-04-07 LAB — CMP (CANCER CENTER ONLY)
ALBUMIN: 4.1 g/dL (ref 3.5–5.0)
ALT: 21 U/L (ref 0–44)
AST: 23 U/L (ref 15–41)
Alkaline Phosphatase: 57 U/L (ref 38–126)
Anion gap: 6 (ref 5–15)
BUN: 18 mg/dL (ref 8–23)
CHLORIDE: 107 mmol/L (ref 98–111)
CO2: 26 mmol/L (ref 22–32)
Calcium: 8.7 mg/dL — ABNORMAL LOW (ref 8.9–10.3)
Creatinine: 0.93 mg/dL (ref 0.61–1.24)
GFR, Estimated: >60 mL/min (ref >60)
Glucose, Bld: 111 mg/dL — ABNORMAL HIGH (ref 70–99)
Potassium: 4.1 mmol/L (ref 3.5–5.1)
SODIUM: 139 mmol/L (ref 135–145)
Total Bilirubin: 0.6 mg/dL (ref 0.3–1.2)
Total Protein: 6.3 g/dL — ABNORMAL LOW (ref 6.5–8.1)

## 2018-04-07 NOTE — Patient Instructions (Signed)
  [name] , Thank you for taking time to come for your Medicare Wellness Visit. I appreciate your ongoing commitment to your health goals. Please review the following plan we discussed and let me know if I can assist you in the future.   These are the goals we discussed: Goals    . Exercise 3x per week (30 min per time)     Walk daily     . Weight (lb) < 215 lb (97.5 kg)       This is a list of the screening recommended for you and due dates:  Health Maintenance  Topic Date Due  . Tetanus Vaccine  07/02/2018*  .  Hepatitis C: One time screening is recommended by Center for Disease Control  (CDC) for  adults born from [birth year] through 1967.   07/03/2023*  . Stool Blood Test  06/29/2018  . Colon Cancer Screening  06/02/2022  . Flu Shot  Completed  . Pneumonia vaccines  Completed  *Topic was postponed. The date shown is not the original due date.   Keep follow up with DR Laurance Flatten and other specialist

## 2018-04-07 NOTE — Progress Notes (Signed)
Hematology and Oncology Follow Up Visit  Connor [name] [id number] [birth year] 72 y.o. 04/07/2018   Principle Diagnosis:  Secondary polycythemia(COPD)- Triple negative Chronic immune thrombocytopenia vs medication Transient iron deficiency secondary to phlebotomies  Current Therapy:   Phlebotomy to maintain hematocrit below 45% Aspirin 81 mg by mouth daily IV iron as indicated    Interim History:  Connor [name] is here today with his wife for follow-up. He is doing well and has no complaints at this time.  Hct is 44.6% and he plans to donate blood at his [facility] blood drive on January 92JJ.  He states that he is taking his baby aspirin daily.  He denies fever, chills, n/v, cough, rash, dizziness, SOB, chest pain, palpitations, abdominal pain or changes in bowel or bladder habits.  No swelling, tenderness, numbness or tingling in his extremities at this time.  He has intermittent numbness and tingling in the right thigh. This is unchanged. No bleeding, bruising or petechiae.   No lymphadenopathy noted on exam.  He has maintained a good appetite is staying well hydrated. His weight is stable.   ECOG Performance Status: 1 - Symptomatic but completely ambulatory  Medications:  Allergies as of 04/07/2018      Reactions   Dexlansoprazole Nausea And Vomiting   Celebrex [celecoxib]    Solid white - generic pill only = caused diarrhea (other generics ok)       Medication List       Accurate as of April 07, 2018 10:55 AM. Always use your most recent med list.        alfuzosin 10 MG 24 hr tablet Commonly known as:  UROXATRAL Take 10 mg by mouth every morning.   aspirin 81 MG tablet Take 81 mg by mouth 2 (two) times daily.   CENTRUM SILVER ULTRA MENS Tabs Take 1 tablet by mouth every morning. Once a day   cetirizine 10 MG tablet Commonly known as:  ZYRTEC Take 10 mg by mouth daily.   CO Q 10 PO Take 1 capsule by mouth daily.   cyclobenzaprine 10 MG tablet Commonly  known as:  FLEXERIL Take 1 tablet (10 mg total) by mouth 3 (three) times daily as needed for muscle spasms.   dicyclomine 20 MG tablet Commonly known as:  BENTYL Take 1 tablet (20 mg total) by mouth every 6 (six) hours.   docusate sodium 100 MG capsule Commonly known as:  COLACE Take 200 mg by mouth daily as needed for mild constipation.   GLUCOSAMINE CHONDR 1500 COMPLX Caps Take by mouth every morning.   Icosapent Ethyl 1 g Caps Commonly known as:  VASCEPA Take 2 capsules (2 g total) by mouth 2 (two) times daily.   Ipratropium-Albuterol 20-100 MCG/ACT Aers respimat Commonly known as:  COMBIVENT Inhale 1 puff into the lungs as needed for wheezing or shortness of breath.   losartan 50 MG tablet Commonly known as:  COZAAR Take 1 tablet (50 mg total) by mouth daily.   montelukast 10 MG tablet Commonly known as:  SINGULAIR Take 1 tablet (10 mg total) by mouth at bedtime.   ondansetron 8 MG disintegrating tablet Commonly known as:  ZOFRAN-ODT TAKE ONE TABLET BY MOUTH EVERY 8 HOURS AS NEEDED FOR NAUSEA OR VOMITING   PHILLIPS COLON HEALTH PO Take 1 capsule by mouth daily.   polyethylene glycol packet Commonly known as:  MIRALAX / GLYCOLAX Take 17 g by mouth as needed.   pyridOXINE 100 MG tablet Commonly known as:  VITAMIN B-6 Take  100 mg by mouth daily.   rosuvastatin 10 MG tablet Commonly known as:  CRESTOR TAKE 1 TABLET (10 MG TOTAL) BY MOUTH DAILY.   sildenafil 20 MG tablet Commonly known as:  REVATIO Take 2-5 tabs prior to sexual activity   Turmeric 500 MG Caps Take by mouth every morning.   vitamin B-12 500 MCG tablet Commonly known as:  CYANOCOBALAMIN Take 500 mcg by mouth daily.   Vitamin D-3 125 MCG (5000 UT) Tabs Take 1 tablet by mouth daily.       Allergies:  Allergies  Allergen Reactions  . Dexlansoprazole Nausea And Vomiting  . Celebrex [Celecoxib]     Solid white - generic pill only = caused diarrhea (other generics ok)     Past Medical  History, Surgical history, Social history, and Family History were reviewed and updated.  Review of Systems: All other 10 point review of systems is negative.   Physical Exam:  vitals were not taken for this visit.   Wt Readings from Last 3 Encounters:  04/07/18 238 lb (108 kg)  03/14/18 238 lb (108 kg)  02/24/18 236 lb (107 kg)    Ocular: Sclerae unicteric, pupils equal, round and reactive to light Ear-nose-throat: Oropharynx clear, dentition fair Lymphatic: No cervical, supraclavicular or axillary adenopathy Lungs no rales or rhonchi, good excursion bilaterally Heart regular rate and rhythm, no murmur appreciated Abd soft, nontender, positive bowel sounds, no liver or spleen tip palpated on exam, no fluid wave  MSK no focal spinal tenderness, no joint edema Neuro: non-focal, well-oriented, appropriate affect Breasts: Deferred   Lab Results  Component Value Date   WBC 5.0 04/07/2018   HGB 13.8 04/07/2018   HCT 44.6 04/07/2018   MCV 86.9 04/07/2018   PLT 147 (L) 04/07/2018   Lab Results  Component Value Date   FERRITIN 5 (L) 02/24/2018   IRON 88 02/24/2018   TIBC 389 02/24/2018   UIBC 301 02/24/2018   IRONPCTSAT 23 02/24/2018   Lab Results  Component Value Date   RETICCTPCT 0.8 09/06/2013   RBC 5.13 04/07/2018   RETICCTABS 40.2 09/06/2013   No results found for: KPAFRELGTCHN, LAMBDASER, KAPLAMBRATIO No results found for: IGGSERUM, IGA, IGMSERUM No results found for: Odetta Pink, SPEI   Chemistry      Component Value Date/Time   NA 142 02/24/2018 1048   NA 147 (H) 11/29/2017 1015   NA 139 04/13/2017 1150   NA 141 07/09/2016 1038   K 4.2 02/24/2018 1048   K 4.0 04/13/2017 1150   K 4.0 07/09/2016 1038   CL 109 02/24/2018 1048   CL 104 04/13/2017 1150   CO2 26 02/24/2018 1048   CO2 26 04/13/2017 1150   CO2 22 07/09/2016 1038   BUN 12 02/24/2018 1048   BUN 14 11/29/2017 1015   BUN 17 04/13/2017 1150    BUN 19.0 07/09/2016 1038   CREATININE 1.02 02/24/2018 1048   CREATININE 1.0 04/13/2017 1150   CREATININE 0.9 07/09/2016 1038      Component Value Date/Time   CALCIUM 9.3 02/24/2018 1048   CALCIUM 9.4 04/13/2017 1150   CALCIUM 9.5 07/09/2016 1038   ALKPHOS 66 02/24/2018 1048   ALKPHOS 64 04/13/2017 1150   ALKPHOS 85 07/09/2016 1038   AST 29 02/24/2018 1048   AST 22 07/09/2016 1038   ALT 22 02/24/2018 1048   ALT 26 04/13/2017 1150   ALT 22 07/09/2016 1038   BILITOT 0.7 02/24/2018 1048   BILITOT 0.62  07/09/2016 1038       Impression and Plan: Mr. Birkeland is a very pleasant 72 yo caucasian gentleman with secondary polycythemia (COPD).  Hct is stable at 44.6%. He will be donating at his church blood drive on January 21JD.  We will plan to see him in another 2 months.  They will contact our office with any questions or concerns. We can certainly see her sooner if need be.    Laverna Peace, NP 12/27/201910:55 AM

## 2018-04-07 NOTE — Progress Notes (Addendum)
Subjective:   Connor [name] is a 72 y.o. male who presents for Medicare Annual/Subsequent preventive examination. He is retired from [employer] and enjoys traveling and playing golf. He exercises daily wether it be lifting weights, stretching and playing golf and walking. He states that his diet is semi-healthy and that he gets in 3 meals a day. He is active in church and he lives at [place] with his wife. They have 2 daughters and 1 son and they all live local. He does not have any pets and fall hazards were discussed at length. He states that his health is about the same as a year ago.    Cardiac Risk Factors include: advanced age (>74men, >66 women);hypertension;male gender;dyslipidemia     Objective:    Vitals: BP 108/62 (BP Location: Left Arm)   Pulse 83   Temp 98 F (36.7 C) (Oral)   Ht 5' 11.5" (1.816 m)   Wt 238 lb (108 kg)   BMI 32.73 kg/m   Body mass index is 32.73 kg/m.  Advanced Directives 04/07/2018 02/24/2018 01/26/2018 11/09/2017 09/28/2017 08/19/2017 08/17/2017  Does Patient Have a Medical Advance Directive? No No No No No No No  Would patient like information on creating a medical advance directive? Yes (MAU/Ambulatory/Procedural Areas - Information given) - - No - Patient declined No - Patient declined No - Patient declined -  Pre-existing out of facility DNR order (yellow form or pink MOST form) - - - - - - -    Tobacco Social History   Tobacco Use  Smoking Status Former Smoker  . Packs/day: 0.25  . Years: 11.00  . Pack years: 2.75  . Types: Cigarettes  . Start date: 05/17/1958  . Last attempt to quit: 04/12/1966  . Years since quitting: 52.0  Smokeless Tobacco Never Used  Tobacco Comment   quit smoking in 1968     Counseling given: Not Answered Comment: quit smoking in 1968    Past Medical History:  Diagnosis Date  . Anemia   . BPH (benign prostatic hyperplasia)   . Cancer (HCC)    squamous cell on nose , inside nose   . Cardiomegaly   .  Carotid artery stenosis   . COPD, mild ([place]) 05/26/2007   PFT 09/09/11>>FEV1 3.06 (95%), FEV1% 69, FEF 25-75% 1.70 (59%), TLC 7.67 (109%), DLCO 108%, no BD    . Cough 05/26/2007  . Erectile dysfunction   . GERD (gastroesophageal reflux disease)   . H/O vitamin D deficiency   . History of nuclear stress test    this revealed normal myocardial perfusion and function. Post stress EF was 57%. There was no region of scar or ischemia.  [name] Hx of echocardiogram 02/15/2012   This showed mild concentric LVH. EF > 55%. H edid have grade 1 diastolic dysfunction with mitral valve E:A ratio of 0.81, his left atrium was mildly dilated by volume assessment at 33.3 mL/m2. He did have mild tricupid regurgitation with upper normal RV systolic pressure at 17OH. He had aortic valve sclerosis without stenosis.  . Hyperlipemia   . Hypertension   . Iron deficiency anemia, unspecified 09/01/2012  . Iron malabsorption 05/28/2016  . Nephrolithiasis    stones   . Peyronie's disease   . Polycythemia vera(238.4) 04/19/2013  . Shortness of breath    with exertion   . Sleep apnea    Cpap-   . Thrombocytopenia ([place])    Past Surgical History:  Procedure Laterality Date  . APPENDECTOMY    .  EYE SURGERY Bilateral    eye lid surgery  . HIATAL HERNIA REPAIR  11/24/2011   Procedure: LAPAROSCOPIC REPAIR OF HIATAL HERNIA;  Surgeon: Pedro Earls, MD;  Location: [facility];  Service: General;;  . KNEE SURGERY     right  . LAPAROSCOPIC NISSEN FUNDOPLICATION  0/17/5102   Procedure: LAPAROSCOPIC NISSEN FUNDOPLICATION;  Surgeon: Pedro Earls, MD;  Location: [facility];  Service: General;  Laterality: N/A;  . NOSE SURGERY  2013  . SHOULDER SURGERY     left   Family History  Problem Relation Age of Onset  . Heart disease Mother   . Diabetes Mother   . Heart disease Father   . Heart disease Sister   . Arthritis Sister   . Diabetes Brother   . Hypertension Brother   . Heart attack Brother        enlarged heart  . Liver  cancer Paternal Grandfather   . Throat cancer Maternal Grandfather   . Hemachromatosis Daughter   . Arthritis Daughter   . GI problems Daughter   . Thyroid disease Daughter   . Hypertension Daughter   . Heart disease Paternal Grandmother   . Thyroid disease Daughter        pituitary tumor  . Migraines Daughter   . Colon cancer Neg Hx   . Esophageal cancer Neg Hx   . Rectal cancer Neg Hx   . Stomach cancer Neg Hx    Social History   Socioeconomic History  . Marital status: Married    Spouse name: Hoyle Sauer   . Number of children: 3  . Years of education: Not on file  . Highest education level: Not on file  Occupational History  . 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 Financial resource strain: Not on file  . Food insecurity:    Worry: Not on file    Inability: Not on file  . Transportation needs:    Medical: Not on file    Non-medical: Not on file  Tobacco Use  . Smoking status: Former Smoker    Packs/day: 0.25    Years: 11.00    Pack years: 2.75    Types: Cigarettes    Start date: 05/17/1958    Last attempt to quit: 04/12/1966    Years since quitting: 52.0  . Smokeless tobacco: Never Used  . Tobacco comment: quit smoking in 1968  Substance and Sexual Activity  . Alcohol use: Yes    Alcohol/week: 0.0 standard drinks    Comment: seldom  . Drug use: No  . Sexual activity: Not on file  Lifestyle  . Physical activity:    Days per week: Not on file    Minutes per session: Not on file  . Stress: Not on file  Relationships  . Social connections:    Talks on phone: Not on file    Gets together: Not on file    Attends religious service: Not on file    Active member of club or organization: Not on file    Attends meetings of clubs or organizations: Not on file    Relationship status: Not on file  Other Topics Concern  . Not on file  Social History Narrative  . Not on file    Outpatient Encounter Medications as  of 04/07/2018  Medication Sig  . alfuzosin (UROXATRAL) 10 MG 24 hr tablet Take 10 mg by mouth every morning.   [name] aspirin  81 MG tablet Take 81 mg by mouth 2 (two) times daily.   . cetirizine (ZYRTEC) 10 MG tablet Take 10 mg by mouth daily.  . Cholecalciferol (VITAMIN D-3) 5000 UNITS TABS Take 1 tablet by mouth daily.   . Coenzyme Q10 (CO Q 10 PO) Take 1 capsule by mouth daily.   . cyclobenzaprine (FLEXERIL) 10 MG tablet Take 1 tablet (10 mg total) by mouth 3 (three) times daily as needed for muscle spasms.  [name] docusate sodium (COLACE) 100 MG capsule Take 200 mg by mouth daily as needed for mild constipation.  . Glucosamine-Chondroit-Vit C-Mn (GLUCOSAMINE CHONDR 1500 COMPLX) CAPS Take by mouth every morning.  Vanessa Kick Ethyl (VASCEPA) 1 g CAPS Take 2 capsules (2 g total) by mouth 2 (two) times daily.  . Ipratropium-Albuterol (COMBIVENT) 20-100 MCG/ACT AERS respimat Inhale 1 puff into the lungs as needed for wheezing or shortness of breath.  . losartan (COZAAR) 50 MG tablet Take 1 tablet (50 mg total) by mouth daily.  . montelukast (SINGULAIR) 10 MG tablet Take 1 tablet (10 mg total) by mouth at bedtime.  . Multiple Vitamins-Minerals (CENTRUM SILVER ULTRA MENS) TABS Take 1 tablet by mouth every morning. Once a day  . Probiotic Product (PHILLIPS COLON HEALTH PO) Take 1 capsule by mouth daily.   [name] pyridOXINE (VITAMIN B-6) 100 MG tablet Take 100 mg by mouth daily.   . rosuvastatin (CRESTOR) 10 MG tablet TAKE 1 TABLET (10 MG TOTAL) BY MOUTH DAILY.  . Turmeric 500 MG CAPS Take by mouth every morning.  . vitamin B-12 (CYANOCOBALAMIN) 500 MCG tablet Take 500 mcg by mouth daily.  [name] dicyclomine (BENTYL) 20 MG tablet Take 1 tablet (20 mg total) by mouth every 6 (six) hours. (Patient not taking: Reported on 04/07/2018)  . ondansetron (ZOFRAN-ODT) 8 MG disintegrating tablet TAKE ONE TABLET BY MOUTH EVERY 8 HOURS AS NEEDED FOR NAUSEA OR VOMITING (Patient not taking: Reported on 04/07/2018)  . polyethylene  glycol (MIRALAX / GLYCOLAX) packet Take 17 g by mouth as needed.   . sildenafil (REVATIO) 20 MG tablet Take 2-5 tabs prior to sexual activity (Patient not taking: Reported on 04/07/2018)  . [DISCONTINUED] fish oil-omega-3 fatty acids 1000 MG capsule Take 2 g by mouth daily.    No facility-administered encounter medications on file as of 04/07/2018.     Activities of Daily Living In your present state of health, do you have any difficulty performing the following activities: 04/07/2018  Hearing? N  Vision? Y  Comment wears RX glasses   Difficulty concentrating or making decisions? N  Walking or climbing stairs? N  Dressing or bathing? N  Doing errands, shopping? N  Preparing Food and eating ? N  Using the Toilet? N  In the past six months, have you accidently leaked urine? N  Do you have problems with loss of bowel control? N  Managing your Medications? N  Managing your Finances? N  Housekeeping or managing your Housekeeping? N  Some recent data might be hidden    Patient Care Team: [name], MD as PCP - General (Family Medicine) Troy Sine, MD as PCP - Cardiology (Cardiology) Troy Sine, MD as Consulting Physician (Cardiology) Roseanne Kaufman, MD as Consulting Physician (Orthopedic Surgery) Gatha Mayer, MD as Consulting Physician (Gastroenterology) Wylene Simmer, MD as Consulting Physician (Orthopedic Surgery) Melissa Montane, MD as Consulting Physician (Otolaryngology) Chesley Mires, MD as Consulting Physician (Pulmonary Disease) Susa Day, MD as Consulting Physician (Orthopedic Surgery) Sydnee Cabal, MD as Consulting Physician (  Orthopedic Surgery) Volanda Napoleon, MD as Consulting Physician (Oncology)   Assessment:   This is a routine wellness examination for Connor [name].  Exercise Activities and Dietary recommendations Current Exercise Habits: Home exercise routine, Type of exercise: strength training/weights;Other - see comments(golf ), Time  (Minutes): 40, Frequency (Times/Week): 7, Weekly Exercise (Minutes/Week): 280, Intensity: Mild, Exercise limited by: None identified  Goals    . Exercise 3x per week (30 min per time)     Walk daily     . Weight (lb) < 215 lb (97.5 kg)       Fall Risk Fall Risk  04/07/2018 03/14/2018 11/08/2017 07/01/2017 03/31/2017  Falls in the past year? 0 0 No No No   Is the patient's home free of loose throw rugs in walkways, pet beds, electrical cords, etc?  Fall risks and hazards were discussed today   Depression Screen PHQ 2/9 Scores 04/07/2018 03/14/2018 11/08/2017 07/01/2017  PHQ - 2 Score 0 0 1 4  PHQ- 9 Score - - - 10    Cognitive Function MMSE - Mini Mental State Exam 04/07/2018 03/31/2017  Not completed: Refused -  Orientation to time 5 5  Orientation to Place 5 5  Registration 3 3  Attention/ Calculation 5 5  Recall 3 3  Language- name 2 objects 2 2  Language- repeat 1 1  Language- follow 3 step command 3 3  Language- read & follow direction 1 1  Write a sentence 1 1  Copy design 1 1  Total score 30 30        Immunization History  Administered Date(s) Administered  . Influenza Whole 01/11/2011  . Influenza, High Dose Seasonal PF 02/25/2016, 02/02/2017, 01/31/2018  . Influenza,inj,Quad PF,6+ Mos 02/07/2013, 01/17/2014, 03/26/2015  . Influenza,inj,quad, With Preservative 01/10/2017  . Influenza-Unspecified 03/26/2015  . Pneumococcal Conjugate-13 09/07/2013  . Pneumococcal Polysaccharide-23 02/10/2009  . Pneumococcal-Unspecified 08/11/2015  . Tdap 08/11/2006  . Zoster 08/11/2006    Qualifies for Shingles Vaccine? Had the original zostavax in 2008  Screening Tests Health Maintenance  Topic Date Due  . TETANUS/TDAP  07/02/2018 (Originally 08/10/2016)  . Hepatitis C Screening  07/03/2023 (Originally 1945/10/09)  . COLON CANCER SCREENING ANNUAL FOBT  06/29/2018  . COLONOSCOPY  06/02/2022  . INFLUENZA VACCINE  Completed  . PNA vac Low Risk Adult  Completed   Cancer  Screenings: Lung: Low Dose CT Chest recommended if Age 60-80 years, 30 pack-year currently smoking OR have quit w/in 15years. Patient does not qualify. Colorectal: done 06/2017.  Additional Screenings: declined Hepatitis C Screening:      Plan:   pt is to keep follow up with DR Laurance Flatten and other specialist  He is up to date on HM   I have personally reviewed and noted the following in the patient's chart:   . Medical and social history . Use of alcohol, tobacco or illicit drugs  . Current medications and supplements . Functional ability and status . Nutritional status . Physical activity . Advanced directives . List of other physicians . Hospitalizations, surgeries, and ER visits in previous 12 months . Vitals . Screenings to include cognitive, depression, and falls . Referrals and appointments  In addition, I have reviewed and discussed with patient certain preventive protocols, quality metrics, and best practice recommendations. A written personalized care plan for preventive services as well as general preventive health recommendations were provided to patient.     Keivon Garden, Cameron Proud, LPN  02/63/7858  I have reviewed and agree with the above AWV documentation.  Mary-Margaret Hassell Done, FNP

## 2018-04-08 LAB — TESTOSTERONE: TESTOSTERONE: 350 ng/dL (ref 264–916)

## 2018-04-10 LAB — IRON AND TIBC
IRON: 107 ug/dL (ref 42–163)
Saturation Ratios: 29 % (ref 20–55)
TIBC: 363 ug/dL (ref 202–409)
UIBC: 256 ug/dL (ref 117–376)

## 2018-04-10 LAB — FERRITIN: Ferritin: 5 ng/mL — ABNORMAL LOW (ref 24–336)

## 2018-04-22 ENCOUNTER — Other Ambulatory Visit: Payer: Self-pay | Admitting: Family Medicine

## 2018-05-02 ENCOUNTER — Other Ambulatory Visit: Payer: Self-pay | Admitting: Hematology & Oncology

## 2018-05-22 ENCOUNTER — Telehealth: Payer: Self-pay | Admitting: Family Medicine

## 2018-05-22 ENCOUNTER — Other Ambulatory Visit: Payer: Self-pay | Admitting: *Deleted

## 2018-05-22 MED ORDER — ICOSAPENT ETHYL 1 G PO CAPS: 2.0000 | ORAL_CAPSULE | Freq: Two times a day (BID) | ORAL | 3 refills | Status: DC

## 2018-05-22 NOTE — Telephone Encounter (Signed)
Refill sent.

## 2018-06-08 ENCOUNTER — Inpatient Hospital Stay: Payer: Medicare Other

## 2018-06-08 ENCOUNTER — Other Ambulatory Visit: Payer: Self-pay

## 2018-06-08 ENCOUNTER — Inpatient Hospital Stay: Payer: Medicare Other | Attending: Hematology & Oncology | Admitting: Hematology & Oncology

## 2018-06-08 ENCOUNTER — Telehealth: Payer: Self-pay | Admitting: Hematology & Oncology

## 2018-06-08 ENCOUNTER — Encounter: Payer: Self-pay | Admitting: Hematology & Oncology

## 2018-06-08 VITALS — BP 140/74 | HR 81 | Temp 98.3°F | Resp 18 | Wt 239.2 lb

## 2018-06-08 DIAGNOSIS — D45 Polycythemia vera: Secondary | ICD-10-CM | POA: Insufficient documentation

## 2018-06-08 DIAGNOSIS — D500 Iron deficiency anemia secondary to blood loss (chronic): Secondary | ICD-10-CM

## 2018-06-08 DIAGNOSIS — D751 Secondary polycythemia: Secondary | ICD-10-CM

## 2018-06-08 LAB — IRON AND TIBC
IRON: 48 ug/dL (ref 42–163)
SATURATION RATIOS: 13 % — AB (ref 20–55)
TIBC: 371 ug/dL (ref 202–409)
UIBC: 323 ug/dL (ref 117–376)

## 2018-06-08 LAB — CBC WITH DIFFERENTIAL (CANCER CENTER ONLY)
Abs Immature Granulocytes: 0.02 10*3/uL (ref 0.00–0.07)
BASOS ABS: 0 10*3/uL (ref 0.0–0.1)
BASOS PCT: 1 %
EOS ABS: 0.2 10*3/uL (ref 0.0–0.5)
EOS PCT: 3 %
HCT: 43.2 % (ref 39.0–52.0)
Hemoglobin: 13.7 g/dL (ref 13.0–17.0)
Immature Granulocytes: 0 %
LYMPHS PCT: 15 %
Lymphs Abs: 0.9 10*3/uL (ref 0.7–4.0)
MCH: 27.2 pg (ref 26.0–34.0)
MCHC: 31.7 g/dL (ref 30.0–36.0)
MCV: 85.9 fL (ref 80.0–100.0)
MONO ABS: 0.6 10*3/uL (ref 0.1–1.0)
Monocytes Relative: 9 %
NRBC: 0 % (ref 0.0–0.2)
Neutro Abs: 4.4 10*3/uL (ref 1.7–7.7)
Neutrophils Relative %: 72 %
PLATELETS: 133 10*3/uL — AB (ref 150–400)
RBC: 5.03 MIL/uL (ref 4.22–5.81)
RDW: 13.8 % (ref 11.5–15.5)
WBC: 6.1 10*3/uL (ref 4.0–10.5)

## 2018-06-08 LAB — CMP (CANCER CENTER ONLY)
ALBUMIN: 4.4 g/dL (ref 3.5–5.0)
ALT: 15 U/L (ref 0–44)
ANION GAP: 6 (ref 5–15)
AST: 18 U/L (ref 15–41)
Alkaline Phosphatase: 57 U/L (ref 38–126)
BUN: 18 mg/dL (ref 8–23)
CO2: 30 mmol/L (ref 22–32)
Calcium: 9.4 mg/dL (ref 8.9–10.3)
Chloride: 107 mmol/L (ref 98–111)
Creatinine: 1 mg/dL (ref 0.61–1.24)
GFR, Est AFR Am: >60 mL/min (ref >60)
GFR, Estimated: >60 mL/min (ref >60)
Glucose, Bld: 135 mg/dL — ABNORMAL HIGH (ref 70–99)
POTASSIUM: 4.4 mmol/L (ref 3.5–5.1)
SODIUM: 143 mmol/L (ref 135–145)
Total Bilirubin: 0.6 mg/dL (ref 0.3–1.2)
Total Protein: 6.5 g/dL (ref 6.5–8.1)

## 2018-06-08 LAB — FERRITIN: Ferritin: 6 ng/mL — ABNORMAL LOW (ref 24–336)

## 2018-06-08 NOTE — Telephone Encounter (Signed)
Appointments were already scheduled/ AVS/Calendar printed per 2/27 los

## 2018-06-08 NOTE — Progress Notes (Signed)
Hematology and Oncology Follow Up Visit  Connor [name] [id number] Mar 15, 1946 73 y.o. 06/08/2018   Principle Diagnosis:   Polycythemia vera-Triple negative  Chronic immune thrombocytopenia vs medication  Transient iron deficiency secondary to phlebotomies  Current Therapy:    Phlebotomy to maintain hematocrit below 45%  Aspirin 81 mg by mouth daily  IV iron as indicated - last dose given on 06/02/2016     Interim History:  Connor [name] is back for followup.  He is doing pretty well.  He was last seen back in December.  So far, he has had a fairly uneventful winter.  Because of all the rain, he is not been able to play golf.  He is donating blood to the [facility].  I think this is a very good idea for him.  His iron studies back in December showed a ferritin of 5 with an iron saturation of 29%.  He has had no cough.  There is been no rashes.  He has had no weight loss or weight gain.  He has had no headache.  Overall, his performance status is ECOG 0  Medications:  Current Outpatient Medications:  .  alfuzosin (UROXATRAL) 10 MG 24 hr tablet, Take 10 mg by mouth every morning. , Disp: , Rfl:  .  aspirin 81 MG tablet, Take 81 mg by mouth 2 (two) times daily. , Disp: , Rfl:  .  cetirizine (ZYRTEC) 10 MG tablet, Take 10 mg by mouth daily., Disp: , Rfl:  .  Cholecalciferol (VITAMIN D-3) 5000 UNITS TABS, Take 1 tablet by mouth daily. , Disp: , Rfl:  .  Coenzyme Q10 (CO Q 10 PO), Take 1 capsule by mouth daily. , Disp: , Rfl:  .  cyclobenzaprine (FLEXERIL) 10 MG tablet, Take 1 tablet (10 mg total) by mouth 3 (three) times daily as needed for muscle spasms., Disp: 30 tablet, Rfl: 3 .  dicyclomine (BENTYL) 20 MG tablet, Take 1 tablet (20 mg total) by mouth every 6 (six) hours. (Patient not taking: Reported on 04/07/2018), Disp: 30 tablet, Rfl: 2 .  docusate sodium (COLACE) 100 MG capsule, Take 200 mg by mouth daily as needed for mild constipation., Disp: , Rfl:  .   Glucosamine-Chondroit-Vit C-Mn (GLUCOSAMINE CHONDR 1500 COMPLX) CAPS, Take by mouth every morning., Disp: , Rfl:  .  Icosapent Ethyl (VASCEPA) 1 g CAPS, Take 2 capsules (2 g total) by mouth 2 (two) times daily., Disp: 360 capsule, Rfl: 3 .  Ipratropium-Albuterol (COMBIVENT) 20-100 MCG/ACT AERS respimat, Inhale 1 puff into the lungs as needed for wheezing or shortness of breath., Disp: 1 Inhaler, Rfl: 11 .  losartan (COZAAR) 50 MG tablet, Take 1 tablet (50 mg total) by mouth daily., Disp: 90 tablet, Rfl: 2 .  montelukast (SINGULAIR) 10 MG tablet, Take 1 tablet (10 mg total) by mouth at bedtime., Disp: 30 tablet, Rfl: 3 .  Multiple Vitamins-Minerals (CENTRUM SILVER ULTRA MENS) TABS, Take 1 tablet by mouth every morning. Once a day, Disp: , Rfl:  .  ondansetron (ZOFRAN-ODT) 8 MG disintegrating tablet, TAKE ONE TABLET BY MOUTH EVERY 8 HOURS AS NEEDED FOR NAUSEA OR VOMITING (Patient not taking: Reported on 04/07/2018), Disp: 20 tablet, Rfl: 0 .  polyethylene glycol (MIRALAX / GLYCOLAX) packet, Take 17 g by mouth as needed. , Disp: , Rfl:  .  Probiotic Product (PHILLIPS COLON HEALTH PO), Take 1 capsule by mouth daily. , Disp: , Rfl:  .  pyridOXINE (VITAMIN B-6) 100 MG tablet, Take 100 mg by mouth  daily. , Disp: , Rfl:  .  rosuvastatin (CRESTOR) 10 MG tablet, TAKE 1 TABLET (10 MG TOTAL) BY MOUTH DAILY., Disp: 90 tablet, Rfl: 3 .  sildenafil (REVATIO) 20 MG tablet, Take 2-5 tabs prior to sexual activity (Patient not taking: Reported on 04/07/2018), Disp: 50 tablet, Rfl: 1 .  Turmeric 500 MG CAPS, Take by mouth every morning., Disp: , Rfl:  .  vitamin B-12 (CYANOCOBALAMIN) 500 MCG tablet, Take 500 mcg by mouth daily., Disp: , Rfl:   Allergies:  Allergies  Allergen Reactions  . Dexlansoprazole Nausea And Vomiting  . Celebrex [Celecoxib]     Solid white - generic pill only = caused diarrhea (other generics ok)     Past Medical History, Surgical history, Social history, and Family History were reviewed  and updated.  Review of Systems: Review of Systems  Constitutional: Negative.   HENT: Negative.   Eyes: Negative.   Respiratory: Negative.   Cardiovascular: Negative.   Gastrointestinal: Negative.   Genitourinary: Negative.   Musculoskeletal: Negative.   Skin: Negative.   Neurological: Negative.   Endo/Heme/Allergies: Negative.   Psychiatric/Behavioral: Negative.  Negative for depression.     Physical Exam:  weight is 239 lb 4 oz (108.5 kg). His oral temperature is 98.3 F (36.8 C). His blood pressure is 140/74 and his pulse is 81. His respiration is 18 and oxygen saturation is 96%.  Saturday and Sunday in [place] and on he was in [place] over the next couple weeks I Connor [name].  [place]  Physical Exam Vitals signs reviewed.  HENT:     Head: Normocephalic and atraumatic.  Eyes:     Pupils: Pupils are equal, round, and reactive to light.  Neck:     Musculoskeletal: Normal range of motion.  Cardiovascular:     Rate and Rhythm: Normal rate and regular rhythm.     Heart sounds: Normal heart sounds.  Pulmonary:     Effort: Pulmonary effort is normal.     Breath sounds: Normal breath sounds.  Abdominal:     General: Bowel sounds are normal.     Palpations: Abdomen is soft.  Musculoskeletal: Normal range of motion.        General: No tenderness or deformity.  Lymphadenopathy:     Cervical: No cervical adenopathy.  Skin:    General: Skin is warm and dry.     Findings: No erythema or rash.  Neurological:     Mental Status: He is alert and oriented to person, place, and time.  Psychiatric:        Behavior: Behavior normal.        Thought Content: Thought content normal.        Judgment: Judgment normal.     Lab Results  Component Value Date   WBC 6.1 06/08/2018   HGB 13.7 06/08/2018   HCT 43.2 06/08/2018   MCV 85.9 06/08/2018   PLT 133 (L) 06/08/2018     Chemistry      Component Value Date/Time   NA 143 06/08/2018 0859   NA 147 (H)  11/29/2017 1015   NA 139 04/13/2017 1150   NA 141 07/09/2016 1038   K 4.4 06/08/2018 0859   K 4.0 04/13/2017 1150   K 4.0 07/09/2016 1038   CL 107 06/08/2018 0859   CL 104 04/13/2017 1150   CO2 30 06/08/2018 0859   CO2 26 04/13/2017 1150   CO2 22 07/09/2016 1038   BUN 18 06/08/2018 0859   BUN 14  11/29/2017 1015   BUN 17 04/13/2017 1150   BUN 19.0 07/09/2016 1038   CREATININE 1.00 06/08/2018 0859   CREATININE 1.0 04/13/2017 1150   CREATININE 0.9 07/09/2016 1038      Component Value Date/Time   CALCIUM 9.4 06/08/2018 0859   CALCIUM 9.4 04/13/2017 1150   CALCIUM 9.5 07/09/2016 1038   ALKPHOS 57 06/08/2018 0859   ALKPHOS 64 04/13/2017 1150   ALKPHOS 85 07/09/2016 1038   AST 18 06/08/2018 0859   AST 22 07/09/2016 1038   ALT 15 06/08/2018 0859   ALT 26 04/13/2017 1150   ALT 22 07/09/2016 1038   BILITOT 0.6 06/08/2018 0859   BILITOT 0.62 07/09/2016 1038         Impression and Plan: Connor [name] is a 73 year old gentleman with polycythemia.  Thankfully, we do not have to phlebotomize him.  I think is great that he goes to the [facility].  I do not see any problems with him doing this.  I will plan to see him back in 6 weeks.  For right now, I do not see that we did make any other changes.  Volanda Napoleon, MD 2/27/20209:48 AM

## 2018-06-26 ENCOUNTER — Telehealth: Payer: Self-pay | Admitting: Family Medicine

## 2018-06-26 ENCOUNTER — Other Ambulatory Visit: Payer: Self-pay

## 2018-06-26 DIAGNOSIS — D696 Thrombocytopenia, unspecified: Secondary | ICD-10-CM

## 2018-06-26 MED ORDER — IPRATROPIUM-ALBUTEROL 20-100 MCG/ACT IN AERS: 1.0000 | INHALATION_SPRAY | RESPIRATORY_TRACT | 0 refills | Status: DC | PRN

## 2018-06-26 NOTE — Telephone Encounter (Signed)
done

## 2018-06-26 NOTE — Telephone Encounter (Signed)
What is the name of the medication? Ipratropium-Albuterol 20-100   Have you contacted your pharmacy to request a refill? Yes  Which pharmacy would you like this sent to? [facility]   Patient notified that their request is being sent to the clinical staff for review and that they should receive a call once it is complete. If they do not receive a call within 24 hours they can check with their pharmacy or our office.

## 2018-06-30 DIAGNOSIS — H5203 Hypermetropia, bilateral: Secondary | ICD-10-CM | POA: Diagnosis not present

## 2018-06-30 DIAGNOSIS — H2513 Age-related nuclear cataract, bilateral: Secondary | ICD-10-CM | POA: Diagnosis not present

## 2018-06-30 DIAGNOSIS — H52203 Unspecified astigmatism, bilateral: Secondary | ICD-10-CM | POA: Diagnosis not present

## 2018-07-18 ENCOUNTER — Telehealth: Payer: Self-pay

## 2018-07-18 NOTE — Telephone Encounter (Signed)
Received VM from pt's wife Hoyle Sauer stating that pt has been very tired since last phlebotomy and questions if pt could come in for IV iron or try taking oral iron.   Per Judson Roch, NP she does not wish to order iron at this time as his counts should naturally rise progressively over time.   VM left on personalized VM with this information & to contact office for questions & concerns. dph

## 2018-07-19 DIAGNOSIS — R972 Elevated prostate specific antigen [PSA]: Secondary | ICD-10-CM | POA: Diagnosis not present

## 2018-07-26 DIAGNOSIS — R351 Nocturia: Secondary | ICD-10-CM | POA: Diagnosis not present

## 2018-07-26 DIAGNOSIS — R350 Frequency of micturition: Secondary | ICD-10-CM | POA: Diagnosis not present

## 2018-08-16 ENCOUNTER — Encounter

## 2018-08-16 ENCOUNTER — Telehealth: Payer: Medicare Other | Admitting: Cardiovascular Disease

## 2018-08-28 ENCOUNTER — Other Ambulatory Visit: Payer: Self-pay | Admitting: *Deleted

## 2018-08-28 MED ORDER — MONTELUKAST SODIUM 10 MG PO TABS: 10.0000 mg | ORAL_TABLET | Freq: Every day | ORAL | 0 refills | Status: DC

## 2018-08-30 DIAGNOSIS — L089 Local infection of the skin and subcutaneous tissue, unspecified: Secondary | ICD-10-CM | POA: Diagnosis not present

## 2018-09-07 ENCOUNTER — Encounter: Payer: Self-pay | Admitting: Hematology & Oncology

## 2018-09-07 ENCOUNTER — Inpatient Hospital Stay: Payer: Medicare Other

## 2018-09-07 ENCOUNTER — Other Ambulatory Visit: Payer: Self-pay

## 2018-09-07 ENCOUNTER — Inpatient Hospital Stay: Payer: Medicare Other | Attending: Hematology & Oncology | Admitting: Hematology & Oncology

## 2018-09-07 VITALS — BP 125/66 | HR 77 | Temp 97.6°F | Resp 18 | Wt 236.0 lb

## 2018-09-07 DIAGNOSIS — K909 Intestinal malabsorption, unspecified: Secondary | ICD-10-CM

## 2018-09-07 DIAGNOSIS — D45 Polycythemia vera: Secondary | ICD-10-CM | POA: Insufficient documentation

## 2018-09-07 LAB — CBC WITH DIFFERENTIAL (CANCER CENTER ONLY)
Abs Immature Granulocytes: 0.03 10*3/uL (ref 0.00–0.07)
Basophils Absolute: 0 10*3/uL (ref 0.0–0.1)
Basophils Relative: 1 %
Eosinophils Absolute: 0.1 10*3/uL (ref 0.0–0.5)
Eosinophils Relative: 2 %
HCT: 44.5 % (ref 39.0–52.0)
Hemoglobin: 13.6 g/dL (ref 13.0–17.0)
Immature Granulocytes: 1 %
Lymphocytes Relative: 17 %
Lymphs Abs: 0.9 10*3/uL (ref 0.7–4.0)
MCH: 26.1 pg (ref 26.0–34.0)
MCHC: 30.6 g/dL (ref 30.0–36.0)
MCV: 85.2 fL (ref 80.0–100.0)
Monocytes Absolute: 0.4 10*3/uL (ref 0.1–1.0)
Monocytes Relative: 8 %
Neutro Abs: 3.9 10*3/uL (ref 1.7–7.7)
Neutrophils Relative %: 71 %
Platelet Count: 123 10*3/uL — ABNORMAL LOW (ref 150–400)
RBC: 5.22 MIL/uL (ref 4.22–5.81)
RDW: 15.5 % (ref 11.5–15.5)
WBC Count: 5.4 10*3/uL (ref 4.0–10.5)
nRBC: 0 % (ref 0.0–0.2)

## 2018-09-07 LAB — CMP (CANCER CENTER ONLY)
ALT: 14 U/L (ref 0–44)
AST: 19 U/L (ref 15–41)
Albumin: 4.3 g/dL (ref 3.5–5.0)
Alkaline Phosphatase: 57 U/L (ref 38–126)
Anion gap: 7 (ref 5–15)
BUN: 20 mg/dL (ref 8–23)
CO2: 25 mmol/L (ref 22–32)
Calcium: 8.7 mg/dL — ABNORMAL LOW (ref 8.9–10.3)
Chloride: 110 mmol/L (ref 98–111)
Creatinine: 0.97 mg/dL (ref 0.61–1.24)
GFR, Est AFR Am: >60 mL/min (ref >60)
GFR, Estimated: >60 mL/min (ref >60)
Glucose, Bld: 135 mg/dL — ABNORMAL HIGH (ref 70–99)
Potassium: 3.7 mmol/L (ref 3.5–5.1)
Sodium: 142 mmol/L (ref 135–145)
Total Bilirubin: 0.7 mg/dL (ref 0.3–1.2)
Total Protein: 6.4 g/dL — ABNORMAL LOW (ref 6.5–8.1)

## 2018-09-07 LAB — IRON AND TIBC
Iron: 182 ug/dL — ABNORMAL HIGH (ref 42–163)
Saturation Ratios: 49 % (ref 20–55)
TIBC: 370 ug/dL (ref 202–409)
UIBC: 188 ug/dL (ref 117–376)

## 2018-09-07 LAB — FERRITIN: Ferritin: 7 ng/mL — ABNORMAL LOW (ref 24–336)

## 2018-09-07 NOTE — Progress Notes (Signed)
Hematology and Oncology Follow Up Visit  Connor [name] [id number] 02-09-1946 73 y.o. 09/07/2018   Principle Diagnosis:   Polycythemia vera-Triple negative  Chronic immune thrombocytopenia vs medication  Transient iron deficiency secondary to phlebotomies  Current Therapy:    Phlebotomy to maintain hematocrit below 45%  Aspirin 81 mg by mouth daily  IV iron as indicated - last dose given on 06/02/2016     Interim History:  [name] is back for followup.  He is doing pretty well.  He is managing the coronavirus without too many difficulties.  He has had no problems with fever.  He has had no cough.  He has had no bleeding.  There is been no change in bowel or bladder habits.  We last saw him in February, his ferritin was 6 with an iron saturation of 13%.  Overall, his performance status is ECOG 0  Medications:  Current Outpatient Medications:  .  ferrous sulfate 325 (65 FE) MG tablet, Take 325 mg by mouth daily with breakfast., Disp: , Rfl:  .  alfuzosin (UROXATRAL) 10 MG 24 hr tablet, Take 10 mg by mouth every morning. , Disp: , Rfl:  .  aspirin 81 MG tablet, Take 81 mg by mouth 2 (two) times daily. , Disp: , Rfl:  .  cetirizine (ZYRTEC) 10 MG tablet, Take 10 mg by mouth daily., Disp: , Rfl:  .  Cholecalciferol (VITAMIN D-3) 5000 UNITS TABS, Take 1 tablet by mouth daily. , Disp: , Rfl:  .  Coenzyme Q10 (CO Q 10 PO), Take 1 capsule by mouth daily. , Disp: , Rfl:  .  cyclobenzaprine (FLEXERIL) 10 MG tablet, Take 1 tablet (10 mg total) by mouth 3 (three) times daily as needed for muscle spasms., Disp: 30 tablet, Rfl: 3 .  dicyclomine (BENTYL) 20 MG tablet, Take 1 tablet (20 mg total) by mouth every 6 (six) hours. (Patient not taking: Reported on 04/07/2018), Disp: 30 tablet, Rfl: 2 .  docusate sodium (COLACE) 100 MG capsule, Take 200 mg by mouth daily as needed for mild constipation., Disp: , Rfl:  .  Glucosamine-Chondroit-Vit C-Mn (GLUCOSAMINE CHONDR 1500 COMPLX) CAPS, Take  by mouth every morning., Disp: , Rfl:  .  Icosapent Ethyl (VASCEPA) 1 g CAPS, Take 2 capsules (2 g total) by mouth 2 (two) times daily., Disp: 360 capsule, Rfl: 3 .  Ipratropium-Albuterol (COMBIVENT) 20-100 MCG/ACT AERS respimat, Inhale 1 puff into the lungs as needed for wheezing or shortness of breath., Disp: 1 Inhaler, Rfl: 0 .  losartan (COZAAR) 50 MG tablet, Take 1 tablet (50 mg total) by mouth daily., Disp: 90 tablet, Rfl: 2 .  montelukast (SINGULAIR) 10 MG tablet, Take 1 tablet (10 mg total) by mouth at bedtime., Disp: 90 tablet, Rfl: 0 .  Multiple Vitamins-Minerals (CENTRUM SILVER ULTRA MENS) TABS, Take 1 tablet by mouth every morning. Once a day, Disp: , Rfl:  .  ondansetron (ZOFRAN-ODT) 8 MG disintegrating tablet, TAKE ONE TABLET BY MOUTH EVERY 8 HOURS AS NEEDED FOR NAUSEA OR VOMITING (Patient not taking: Reported on 04/07/2018), Disp: 20 tablet, Rfl: 0 .  polyethylene glycol (MIRALAX / GLYCOLAX) packet, Take 17 g by mouth as needed. , Disp: , Rfl:  .  Probiotic Product (PHILLIPS COLON HEALTH PO), Take 1 capsule by mouth daily. , Disp: , Rfl:  .  pyridOXINE (VITAMIN B-6) 100 MG tablet, Take 100 mg by mouth daily. , Disp: , Rfl:  .  rosuvastatin (CRESTOR) 10 MG tablet, TAKE 1 TABLET (10 MG TOTAL)  BY MOUTH DAILY., Disp: 90 tablet, Rfl: 3 .  sildenafil (REVATIO) 20 MG tablet, Take 2-5 tabs prior to sexual activity (Patient not taking: Reported on 04/07/2018), Disp: 50 tablet, Rfl: 1 .  Turmeric 500 MG CAPS, Take by mouth every morning., Disp: , Rfl:  .  vitamin B-12 (CYANOCOBALAMIN) 500 MCG tablet, Take 500 mcg by mouth daily., Disp: , Rfl:   Allergies:  Allergies  Allergen Reactions  . Dexlansoprazole Nausea And Vomiting  . Celecoxib Other (See Comments)    Solid white - generic pill only = caused diarrhea (other generics ok)  Solid white - generic pill only = caused diarrhea (other generics ok)     Past Medical History, Surgical history, Social history, and Family History were  reviewed and updated.  Review of Systems: Review of Systems  Constitutional: Negative.   HENT: Negative.   Eyes: Negative.   Respiratory: Negative.   Cardiovascular: Negative.   Gastrointestinal: Negative.   Genitourinary: Negative.   Musculoskeletal: Negative.   Skin: Negative.   Neurological: Negative.   Endo/Heme/Allergies: Negative.   Psychiatric/Behavioral: Negative.  Negative for depression.     Physical Exam:  weight is 236 lb (107 kg). His oral temperature is 97.6 F (36.4 C). His blood pressure is 125/66 and his pulse is 77. His respiration is 18 and oxygen saturation is 98%.  Saturday and Sunday in [place] and on he was in [place] over the next couple weeks I Connor [name].  [place]  Physical Exam Vitals signs reviewed.  HENT:     Head: Normocephalic and atraumatic.  Eyes:     Pupils: Pupils are equal, round, and reactive to light.  Neck:     Musculoskeletal: Normal range of motion.  Cardiovascular:     Rate and Rhythm: Normal rate and regular rhythm.     Heart sounds: Normal heart sounds.  Pulmonary:     Effort: Pulmonary effort is normal.     Breath sounds: Normal breath sounds.  Abdominal:     General: Bowel sounds are normal.     Palpations: Abdomen is soft.  Musculoskeletal: Normal range of motion.        General: No tenderness or deformity.  Lymphadenopathy:     Cervical: No cervical adenopathy.  Skin:    General: Skin is warm and dry.     Findings: No erythema or rash.  Neurological:     Mental Status: He is alert and oriented to person, place, and time.  Psychiatric:        Behavior: Behavior normal.        Thought Content: Thought content normal.        Judgment: Judgment normal.     Lab Results  Component Value Date   WBC 5.4 09/07/2018   HGB 13.6 09/07/2018   HCT 44.5 09/07/2018   MCV 85.2 09/07/2018   PLT 123 (L) 09/07/2018     Chemistry      Component Value Date/Time   NA 142 09/07/2018 0857   NA 147 (H)  11/29/2017 1015   NA 139 04/13/2017 1150   NA 141 07/09/2016 1038   K 3.7 09/07/2018 0857   K 4.0 04/13/2017 1150   K 4.0 07/09/2016 1038   CL 110 09/07/2018 0857   CL 104 04/13/2017 1150   CO2 25 09/07/2018 0857   CO2 26 04/13/2017 1150   CO2 22 07/09/2016 1038   BUN 20 09/07/2018 0857   BUN 14 11/29/2017 1015   BUN 17 04/13/2017  1150   BUN 19.0 07/09/2016 1038   CREATININE 0.97 09/07/2018 0857   CREATININE 1.0 04/13/2017 1150   CREATININE 0.9 07/09/2016 1038      Component Value Date/Time   CALCIUM 8.7 (L) 09/07/2018 0857   CALCIUM 9.4 04/13/2017 1150   CALCIUM 9.5 07/09/2016 1038   ALKPHOS 57 09/07/2018 0857   ALKPHOS 64 04/13/2017 1150   ALKPHOS 85 07/09/2016 1038   AST 19 09/07/2018 0857   AST 22 07/09/2016 1038   ALT 14 09/07/2018 0857   ALT 26 04/13/2017 1150   ALT 22 07/09/2016 1038   BILITOT 0.7 09/07/2018 0857   BILITOT 0.62 07/09/2016 1038         Impression and Plan: Mr. Paris is a 73 year old gentleman with polycythemia.  Thankfully, we do not have to phlebotomize him.  His church has had a blood drive today.  He is not going donate blood today.  I think this is wonderful.   I will plan to see him back in 3 months.  Again, I have no problems with him donating blood if necessary.  For right now, I do not see that we did make any other changes.  Volanda Napoleon, MD 5/28/202010:40 AM

## 2018-09-18 ENCOUNTER — Other Ambulatory Visit: Payer: Self-pay

## 2018-09-18 ENCOUNTER — Other Ambulatory Visit: Payer: Medicare Other

## 2018-09-18 DIAGNOSIS — D696 Thrombocytopenia, unspecified: Secondary | ICD-10-CM | POA: Diagnosis not present

## 2018-09-18 DIAGNOSIS — E559 Vitamin D deficiency, unspecified: Secondary | ICD-10-CM

## 2018-09-18 DIAGNOSIS — E7800 Pure hypercholesterolemia, unspecified: Secondary | ICD-10-CM | POA: Diagnosis not present

## 2018-09-18 DIAGNOSIS — I10 Essential (primary) hypertension: Secondary | ICD-10-CM

## 2018-09-19 LAB — BMP8+EGFR
BUN/Creatinine Ratio: 17 (ref 10–24)
BUN: 16 mg/dL (ref 8–27)
CO2: 25 mmol/L (ref 20–29)
Calcium: 9.2 mg/dL (ref 8.6–10.2)
Chloride: 107 mmol/L — ABNORMAL HIGH (ref 96–106)
Creatinine, Ser: 0.96 mg/dL (ref 0.76–1.27)
GFR calc Af Amer: 90 mL/min/{1.73_m2} (ref >59)
GFR calc non Af Amer: 78 mL/min/{1.73_m2} (ref >59)
Glucose: 119 mg/dL — ABNORMAL HIGH (ref 65–99)
Potassium: 4.5 mmol/L (ref 3.5–5.2)
Sodium: 143 mmol/L (ref 134–144)

## 2018-09-19 LAB — CBC WITH DIFFERENTIAL/PLATELET
Basophils Absolute: 0 10*3/uL (ref 0.0–0.2)
Basos: 1 %
EOS (ABSOLUTE): 0.1 10*3/uL (ref 0.0–0.4)
Eos: 3 %
Hematocrit: 46 % (ref 37.5–51.0)
Hemoglobin: 14.5 g/dL (ref 13.0–17.7)
Immature Grans (Abs): 0 10*3/uL (ref 0.0–0.1)
Immature Granulocytes: 0 %
Lymphocytes Absolute: 1.1 10*3/uL (ref 0.7–3.1)
Lymphs: 22 %
MCH: 26.2 pg — ABNORMAL LOW (ref 26.6–33.0)
MCHC: 31.5 g/dL (ref 31.5–35.7)
MCV: 83 fL (ref 79–97)
Monocytes Absolute: 0.3 10*3/uL (ref 0.1–0.9)
Monocytes: 7 %
Neutrophils Absolute: 3.2 10*3/uL (ref 1.4–7.0)
Neutrophils: 67 %
Platelets: 129 10*3/uL — ABNORMAL LOW (ref 150–450)
RBC: 5.53 x10E6/uL (ref 4.14–5.80)
RDW: 15 % (ref 11.6–15.4)
WBC: 4.8 10*3/uL (ref 3.4–10.8)

## 2018-09-19 LAB — HEPATIC FUNCTION PANEL
ALT: 13 IU/L (ref 0–44)
AST: 20 IU/L (ref 0–40)
Albumin: 4.1 g/dL (ref 3.7–4.7)
Alkaline Phosphatase: 64 IU/L (ref 39–117)
Bilirubin Total: 0.5 mg/dL (ref 0.0–1.2)
Bilirubin, Direct: 0.17 mg/dL (ref 0.00–0.40)
Total Protein: 5.9 g/dL — ABNORMAL LOW (ref 6.0–8.5)

## 2018-09-19 LAB — LIPID PANEL
Chol/HDL Ratio: 3.1 ratio (ref 0.0–5.0)
Cholesterol, Total: 93 mg/dL — ABNORMAL LOW (ref 100–199)
HDL: 30 mg/dL — ABNORMAL LOW (ref >39)
LDL Calculated: 43 mg/dL (ref 0–99)
Triglycerides: 99 mg/dL (ref 0–149)
VLDL Cholesterol Cal: 20 mg/dL (ref 5–40)

## 2018-09-19 LAB — VITAMIN D 25 HYDROXY (VIT D DEFICIENCY, FRACTURES): Vit D, 25-Hydroxy: 58.6 ng/mL (ref 30.0–100.0)

## 2018-09-20 ENCOUNTER — Other Ambulatory Visit: Payer: Self-pay | Admitting: *Deleted

## 2018-09-20 DIAGNOSIS — D696 Thrombocytopenia, unspecified: Secondary | ICD-10-CM

## 2018-09-20 MED ORDER — FERROUS SULFATE 325 (65 FE) MG PO TABS: 325.0000 mg | ORAL_TABLET | Freq: Every day | ORAL | 3 refills | Status: DC

## 2018-09-20 MED ORDER — ICOSAPENT ETHYL 1 G PO CAPS: 2.0000 | ORAL_CAPSULE | Freq: Two times a day (BID) | ORAL | 3 refills | Status: DC

## 2018-09-20 MED ORDER — ROSUVASTATIN CALCIUM 10 MG PO TABS: ORAL_TABLET | ORAL | 3 refills | Status: DC

## 2018-09-20 MED ORDER — LOSARTAN POTASSIUM 50 MG PO TABS: 50.0000 mg | ORAL_TABLET | Freq: Every day | ORAL | 3 refills | Status: DC

## 2018-09-20 MED ORDER — MONTELUKAST SODIUM 10 MG PO TABS: 10.0000 mg | ORAL_TABLET | Freq: Every day | ORAL | 3 refills | Status: DC

## 2018-09-20 MED ORDER — IPRATROPIUM-ALBUTEROL 20-100 MCG/ACT IN AERS: 1.0000 | INHALATION_SPRAY | RESPIRATORY_TRACT | 3 refills | Status: DC | PRN

## 2018-09-26 ENCOUNTER — Ambulatory Visit: Payer: Medicare Other | Admitting: Family Medicine

## 2018-10-18 DIAGNOSIS — Z85828 Personal history of other malignant neoplasm of skin: Secondary | ICD-10-CM | POA: Diagnosis not present

## 2018-10-18 DIAGNOSIS — D225 Melanocytic nevi of trunk: Secondary | ICD-10-CM | POA: Diagnosis not present

## 2018-10-18 DIAGNOSIS — L570 Actinic keratosis: Secondary | ICD-10-CM | POA: Diagnosis not present

## 2018-10-18 DIAGNOSIS — L814 Other melanin hyperpigmentation: Secondary | ICD-10-CM | POA: Diagnosis not present

## 2018-10-18 DIAGNOSIS — D2372 Other benign neoplasm of skin of left lower limb, including hip: Secondary | ICD-10-CM | POA: Diagnosis not present

## 2018-10-18 DIAGNOSIS — L821 Other seborrheic keratosis: Secondary | ICD-10-CM | POA: Diagnosis not present

## 2018-10-18 DIAGNOSIS — I788 Other diseases of capillaries: Secondary | ICD-10-CM | POA: Diagnosis not present

## 2018-10-18 DIAGNOSIS — D1801 Hemangioma of skin and subcutaneous tissue: Secondary | ICD-10-CM | POA: Diagnosis not present

## 2018-10-31 ENCOUNTER — Telehealth: Payer: Medicare Other | Admitting: Cardiovascular Disease

## 2018-12-05 ENCOUNTER — Ambulatory Visit: Payer: Medicare Other | Admitting: Cardiology

## 2018-12-05 ENCOUNTER — Encounter: Payer: Self-pay | Admitting: Cardiology

## 2018-12-05 ENCOUNTER — Ambulatory Visit (INDEPENDENT_AMBULATORY_CARE_PROVIDER_SITE_OTHER): Payer: Medicare Other | Admitting: General Practice

## 2018-12-05 ENCOUNTER — Other Ambulatory Visit: Payer: Self-pay

## 2018-12-05 VITALS — BP 140/88 | HR 71 | Temp 97.0°F | Ht 71.5 in | Wt 238.0 lb

## 2018-12-05 DIAGNOSIS — D751 Secondary polycythemia: Secondary | ICD-10-CM

## 2018-12-05 DIAGNOSIS — I10 Essential (primary) hypertension: Secondary | ICD-10-CM

## 2018-12-05 DIAGNOSIS — G4733 Obstructive sleep apnea (adult) (pediatric): Secondary | ICD-10-CM | POA: Diagnosis not present

## 2018-12-05 DIAGNOSIS — E785 Hyperlipidemia, unspecified: Secondary | ICD-10-CM

## 2018-12-05 DIAGNOSIS — E669 Obesity, unspecified: Secondary | ICD-10-CM

## 2018-12-05 NOTE — Patient Instructions (Signed)
Medication Instructions:  Your physician recommends that you continue on your current medications as directed. Please refer to the Current Medication list given to you today. If you need a refill on your cardiac medications before your next appointment, please call your pharmacy.   Lab work: None  If you have labs (blood work) drawn today and your tests are completely normal, you will receive your results only by: [name] MyChart Message (if you have MyChart) OR . A paper copy in the mail If you have any lab test that is abnormal or we need to change your treatment, we will call you to review the results.  Testing/Procedures: None   Follow-Up: At [facility], you and your health needs are our priority.  As part of our continuing mission to provide you with exceptional heart care, we have created designated Provider Care Teams.  These Care Teams include your primary Cardiologist (physician) and Advanced Practice Providers (APPs -  Physician Assistants and Nurse Practitioners) who all work together to provide you with the care you need, when you need it. You will need a follow up appointment in 12 months.  Please call our office 2 months in advance to schedule this appointment.  You may see Shelva Majestic, MD or one of the following Advanced Practice Providers on your designated Care Team: [place], [place] . Fabian Sharp, PA-C  Any Other Special Instructions Will Be Listed Below (If Applicable).

## 2018-12-05 NOTE — Progress Notes (Signed)
Cardiology Clinic Note   Patient Name: Connor [name] Date of Encounter: 12/05/2018  Primary Care Provider:  Dettinger, Fransisca Kaufmann, MD Primary Cardiologist:  Connor Majestic, MD  Patient Profile    Connor [name] 73 year old male presents for follow-up of his hypertension, hyperlipidemia, and obstructive sleep apnea.  Past Medical History    Past Medical History:  Diagnosis Date   Anemia    BPH (benign prostatic hyperplasia)    Cancer (HCC)    squamous cell on nose , inside nose    Cardiomegaly    Carotid artery stenosis    COPD, mild ([place]) 05/26/2007   PFT 09/09/11>>FEV1 3.06 (95%), FEV1% 69, FEF 25-75% 1.70 (59%), TLC 7.67 (109%), DLCO 108%, no BD     Cough 05/26/2007   Erectile dysfunction    GERD (gastroesophageal reflux disease)    H/O vitamin D deficiency    History of nuclear stress test    this revealed normal myocardial perfusion and function. Post stress EF was 57%. There was no region of scar or ischemia.   Hx of echocardiogram 02/15/2012   This showed mild concentric LVH. EF > 55%. H edid have grade 1 diastolic dysfunction with mitral valve E:A ratio of 0.81, his left atrium was mildly dilated by volume assessment at 33.3 mL/m2. He did have mild tricupid regurgitation with upper normal RV systolic pressure at [id number]. He had aortic valve sclerosis without stenosis.   Hyperlipemia    Hypertension    Iron deficiency anemia, unspecified 09/01/2012   Iron malabsorption 05/28/2016   Nephrolithiasis    stones    Peyronie's disease    Polycythemia vera(238.4) 04/19/2013   Shortness of breath    with exertion    Sleep apnea    Cpap-    Thrombocytopenia (HCC)    Past Surgical History:  Procedure Laterality Date   APPENDECTOMY     EYE SURGERY Bilateral    eye lid surgery   HIATAL HERNIA REPAIR  11/24/2011   Procedure: LAPAROSCOPIC REPAIR OF HIATAL HERNIA;  Surgeon: Pedro Earls, MD;  Location: [facility];  Service: General;;   KNEE SURGERY      right   LAPAROSCOPIC NISSEN FUNDOPLICATION  [id number]   Procedure: LAPAROSCOPIC NISSEN FUNDOPLICATION;  Surgeon: Pedro Earls, MD;  Location: [facility];  Service: General;  Laterality: N/A;   NOSE SURGERY  2013   SHOULDER SURGERY     left    Allergies  Allergies  Allergen Reactions   Dexlansoprazole Nausea And Vomiting   Celecoxib Other (See Comments)    Solid white - generic pill only = caused diarrhea (other generics ok)  Solid white - generic pill only = caused diarrhea (other generics ok)     History of Present Illness    Connor [name] was last seen by Dr. Claiborne Billings on 07/04/2017.  During that time he was doing well.  He was compliant with his CPAP and felt like his sleep was restorative.  He had no complaints of chest pain, orthopnea, or PND at that time.  His hyperlipidemia was well controlled with Zetia and Crestor.  There is a strong history of coronary artery disease in his family and his 2 brothers have both had coronary artery stenting.  An echocardiogram in November 2013 showed an EF of greater than [id number], grade 1 diastolic dysfunction mild left atrial enlargement, and mild aortic valve sclerosis.  His carotid duplex study was normal.  Also his myocardial perfusion study on 04/26/2005 was normal.  His PMH also  includes COPD, OSA, and now absorption, diverticulosis, BPH, lap Nissen fundoplication [id number], thrombocytopenia, vitamin D deficiency, iron deficiency anemia, family history of coronary artery disease, polycythemia vera, and hyperlipidemia.  He presents to the clinic today and states he has felt well over the last year.  He continues to be active outside push mowing multiple lawns in his neighborhood. He is playing 18 holes of golf about 2 times per week and also lifts free weights a couple times a week.  He states that several years ago he learned that Crestor was impacting his thrombocytopenia, he discontinued use, and has subsequently been taking only 10 mg of rosuvastatin with  a stable platelet count in the 120-140 range over the last year.  His recent September 18, 2018 lab work was stable.  He also states he has been compliant with his CPAP.  He denies chest pain, shortness of breath, lower extremity edema, fatigue, palpitations, melena, hematuria, hemoptysis, diaphoresis, weakness, presyncope, syncope, orthopnea, and PND.  Home Medications    Prior to Admission medications   Medication Sig Start Date End Date Taking? Authorizing Provider  alfuzosin (UROXATRAL) 10 MG 24 hr tablet Take 10 mg by mouth every morning.    Yes [provider]  aspirin 81 MG tablet Take 81 mg by mouth 2 (two) times daily.    Yes [provider]  cetirizine (ZYRTEC) 10 MG tablet Take 10 mg by mouth daily.   Yes [provider]  Cholecalciferol (VITAMIN D-3) 5000 UNITS TABS Take 1 tablet by mouth daily.    Yes [provider]  Coenzyme Q10 (CO Q 10 PO) Take 1 capsule by mouth daily.    Yes [provider]  cyclobenzaprine (FLEXERIL) 10 MG tablet Take 1 tablet (10 mg total) by mouth 3 (three) times daily as needed for muscle spasms. 11/08/17  Yes [name], MD  dicyclomine (BENTYL) 20 MG tablet Take 1 tablet (20 mg total) by mouth every 6 (six) hours. 12/27/17  Yes Levin Erp, PA  docusate sodium (COLACE) 100 MG capsule Take 200 mg by mouth daily as needed for mild constipation.   Yes [provider]  ferrous sulfate 325 (65 FE) MG tablet Take 1 tablet (325 mg total) by mouth daily with breakfast. 09/20/18  Yes [name], MD  Glucosamine-Chondroit-Vit C-Mn (GLUCOSAMINE CHONDR 1500 COMPLX) CAPS Take by mouth every morning.   Yes [provider]  Icosapent Ethyl (VASCEPA) 1 g CAPS Take 2 capsules (2 g total) by mouth 2 (two) times daily. 09/20/18  Yes [name], MD  Ipratropium-Albuterol (COMBIVENT) 20-100 MCG/ACT AERS respimat Inhale 1 puff into the lungs as needed for wheezing or shortness of breath. 09/20/18   Yes [name], MD  losartan (COZAAR) 50 MG tablet Take 1 tablet (50 mg total) by mouth daily. 09/20/18  Yes [name], MD  montelukast (SINGULAIR) 10 MG tablet Take 1 tablet (10 mg total) by mouth at bedtime. 09/20/18  Yes [name], MD  Multiple Vitamins-Minerals (CENTRUM SILVER ULTRA MENS) TABS Take 1 tablet by mouth every morning. Once a day   Yes [provider]  ondansetron (ZOFRAN-ODT) 8 MG disintegrating tablet TAKE ONE TABLET BY MOUTH EVERY 8 HOURS AS NEEDED FOR NAUSEA OR VOMITING 12/16/17  Yes [name], MD  polyethylene glycol [facility] / Floria Raveling) packet Take 17 g by mouth as needed.    Yes [provider]  Probiotic Product (Dora) Take 1 capsule by mouth daily.  Yes [provider]  pyridOXINE (VITAMIN B-6) 100 MG tablet Take 100 mg by mouth daily.    Yes [provider]  rosuvastatin (CRESTOR) 10 MG tablet TAKE 1 TABLET (10 MG TOTAL) BY MOUTH DAILY. 09/20/18  Yes [name], MD  sildenafil (REVATIO) 20 MG tablet Take 2-5 tabs prior to sexual activity 12/31/15  Yes [name], MD  Turmeric 500 MG CAPS Take by mouth every morning.   Yes [provider]  vitamin B-12 (CYANOCOBALAMIN) 500 MCG tablet Take 500 mcg by mouth daily.   Yes [provider]    Family History    Family History  Problem Relation Age of Onset   Heart disease Mother    Diabetes Mother    Heart disease Father    Heart disease Sister    Arthritis Sister    Diabetes Brother    Hypertension Brother    Heart attack Brother        enlarged heart   Liver cancer Paternal Grandfather    Throat cancer Maternal Grandfather    Hemachromatosis Daughter    Arthritis Daughter    GI problems Daughter    Thyroid disease Daughter    Hypertension Daughter    Heart disease Paternal Grandmother    Thyroid disease Daughter        pituitary tumor   Migraines Daughter    Colon cancer Neg Hx     Esophageal cancer Neg Hx    Rectal cancer Neg Hx    Stomach cancer Neg Hx    He indicated that his mother is deceased. He indicated that his father is deceased. He indicated that both of his sisters are alive. He indicated that both of his brothers are alive. He indicated that the status of his maternal grandfather is unknown. He indicated that the status of his paternal grandmother is unknown. He indicated that the status of his paternal grandfather is unknown. He indicated that both of his daughters are alive. He indicated that only one of his two sons is alive. He indicated that the status of his neg hx is unknown.  Social History    Social History   Socioeconomic History   Marital status: Married    Spouse name: Hoyle Sauer    Number of children: 3   Years of education: Not on file   Highest education level: Not on file  Occupational History   Occupation: retired    Fish farm manager: RETIRED    Comment: Research officer, trade union and gamble  Social Designer, fashion/clothing strain: Not on file   Food insecurity    Worry: Not on file    Inability: Not on file   Transportation needs    Medical: Not on file    Non-medical: Not on file  Tobacco Use   Smoking status: Former Smoker    Packs/day: 0.25    Years: 11.00    Pack years: 2.75    Types: Cigarettes    Start date: 05/17/1958    Quit date: 04/12/1966    Years since quitting: 52.6   Smokeless tobacco: Never Used   Tobacco comment: quit smoking in 1968  Substance and Sexual Activity   Alcohol use: Yes    Alcohol/week: 0.0 standard drinks    Comment: seldom   Drug use: No   Sexual activity: Not on file  Lifestyle   Physical activity    Days per week: Not on file    Minutes per session: Not on file   Stress: Not  on file  Relationships   Social connections    Talks on phone: Not on file    Gets together: Not on file    Attends religious service: Not on file    Active member of club or organization: Not on file    Attends  meetings of clubs or organizations: Not on file    Relationship status: Not on file   Intimate partner violence    Fear of current or ex partner: Not on file    Emotionally abused: Not on file    Physically abused: Not on file    Forced sexual activity: Not on file  Other Topics Concern   Not on file  Social History Narrative   Not on file     Review of Systems    General:  No chills, fever, night sweats or weight changes.  Cardiovascular:  No chest pain, dyspnea on exertion, edema, orthopnea, palpitations, paroxysmal nocturnal dyspnea. Dermatological: No rash, lesions/masses Respiratory: No cough, dyspnea Urologic: No hematuria, dysuria Abdominal:   No nausea, vomiting, diarrhea, bright red blood per rectum, melena, or hematemesis Neurologic:  No visual changes, wkns, changes in mental status. All other systems reviewed and are otherwise negative except as noted above.  Physical Exam    VS:  BP 140/88    Pulse 71    Temp (!) 97 F (36.1 C)    Ht 5' 11.5" (1.816 m)    Wt 238 lb (108 kg)    SpO2 97%    BMI 32.73 kg/m  , BMI Body mass index is 32.73 kg/m. GEN: Well nourished, well developed, in no acute distress. HEENT: normal. Neck: Supple, no JVD, carotid bruits, or masses. Cardiac: RRR, no murmurs, rubs, or gallops. No clubbing, cyanosis, edema.  Radials/DP/PT 2+ and equal bilaterally.  Respiratory:  Respirations regular and unlabored, clear to auscultation bilaterally. GI: Soft, nontender, nondistended, BS + x 4. MS: no deformity or atrophy. Skin: warm and dry, no rash. Neuro:  Strength and sensation are intact. Psych: Normal affect.  Accessory Clinical Findings    ECG personally reviewed by me today-normal sinus rhythm 71 bpm- No acute changes  EKG 07/04/2017 Normal sinus rhythm 77 bpm  EKG 05/06/2016 Normal sinus rhythm 83 bpm  Echocardiogram 02/11/2014 Study Conclusions   - Left ventricle: The cavity size was normal. Wall thickness was  increased in a  pattern of moderate LVH. Systolic function was  normal. The estimated ejection fraction was in the range of 50%  to 55%. Wall motion was normal; there were no regional wall  motion abnormalities. Doppler parameters are consistent with  abnormal left ventricular relaxation (grade 1 diastolic  dysfunction).  - Left atrium: The atrium was mildly dilated.  Myocardial perfusion study 04/26/2005 Probable normal perfusion with soft tissue attenuation (diaphragm).  No chest pain, no ST deviation.  EF 53%  Ultrasound carotid bilateral 02/15/2012 Bilateral carotid arteries normal patency without evidence of stenosis.  Assessment & Plan   1.  Essential hypertension- 140/88 today Continue losartan 50 mg tablet daily Low-sodium heart healthy diet-educated about low-sodium choices Continue physical activity Weight loss Patient encouraged to monitor blood pressure at home and bring blood pressure log to PCP and cardiology appointments.  2.  Hyperlipidemia-LDL 43 09/18/2018 Continue rosuvastatin 10 mg tablet daily Continue physical activity Heart healthy low-sodium diet  3.  Obstructive sleep apnea- compliant with CPAP, reports restful sleep  4.  Polycythemia/thrombocytopenia-followed by [name].  Stable CBC Continue lower dose of statin medication  5.  Mild  obesity- 238 pounds today down from 239.25 pounds 06/08/2018 Continue weight loss Heart healthy low-sodium diet Continue physical activity  Disposition: Follow-up with Dr. Claiborne Billings in 1 year  Deberah Pelton, NP-C 12/05/2018, 10:20 AM

## 2018-12-08 ENCOUNTER — Inpatient Hospital Stay (HOSPITAL_BASED_OUTPATIENT_CLINIC_OR_DEPARTMENT_OTHER): Payer: Medicare Other | Admitting: Hematology & Oncology

## 2018-12-08 ENCOUNTER — Inpatient Hospital Stay: Payer: Medicare Other

## 2018-12-08 ENCOUNTER — Telehealth: Payer: Self-pay | Admitting: Hematology & Oncology

## 2018-12-08 ENCOUNTER — Other Ambulatory Visit: Payer: Self-pay

## 2018-12-08 ENCOUNTER — Inpatient Hospital Stay: Payer: Medicare Other | Attending: Hematology & Oncology

## 2018-12-08 ENCOUNTER — Encounter: Payer: Self-pay | Admitting: Hematology & Oncology

## 2018-12-08 VITALS — BP 130/71 | HR 77 | Temp 97.1°F | Resp 18 | Wt 232.0 lb

## 2018-12-08 DIAGNOSIS — K909 Intestinal malabsorption, unspecified: Secondary | ICD-10-CM

## 2018-12-08 DIAGNOSIS — D45 Polycythemia vera: Secondary | ICD-10-CM | POA: Diagnosis not present

## 2018-12-08 LAB — CBC WITH DIFFERENTIAL (CANCER CENTER ONLY)
Abs Immature Granulocytes: 0.02 10*3/uL (ref 0.00–0.07)
Basophils Absolute: 0 10*3/uL (ref 0.0–0.1)
Basophils Relative: 1 %
Eosinophils Absolute: 0.2 10*3/uL (ref 0.0–0.5)
Eosinophils Relative: 3 %
HCT: 47.8 % (ref 39.0–52.0)
Hemoglobin: 15.2 g/dL (ref 13.0–17.0)
Immature Granulocytes: 0 %
Lymphocytes Relative: 21 %
Lymphs Abs: 1.1 10*3/uL (ref 0.7–4.0)
MCH: 28 pg (ref 26.0–34.0)
MCHC: 31.8 g/dL (ref 30.0–36.0)
MCV: 88 fL (ref 80.0–100.0)
Monocytes Absolute: 0.4 10*3/uL (ref 0.1–1.0)
Monocytes Relative: 8 %
Neutro Abs: 3.4 10*3/uL (ref 1.7–7.7)
Neutrophils Relative %: 67 %
Platelet Count: 125 10*3/uL — ABNORMAL LOW (ref 150–400)
RBC: 5.43 MIL/uL (ref 4.22–5.81)
RDW: 14.4 % (ref 11.5–15.5)
WBC Count: 5.1 10*3/uL (ref 4.0–10.5)
nRBC: 0 % (ref 0.0–0.2)

## 2018-12-08 LAB — CMP (CANCER CENTER ONLY)
ALT: 17 U/L (ref 0–44)
AST: 21 U/L (ref 15–41)
Albumin: 4.2 g/dL (ref 3.5–5.0)
Alkaline Phosphatase: 59 U/L (ref 38–126)
Anion gap: 7 (ref 5–15)
BUN: 25 mg/dL — ABNORMAL HIGH (ref 8–23)
CO2: 25 mmol/L (ref 22–32)
Calcium: 9.2 mg/dL (ref 8.9–10.3)
Chloride: 109 mmol/L (ref 98–111)
Creatinine: 1 mg/dL (ref 0.61–1.24)
GFR, Est AFR Am: >60 mL/min (ref >60)
GFR, Estimated: >60 mL/min (ref >60)
Glucose, Bld: 120 mg/dL — ABNORMAL HIGH (ref 70–99)
Potassium: 3.8 mmol/L (ref 3.5–5.1)
Sodium: 141 mmol/L (ref 135–145)
Total Bilirubin: 0.7 mg/dL (ref 0.3–1.2)
Total Protein: 6.4 g/dL — ABNORMAL LOW (ref 6.5–8.1)

## 2018-12-08 LAB — PLATELET BY CITRATE

## 2018-12-08 LAB — FERRITIN: Ferritin: 12 ng/mL — ABNORMAL LOW (ref 24–336)

## 2018-12-08 NOTE — Progress Notes (Signed)
Hematology and Oncology Follow Up Visit  Connor [name] [id number] 01-28-1946 72 y.o. 12/08/2018   Principle Diagnosis:   Polycythemia vera-Triple negative  Chronic immune thrombocytopenia vs medication  Transient iron deficiency secondary to phlebotomies  Current Therapy:    Phlebotomy to maintain hematocrit below 45%  Aspirin 81 mg by mouth daily  IV iron as indicated - last dose given on 06/02/2016     Interim History:  [name] is back for followup.  He is doing pretty well.  He is managing the coronavirus without too many difficulties.  He has been donating to the [employer].  He does a great job with his donations.  I am glad that he is able to donate.  His blood is A+.  I suspect this is very very helpful for the community.  He is under little stress right now.  There is issues at home with his refrigerator.  The refrigerator is only a year and a half old and is having problems.  He has been outside doing yard work.  He has a tan.  He does have little bit of a sunburn on the face.  He has had no palpitations.  There is been no change in bowel or bladder habits.  We last saw him back in May, his ferritin was 7 with an iron saturation of 49%.  Overall, his performance status is ECOG 0.    Medications:  Current Outpatient Medications:  .  alfuzosin (UROXATRAL) 10 MG 24 hr tablet, Take 10 mg by mouth every morning. , Disp: , Rfl:  .  aspirin 81 MG tablet, Take 81 mg by mouth 2 (two) times daily. , Disp: , Rfl:  .  cetirizine (ZYRTEC) 10 MG tablet, Take 10 mg by mouth daily., Disp: , Rfl:  .  Cholecalciferol (VITAMIN D-3) 5000 UNITS TABS, Take 1 tablet by mouth daily. , Disp: , Rfl:  .  Coenzyme Q10 (CO Q 10 PO), Take 1 capsule by mouth daily. , Disp: , Rfl:  .  cyclobenzaprine (FLEXERIL) 10 MG tablet, Take 1 tablet (10 mg total) by mouth 3 (three) times daily as needed for muscle spasms., Disp: 30 tablet, Rfl: 3 .  dicyclomine (BENTYL) 20 MG tablet, Take 1 tablet  (20 mg total) by mouth every 6 (six) hours., Disp: 30 tablet, Rfl: 2 .  docusate sodium (COLACE) 100 MG capsule, Take 200 mg by mouth daily as needed for mild constipation., Disp: , Rfl:  .  ferrous sulfate 325 (65 FE) MG tablet, Take 1 tablet (325 mg total) by mouth daily with breakfast., Disp: 90 tablet, Rfl: 3 .  Glucosamine-Chondroit-Vit C-Mn (GLUCOSAMINE CHONDR 1500 COMPLX) CAPS, Take by mouth every morning., Disp: , Rfl:  .  Icosapent Ethyl (VASCEPA) 1 g CAPS, Take 2 capsules (2 g total) by mouth 2 (two) times daily., Disp: 360 capsule, Rfl: 3 .  Ipratropium-Albuterol (COMBIVENT) 20-100 MCG/ACT AERS respimat, Inhale 1 puff into the lungs as needed for wheezing or shortness of breath., Disp: 3 Inhaler, Rfl: 3 .  losartan (COZAAR) 50 MG tablet, Take 1 tablet (50 mg total) by mouth daily., Disp: 90 tablet, Rfl: 3 .  montelukast (SINGULAIR) 10 MG tablet, Take 1 tablet (10 mg total) by mouth at bedtime., Disp: 90 tablet, Rfl: 3 .  Multiple Vitamins-Minerals (CENTRUM SILVER ULTRA MENS) TABS, Take 1 tablet by mouth every morning. Once a day, Disp: , Rfl:  .  ondansetron (ZOFRAN-ODT) 8 MG disintegrating tablet, TAKE ONE TABLET BY MOUTH EVERY 8 HOURS  AS NEEDED FOR NAUSEA OR VOMITING, Disp: 20 tablet, Rfl: 0 .  polyethylene glycol (MIRALAX / GLYCOLAX) packet, Take 17 g by mouth as needed. , Disp: , Rfl:  .  Probiotic Product (PHILLIPS COLON HEALTH PO), Take 1 capsule by mouth daily. , Disp: , Rfl:  .  pyridOXINE (VITAMIN B-6) 100 MG tablet, Take 100 mg by mouth daily. , Disp: , Rfl:  .  rosuvastatin (CRESTOR) 10 MG tablet, TAKE 1 TABLET (10 MG TOTAL) BY MOUTH DAILY., Disp: 90 tablet, Rfl: 3 .  sildenafil (REVATIO) 20 MG tablet, Take 2-5 tabs prior to sexual activity, Disp: 50 tablet, Rfl: 1 .  Turmeric 500 MG CAPS, Take by mouth every morning., Disp: , Rfl:  .  vitamin B-12 (CYANOCOBALAMIN) 500 MCG tablet, Take 500 mcg by mouth daily., Disp: , Rfl:   Allergies:  Allergies  Allergen Reactions  .  Dexlansoprazole Nausea And Vomiting  . Celecoxib Other (See Comments)    Solid white - generic pill only = caused diarrhea (other generics ok)  Solid white - generic pill only = caused diarrhea (other generics ok)     Past Medical History, Surgical history, Social history, and Family History were reviewed and updated.  Review of Systems: Review of Systems  Constitutional: Negative.   HENT: Negative.   Eyes: Negative.   Respiratory: Negative.   Cardiovascular: Negative.   Gastrointestinal: Negative.   Genitourinary: Negative.   Musculoskeletal: Negative.   Skin: Negative.   Neurological: Negative.   Endo/Heme/Allergies: Negative.   Psychiatric/Behavioral: Negative.  Negative for depression.     Physical Exam:  weight is 232 lb (105.2 kg). His temporal temperature is 97.1 F (36.2 C) (abnormal). His blood pressure is 130/71 and his pulse is 77. His respiration is 18 and oxygen saturation is 96%.  Saturday and Sunday in [place] and on he was in [place] over the next couple weeks I Connor [name].  [place]  Physical Exam Vitals signs reviewed.  HENT:     Head: Normocephalic and atraumatic.  Eyes:     Pupils: Pupils are equal, round, and reactive to light.  Neck:     Musculoskeletal: Normal range of motion.  Cardiovascular:     Rate and Rhythm: Normal rate and regular rhythm.     Heart sounds: Normal heart sounds.  Pulmonary:     Effort: Pulmonary effort is normal.     Breath sounds: Normal breath sounds.  Abdominal:     General: Bowel sounds are normal.     Palpations: Abdomen is soft.  Musculoskeletal: Normal range of motion.        General: No tenderness or deformity.  Lymphadenopathy:     Cervical: No cervical adenopathy.  Skin:    General: Skin is warm and dry.     Findings: No erythema or rash.  Neurological:     Mental Status: He is alert and oriented to person, place, and time.  Psychiatric:        Behavior: Behavior normal.        Thought  Content: Thought content normal.        Judgment: Judgment normal.     Lab Results  Component Value Date   WBC 5.1 12/08/2018   HGB 15.2 12/08/2018   HCT 47.8 12/08/2018   MCV 88.0 12/08/2018   PLT 125 (L) 12/08/2018     Chemistry      Component Value Date/Time   NA 141 12/08/2018 1009   NA 143 09/18/2018 1158  NA 139 04/13/2017 1150   NA 141 07/09/2016 1038   K 3.8 12/08/2018 1009   K 4.0 04/13/2017 1150   K 4.0 07/09/2016 1038   CL 109 12/08/2018 1009   CL 104 04/13/2017 1150   CO2 25 12/08/2018 1009   CO2 26 04/13/2017 1150   CO2 22 07/09/2016 1038   BUN 25 (H) 12/08/2018 1009   BUN 16 09/18/2018 1158   BUN 17 04/13/2017 1150   BUN 19.0 07/09/2016 1038   CREATININE 1.00 12/08/2018 1009   CREATININE 1.0 04/13/2017 1150   CREATININE 0.9 07/09/2016 1038      Component Value Date/Time   CALCIUM 9.2 12/08/2018 1009   CALCIUM 9.4 04/13/2017 1150   CALCIUM 9.5 07/09/2016 1038   ALKPHOS 59 12/08/2018 1009   ALKPHOS 64 04/13/2017 1150   ALKPHOS 85 07/09/2016 1038   AST 21 12/08/2018 1009   AST 22 07/09/2016 1038   ALT 17 12/08/2018 1009   ALT 26 04/13/2017 1150   ALT 22 07/09/2016 1038   BILITOT 0.7 12/08/2018 1009   BILITOT 0.62 07/09/2016 1038         Impression and Plan: Connor [name] is a 73 year old gentleman with polycythemia.  Even though his hemoglobin and hematocrit are up a little bit, I will hold off on phlebotomizing him.  I would much rather have him go to the [employer] for donation.  We will plan to get him back in another 3 months.  Volanda Napoleon, MD 8/28/202011:42 AM

## 2018-12-08 NOTE — Telephone Encounter (Signed)
Appointments scheduled and calendar was printed for patient per 8/28 los

## 2019-01-15 ENCOUNTER — Ambulatory Visit: Payer: Medicare Other | Admitting: Family Medicine

## 2019-01-30 ENCOUNTER — Encounter (HOSPITAL_COMMUNITY): Payer: Self-pay

## 2019-01-30 ENCOUNTER — Emergency Department (HOSPITAL_COMMUNITY): Payer: Medicare Other

## 2019-01-30 ENCOUNTER — Other Ambulatory Visit: Payer: Self-pay

## 2019-01-30 ENCOUNTER — Emergency Department (HOSPITAL_COMMUNITY)
Admission: EM | Admit: 2019-01-30 | Discharge: 2019-01-31 | Disposition: A | Discharge status: Home / Self Care | Payer: Medicare Other | Attending: Emergency Medicine | Admitting: Emergency Medicine

## 2019-01-30 DIAGNOSIS — Z7982 Long term (current) use of aspirin: Secondary | ICD-10-CM | POA: Insufficient documentation

## 2019-01-30 DIAGNOSIS — R404 Transient alteration of awareness: Secondary | ICD-10-CM | POA: Diagnosis not present

## 2019-01-30 DIAGNOSIS — Z87891 Personal history of nicotine dependence: Secondary | ICD-10-CM | POA: Insufficient documentation

## 2019-01-30 DIAGNOSIS — J449 Chronic obstructive pulmonary disease, unspecified: Secondary | ICD-10-CM | POA: Insufficient documentation

## 2019-01-30 DIAGNOSIS — Z79899 Other long term (current) drug therapy: Secondary | ICD-10-CM | POA: Insufficient documentation

## 2019-01-30 DIAGNOSIS — R4182 Altered mental status, unspecified: Secondary | ICD-10-CM | POA: Diagnosis not present

## 2019-01-30 DIAGNOSIS — I10 Essential (primary) hypertension: Secondary | ICD-10-CM | POA: Insufficient documentation

## 2019-01-30 DIAGNOSIS — Z85828 Personal history of other malignant neoplasm of skin: Secondary | ICD-10-CM | POA: Diagnosis not present

## 2019-01-30 LAB — COMPREHENSIVE METABOLIC PANEL
ALT: 19 U/L (ref 0–44)
AST: 31 U/L (ref 15–41)
Albumin: 4.1 g/dL (ref 3.5–5.0)
Alkaline Phosphatase: 51 U/L (ref 38–126)
Anion gap: 9 (ref 5–15)
BUN: 20 mg/dL (ref 8–23)
CO2: 24 mmol/L (ref 22–32)
Calcium: 9.4 mg/dL (ref 8.9–10.3)
Chloride: 110 mmol/L (ref 98–111)
Creatinine, Ser: 1.04 mg/dL (ref 0.61–1.24)
GFR calc Af Amer: >60 mL/min (ref >60)
GFR calc non Af Amer: >60 mL/min (ref >60)
Glucose, Bld: 138 mg/dL — ABNORMAL HIGH (ref 70–99)
Potassium: 3.9 mmol/L (ref 3.5–5.1)
Sodium: 143 mmol/L (ref 135–145)
Total Bilirubin: 0.6 mg/dL (ref 0.3–1.2)
Total Protein: 6.3 g/dL — ABNORMAL LOW (ref 6.5–8.1)

## 2019-01-30 LAB — CBC
HCT: 47.2 % (ref 39.0–52.0)
Hemoglobin: 15.6 g/dL (ref 13.0–17.0)
MCH: 29.8 pg (ref 26.0–34.0)
MCHC: 33.1 g/dL (ref 30.0–36.0)
MCV: 90.1 fL (ref 80.0–100.0)
Platelets: 129 10*3/uL — ABNORMAL LOW (ref 150–400)
RBC: 5.24 MIL/uL (ref 4.22–5.81)
RDW: 13.5 % (ref 11.5–15.5)
WBC: 5.1 10*3/uL (ref 4.0–10.5)
nRBC: 0 % (ref 0.0–0.2)

## 2019-01-30 LAB — DIFFERENTIAL
Abs Immature Granulocytes: 0.03 10*3/uL (ref 0.00–0.07)
Basophils Absolute: 0 10*3/uL (ref 0.0–0.1)
Basophils Relative: 1 %
Eosinophils Absolute: 0.2 10*3/uL (ref 0.0–0.5)
Eosinophils Relative: 3 %
Immature Granulocytes: 1 %
Lymphocytes Relative: 21 %
Lymphs Abs: 1.1 10*3/uL (ref 0.7–4.0)
Monocytes Absolute: 0.4 10*3/uL (ref 0.1–1.0)
Monocytes Relative: 8 %
Neutro Abs: 3.4 10*3/uL (ref 1.7–7.7)
Neutrophils Relative %: 66 %

## 2019-01-30 LAB — PROTIME-INR
INR: 1.1 (ref 0.8–1.2)
Prothrombin Time: 13.8 seconds (ref 11.4–15.2)

## 2019-01-30 LAB — CBG MONITORING, ED: Glucose-Capillary: 143 mg/dL — ABNORMAL HIGH (ref 70–99)

## 2019-01-30 LAB — APTT: aPTT: 27 seconds (ref 24–36)

## 2019-01-30 MED ORDER — SODIUM CHLORIDE 0.9% FLUSH: 3.0000 mL | Freq: Once | INTRAVENOUS | Status: DC

## 2019-01-30 NOTE — ED Triage Notes (Signed)
Pt accompanied by wife who reports acute AMS today, pt doesn't remember anything that happened before 630 this afternoon. No neuro deficits noted. No weakness, facial droop, or slurred speech. PERRLA.

## 2019-01-30 NOTE — ED Notes (Signed)
Patient transported to CT 

## 2019-01-30 NOTE — ED Provider Notes (Signed)
[facility] EMERGENCY DEPARTMENT Provider Note   CSN: [id number] Arrival date & time: 01/30/19  1931     History   Chief Complaint Chief Complaint  Patient presents with   Altered Mental Status    HPI Connor [name] is a 73 y.o. male.     The history is provided by the patient.  Altered Mental Status Presenting symptoms: memory loss   Presenting symptoms: no confusion   Severity:  Mild Most recent episode:  Today Episode history:  Single Duration:  30 minutes Progression:  Resolved Chronicity:  New Context: not dementia   Associated symptoms: no abdominal pain, no agitation, no fever, no hallucinations, no palpitations, no rash, no seizures and no vomiting     Past Medical History:  Diagnosis Date   Anemia    BPH (benign prostatic hyperplasia)    Cancer (HCC)    squamous cell on nose , inside nose    Cardiomegaly    Carotid artery stenosis    COPD, mild ([place]) 05/26/2007   PFT 09/09/11>>FEV1 3.06 (95%), FEV1% 69, FEF 25-75% 1.70 (59%), TLC 7.67 (109%), DLCO 108%, no BD     Cough 05/26/2007   Erectile dysfunction    GERD (gastroesophageal reflux disease)    H/O vitamin D deficiency    History of nuclear stress test    this revealed normal myocardial perfusion and function. Post stress EF was 57%. There was no region of scar or ischemia.   Hx of echocardiogram 02/15/2012   This showed mild concentric LVH. EF > 55%. H edid have grade 1 diastolic dysfunction with mitral valve E:A ratio of 0.81, his left atrium was mildly dilated by volume assessment at 33.3 mL/m2. He did have mild tricupid regurgitation with upper normal RV systolic pressure at [id number]. He had aortic valve sclerosis without stenosis.   Hyperlipemia    Hypertension    Iron deficiency anemia, unspecified 09/01/2012   Iron malabsorption 05/28/2016   Nephrolithiasis    stones    Peyronie's disease    Polycythemia vera(238.4) 04/19/2013   Shortness of breath    with  exertion    Sleep apnea    Cpap-    Thrombocytopenia (HCC)     Patient Active Problem List   Diagnosis Date Noted   Squamous cell cancer of skin of nose 07/01/2017   Erectile dysfunction due to arterial insufficiency 07/01/2017   Carcinoma of nasal cavity ([place]) 12/22/2016   Iron malabsorption 05/28/2016   Hyperlipidemia LDL goal <100 04/12/2014   Polycythemia vera ([place]) 04/19/2013   Family history of early CAD 02/19/2013   Iron deficiency anemia 09/01/2012   H/O vitamin D deficiency 05/21/2012   Diverticulosis of colon 05/21/2012   Carotid artery stenosis, asymptomatic 05/21/2012   BPH (benign prostatic hyperplasia)    Cardiomegaly    Thrombocytopenia ([place])    Lap Nissen Fundoplication August 0000000 11/26/2011   Weight gain, abnormal 08/27/2009   ESSENTIAL HYPERTENSION, BENIGN 08/27/2009   OSA (obstructive sleep apnea) 02/19/2008   COPD, mild ([place]) 05/26/2007    Past Surgical History:  Procedure Laterality Date   APPENDECTOMY     EYE SURGERY Bilateral    eye lid surgery   HIATAL HERNIA REPAIR  11/24/2011   Procedure: LAPAROSCOPIC REPAIR OF HIATAL HERNIA;  Surgeon: Pedro Earls, MD;  Location: [facility];  Service: General;;   KNEE SURGERY     right   LAPAROSCOPIC NISSEN FUNDOPLICATION  [id number]   Procedure: LAPAROSCOPIC NISSEN FUNDOPLICATION;  Surgeon: Pedro Earls, MD;  Location: [facility];  Service: General;  Laterality: N/A;   NOSE SURGERY  2013   SHOULDER SURGERY     left        Home Medications    Prior to Admission medications   Medication Sig Start Date End Date Taking? Authorizing Provider  alfuzosin (UROXATRAL) 10 MG 24 hr tablet Take 10 mg by mouth every morning.    Yes [provider]  aspirin 81 MG tablet Take 81 mg by mouth 2 (two) times daily.    Yes [provider]  cetirizine (ZYRTEC) 10 MG tablet Take 10 mg by mouth daily.   Yes [provider]  Cholecalciferol (VITAMIN D-3) 5000 UNITS TABS  Take 1 tablet by mouth daily.    Yes [provider]  Coenzyme Q10 (CO Q 10 PO) Take 1 capsule by mouth daily.    Yes [provider]  cyclobenzaprine (FLEXERIL) 10 MG tablet Take 1 tablet (10 mg total) by mouth 3 (three) times daily as needed for muscle spasms. 11/08/17  Yes [name], MD  ferrous sulfate 325 (65 FE) MG tablet Take 1 tablet (325 mg total) by mouth daily with breakfast. 09/20/18  Yes [name], MD  Glucosamine-Chondroit-Vit C-Mn (GLUCOSAMINE CHONDR 1500 COMPLX) CAPS Take 1 capsule by mouth daily.    Yes [provider]  Icosapent Ethyl (VASCEPA) 1 g CAPS Take 2 capsules (2 g total) by mouth 2 (two) times daily. 09/20/18  Yes [name], MD  Ipratropium-Albuterol (COMBIVENT) 20-100 MCG/ACT AERS respimat Inhale 1 puff into the lungs as needed for wheezing or shortness of breath. 09/20/18  Yes [name], MD  losartan (COZAAR) 50 MG tablet Take 1 tablet (50 mg total) by mouth daily. Patient taking differently: Take 25 mg by mouth daily.  09/20/18  Yes [name], MD  montelukast (SINGULAIR) 10 MG tablet Take 1 tablet (10 mg total) by mouth at bedtime. 09/20/18  Yes [name], MD  Multiple Vitamins-Minerals (CENTRUM SILVER ULTRA MENS) TABS Take 1 tablet by mouth every morning. Once a day   Yes [provider]  Probiotic Product ([place]) Take 1 capsule by mouth daily.    Yes [provider]  pyridOXINE (VITAMIN B-6) 100 MG tablet Take 100 mg by mouth daily.    Yes [provider]  rosuvastatin (CRESTOR) 10 MG tablet TAKE 1 TABLET (10 MG TOTAL) BY MOUTH DAILY. Patient taking differently: Take 10 mg by mouth daily.  09/20/18  Yes [name], MD  sildenafil (REVATIO) 20 MG tablet Take 2-5 tabs prior to sexual activity Patient taking differently: Take 20 mg by mouth 3 (three) times daily as needed (For sexual activity).  12/31/15  Yes [name], MD  Turmeric 500 MG CAPS Take 1 tablet  by mouth daily.    Yes [provider]  vitamin B-12 (CYANOCOBALAMIN) 500 MCG tablet Take 500 mcg by mouth daily.   Yes [provider]    Family History Family History  Problem Relation Age of Onset   Heart disease Mother    Diabetes Mother    Heart disease Father    Heart disease Sister    Arthritis Sister    Diabetes Brother    Hypertension Brother    Heart attack Brother        enlarged heart   Liver cancer Paternal Grandfather    Throat cancer Maternal Grandfather    Hemachromatosis Daughter    Arthritis Daughter  GI problems Daughter    Thyroid disease Daughter    Hypertension Daughter    Heart disease Paternal Grandmother    Thyroid disease Daughter        pituitary tumor   Migraines Daughter    Colon cancer Neg Hx    Esophageal cancer Neg Hx    Rectal cancer Neg Hx    Stomach cancer Neg Hx     Social History Social History   Tobacco Use   Smoking status: Former Smoker    Packs/day: 0.25    Years: 11.00    Pack years: 2.75    Types: Cigarettes    Start date: 05/17/1958    Quit date: 04/12/1966    Years since quitting: 52.8   Smokeless tobacco: Never Used   Tobacco comment: quit smoking in 1968  Substance Use Topics   Alcohol use: Yes    Alcohol/week: 0.0 standard drinks    Comment: seldom   Drug use: No     Allergies   Dexlansoprazole and Celecoxib   Review of Systems Review of Systems  Constitutional: Negative for chills and fever.  HENT: Negative for ear pain and sore throat.   Eyes: Negative for pain and visual disturbance.  Respiratory: Negative for cough and shortness of breath.   Cardiovascular: Negative for chest pain and palpitations.  Gastrointestinal: Negative for abdominal pain and vomiting.  Genitourinary: Negative for dysuria and hematuria.  Musculoskeletal: Negative for arthralgias and back pain.  Skin: Negative for color change and rash.  Neurological: Negative for seizures and  syncope.  Psychiatric/Behavioral: Positive for memory loss. Negative for agitation, behavioral problems, confusion, decreased concentration, dysphoric mood, hallucinations, self-injury, sleep disturbance and suicidal ideas. The patient is not nervous/anxious and is not hyperactive.   All other systems reviewed and are negative.    Physical Exam Updated Vital Signs  ED Triage Vitals  Enc Vitals Group     BP 01/30/19 1944 (!) 163/87     Pulse Rate 01/30/19 1944 80     Resp 01/30/19 1944 19     Temp 01/30/19 1944 98.8 F (37.1 C)     Temp Source 01/30/19 1944 Oral     SpO2 01/30/19 1944 95 %     Weight 01/30/19 1943 230 lb (104.3 kg)     Height 01/30/19 1943 5\' 11"  (1.803 m)     Head Circumference --      Peak Flow --      Pain Score 01/30/19 1943 0     Pain Loc --      Pain Edu? --      Excl. in [place]? --     Physical Exam Vitals signs and nursing note reviewed.  Constitutional:      General: He is not in acute distress.    Appearance: He is well-developed. He is not ill-appearing.  HENT:     Head: Normocephalic and atraumatic.     Nose: Nose normal.     Mouth/Throat:     Mouth: Mucous membranes are moist.  Eyes:     Extraocular Movements: Extraocular movements intact.     Conjunctiva/sclera: Conjunctivae normal.     Pupils: Pupils are equal, round, and reactive to light.  Neck:     Musculoskeletal: Normal range of motion and neck supple.  Cardiovascular:     Rate and Rhythm: Normal rate and regular rhythm.     Pulses: Normal pulses.     Heart sounds: Normal heart sounds. No murmur.  Pulmonary:  Effort: Pulmonary effort is normal. No respiratory distress.     Breath sounds: Normal breath sounds.  Abdominal:     General: Abdomen is flat. There is no distension.     Palpations: Abdomen is soft.     Tenderness: There is no abdominal tenderness.  Musculoskeletal: Normal range of motion.  Skin:    General: Skin is warm and dry.     Capillary Refill: Capillary refill  takes less than 2 seconds.  Neurological:     General: No focal deficit present.     Mental Status: He is alert and oriented to person, place, and time.     Cranial Nerves: No cranial nerve deficit.     Sensory: No sensory deficit.     Motor: No weakness.     Coordination: Coordination normal.     Comments: 5+ out of 5 strength throughout, normal sensation, no drift, normal finger-to-nose finger, normal speech, alert and oriented x3  Psychiatric:        Mood and Affect: Mood normal.      ED Treatments / Results  Labs (all labs ordered are listed, but only abnormal results are displayed) Labs Reviewed  CBC - Abnormal; Notable for the following components:      Result Value   Platelets 129 (*)    All other components within normal limits  COMPREHENSIVE METABOLIC PANEL - Abnormal; Notable for the following components:   Glucose, Bld 138 (*)    Total Protein 6.3 (*)    All other components within normal limits  CBG MONITORING, ED - Abnormal; Notable for the following components:   Glucose-Capillary 143 (*)    All other components within normal limits  PROTIME-INR  APTT  DIFFERENTIAL  CBG MONITORING, ED    EKG EKG Interpretation  Date/Time:  Tuesday January 30 2019 19:36:15 EDT Ventricular Rate:  85 PR Interval:  170 QRS Duration: 106 QT Interval:  372 QTC Calculation: 442 R Axis:   64 Text Interpretation:  Normal sinus rhythm Normal ECG Confirmed by Lennice Sites [phone]) on 01/30/2019 7:51:13 PM   Radiology Ct Head Wo Contrast  Result Date: 01/30/2019 CLINICAL DATA:  Altered mental status EXAM: CT HEAD WITHOUT CONTRAST TECHNIQUE: Contiguous axial images were obtained from the base of the skull through the vertex without intravenous contrast. COMPARISON:  None. FINDINGS: Brain: No evidence of acute infarction, hemorrhage, hydrocephalus, extra-axial collection or mass lesion/mass effect. Minimal right subcortical white matter hypodensity, most likely small vessel  ischemic change. Vascular: No hyperdense vessel or unexpected calcification. Skull: Normal. Negative for fracture or focal lesion. Sinuses/Orbits: Mucosal thickening in the ethmoid sinuses Other: None IMPRESSION: No CT evidence for acute intracranial abnormality. Electronically Signed   By: Donavan Foil [name]   On: 01/30/2019 20:54   Mr Brain Wo Contrast (neuro Protocol)  Result Date: 01/31/2019 CLINICAL DATA:  Initial evaluation for acute altered mental status, unexplained. EXAM: MRI HEAD WITHOUT CONTRAST TECHNIQUE: Multiplanar, multiecho pulse sequences of the brain and surrounding structures were obtained without intravenous contrast. COMPARISON:  Prior head CT from earlier the same day. FINDINGS: Brain: Cerebral volume within normal limits for patient age. Scattered patchy subcentimeter T2/FLAIR hyperintensities noted involving the periventricular, deep, and subcortical white matter both cerebral hemispheres, nonspecific, but most like related chronic microvascular ischemic disease. No abnormal foci of restricted diffusion to suggest acute or subacute ischemia. Gray-white matter differentiation well maintained. No encephalomalacia to suggest chronic infarction. No foci of susceptibility artifact to suggest acute or chronic intracranial hemorrhage. No mass lesion, midline  shift or mass effect. No hydrocephalus. No extra-axial fluid collection. Major dural sinuses are grossly patent. Pituitary gland and suprasellar region are normal. Midline structures intact and normal. Vascular: Major intracranial vascular flow voids well maintained and normal in appearance. Skull and upper cervical spine: Craniocervical junction normal. Visualized upper cervical spine within normal limits. Bone marrow signal intensity normal. No scalp soft tissue abnormality. Sinuses/Orbits: Globes and orbital soft tissues within normal limits. Scattered mucosal thickening noted within the ethmoidal air cells. Paranasal sinuses are  otherwise clear. No mastoid effusion. Inner ear structures grossly within normal limits. Other: None. IMPRESSION: 1. No acute intracranial abnormality identified. 2. Scattered nonspecific cerebral white matter changes, but most likely related to chronic microvascular ischemic disease. Overall, appearance is mild to moderate in nature. Electronically Signed   By: Jeannine Boga [name]   On: 01/31/2019 00:42    Procedures Procedures (including critical care time)  Medications Ordered in ED Medications  sodium chloride flush (NS) 0.9 % injection 3 mL (3 mLs Intravenous Not Given 01/30/19 2050)     Initial Impression / Assessment and Plan / ED Course  I have reviewed the triage vital signs and the nursing notes.  Pertinent labs & imaging results that were available during my care of the patient were reviewed by me and considered in my medical decision making (see chart for details).     Connor [name] is a 73 year old male with history of high cholesterol, COPD who presents to the ED after period of confusion earlier today.  Normal vitals.  No fever.  Normal neurological exam.  Patient had about 30 minutes of amnesia earlier today.  Was unable to remember what he did today or where he was.  Did not have any other weakness, numbness, speech difficulty.  It appears that he likely had some type of global transient amnesia.  He is neurologically intact.  Lab work showed no significant electrolyte abnormality, kidney injury, leukocytosis.  EKG showed sinus rhythm.  CT scan of the head was unremarkable.  Discussed with neurology who recommends an MRI to rule out stroke.  If MRI is unremarkable can follow-up with neurology outpatient for EEG.  MRI shows no stroke.  We will follow-up with neurology for an EEG.  This could be a seizure type event.  Recommend no driving or heavy machinery use until cleared by neurology.  Did not have any symptoms while under my care.  No chest pain, no shortness of  breath.  Doubt cardiac or pulmonary process.  Given education and reassurance and discharged from the ED in good condition.  Given return precautions.  This chart was dictated using voice recognition software.  Despite best efforts to proofread,  errors can occur which can change the documentation meaning.    Final Clinical Impressions(s) / ED Diagnoses   Final diagnoses:  Transient alteration of awareness    ED Discharge Orders    None       Lennice Sites, DO 01/31/19 0112

## 2019-01-30 NOTE — ED Notes (Signed)
Patient transported to MRI 

## 2019-01-31 NOTE — Discharge Instructions (Signed)
Do not drive or operate heavy machinery until you see neurology for your EEG.  Please return to the ED if her symptoms worsen.

## 2019-01-31 NOTE — ED Notes (Signed)
Pt verbalized discharge instructions.

## 2019-02-01 ENCOUNTER — Ambulatory Visit (INDEPENDENT_AMBULATORY_CARE_PROVIDER_SITE_OTHER): Payer: Medicare Other | Admitting: Neurology

## 2019-02-01 ENCOUNTER — Encounter: Payer: Self-pay | Admitting: Neurology

## 2019-02-01 ENCOUNTER — Other Ambulatory Visit: Payer: Self-pay

## 2019-02-01 VITALS — BP 142/70 | HR 77 | Temp 97.6°F | Ht 71.5 in | Wt 233.5 lb

## 2019-02-01 DIAGNOSIS — R404 Transient alteration of awareness: Secondary | ICD-10-CM | POA: Diagnosis not present

## 2019-02-01 DIAGNOSIS — R200 Anesthesia of skin: Secondary | ICD-10-CM

## 2019-02-01 DIAGNOSIS — R0989 Other specified symptoms and signs involving the circulatory and respiratory systems: Secondary | ICD-10-CM

## 2019-02-01 NOTE — Progress Notes (Signed)
[facility]  PATIENT: Connor [name] DOB: 07-21-1945  REFERRING DOCTOR OR PCP: Caryl Pina, MD SOURCE: Patient, notes from primary care, ED notes, imaging and lab reports, MRI and CT images personally reviewed.  _________________________________   HISTORICAL  CHIEF COMPLAINT:  Chief Complaint  Patient presents with  . New Patient (Initial Visit)    RM 73 with wife (temp: 97.7). Internal referral for altered mental status. Had a 45 min period on 01/30/2019 where he did not remember anything he had done that day. Went to hospital. Did not have EEG there but was it was recommended to him to do EEG 24-48 hours after episode. He has EEG set up for 02/14/2019 but wanting to complete sooner if possible. He has had no further episodes since.      HISTORY OF PRESENT ILLNESS:  I had the pleasure of seeing your patient, Connor [name], at [facility] for neurologic consultation regarding his transient alteration of awareness.  He is a 73 year old man who experienced a recent episode of transient alteration of awareness.  Two days ago, he woke up and played golf in the morning.   He had lunch and returned home.   He started blowing leaves and remembered waving goodbye to his wife.   He went inside and took a shower.   His wife returned right after he got out.   He had no memory of taking a shower or anything else that happened earlier that day.    He seemed confused and asked his wife what day it was.  He repeated the question a few minutes later.   He had no weakness and voice was not slurred.  He went to the hospital.  On his way there, about 45 minutes after his wife first noted his issues, he began to remember and quickly had full recall of events from the morning.   He has since recalled some events between his shower and the car ride to the hospital.     He had a CT scan followed by MRI.  I personally reviewed the images.  There were no acute findings  and the mRI just showed mild chronic microvascular changes.    CT shows some calcification in the left vertebral artery  No history of similar event.   No history of seizures.  He has polycythemia vera and gets phlebotomy treatments (HgB was normal at 15. 6 earlier this week).  He has HTN but not DM.  He was a social smoker.   He is physically active.      REVIEW OF SYSTEMS: Constitutional: No fevers, chills, sweats, or change in appetite Eyes: No visual changes, double vision, eye pain Ear, nose and throat: No hearing loss, ear pain, nasal congestion, sore throat Cardiovascular: No chest pain, palpitations Respiratory: No shortness of breath at rest or with exertion.   No wheezes GastrointestinaI: No nausea, vomiting, diarrhea, abdominal pain, fecal incontinence Genitourinary: No dysuria, urinary retention or frequency.  No nocturia. Musculoskeletal: No neck pain, back pain Integumentary: No rash, pruritus, skin lesions Neurological: as above Psychiatric: No depression at this time.  No anxiety Endocrine: No palpitations, diaphoresis, change in appetite, change in weigh or increased thirst Hematologic/Lymphatic: No anemia, purpura, petechiae. Allergic/Immunologic: No itchy/runny eyes, nasal congestion, recent allergic reactions, rashes  ALLERGIES: Allergies  Allergen Reactions  . Dexlansoprazole Nausea And Vomiting  . Celecoxib Other (See Comments)    Solid white - generic pill only = caused diarrhea (other generics ok)  Solid white - generic pill only = caused diarrhea (other generics ok)     HOME MEDICATIONS:  Current Outpatient Medications:  .  alfuzosin (UROXATRAL) 10 MG 24 hr tablet, Take 10 mg by mouth every morning. , Disp: , Rfl:  .  aspirin 81 MG tablet, Take 81 mg by mouth 2 (two) times daily. , Disp: , Rfl:  .  cetirizine (ZYRTEC) 10 MG tablet, Take 10 mg by mouth daily., Disp: , Rfl:  .  Cholecalciferol (VITAMIN D-3) 5000 UNITS TABS, Take 1 tablet by mouth daily. ,  Disp: , Rfl:  .  Coenzyme Q10 (CO Q 10 PO), Take 1 capsule by mouth daily. , Disp: , Rfl:  .  cyclobenzaprine (FLEXERIL) 10 MG tablet, Take 1 tablet (10 mg total) by mouth 3 (three) times daily as needed for muscle spasms., Disp: 30 tablet, Rfl: 3 .  ferrous sulfate 325 (65 FE) MG tablet, Take 1 tablet (325 mg total) by mouth daily with breakfast., Disp: 90 tablet, Rfl: 3 .  Glucosamine-Chondroit-Vit C-Mn (GLUCOSAMINE CHONDR 1500 COMPLX) CAPS, Take 1 capsule by mouth daily. , Disp: , Rfl:  .  Icosapent Ethyl (VASCEPA) 1 g CAPS, Take 2 capsules (2 g total) by mouth 2 (two) times daily., Disp: 360 capsule, Rfl: 3 .  Ipratropium-Albuterol (COMBIVENT) 20-100 MCG/ACT AERS respimat, Inhale 1 puff into the lungs as needed for wheezing or shortness of breath., Disp: 3 Inhaler, Rfl: 3 .  losartan (COZAAR) 50 MG tablet, Take 1 tablet (50 mg total) by mouth daily. (Patient taking differently: Take 25 mg by mouth daily. ), Disp: 90 tablet, Rfl: 3 .  montelukast (SINGULAIR) 10 MG tablet, Take 1 tablet (10 mg total) by mouth at bedtime., Disp: 90 tablet, Rfl: 3 .  Multiple Vitamins-Minerals (CENTRUM SILVER ULTRA MENS) TABS, Take 1 tablet by mouth every morning. Once a day, Disp: , Rfl:  .  Probiotic Product (PHILLIPS COLON HEALTH PO), Take 1 capsule by mouth daily. , Disp: , Rfl:  .  pyridOXINE (VITAMIN B-6) 100 MG tablet, Take 100 mg by mouth daily. , Disp: , Rfl:  .  rosuvastatin (CRESTOR) 10 MG tablet, TAKE 1 TABLET (10 MG TOTAL) BY MOUTH DAILY. (Patient taking differently: Take 10 mg by mouth daily. ), Disp: 90 tablet, Rfl: 3 .  sildenafil (REVATIO) 20 MG tablet, Take 2-5 tabs prior to sexual activity (Patient taking differently: Take 20 mg by mouth 3 (three) times daily as needed (For sexual activity). ), Disp: 50 tablet, Rfl: 1 .  Turmeric 500 MG CAPS, Take 1 tablet by mouth daily. , Disp: , Rfl:  .  vitamin B-12 (CYANOCOBALAMIN) 500 MCG tablet, Take 500 mcg by mouth daily., Disp: , Rfl:   PAST MEDICAL  HISTORY: Past Medical History:  Diagnosis Date  . Anemia   . BPH (benign prostatic hyperplasia)   . Cancer (HCC)    squamous cell on nose , inside nose   . Cardiomegaly   . Carotid artery stenosis   . COPD, mild ([place]) 05/26/2007   PFT 09/09/11>>FEV1 3.06 (95%), FEV1% 69, FEF 25-75% 1.70 (59%), TLC 7.67 (109%), DLCO 108%, no BD    . Cough 05/26/2007  . Erectile dysfunction   . GERD (gastroesophageal reflux disease)   . H/O vitamin D deficiency   . History of nuclear stress test    this revealed normal myocardial perfusion and function. Post stress EF was 57%. There was no region of scar or ischemia.  [name] Hx of echocardiogram 02/15/2012   This showed  mild concentric LVH. EF > 55%. H edid have grade 1 diastolic dysfunction with mitral valve E:A ratio of 0.81, his left atrium was mildly dilated by volume assessment at 33.3 mL/m2. He did have mild tricupid regurgitation with upper normal RV systolic pressure at 53ZJ. He had aortic valve sclerosis without stenosis.  . Hyperlipemia   . Hypertension   . Iron deficiency anemia, unspecified 09/01/2012  . Iron malabsorption 05/28/2016  . Nephrolithiasis    stones   . Peyronie's disease   . Polycythemia vera(238.4) 04/19/2013  . Shortness of breath    with exertion   . Sleep apnea    Cpap-   . Thrombocytopenia ([place])     PAST SURGICAL HISTORY: Past Surgical History:  Procedure Laterality Date  . APPENDECTOMY    . EYE SURGERY Bilateral    eye lid surgery  . HIATAL HERNIA REPAIR  11/24/2011   Procedure: LAPAROSCOPIC REPAIR OF HIATAL HERNIA;  Surgeon: Pedro Earls, MD;  Location: [facility];  Service: General;;  . KNEE SURGERY     right  . LAPAROSCOPIC NISSEN FUNDOPLICATION  6/73/4193   Procedure: LAPAROSCOPIC NISSEN FUNDOPLICATION;  Surgeon: Pedro Earls, MD;  Location: [facility];  Service: General;  Laterality: N/A;  . NOSE SURGERY  2013  . SHOULDER SURGERY     left    FAMILY HISTORY: Family History  Problem Relation Age of Onset  .  Heart disease Mother   . Diabetes Mother   . Heart disease Father   . Heart disease Sister   . Arthritis Sister   . Diabetes Brother   . Hypertension Brother   . Heart attack Brother        enlarged heart  . Liver cancer Paternal Grandfather   . Throat cancer Maternal Grandfather   . Hemachromatosis Daughter   . Arthritis Daughter   . GI problems Daughter   . Thyroid disease Daughter   . Hypertension Daughter   . Heart disease Paternal Grandmother   . Thyroid disease Daughter        pituitary tumor  . Migraines Daughter   . Colon cancer Neg Hx   . Esophageal cancer Neg Hx   . Rectal cancer Neg Hx   . Stomach cancer Neg Hx     SOCIAL HISTORY:  Social History   Socioeconomic History  . Marital status: Married    Spouse name: Hoyle Sauer   . Number of children: 3  . Years of education: Not on file  . Highest education level: Not on file  Occupational History  . 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 Financial resource strain: Not on file  . Food insecurity    Worry: Not on file    Inability: Not on file  . Transportation needs    Medical: Not on file    Non-medical: Not on file  Tobacco Use  . Smoking status: Former Smoker    Packs/day: 0.25    Years: 11.00    Pack years: 2.75    Types: Cigarettes    Start date: 05/17/1958    Quit date: 04/12/1966    Years since quitting: 52.8  . Smokeless tobacco: Never Used  . Tobacco comment: quit smoking in 1968  Substance and Sexual Activity  . Alcohol use: Yes    Alcohol/week: 0.0 standard drinks    Comment: seldom  . Drug use: No  . Sexual activity: Not on file  Lifestyle  .  Physical activity    Days per week: Not on file    Minutes per session: Not on file  . Stress: Not on file  Relationships  . Social Herbalist on phone: Not on file    Gets together: Not on file    Attends religious service: Not on file    Active member of club or organization: Not  on file    Attends meetings of clubs or organizations: Not on file    Relationship status: Not on file  . Intimate partner violence    Fear of current or ex partner: Not on file    Emotionally abused: Not on file    Physically abused: Not on file    Forced sexual activity: Not on file  Other Topics Concern  . Not on file  Social History Narrative   Right handed    Live with wife     PHYSICAL EXAM  Vitals:   02/01/19 1055  BP: (!) 142/70  Pulse: 77  Temp: 97.6 F (36.4 C)  SpO2: 98%  Weight: 233 lb 8 oz (105.9 kg)  Height: 5' 11.5" (1.816 m)    Body mass index is 32.11 kg/m.   General: The patient is well-developed and well-nourished and in no acute distress  HEENT:  Head is [place]/AT.  Sclera are anicteric.  Neck: No carotid bruits are noted.  The neck is nontender.  Cardiovascular: The heart has a regular rate and rhythm with a normal S1 and S2. There were no murmurs, gallops or rubs.    Skin: Extremities are without rash or  edema.  Musculoskeletal:  Back is nontender  Neurologic Exam  Mental status: The patient is alert and oriented x 3 at the time of the examination. The patient has apparent normal recent and remote memory, with an apparently normal attention span and concentration ability.   Speech is normal.  Cranial nerves: Extraocular movements are full. Pupils are [employer] fields are full.  Facial symmetry is present. There is good facial sensation to soft touch bilaterally.Facial strength is normal.  Trapezius and sternocleidomastoid strength is normal. No dysarthria is noted.  No obvious hearing deficits are noted.  Motor:  Muscle bulk is normal.   Tone is normal. Strength is  5 / 5 in all 4 extremities.   Sensory: Sensory testing is intact to pinprick, soft touch and vibration sensation in the arms.  He has intact sensation to pinprick in the toes but has very reduced sensation to vibration at the toes and reduced sensation at the ankles.   Coordination: Cerebellar testing reveals good finger-nose-finger and heel-to-shin bilaterally.  Gait and station: Station is normal.   Gait is normal. Tandem gait is wide.  Romberg is borderline.    Reflexes: Deep tendon reflexes are symmetric and normal bilaterally.   Plantar responses are flexor.    DIAGNOSTIC DATA (LABS, IMAGING, TESTING) - I reviewed patient records, labs, notes, testing and imaging myself where available.  Lab Results  Component Value Date   WBC 5.1 01/30/2019   HGB 15.6 01/30/2019   HCT 47.2 01/30/2019   MCV 90.1 01/30/2019   PLT 129 (L) 01/30/2019      Component Value Date/Time   NA 143 01/30/2019 1949   NA 143 09/18/2018 1158   NA 139 04/13/2017 1150   NA 141 07/09/2016 1038   K 3.9 01/30/2019 1949   K 4.0 04/13/2017 1150   K 4.0 07/09/2016 1038   CL 110 01/30/2019 1949  CL 104 04/13/2017 1150   CO2 24 01/30/2019 1949   CO2 26 04/13/2017 1150   CO2 22 07/09/2016 1038   GLUCOSE 138 (H) 01/30/2019 1949   GLUCOSE 135 (H) 04/13/2017 1150   BUN 20 01/30/2019 1949   BUN 16 09/18/2018 1158   BUN 17 04/13/2017 1150   BUN 19.0 07/09/2016 1038   CREATININE 1.04 01/30/2019 1949   CREATININE 1.00 12/08/2018 1009   CREATININE 1.0 04/13/2017 1150   CREATININE 0.9 07/09/2016 1038   CALCIUM 9.4 01/30/2019 1949   CALCIUM 9.4 04/13/2017 1150   CALCIUM 9.5 07/09/2016 1038   PROT 6.3 (L) 01/30/2019 1949   PROT 5.9 (L) 09/18/2018 1158   PROT 6.3 (L) 04/13/2017 1150   PROT 6.4 07/09/2016 1038   ALBUMIN 4.1 01/30/2019 1949   ALBUMIN 4.1 09/18/2018 1158   ALBUMIN 3.8 07/09/2016 1038   AST 31 01/30/2019 1949   AST 21 12/08/2018 1009   AST 22 07/09/2016 1038   ALT 19 01/30/2019 1949   ALT 17 12/08/2018 1009   ALT 26 04/13/2017 1150   ALT 22 07/09/2016 1038   ALKPHOS 51 01/30/2019 1949   ALKPHOS 64 04/13/2017 1150   ALKPHOS 85 07/09/2016 1038   BILITOT 0.6 01/30/2019 1949   BILITOT 0.7 12/08/2018 1009   BILITOT 0.62 07/09/2016 1038   GFRNONAA >60  01/30/2019 1949   GFRNONAA >60 12/08/2018 1009   GFRAA >60 01/30/2019 1949   GFRAA >60 12/08/2018 1009   Lab Results  Component Value Date   CHOL 93 (L) 09/18/2018   HDL 30 (L) 09/18/2018   LDLCALC 43 09/18/2018   TRIG 99 09/18/2018   CHOLHDL 3.1 09/18/2018   No results found for: HGBA1C Lab Results  Component Value Date   VITAMINB12 >1999 (H) 02/07/2013   Lab Results  Component Value Date   TSH 4.040 03/14/2018       ASSESSMENT AND PLAN  Transient alteration of awareness - Plan: US Carotid Bilateral  Left carotid bruit - Plan: US Carotid Bilateral  Numbness - Plan: Multiple Myeloma Panel (SPEP&IFE w/QIG), Sjogren's syndrome antibods(ssa + ssb), Rheumatoid factor, Vitamin B12   In summary, Mr. Kearse is a 73 year old man with an episode of transient alteration of awareness 2 days ago lasting just under an hour.  Could represent an episode of transient global amnesia, though he has since recalled some of the details of that hour which would be atypical.  An MRI rule out a stroke.  He does have some scattered T2/flair hyperintense foci in the hemispheres consistent with chronic microvascular ischemic change but he had no acute findings.  He is scheduled to have an EEG in 2 weeks at the hospital.  Unfortunately, we do not have any sooner slots.  I have advised him that he should not drive until we get the results of the EEG back.  He does have a bruit and I will also check a carotid ultrasound.  He had no evidence of significant stenosis in 2013.  Although he has polycythemia vera, hemoglobin was not elevated at the time of the symptoms  A second problem is numbness in the feet.  This is most consistent with a large fiber polyneuropathy..  We will check blood work and do further evaluation if needed.  If symptoms worsen we will check a nerve conduction and EMG study.  We will contact him after the studies and let him know the results.  Further follow-up will be scheduled based on  the results or if symptoms worsen.  He should call us if he has new or worsening neurologic symptoms.  Thank you for asking me to see Mr. Dresch.  Please let me know if I can be of further assistance with him or other patients in the future.       A. Felecia Shelling, MD, [facility] 13/88/7195, 97:47 AM Certified in Neurology, Clinical Neurophysiology, Sleep Medicine and Neuroimaging  [facility] [address] [place], [place] [address] [phone]

## 2019-02-05 ENCOUNTER — Telehealth: Payer: Self-pay | Admitting: *Deleted

## 2019-02-05 ENCOUNTER — Other Ambulatory Visit: Payer: Medicare Other

## 2019-02-05 ENCOUNTER — Telehealth: Payer: Self-pay | Admitting: Neurology

## 2019-02-05 DIAGNOSIS — E349 Endocrine disorder, unspecified: Secondary | ICD-10-CM | POA: Diagnosis not present

## 2019-02-05 DIAGNOSIS — N400 Enlarged prostate without lower urinary tract symptoms: Secondary | ICD-10-CM | POA: Diagnosis not present

## 2019-02-05 DIAGNOSIS — R404 Transient alteration of awareness: Secondary | ICD-10-CM

## 2019-02-05 DIAGNOSIS — I10 Essential (primary) hypertension: Secondary | ICD-10-CM | POA: Diagnosis not present

## 2019-02-05 DIAGNOSIS — R0989 Other specified symptoms and signs involving the circulatory and respiratory systems: Secondary | ICD-10-CM

## 2019-02-05 DIAGNOSIS — R7989 Other specified abnormal findings of blood chemistry: Secondary | ICD-10-CM | POA: Diagnosis not present

## 2019-02-05 DIAGNOSIS — E7800 Pure hypercholesterolemia, unspecified: Secondary | ICD-10-CM | POA: Diagnosis not present

## 2019-02-05 LAB — RHEUMATOID FACTOR: Rheumatoid fact SerPl-aCnc: <10 IU/mL (ref 0.0–13.9)

## 2019-02-05 LAB — SJOGREN'S SYNDROME ANTIBODS(SSA + SSB)
ENA SSA (RO) Ab: <0.2 AI (ref 0.0–0.9)
ENA SSB (LA) Ab: <0.2 AI (ref 0.0–0.9)

## 2019-02-05 LAB — MULTIPLE MYELOMA PANEL, SERUM
Albumin SerPl Elph-Mcnc: 4 g/dL (ref 2.9–4.4)
Albumin/Glob SerPl: 1.9 — ABNORMAL HIGH (ref 0.7–1.7)
Alpha 1: 0.2 g/dL (ref 0.0–0.4)
Alpha2 Glob SerPl Elph-Mcnc: 0.6 g/dL (ref 0.4–1.0)
B-Globulin SerPl Elph-Mcnc: 0.8 g/dL (ref 0.7–1.3)
Gamma Glob SerPl Elph-Mcnc: 0.7 g/dL (ref 0.4–1.8)
Globulin, Total: 2.2 g/dL (ref 2.2–3.9)
IgA/Immunoglobulin A, Serum: 83 mg/dL (ref 61–437)
IgG (Immunoglobin G), Serum: 808 mg/dL (ref 603–1613)
IgM (Immunoglobulin M), Srm: 26 mg/dL (ref 15–143)
Total Protein: 6.2 g/dL (ref 6.0–8.5)

## 2019-02-05 LAB — VITAMIN B12: Vitamin B-12: 921 pg/mL (ref 232–1245)

## 2019-02-05 NOTE — Telephone Encounter (Signed)
-----   Message from Britt Bottom, MD sent at 02/05/2019  5:03 PM EDT ----- Please let the patient know that the lab work is fine.

## 2019-02-05 NOTE — Telephone Encounter (Signed)
Pt called in to check the status on his US Carotid Bilateral

## 2019-02-06 LAB — CMP14+EGFR
ALT: 14 IU/L (ref 0–44)
AST: 17 IU/L (ref 0–40)
Albumin/Globulin Ratio: 1.8 (ref 1.2–2.2)
Albumin: 4.2 g/dL (ref 3.7–4.7)
Alkaline Phosphatase: 71 IU/L (ref 39–117)
BUN/Creatinine Ratio: 17 (ref 10–24)
BUN: 18 mg/dL (ref 8–27)
Bilirubin Total: 0.6 mg/dL (ref 0.0–1.2)
CO2: 27 mmol/L (ref 20–29)
Calcium: 9.3 mg/dL (ref 8.6–10.2)
Chloride: 106 mmol/L (ref 96–106)
Creatinine, Ser: 1.04 mg/dL (ref 0.76–1.27)
GFR calc Af Amer: 81 mL/min/{1.73_m2} (ref >59)
GFR calc non Af Amer: 70 mL/min/{1.73_m2} (ref >59)
Globulin, Total: 2.3 g/dL (ref 1.5–4.5)
Glucose: 128 mg/dL — ABNORMAL HIGH (ref 65–99)
Potassium: 4.2 mmol/L (ref 3.5–5.2)
Sodium: 145 mmol/L — ABNORMAL HIGH (ref 134–144)
Total Protein: 6.5 g/dL (ref 6.0–8.5)

## 2019-02-06 LAB — CBC WITH DIFFERENTIAL/PLATELET
Basophils Absolute: 0.1 10*3/uL (ref 0.0–0.2)
Basos: 1 %
EOS (ABSOLUTE): 0.2 10*3/uL (ref 0.0–0.4)
Eos: 3 %
Hematocrit: 51.1 % — ABNORMAL HIGH (ref 37.5–51.0)
Hemoglobin: 16.8 g/dL (ref 13.0–17.7)
Immature Grans (Abs): 0 10*3/uL (ref 0.0–0.1)
Immature Granulocytes: 0 %
Lymphocytes Absolute: 1.2 10*3/uL (ref 0.7–3.1)
Lymphs: 23 %
MCH: 29.2 pg (ref 26.6–33.0)
MCHC: 32.9 g/dL (ref 31.5–35.7)
MCV: 89 fL (ref 79–97)
Monocytes Absolute: 0.3 10*3/uL (ref 0.1–0.9)
Monocytes: 6 %
Neutrophils Absolute: 3.5 10*3/uL (ref 1.4–7.0)
Neutrophils: 67 %
Platelets: 141 10*3/uL — ABNORMAL LOW (ref 150–450)
RBC: 5.75 x10E6/uL (ref 4.14–5.80)
RDW: 13 % (ref 11.6–15.4)
WBC: 5.3 10*3/uL (ref 3.4–10.8)

## 2019-02-06 LAB — LIPID PANEL
Chol/HDL Ratio: 3.5 ratio (ref 0.0–5.0)
Cholesterol, Total: 108 mg/dL (ref 100–199)
HDL: 31 mg/dL — ABNORMAL LOW (ref >39)
LDL Chol Calc (NIH): 57 mg/dL (ref 0–99)
Triglycerides: 108 mg/dL (ref 0–149)
VLDL Cholesterol Cal: 20 mg/dL (ref 5–40)

## 2019-02-06 LAB — TESTOSTERONE,FREE AND TOTAL
Testosterone, Free: 9.1 pg/mL (ref 6.6–18.1)
Testosterone: 376 ng/dL (ref 264–916)

## 2019-02-06 LAB — PSA, TOTAL AND FREE
PSA, Free Pct: 36.8 %
PSA, Free: 0.7 ng/mL
Prostate Specific Ag, Serum: 1.9 ng/mL (ref 0.0–4.0)

## 2019-02-06 NOTE — Telephone Encounter (Signed)
Order sent for scheduling NO PA needed [phone].

## 2019-02-06 NOTE — Addendum Note (Signed)
Addended by: Rossie Muskrat L on: 02/06/2019 01:30 PM   Modules accepted: Orders

## 2019-02-08 ENCOUNTER — Other Ambulatory Visit: Payer: Self-pay

## 2019-02-08 ENCOUNTER — Telehealth: Payer: Self-pay | Admitting: *Deleted

## 2019-02-08 ENCOUNTER — Ambulatory Visit (HOSPITAL_COMMUNITY)
Admission: RE | Admit: 2019-02-08 | Discharge: 2019-02-08 | Disposition: A | Discharge status: Home / Self Care | Payer: Medicare Other | Source: Ambulatory Visit | Attending: Neurology | Admitting: Neurology

## 2019-02-08 DIAGNOSIS — R0989 Other specified symptoms and signs involving the circulatory and respiratory systems: Secondary | ICD-10-CM | POA: Insufficient documentation

## 2019-02-08 DIAGNOSIS — R404 Transient alteration of awareness: Secondary | ICD-10-CM | POA: Insufficient documentation

## 2019-02-08 NOTE — Telephone Encounter (Signed)
-----   Message from Britt Bottom, MD sent at 02/08/2019  5:18 PM EDT ----- Please let him know that the carotid Doppler studies look good.  The arteries are normal for age

## 2019-02-08 NOTE — Telephone Encounter (Addendum)
Left patient a detailed message, with results, on voicemail.  Provided our number to call back with any questions.  

## 2019-02-09 LAB — HGB A1C W/O EAG: Hgb A1c MFr Bld: 6.1 % — ABNORMAL HIGH (ref 4.8–5.6)

## 2019-02-09 LAB — SPECIMEN STATUS REPORT

## 2019-02-12 ENCOUNTER — Other Ambulatory Visit: Payer: Self-pay

## 2019-02-12 ENCOUNTER — Encounter: Payer: Self-pay | Admitting: Family Medicine

## 2019-02-12 ENCOUNTER — Ambulatory Visit (INDEPENDENT_AMBULATORY_CARE_PROVIDER_SITE_OTHER): Payer: Medicare Other | Admitting: Family Medicine

## 2019-02-12 VITALS — BP 125/71 | HR 78 | Temp 97.5°F | Ht 71.5 in | Wt 234.8 lb

## 2019-02-12 DIAGNOSIS — R7303 Prediabetes: Secondary | ICD-10-CM

## 2019-02-12 DIAGNOSIS — E785 Hyperlipidemia, unspecified: Secondary | ICD-10-CM

## 2019-02-12 DIAGNOSIS — I10 Essential (primary) hypertension: Secondary | ICD-10-CM

## 2019-02-12 DIAGNOSIS — N400 Enlarged prostate without lower urinary tract symptoms: Secondary | ICD-10-CM

## 2019-02-12 DIAGNOSIS — J449 Chronic obstructive pulmonary disease, unspecified: Secondary | ICD-10-CM | POA: Diagnosis not present

## 2019-02-12 DIAGNOSIS — D500 Iron deficiency anemia secondary to blood loss (chronic): Secondary | ICD-10-CM

## 2019-02-12 NOTE — Progress Notes (Signed)
BP 125/71   Pulse 78   Temp (!) 97.5 F (36.4 C) (Temporal)   Ht 5' 11.5" (1.816 m)   Wt 234 lb 12.8 oz (106.5 kg)   SpO2 95%   BMI 32.29 kg/m    Subjective:   Patient ID: Connor [name], male    DOB: 1945-06-27, 73 y.o.   MRN: [id number]  HPI: Connor [name] is a 73 y.o. male presenting on 02/12/2019 for Establish Care (6 month follow up)   HPI Prediabetes Patient comes in today for recheck of his diabetes. Patient has been currently taking no medication currently we are monitoring. Patient is currently on an ACE inhibitor/ARB. Patient has not seen an ophthalmologist this year. Patient denies any issues with their feet.   Hypertension Patient is currently on losartan, and their blood pressure today is 125/71. Patient denies any lightheadedness or dizziness. Patient denies headaches, blurred vision, chest pains, shortness of breath, or weakness. Denies any side effects from medication and is content with current medication.   Hyperlipidemia Patient is coming in for recheck of his hyperlipidemia. The patient is currently taking Vascepa and Crestor. They deny any issues with myalgias or history of liver damage from it. They deny any focal numbness or weakness or chest pain.   COPD Patient is coming in for COPD recheck today.  He is currently on Combivent and Singulair.  He has a mild chronic cough but denies any major coughing spells or wheezing spells.  He has 0nighttime symptoms per week and 1daytime symptoms per week currently.   Patient has a history of BPH.  He denies any urinary issues or complaints today.  Patient currently takes Uroxatrol for this and feels like is working very well.  He does have a urologist.  Patient has a history of iron deficiency anemia secondary to chronic blood loss in his history, he denies any bleeding currently but will check it today.  He denies any lightheadedness or dizziness.  Relevant past medical, surgical, family and social history  reviewed and updated as indicated. Interim medical history since our last visit reviewed. Allergies and medications reviewed and updated.  Review of Systems  Constitutional: Negative for chills and fever.  Eyes: Negative for discharge.  Respiratory: Negative for shortness of breath and wheezing.   Cardiovascular: Negative for chest pain and leg swelling.  Genitourinary: Negative for decreased urine volume, difficulty urinating, flank pain, frequency and urgency.  Musculoskeletal: Negative for back pain and gait problem.  Skin: Negative for rash.  All other systems reviewed and are negative.   Per HPI unless specifically indicated above   Allergies as of 02/12/2019      Reactions   Dexlansoprazole Nausea And Vomiting   Celecoxib Other (See Comments)   Solid white - generic pill only = caused diarrhea (other generics ok)  Solid white - generic pill only = caused diarrhea (other generics ok)       Medication List       Accurate as of February 12, 2019  4:30 PM. If you have any questions, ask your nurse or doctor.        alfuzosin 10 MG 24 hr tablet Commonly known as: UROXATRAL Take 10 mg by mouth every morning.   aspirin 81 MG tablet Take 81 mg by mouth 2 (two) times daily.   Centrum Silver Ultra Mens Tabs Take 1 tablet by mouth every morning. Once a day   cetirizine 10 MG tablet Commonly known as: ZYRTEC Take 10 mg by mouth  daily.   CO Q 10 PO Take 1 capsule by mouth daily.   cyclobenzaprine 10 MG tablet Commonly known as: FLEXERIL Take 1 tablet (10 mg total) by mouth 3 (three) times daily as needed for muscle spasms.   ferrous sulfate 325 (65 FE) MG tablet Take 1 tablet (325 mg total) by mouth daily with breakfast.   Glucosamine Chondr 1500 Complx Caps Take 1 capsule by mouth daily.   Icosapent Ethyl 1 g Caps Commonly known as: Vascepa Take 2 capsules (2 g total) by mouth 2 (two) times daily.   Ipratropium-Albuterol 20-100 MCG/ACT Aers respimat Commonly  known as: COMBIVENT Inhale 1 puff into the lungs as needed for wheezing or shortness of breath.   losartan 50 MG tablet Commonly known as: COZAAR Take 1 tablet (50 mg total) by mouth daily. What changed: how much to take   montelukast 10 MG tablet Commonly known as: SINGULAIR Take 1 tablet (10 mg total) by mouth at bedtime.   PHILLIPS COLON HEALTH PO Take 1 capsule by mouth daily.   pyridOXINE 100 MG tablet Commonly known as: VITAMIN B-6 Take 100 mg by mouth daily.   rosuvastatin 10 MG tablet Commonly known as: CRESTOR TAKE 1 TABLET (10 MG TOTAL) BY MOUTH DAILY. What changed:   how much to take  how to take this  when to take this  additional instructions   sildenafil 20 MG tablet Commonly known as: REVATIO Take 2-5 tabs prior to sexual activity What changed:   how much to take  how to take this  when to take this  reasons to take this  additional instructions   Turmeric 500 MG Caps Take 1 tablet by mouth daily.   vitamin B-12 500 MCG tablet Commonly known as: CYANOCOBALAMIN Take 500 mcg by mouth daily.   Vitamin D-3 125 MCG (5000 UT) Tabs Take 1 tablet by mouth daily.        Objective:   BP 125/71   Pulse 78   Temp (!) 97.5 F (36.4 C) (Temporal)   Ht 5' 11.5" (1.816 m)   Wt 234 lb 12.8 oz (106.5 kg)   SpO2 95%   BMI 32.29 kg/m   Wt Readings from Last 3 Encounters:  02/12/19 234 lb 12.8 oz (106.5 kg)  02/01/19 233 lb 8 oz (105.9 kg)  01/30/19 230 lb (104.3 kg)    Physical Exam Vitals signs and nursing note reviewed.  Constitutional:      General: He is not in acute distress.    Appearance: He is well-developed. He is not diaphoretic.  Eyes:     General: No scleral icterus.    Conjunctiva/sclera: Conjunctivae normal.  Neck:     Musculoskeletal: Neck supple.     Thyroid: No thyromegaly.  Cardiovascular:     Rate and Rhythm: Normal rate and regular rhythm.     Heart sounds: Normal heart sounds. No murmur.  Pulmonary:      Effort: Pulmonary effort is normal. No respiratory distress.     Breath sounds: Normal breath sounds. No wheezing.  Musculoskeletal: Normal range of motion.  Lymphadenopathy:     Cervical: No cervical adenopathy.  Skin:    General: Skin is warm and dry.     Findings: No rash.  Neurological:     Mental Status: He is alert and oriented to person, place, and time.     Coordination: Coordination normal.  Psychiatric:        Behavior: Behavior normal.    No change in current  medication, blood work looks good.  Patient's A1c last week was 6.1.  Everything else including his PSA looks great   Assessment & Plan:   Problem List Items Addressed This Visit      Cardiovascular and Mediastinum   ESSENTIAL HYPERTENSION, BENIGN - Primary     Respiratory   COPD, mild (HCC) (Chronic)     Genitourinary   BPH (benign prostatic hyperplasia)     Other   Iron deficiency anemia   Hyperlipidemia LDL goal <100   Prediabetes      No change in current medication Follow up plan: Return in about 6 months (around 08/12/2019), or if symptoms worsen or fail to improve, for htn and copd and prediabetes.  Counseling provided for all of the vaccine components No orders of the defined types were placed in this encounter.   Caryl Pina, MD [facility] 02/12/2019, 4:30 PM

## 2019-02-14 ENCOUNTER — Other Ambulatory Visit: Payer: Self-pay

## 2019-02-14 ENCOUNTER — Ambulatory Visit (INDEPENDENT_AMBULATORY_CARE_PROVIDER_SITE_OTHER): Payer: Medicare Other | Admitting: Neurology

## 2019-02-14 DIAGNOSIS — R4182 Altered mental status, unspecified: Secondary | ICD-10-CM

## 2019-02-14 DIAGNOSIS — R404 Transient alteration of awareness: Secondary | ICD-10-CM

## 2019-02-21 NOTE — Progress Notes (Signed)
   [facility]  EEG (ELECTROENCEPHALOGRAM) REPORT   STUDY DATE: 02/14/2019 PATIENT NAME: Connor [name] DOB: 1946-03-20 MRN: [id number]  ORDERING CLINICIAN: Anaelle Dunton A. Felecia Shelling, MD. PhD  TECHNOLOGIST: Nonda Lou, RPSGT TECHNIQUE: Electroencephalogram was recorded utilizing standard 10-20 system of lead placement and reformatted into average and bipolar montages.  RECORDING TIME: 24 minutes and 39 seconds  CLINICAL INFORMATION: 73 year old man with episode of transient alteration of awareness  FINDINGS: A digital EEG was performed while the patient was awake and drowsy. While awake and most alert there was a 10 hz posterior dominant rhythm. Voltages and frequencies were symmetric.  There were no focal, lateralizing, epileptiform activity or seizures seen.  Photic stimulation had a normal driving response. Hyperventilation and recovery did not change the underlying rhythms. EKG channel shows normal sinus rhythm.  IMPRESSION: This is a normal EEG while the patient was awake and drowsy.   INTERPRETING PHYSICIAN:   Kasin Tonkinson A. Felecia Shelling, MD, PhD, [employer] Certified in Neurology, Clinical Neurophysiology, Sleep Medicine, Pain Medicine and Neuroimaging  [facility] [address] [place], [place] [address] [phone]

## 2019-02-22 ENCOUNTER — Telehealth: Payer: Self-pay | Admitting: *Deleted

## 2019-02-22 DIAGNOSIS — R2232 Localized swelling, mass and lump, left upper limb: Secondary | ICD-10-CM | POA: Diagnosis not present

## 2019-02-22 DIAGNOSIS — M79645 Pain in left finger(s): Secondary | ICD-10-CM | POA: Diagnosis not present

## 2019-02-22 NOTE — Telephone Encounter (Signed)
Called and spoke with pt. Relayed EEG normal per Dr. Felecia Shelling. Advised his to call if he has any new or worsening symptoms. He should f/u with Dr. Felecia Shelling as needed per OV note. He verbalized understanding.

## 2019-02-22 NOTE — Telephone Encounter (Signed)
-----   Message from Britt Bottom, MD sent at 02/21/2019  7:02 PM EST ----- Regarding: eeg Please let him know that the EEG was normal.

## 2019-02-23 ENCOUNTER — Encounter: Payer: Self-pay | Admitting: Hematology & Oncology

## 2019-02-23 ENCOUNTER — Inpatient Hospital Stay: Payer: Medicare Other | Attending: Hematology & Oncology

## 2019-02-23 ENCOUNTER — Other Ambulatory Visit: Payer: Self-pay

## 2019-02-23 ENCOUNTER — Inpatient Hospital Stay: Payer: Medicare Other

## 2019-02-23 ENCOUNTER — Inpatient Hospital Stay (HOSPITAL_BASED_OUTPATIENT_CLINIC_OR_DEPARTMENT_OTHER): Payer: Medicare Other | Admitting: Hematology & Oncology

## 2019-02-23 VITALS — BP 136/74 | HR 78

## 2019-02-23 VITALS — BP 135/70 | HR 78 | Temp 97.1°F | Resp 18 | Wt 233.0 lb

## 2019-02-23 DIAGNOSIS — D45 Polycythemia vera: Secondary | ICD-10-CM

## 2019-02-23 DIAGNOSIS — K909 Intestinal malabsorption, unspecified: Secondary | ICD-10-CM

## 2019-02-23 DIAGNOSIS — D508 Other iron deficiency anemias: Secondary | ICD-10-CM

## 2019-02-23 DIAGNOSIS — Z7982 Long term (current) use of aspirin: Secondary | ICD-10-CM | POA: Diagnosis not present

## 2019-02-23 LAB — CMP (CANCER CENTER ONLY)
ALT: 13 U/L (ref 0–44)
AST: 18 U/L (ref 15–41)
Albumin: 4.4 g/dL (ref 3.5–5.0)
Alkaline Phosphatase: 65 U/L (ref 38–126)
Anion gap: 8 (ref 5–15)
BUN: 18 mg/dL (ref 8–23)
CO2: 26 mmol/L (ref 22–32)
Calcium: 9.2 mg/dL (ref 8.9–10.3)
Chloride: 108 mmol/L (ref 98–111)
Creatinine: 1.08 mg/dL (ref 0.61–1.24)
GFR, Est AFR Am: >60 mL/min (ref >60)
GFR, Estimated: >60 mL/min (ref >60)
Glucose, Bld: 105 mg/dL — ABNORMAL HIGH (ref 70–99)
Potassium: 4.3 mmol/L (ref 3.5–5.1)
Sodium: 142 mmol/L (ref 135–145)
Total Bilirubin: 0.6 mg/dL (ref 0.3–1.2)
Total Protein: 6.3 g/dL — ABNORMAL LOW (ref 6.5–8.1)

## 2019-02-23 LAB — CBC WITH DIFFERENTIAL (CANCER CENTER ONLY)
Abs Immature Granulocytes: 0.02 10*3/uL (ref 0.00–0.07)
Basophils Absolute: 0.1 10*3/uL (ref 0.0–0.1)
Basophils Relative: 1 %
Eosinophils Absolute: 0.2 10*3/uL (ref 0.0–0.5)
Eosinophils Relative: 3 %
HCT: 49.8 % (ref 39.0–52.0)
Hemoglobin: 16.5 g/dL (ref 13.0–17.0)
Immature Granulocytes: 0 %
Lymphocytes Relative: 14 %
Lymphs Abs: 1 10*3/uL (ref 0.7–4.0)
MCH: 29.6 pg (ref 26.0–34.0)
MCHC: 33.1 g/dL (ref 30.0–36.0)
MCV: 89.2 fL (ref 80.0–100.0)
Monocytes Absolute: 0.5 10*3/uL (ref 0.1–1.0)
Monocytes Relative: 6 %
Neutro Abs: 5.5 10*3/uL (ref 1.7–7.7)
Neutrophils Relative %: 76 %
Platelet Count: 137 10*3/uL — ABNORMAL LOW (ref 150–400)
RBC: 5.58 MIL/uL (ref 4.22–5.81)
RDW: 12.9 % (ref 11.5–15.5)
WBC Count: 7.2 10*3/uL (ref 4.0–10.5)
nRBC: 0 % (ref 0.0–0.2)

## 2019-02-23 LAB — IRON AND TIBC
Iron: 134 ug/dL (ref 42–163)
Saturation Ratios: 42 % (ref 20–55)
TIBC: 319 ug/dL (ref 202–409)
UIBC: 185 ug/dL (ref 117–376)

## 2019-02-23 LAB — FERRITIN: Ferritin: 23 ng/mL — ABNORMAL LOW (ref 24–336)

## 2019-02-23 NOTE — Progress Notes (Signed)
Hematology and Oncology Follow Up Visit  Connor [name] [id number] 1945-06-29 73 y.o. 02/23/2019   Principle Diagnosis:   Polycythemia vera-Triple negative  Chronic immune thrombocytopenia vs medication  Transient iron deficiency secondary to phlebotomies  Current Therapy:    Phlebotomy to maintain hematocrit below 45%  Aspirin 81 mg by mouth twice daily   IV iron as indicated - last dose given on 06/02/2016     Interim History:  Mr.  Grainer is back for followup.  Unfortunately, looks like he may have some type of ischemic event with his brain.  This happened a couple weeks ago.  He went to the emergency room.  Had a very thorough work-up.  He has seen neurology.  From what he tells me, the neurologist thinks he has an episode of transient global amnesia.  At the time, his hematocrit was 51.1.  He has been donating blood to the [facility].  I think were going to have to have him phlebotomized here.  We really need to get his hematocrit down below 45.  He is taking aspirin.  He feels well now.  He had no problems with weakness.  He had no problems with nausea or vomiting.  He had no verbal issues.  He had no incontinence.  He had no numbness in his extremities.  Again, I really have to believe that part of this was related to him having a high hematocrit.  He has had no itching.  He has been playing golf.  He has been been doing yard work.  Overall, his performance status is ECOG 0.   Medications:  Current Outpatient Medications:    alfuzosin (UROXATRAL) 10 MG 24 hr tablet, Take 10 mg by mouth every morning. , Disp: , Rfl:    aspirin 81 MG tablet, Take 81 mg by mouth 2 (two) times daily. , Disp: , Rfl:    cetirizine (ZYRTEC) 10 MG tablet, Take 10 mg by mouth daily., Disp: , Rfl:    Cholecalciferol (VITAMIN D-3) 5000 UNITS TABS, Take 1 tablet by mouth daily. , Disp: , Rfl:    Coenzyme Q10 (CO Q 10 PO), Take 1 capsule by mouth daily. , Disp: , Rfl:    cyclobenzaprine  (FLEXERIL) 10 MG tablet, Take 1 tablet (10 mg total) by mouth 3 (three) times daily as needed for muscle spasms., Disp: 30 tablet, Rfl: 3   ferrous sulfate 325 (65 FE) MG tablet, Take 1 tablet (325 mg total) by mouth daily with breakfast., Disp: 90 tablet, Rfl: 3   Glucosamine-Chondroit-Vit C-Mn (GLUCOSAMINE CHONDR 1500 COMPLX) CAPS, Take 1 capsule by mouth daily. , Disp: , Rfl:    Icosapent Ethyl (VASCEPA) 1 g CAPS, Take 2 capsules (2 g total) by mouth 2 (two) times daily., Disp: 360 capsule, Rfl: 3   Ipratropium-Albuterol (COMBIVENT) 20-100 MCG/ACT AERS respimat, Inhale 1 puff into the lungs as needed for wheezing or shortness of breath., Disp: 3 Inhaler, Rfl: 3   losartan (COZAAR) 50 MG tablet, Take 1 tablet (50 mg total) by mouth daily. (Patient taking differently: Take 25 mg by mouth daily. ), Disp: 90 tablet, Rfl: 3   montelukast (SINGULAIR) 10 MG tablet, Take 1 tablet (10 mg total) by mouth at bedtime., Disp: 90 tablet, Rfl: 3   Multiple Vitamins-Minerals (CENTRUM SILVER ULTRA MENS) TABS, Take 1 tablet by mouth every morning. Once a day, Disp: , Rfl:    Probiotic Product ([place]), Take 1 capsule by mouth daily. , Disp: , Rfl:  pyridOXINE (VITAMIN B-6) 100 MG tablet, Take 100 mg by mouth daily. , Disp: , Rfl:    rosuvastatin (CRESTOR) 10 MG tablet, TAKE 1 TABLET (10 MG TOTAL) BY MOUTH DAILY. (Patient taking differently: Take 10 mg by mouth daily. ), Disp: 90 tablet, Rfl: 3   sildenafil (REVATIO) 20 MG tablet, Take 2-5 tabs prior to sexual activity (Patient taking differently: Take 20 mg by mouth 3 (three) times daily as needed (For sexual activity). ), Disp: 50 tablet, Rfl: 1   Turmeric 500 MG CAPS, Take 1 tablet by mouth daily. , Disp: , Rfl:    vitamin B-12 (CYANOCOBALAMIN) 500 MCG tablet, Take 500 mcg by mouth daily., Disp: , Rfl:   Allergies:  Allergies  Allergen Reactions   Dexlansoprazole Nausea And Vomiting   Celecoxib Other (See Comments)    Solid  white - generic pill only = caused diarrhea (other generics ok)  Solid white - generic pill only = caused diarrhea (other generics ok)     Past Medical History, Surgical history, Social history, and Family History were reviewed and updated.  Review of Systems: Review of Systems  Constitutional: Negative.   HENT: Negative.   Eyes: Negative.   Respiratory: Negative.   Cardiovascular: Negative.   Gastrointestinal: Negative.   Genitourinary: Negative.   Musculoskeletal: Negative.   Skin: Negative.   Neurological: Negative.   Endo/Heme/Allergies: Negative.   Psychiatric/Behavioral: Negative.  Negative for depression.     Physical Exam:  weight is 233 lb (105.7 kg). His temporal temperature is 97.1 F (36.2 C) (abnormal). His blood pressure is 135/70 and his pulse is 78. His respiration is 18 and oxygen saturation is 96%.  Saturday and Sunday in [place] and on he was in [place] over the next couple weeks [name].  [place]  Physical Exam Vitals signs reviewed.  HENT:     Head: Normocephalic and atraumatic.  Eyes:     Pupils: Pupils are equal, round, and reactive to light.  Neck:     Musculoskeletal: Normal range of motion.  Cardiovascular:     Rate and Rhythm: Normal rate and regular rhythm.     Heart sounds: Normal heart sounds.  Pulmonary:     Effort: Pulmonary effort is normal.     Breath sounds: Normal breath sounds.  Abdominal:     General: Bowel sounds are normal.     Palpations: Abdomen is soft.  Musculoskeletal: Normal range of motion.        General: No tenderness or deformity.  Lymphadenopathy:     Cervical: No cervical adenopathy.  Skin:    General: Skin is warm and dry.     Findings: No erythema or rash.  Neurological:     Mental Status: He is alert and oriented to person, place, and time.  Psychiatric:        Behavior: Behavior normal.        Thought Content: Thought content normal.        Judgment: Judgment normal.     Lab  Results  Component Value Date   WBC 7.2 02/23/2019   HGB 16.5 02/23/2019   HCT 49.8 02/23/2019   MCV 89.2 02/23/2019   PLT 137 (L) 02/23/2019     Chemistry      Component Value Date/Time   NA 145 (H) 02/05/2019 0930   NA 139 04/13/2017 1150   NA 141 07/09/2016 1038   K 4.2 02/05/2019 0930   K 4.0 04/13/2017 1150   K 4.0  07/09/2016 1038   CL 106 02/05/2019 0930   CL 104 04/13/2017 1150   CO2 27 02/05/2019 0930   CO2 26 04/13/2017 1150   CO2 22 07/09/2016 1038   BUN 18 02/05/2019 0930   BUN 17 04/13/2017 1150   BUN 19.0 07/09/2016 1038   CREATININE 1.04 02/05/2019 0930   CREATININE 1.00 12/08/2018 1009   CREATININE 1.0 04/13/2017 1150   CREATININE 0.9 07/09/2016 1038      Component Value Date/Time   CALCIUM 9.3 02/05/2019 0930   CALCIUM 9.4 04/13/2017 1150   CALCIUM 9.5 07/09/2016 1038   ALKPHOS 71 02/05/2019 0930   ALKPHOS 64 04/13/2017 1150   ALKPHOS 85 07/09/2016 1038   AST 17 02/05/2019 0930   AST 21 12/08/2018 1009   AST 22 07/09/2016 1038   ALT 14 02/05/2019 0930   ALT 17 12/08/2018 1009   ALT 26 04/13/2017 1150   ALT 22 07/09/2016 1038   BILITOT 0.6 02/05/2019 0930   BILITOT 0.7 12/08/2018 1009   BILITOT 0.62 07/09/2016 1038         Impression and Plan: Mr. Burgy is a 74 year old gentleman with polycythemia.  Again, I think this cerebrovascular issue really dictate how we have to be more aggressive now.  I know that he has been going to the [facility].  I have no problems with him going to the [facility] but he can only go there every 2 months.  I will phlebotomize him today.  I will phlebotomize him next week.  I will then get him back to see me in a month.  Again, we will have to be incredibly aggressive and try to force his hematocrit below 45%.  I had to spend 35 minutes with him today.  I had to go over all the records with the emergency room.  I had to review his labs and x-rays.    Volanda Napoleon, MD 11/13/20209:39 AM

## 2019-02-23 NOTE — Patient Instructions (Signed)
Therapeutic Phlebotomy Therapeutic phlebotomy is the planned removal of blood from a person's body for the purpose of treating a medical condition. The procedure is similar to donating blood. Usually, about a pint (470 mL, or 0.47 L) of blood is removed. The average adult has 9-12 pints (4.3-5.7 L) of blood in the body. Therapeutic phlebotomy may be used to treat the following medical conditions:  Hemochromatosis. This is a condition in which the blood contains too much iron.  Polycythemia vera. This is a condition in which the blood contains too many red blood cells.  Porphyria cutanea tarda. This is a disease in which an important part of hemoglobin is not made properly. It results in the buildup of abnormal amounts of porphyrins in the body.  Sickle cell disease. This is a condition in which the red blood cells form an abnormal crescent shape rather than a round shape. Tell a health care provider about:  Any allergies you have.  All medicines you are taking, including vitamins, herbs, eye drops, creams, and over-the-counter medicines.  Any problems you or family members have had with anesthetic medicines.  Any blood disorders you have.  Any surgeries you have had.  Any medical conditions you have.  Whether you are pregnant or may be pregnant. What are the risks? Generally, this is a safe procedure. However, problems may occur, including:  Nausea or light-headedness.  Low blood pressure (hypotension).  Soreness, bleeding, swelling, or bruising at the needle insertion site.  Infection. What happens before the procedure?  Follow instructions from your health care provider about eating or drinking restrictions.  Ask your health care provider about: ? Changing or stopping your regular medicines. This is especially important if you are taking diabetes medicines or blood thinners (anticoagulants). ? Taking medicines such as aspirin and ibuprofen. These medicines can thin your  blood. Do not take these medicines unless your health care provider tells you to take them. ? Taking over-the-counter medicines, vitamins, herbs, and supplements.  Wear clothing with sleeves that can be raised above the elbow.  Plan to have someone take you home from the hospital or clinic.  You may have a blood sample taken.  Your blood pressure, pulse rate, and breathing rate will be measured. What happens during the procedure?   To lower your risk of infection: ? Your health care team will wash or sanitize their hands. ? Your skin will be cleaned with an antiseptic.  You may be given a medicine to numb the area (local anesthetic).  A tourniquet will be placed on your arm.  A needle will be inserted into one of your veins.  Tubing and a collection bag will be attached to that needle.  Blood will flow through the needle and tubing into the collection bag.  The collection bag will be placed lower than your arm to allow gravity to help the flow of blood into the bag.  You may be asked to open and close your hand slowly and continually during the entire collection.  After the specified amount of blood has been removed from your body, the collection bag and tubing will be clamped.  The needle will be removed from your vein.  Pressure will be held on the site of the needle insertion to stop the bleeding.  A bandage (dressing) will be placed over the needle insertion site. The procedure may vary among health care providers and hospitals. What happens after the procedure?  Your blood pressure, pulse rate, and breathing rate will be   measured after the procedure.  You will be encouraged to drink fluids.  Your recovery will be assessed and monitored.  You can return to your normal activities as told by your health care provider. Summary  Therapeutic phlebotomy is the planned removal of blood from a person's body for the purpose of treating a medical condition.  Therapeutic  phlebotomy may be used to treat hemochromatosis, polycythemia vera, porphyria cutanea tarda, or sickle cell disease.  In the procedure, a needle is inserted and about a pint (470 mL, or 0.47 L) of blood is removed. The average adult has 9-12 pints (4.3-5.7 L) of blood in the body.  This is generally a safe procedure, but it can sometimes cause problems such as nausea, light-headedness, or low blood pressure (hypotension). This information is not intended to replace advice given to you by your health care provider. Make sure you discuss any questions you have with your health care provider. Document Released: 08/31/2010 Document Revised: 04/14/2017 Document Reviewed: 04/14/2017 Elsevier Patient Education  2020 Elsevier Inc.  

## 2019-03-02 ENCOUNTER — Other Ambulatory Visit: Payer: Self-pay

## 2019-03-02 ENCOUNTER — Inpatient Hospital Stay: Payer: Medicare Other

## 2019-03-02 VITALS — BP 133/73 | HR 75 | Temp 96.4°F | Resp 16

## 2019-03-02 DIAGNOSIS — D45 Polycythemia vera: Secondary | ICD-10-CM

## 2019-03-02 DIAGNOSIS — Z7982 Long term (current) use of aspirin: Secondary | ICD-10-CM | POA: Diagnosis not present

## 2019-03-02 NOTE — Patient Instructions (Signed)

## 2019-03-02 NOTE — Progress Notes (Signed)
Connor [name] presents today for phlebotomy per MD orders. Phlebotomy procedure started at 0910 and ended at 0920 with  500 grams removed via 16G kit to [place]. Patient observed for 30 minutes after procedure without any incident. Patient tolerated procedure well. Diet and nutrition offered.

## 2019-03-13 HISTORY — PX: DG THUMB LEFT HAND: HXRAD1658

## 2019-03-14 DIAGNOSIS — Z4789 Encounter for other orthopedic aftercare: Secondary | ICD-10-CM | POA: Diagnosis not present

## 2019-03-14 DIAGNOSIS — M79642 Pain in left hand: Secondary | ICD-10-CM | POA: Diagnosis not present

## 2019-03-26 ENCOUNTER — Other Ambulatory Visit: Payer: Medicare Other

## 2019-03-26 ENCOUNTER — Ambulatory Visit: Payer: Medicare Other | Admitting: Hematology & Oncology

## 2019-04-05 ENCOUNTER — Ambulatory Visit: Payer: Medicare Other | Admitting: Hematology & Oncology

## 2019-04-05 ENCOUNTER — Other Ambulatory Visit: Payer: Medicare Other

## 2019-04-10 ENCOUNTER — Inpatient Hospital Stay (HOSPITAL_BASED_OUTPATIENT_CLINIC_OR_DEPARTMENT_OTHER): Payer: Medicare Other | Admitting: Hematology & Oncology

## 2019-04-10 ENCOUNTER — Encounter: Payer: Self-pay | Admitting: Hematology & Oncology

## 2019-04-10 ENCOUNTER — Inpatient Hospital Stay: Payer: Medicare Other | Attending: Hematology & Oncology

## 2019-04-10 ENCOUNTER — Other Ambulatory Visit: Payer: Self-pay

## 2019-04-10 VITALS — BP 153/75 | HR 82 | Temp 96.9°F | Wt 232.0 lb

## 2019-04-10 DIAGNOSIS — D45 Polycythemia vera: Secondary | ICD-10-CM

## 2019-04-10 LAB — CMP (CANCER CENTER ONLY)
ALT: 13 U/L (ref 0–44)
AST: 17 U/L (ref 15–41)
Albumin: 4.1 g/dL (ref 3.5–5.0)
Alkaline Phosphatase: 53 U/L (ref 38–126)
Anion gap: 7 (ref 5–15)
BUN: 21 mg/dL (ref 8–23)
CO2: 26 mmol/L (ref 22–32)
Calcium: 9.2 mg/dL (ref 8.9–10.3)
Chloride: 108 mmol/L (ref 98–111)
Creatinine: 0.95 mg/dL (ref 0.61–1.24)
GFR, Est AFR Am: >60 mL/min (ref >60)
GFR, Estimated: >60 mL/min (ref >60)
Glucose, Bld: 137 mg/dL — ABNORMAL HIGH (ref 70–99)
Potassium: 3.7 mmol/L (ref 3.5–5.1)
Sodium: 141 mmol/L (ref 135–145)
Total Bilirubin: 0.6 mg/dL (ref 0.3–1.2)
Total Protein: 6.1 g/dL — ABNORMAL LOW (ref 6.5–8.1)

## 2019-04-10 LAB — CBC WITH DIFFERENTIAL (CANCER CENTER ONLY)
Abs Immature Granulocytes: 0.02 10*3/uL (ref 0.00–0.07)
Basophils Absolute: 0.1 10*3/uL (ref 0.0–0.1)
Basophils Relative: 1 %
Eosinophils Absolute: 0.2 10*3/uL (ref 0.0–0.5)
Eosinophils Relative: 4 %
HCT: 44.2 % (ref 39.0–52.0)
Hemoglobin: 14.7 g/dL (ref 13.0–17.0)
Immature Granulocytes: 0 %
Lymphocytes Relative: 23 %
Lymphs Abs: 1.1 10*3/uL (ref 0.7–4.0)
MCH: 30 pg (ref 26.0–34.0)
MCHC: 33.3 g/dL (ref 30.0–36.0)
MCV: 90.2 fL (ref 80.0–100.0)
Monocytes Absolute: 0.3 10*3/uL (ref 0.1–1.0)
Monocytes Relative: 5 %
Neutro Abs: 3.2 10*3/uL (ref 1.7–7.7)
Neutrophils Relative %: 67 %
Platelet Count: 138 10*3/uL — ABNORMAL LOW (ref 150–400)
RBC: 4.9 MIL/uL (ref 4.22–5.81)
RDW: 12.5 % (ref 11.5–15.5)
WBC Count: 4.8 10*3/uL (ref 4.0–10.5)
nRBC: 0 % (ref 0.0–0.2)

## 2019-04-10 LAB — IRON AND TIBC
Iron: 67 ug/dL (ref 42–163)
Saturation Ratios: 20 % (ref 20–55)
TIBC: 335 ug/dL (ref 202–409)
UIBC: 269 ug/dL (ref 117–376)

## 2019-04-10 LAB — FERRITIN: Ferritin: 12 ng/mL — ABNORMAL LOW (ref 24–336)

## 2019-04-10 NOTE — Progress Notes (Signed)
Hematology and Oncology Follow Up Visit  Connor [name] [id number] [birth year] 73 y.o. 04/10/2019   Principle Diagnosis:   Polycythemia vera-Triple negative  Chronic immune thrombocytopenia vs medication  Transient iron deficiency secondary to phlebotomies  Current Therapy:    Phlebotomy to maintain hematocrit below 45%  Aspirin 81 mg by mouth twice daily   IV iron as indicated - last dose given on 06/02/2016     Interim History:  Connor [name] is back for followup.  Everything is going quite well for him.  He had a very nice Thanksgiving.  Had a very nice Christmas.  Unfortunately, there was some exposure in his family to the coronavirus.  Thankfully, he has been okay.  He goes to the [facility] and donates blood.  He just had a donation last week.  He has had no problems with itching.  He has had no problems with fatigue or weakness.  He has had no change in bowel or bladder habits.  There has been no nausea or vomiting.  He has had no cough.  There is been no headache.  Overall, his performance status is ECOG 0.   Medications:  Current Outpatient Medications:  .  alfuzosin (UROXATRAL) 10 MG 24 hr tablet, Take 10 mg by mouth every morning. , Disp: , Rfl:  .  aspirin 81 MG tablet, Take 81 mg by mouth 2 (two) times daily. , Disp: , Rfl:  .  cetirizine (ZYRTEC) 10 MG tablet, Take 10 mg by mouth daily., Disp: , Rfl:  .  Cholecalciferol (VITAMIN D-3) 5000 UNITS TABS, Take 1 tablet by mouth daily. , Disp: , Rfl:  .  Coenzyme Q10 (CO Q 10 PO), Take 1 capsule by mouth daily. , Disp: , Rfl:  .  cyclobenzaprine (FLEXERIL) 10 MG tablet, Take 1 tablet (10 mg total) by mouth 3 (three) times daily as needed for muscle spasms., Disp: 30 tablet, Rfl: 3 .  ferrous sulfate 325 (65 FE) MG tablet, Take 1 tablet (325 mg total) by mouth daily with breakfast., Disp: 90 tablet, Rfl: 3 .  Glucosamine-Chondroit-Vit C-Mn (GLUCOSAMINE CHONDR 1500 COMPLX) CAPS, Take 1 capsule by mouth daily. , Disp: ,  Rfl:  .  Icosapent Ethyl (VASCEPA) 1 g CAPS, Take 2 capsules (2 g total) by mouth 2 (two) times daily., Disp: 360 capsule, Rfl: 3 .  Ipratropium-Albuterol (COMBIVENT) 20-100 MCG/ACT AERS respimat, Inhale 1 puff into the lungs as needed for wheezing or shortness of breath., Disp: 3 Inhaler, Rfl: 3 .  losartan (COZAAR) 50 MG tablet, Take 1 tablet (50 mg total) by mouth daily. (Patient taking differently: Take 25 mg by mouth daily. ), Disp: 90 tablet, Rfl: 3 .  montelukast (SINGULAIR) 10 MG tablet, Take 1 tablet (10 mg total) by mouth at bedtime., Disp: 90 tablet, Rfl: 3 .  Multiple Vitamins-Minerals (CENTRUM SILVER ULTRA MENS) TABS, Take 1 tablet by mouth every morning. Once a day, Disp: , Rfl:  .  Probiotic Product (PHILLIPS COLON HEALTH PO), Take 1 capsule by mouth daily. , Disp: , Rfl:  .  pyridOXINE (VITAMIN B-6) 100 MG tablet, Take 100 mg by mouth daily. , Disp: , Rfl:  .  rosuvastatin (CRESTOR) 10 MG tablet, TAKE 1 TABLET (10 MG TOTAL) BY MOUTH DAILY. (Patient taking differently: Take 10 mg by mouth daily. ), Disp: 90 tablet, Rfl: 3 .  sildenafil (REVATIO) 20 MG tablet, Take 2-5 tabs prior to sexual activity (Patient taking differently: Take 20 mg by mouth 3 (three) times daily as  needed (For sexual activity). ), Disp: 50 tablet, Rfl: 1 .  Turmeric 500 MG CAPS, Take 1 tablet by mouth daily. , Disp: , Rfl:  .  vitamin B-12 (CYANOCOBALAMIN) 500 MCG tablet, Take 500 mcg by mouth daily., Disp: , Rfl:   Allergies:  Allergies  Allergen Reactions  . Dexlansoprazole Nausea And Vomiting  . Celecoxib Other (See Comments)    Solid white - generic pill only = caused diarrhea (other generics ok)  Solid white - generic pill only = caused diarrhea (other generics ok)     Past Medical History, Surgical history, Social history, and Family History were reviewed and updated.  Review of Systems: Review of Systems  Constitutional: Negative.   HENT: Negative.   Eyes: Negative.   Respiratory: Negative.     Cardiovascular: Negative.   Gastrointestinal: Negative.   Genitourinary: Negative.   Musculoskeletal: Negative.   Skin: Negative.   Neurological: Negative.   Endo/Heme/Allergies: Negative.   Psychiatric/Behavioral: Negative.  Negative for depression.     Physical Exam:  weight is 232 lb (105.2 kg). His oral temperature is 96.9 F (36.1 C) (abnormal). His blood pressure is 153/75 (abnormal) and his pulse is 82.  Saturday and Sunday in [place] and on he was in [place] over the next couple weeks [name].  [place]  Physical Exam Vitals reviewed.  HENT:     Head: Normocephalic and atraumatic.  Eyes:     Pupils: Pupils are equal, round, and reactive to light.  Cardiovascular:     Rate and Rhythm: Normal rate and regular rhythm.     Heart sounds: Normal heart sounds.  Pulmonary:     Effort: Pulmonary effort is normal.     Breath sounds: Normal breath sounds.  Abdominal:     General: Bowel sounds are normal.     Palpations: Abdomen is soft.  Musculoskeletal:        General: No tenderness or deformity. Normal range of motion.     Cervical back: Normal range of motion.  Lymphadenopathy:     Cervical: No cervical adenopathy.  Skin:    General: Skin is warm and dry.     Findings: No erythema or rash.  Neurological:     Mental Status: He is alert and oriented to person, place, and time.  Psychiatric:        Behavior: Behavior normal.        Thought Content: Thought content normal.        Judgment: Judgment normal.     Lab Results  Component Value Date   WBC 4.8 04/10/2019   HGB 14.7 04/10/2019   HCT 44.2 04/10/2019   MCV 90.2 04/10/2019   PLT 138 (L) 04/10/2019     Chemistry      Component Value Date/Time   NA 141 04/10/2019 0747   NA 145 (H) 02/05/2019 0930   NA 139 04/13/2017 1150   NA 141 07/09/2016 1038   K 3.7 04/10/2019 0747   K 4.0 04/13/2017 1150   K 4.0 07/09/2016 1038   CL 108 04/10/2019 0747   CL 104 04/13/2017 1150   CO2  26 04/10/2019 0747   CO2 26 04/13/2017 1150   CO2 22 07/09/2016 1038   BUN 21 04/10/2019 0747   BUN 18 02/05/2019 0930   BUN 17 04/13/2017 1150   BUN 19.0 07/09/2016 1038   CREATININE 0.95 04/10/2019 0747   CREATININE 1.0 04/13/2017 1150   CREATININE 0.9 07/09/2016 1038  Component Value Date/Time   CALCIUM 9.2 04/10/2019 0747   CALCIUM 9.4 04/13/2017 1150   CALCIUM 9.5 07/09/2016 1038   ALKPHOS 53 04/10/2019 0747   ALKPHOS 64 04/13/2017 1150   ALKPHOS 85 07/09/2016 1038   AST 17 04/10/2019 0747   AST 22 07/09/2016 1038   ALT 13 04/10/2019 0747   ALT 26 04/13/2017 1150   ALT 22 07/09/2016 1038   BILITOT 0.6 04/10/2019 0747   BILITOT 0.62 07/09/2016 1038         Impression and Plan: Connor [name] is a 73 year old gentleman with polycythemia.  Again, I think this cerebrovascular issue really dictate how we have to be more aggressive now.  It is apparent that his donations to the [facility] really are helping him out.  He does not need to be phlebotomized here.  We will not get him back in 3 months.  Volanda Napoleon, MD 12/29/20208:40 AM

## 2019-07-06 ENCOUNTER — Telehealth: Payer: Self-pay | Admitting: Family Medicine

## 2019-07-06 NOTE — Chronic Care Management (AMB) (Signed)
  Chronic Care Management   Note  07/06/2019 Name: Connor [name] MRN: [id number] DOB: 20-Mar-1946  Connor [name] is a 74 y.o. year old male who is a primary care patient of Dettinger, Fransisca Kaufmann, MD. I reached out to Modesta Messing by phone today in response to a referral sent by Connor [name] [facility] health plan.     Connor [name] was given information about Chronic Care Management services today including:  1. CCM service includes personalized support from designated clinical staff supervised by his physician, including individualized plan of care and coordination with other care providers 2. 24/7 contact phone numbers for assistance for urgent and routine care needs. 3. Service will only be billed when office clinical staff spend 20 minutes or more in a month to coordinate care. 4. Only one practitioner may furnish and bill the service in a calendar month. 5. The patient may stop CCM services at any time (effective at the end of the month) by phone call to the office staff. 6. The patient will be responsible for cost sharing (co-pay) of up to 20% of the service fee (after annual deductible is met).  Patient agreed to services and verbal consent obtained.   Follow up plan: Telephone appointment with care management team member scheduled for:02/11/2020  Noreene Larsson, [place], [place], [place] [address] Direct Dial: [phone] [email or web address]: [email or web address]

## 2019-07-11 ENCOUNTER — Telehealth: Payer: Self-pay | Admitting: Hematology & Oncology

## 2019-07-11 ENCOUNTER — Inpatient Hospital Stay: Payer: Medicare Other

## 2019-07-11 ENCOUNTER — Other Ambulatory Visit: Payer: Self-pay

## 2019-07-11 ENCOUNTER — Encounter: Payer: Self-pay | Admitting: Hematology & Oncology

## 2019-07-11 ENCOUNTER — Inpatient Hospital Stay: Payer: Medicare Other | Attending: Hematology & Oncology | Admitting: Hematology & Oncology

## 2019-07-11 VITALS — BP 156/83 | HR 81 | Temp 97.5°F | Resp 20 | Wt 234.1 lb

## 2019-07-11 VITALS — BP 139/65 | HR 79 | Temp 97.5°F | Resp 17

## 2019-07-11 DIAGNOSIS — D45 Polycythemia vera: Secondary | ICD-10-CM

## 2019-07-11 LAB — CBC WITH DIFFERENTIAL (CANCER CENTER ONLY)
Abs Immature Granulocytes: 0.07 10*3/uL (ref 0.00–0.07)
Basophils Absolute: 0 10*3/uL (ref 0.0–0.1)
Basophils Relative: 1 %
Eosinophils Absolute: 0.2 10*3/uL (ref 0.0–0.5)
Eosinophils Relative: 4 %
HCT: 49.2 % (ref 39.0–52.0)
Hemoglobin: 16.1 g/dL (ref 13.0–17.0)
Immature Granulocytes: 1 %
Lymphocytes Relative: 19 %
Lymphs Abs: 1 10*3/uL (ref 0.7–4.0)
MCH: 29.3 pg (ref 26.0–34.0)
MCHC: 32.7 g/dL (ref 30.0–36.0)
MCV: 89.5 fL (ref 80.0–100.0)
Monocytes Absolute: 0.3 10*3/uL (ref 0.1–1.0)
Monocytes Relative: 7 %
Neutro Abs: 3.4 10*3/uL (ref 1.7–7.7)
Neutrophils Relative %: 68 %
Platelet Count: 145 10*3/uL — ABNORMAL LOW (ref 150–400)
RBC: 5.5 MIL/uL (ref 4.22–5.81)
RDW: 12.7 % (ref 11.5–15.5)
WBC Count: 5 10*3/uL (ref 4.0–10.5)
nRBC: 0 % (ref 0.0–0.2)

## 2019-07-11 LAB — CMP (CANCER CENTER ONLY)
ALT: 16 U/L (ref 0–44)
AST: 21 U/L (ref 15–41)
Albumin: 4.5 g/dL (ref 3.5–5.0)
Alkaline Phosphatase: 57 U/L (ref 38–126)
Anion gap: 6 (ref 5–15)
BUN: 19 mg/dL (ref 8–23)
CO2: 28 mmol/L (ref 22–32)
Calcium: 9.5 mg/dL (ref 8.9–10.3)
Chloride: 108 mmol/L (ref 98–111)
Creatinine: 0.96 mg/dL (ref 0.61–1.24)
GFR, Est AFR Am: >60 mL/min (ref >60)
GFR, Estimated: >60 mL/min (ref >60)
Glucose, Bld: 143 mg/dL — ABNORMAL HIGH (ref 70–99)
Potassium: 4.1 mmol/L (ref 3.5–5.1)
Sodium: 142 mmol/L (ref 135–145)
Total Bilirubin: 0.7 mg/dL (ref 0.3–1.2)
Total Protein: 6.4 g/dL — ABNORMAL LOW (ref 6.5–8.1)

## 2019-07-11 LAB — FERRITIN: Ferritin: 6 ng/mL — ABNORMAL LOW (ref 24–336)

## 2019-07-11 NOTE — Progress Notes (Signed)
Hematology and Oncology Follow Up Visit  Connor [name] [id number] Connor [name] 74 y.o. 07/11/2019   Principle Diagnosis:   Polycythemia vera-Triple negative  Chronic immune thrombocytopenia vs medication  Transient iron deficiency secondary to phlebotomies  Current Therapy:    Phlebotomy to maintain hematocrit below 45%  Aspirin 81 mg by mouth twice daily   IV iron as indicated - last dose given on 06/02/2016     Interim History:  [name] is back for followup.  Everything is going quite well for him.  He feels okay.  He has had no problems over the winter.  We last saw him I think before the holidays.  He has been donating to the [employer].  He enjoys doing this.  His next donation will be April 25.  He has had no headache.  He has had no chest wall pain.  There is no cough.  He has had no change in bowel or bladder habits.  He has had no rashes.  There is been no leg swelling.  He is looking forward to a very nice Easter weekend with his family.    Overall, his performance status is ECOG 0.   Medications:  Current Outpatient Medications:  .  alfuzosin (UROXATRAL) 10 MG 24 hr tablet, Take 10 mg by mouth every morning. , Disp: , Rfl:  .  Ascorbic Acid (VITAMIN C) 100 MG tablet, Take 100 mg by mouth daily., Disp: , Rfl:  .  aspirin 81 MG tablet, Take 81 mg by mouth 2 (two) times daily. , Disp: , Rfl:  .  Black Elderberry 50 MG/5ML SYRP, Take by mouth daily. 2 tablespoons., Disp: , Rfl:  .  cetirizine (ZYRTEC) 10 MG tablet, Take 10 mg by mouth daily., Disp: , Rfl:  .  Cholecalciferol (VITAMIN D-3) 5000 UNITS TABS, Take 1 tablet by mouth daily. , Disp: , Rfl:  .  Coenzyme Q10 (CO Q 10 PO), Take 1 capsule by mouth daily. , Disp: , Rfl:  .  cyclobenzaprine (FLEXERIL) 10 MG tablet, Take 1 tablet (10 mg total) by mouth 3 (three) times daily as needed for muscle spasms., Disp: 30 tablet, Rfl: 3 .  Esomeprazole Magnesium (NEXIUM 24HR) 20 MG TBEC, Nexium 24HR 20 mg tablet,delayed  release, Disp: , Rfl:  .  ferrous sulfate 325 (65 FE) MG tablet, Take 1 tablet (325 mg total) by mouth daily with breakfast., Disp: 90 tablet, Rfl: 3 .  Glucosamine-Chondroit-Vit C-Mn (GLUCOSAMINE CHONDR 1500 COMPLX) CAPS, Take 1 capsule by mouth daily. , Disp: , Rfl:  .  Icosapent Ethyl (VASCEPA) 1 g CAPS, Take 2 capsules (2 g total) by mouth 2 (two) times daily., Disp: 360 capsule, Rfl: 3 .  Ipratropium-Albuterol (COMBIVENT) 20-100 MCG/ACT AERS respimat, Inhale 1 puff into the lungs as needed for wheezing or shortness of breath., Disp: 3 Inhaler, Rfl: 3 .  losartan (COZAAR) 50 MG tablet, Take 1 tablet (50 mg total) by mouth daily. (Patient taking differently: Take 25 mg by mouth daily. ), Disp: 90 tablet, Rfl: 3 .  montelukast (SINGULAIR) 10 MG tablet, Take 1 tablet (10 mg total) by mouth at bedtime., Disp: 90 tablet, Rfl: 3 .  Multiple Vitamins-Minerals (CENTRUM SILVER ULTRA MENS) TABS, Take 1 tablet by mouth every morning. Once a day, Disp: , Rfl:  .  oxyCODONE (OXY IR/ROXICODONE) 5 MG immediate release tablet, , Disp: , Rfl:  .  Probiotic Product (PHILLIPS COLON HEALTH PO), Take 1 capsule by mouth daily. , Disp: , Rfl:  .  pyridOXINE (VITAMIN B-6) 100 MG tablet, Take 100 mg by mouth daily. , Disp: , Rfl:  .  rosuvastatin (CRESTOR) 10 MG tablet, TAKE 1 TABLET (10 MG TOTAL) BY MOUTH DAILY. (Patient taking differently: Take 10 mg by mouth daily. ), Disp: 90 tablet, Rfl: 3 .  sildenafil (REVATIO) 20 MG tablet, Take 2-5 tabs prior to sexual activity (Patient taking differently: Take 20 mg by mouth 3 (three) times daily as needed (For sexual activity). ), Disp: 50 tablet, Rfl: 1 .  Turmeric 500 MG CAPS, Take 1 tablet by mouth daily. , Disp: , Rfl:  .  vitamin B-12 (CYANOCOBALAMIN) 500 MCG tablet, Take 500 mcg by mouth daily., Disp: , Rfl:  .  Zinc 50 MG TABS, Take by mouth daily., Disp: , Rfl:   Allergies:  Allergies  Allergen Reactions  . Dexlansoprazole Nausea And Vomiting  . Celecoxib Other  (See Comments)    Solid white - generic pill only = caused diarrhea (other generics ok)  Solid white - generic pill only = caused diarrhea (other generics ok)     Past Medical History, Surgical history, Social history, and Family History were reviewed and updated.  Review of Systems: Review of Systems  Constitutional: Negative.   HENT: Negative.   Eyes: Negative.   Respiratory: Negative.   Cardiovascular: Negative.   Gastrointestinal: Negative.   Genitourinary: Negative.   Musculoskeletal: Negative.   Skin: Negative.   Neurological: Negative.   Endo/Heme/Allergies: Negative.   Psychiatric/Behavioral: Negative.  Negative for depression.     Physical Exam:  weight is 234 lb 1.9 oz (106.2 kg). His temporal temperature is 97.5 F (36.4 C) (abnormal). His blood pressure is 156/83 (abnormal) and his pulse is 81. His respiration is 20 and oxygen saturation is 98%.  Saturday and Sunday in [place] and on he was in [place] over the next couple weeks I Connor [name].  [place]  Physical Exam Vitals reviewed.  HENT:     Head: Normocephalic and atraumatic.  Eyes:     Pupils: Pupils are equal, round, and reactive to light.  Cardiovascular:     Rate and Rhythm: Normal rate and regular rhythm.     Heart sounds: Normal heart sounds.  Pulmonary:     Effort: Pulmonary effort is normal.     Breath sounds: Normal breath sounds.  Abdominal:     General: Bowel sounds are normal.     Palpations: Abdomen is soft.  Musculoskeletal:        General: No tenderness or deformity. Normal range of motion.     Cervical back: Normal range of motion.  Lymphadenopathy:     Cervical: No cervical adenopathy.  Skin:    General: Skin is warm and dry.     Findings: No erythema or rash.  Neurological:     Mental Status: He is alert and oriented to person, place, and time.  Psychiatric:        Behavior: Behavior normal.        Thought Content: Thought content normal.        Judgment:  Judgment normal.     Lab Results  Component Value Date   WBC 5.0 07/11/2019   HGB 16.1 07/11/2019   HCT 49.2 07/11/2019   MCV 89.5 07/11/2019   PLT 145 (L) 07/11/2019     Chemistry      Component Value Date/Time   NA 142 07/11/2019 0903   NA 145 (H) 02/05/2019 0930   NA 139 04/13/2017 1150  NA 141 07/09/2016 1038   K 4.1 07/11/2019 0903   K 4.0 04/13/2017 1150   K 4.0 07/09/2016 1038   CL 108 07/11/2019 0903   CL 104 04/13/2017 1150   CO2 28 07/11/2019 0903   CO2 26 04/13/2017 1150   CO2 22 07/09/2016 1038   BUN 19 07/11/2019 0903   BUN 18 02/05/2019 0930   BUN 17 04/13/2017 1150   BUN 19.0 07/09/2016 1038   CREATININE 0.96 07/11/2019 0903   CREATININE 1.0 04/13/2017 1150   CREATININE 0.9 07/09/2016 1038      Component Value Date/Time   CALCIUM 9.5 07/11/2019 0903   CALCIUM 9.4 04/13/2017 1150   CALCIUM 9.5 07/09/2016 1038   ALKPHOS 57 07/11/2019 0903   ALKPHOS 64 04/13/2017 1150   ALKPHOS 85 07/09/2016 1038   AST 21 07/11/2019 0903   AST 22 07/09/2016 1038   ALT 16 07/11/2019 0903   ALT 26 04/13/2017 1150   ALT 22 07/09/2016 1038   BILITOT 0.7 07/11/2019 0903   BILITOT 0.62 07/09/2016 1038         Impression and Plan: Connor [name] is a 74 year old gentleman with polycythemia. Since his next [employer] donation would not be for about a month, we will have to phlebotomize him today.  I just do not want to see his hematocrit this high.  I think this is also affecting his blood pressure.  Otherwise, we will follow him up in 3 more months.    Volanda Napoleon, MD 3/31/20219:55 AM

## 2019-07-11 NOTE — Telephone Encounter (Signed)
Appointments scheduled calendar printed per 3/31 los °

## 2019-07-11 NOTE — Patient Instructions (Signed)
Therapeutic Phlebotomy Therapeutic phlebotomy is the planned removal of blood from a person's body for the purpose of treating a medical condition. The procedure is similar to donating blood. Usually, about a pint (470 mL, or 0.47 L) of blood is removed. The average adult has 9-12 pints (4.3-5.7 L) of blood in the body. Therapeutic phlebotomy may be used to treat the following medical conditions:  Hemochromatosis. This is a condition in which the blood contains too much iron.  Polycythemia vera. This is a condition in which the blood contains too many red blood cells.  Porphyria cutanea tarda. This is a disease in which an important part of hemoglobin is not made properly. It results in the buildup of abnormal amounts of porphyrins in the body.  Sickle cell disease. This is a condition in which the red blood cells form an abnormal crescent shape rather than a round shape. Tell a health care provider about:  Any allergies you have.  All medicines you are taking, including vitamins, herbs, eye drops, creams, and over-the-counter medicines.  Any problems you or family members have had with anesthetic medicines.  Any blood disorders you have.  Any surgeries you have had.  Any medical conditions you have.  Whether you are pregnant or may be pregnant. What are the risks? Generally, this is a safe procedure. However, problems may occur, including:  Nausea or light-headedness.  Low blood pressure (hypotension).  Soreness, bleeding, swelling, or bruising at the needle insertion site.  Infection. What happens before the procedure?  Follow instructions from your health care provider about eating or drinking restrictions.  Ask your health care provider about: ? Changing or stopping your regular medicines. This is especially important if you are taking diabetes medicines or blood thinners (anticoagulants). ? Taking medicines such as aspirin and ibuprofen. These medicines can thin your  blood. Do not take these medicines unless your health care provider tells you to take them. ? Taking over-the-counter medicines, vitamins, herbs, and supplements.  Wear clothing with sleeves that can be raised above the elbow.  Plan to have someone take you home from the hospital or clinic.  You may have a blood sample taken.  Your blood pressure, pulse rate, and breathing rate will be measured. What happens during the procedure?   To lower your risk of infection: ? Your health care team will wash or sanitize their hands. ? Your skin will be cleaned with an antiseptic.  You may be given a medicine to numb the area (local anesthetic).  A tourniquet will be placed on your arm.  A needle will be inserted into one of your veins.  Tubing and a collection bag will be attached to that needle.  Blood will flow through the needle and tubing into the collection bag.  The collection bag will be placed lower than your arm to allow gravity to help the flow of blood into the bag.  You may be asked to open and close your hand slowly and continually during the entire collection.  After the specified amount of blood has been removed from your body, the collection bag and tubing will be clamped.  The needle will be removed from your vein.  Pressure will be held on the site of the needle insertion to stop the bleeding.  A bandage (dressing) will be placed over the needle insertion site. The procedure may vary among health care providers and hospitals. What happens after the procedure?  Your blood pressure, pulse rate, and breathing rate will be   measured after the procedure.  You will be encouraged to drink fluids.  Your recovery will be assessed and monitored.  You can return to your normal activities as told by your health care provider. Summary  Therapeutic phlebotomy is the planned removal of blood from a person's body for the purpose of treating a medical condition.  Therapeutic  phlebotomy may be used to treat hemochromatosis, polycythemia vera, porphyria cutanea tarda, or sickle cell disease.  In the procedure, a needle is inserted and about a pint (470 mL, or 0.47 L) of blood is removed. The average adult has 9-12 pints (4.3-5.7 L) of blood in the body.  This is generally a safe procedure, but it can sometimes cause problems such as nausea, light-headedness, or low blood pressure (hypotension). This information is not intended to replace advice given to you by your health care provider. Make sure you discuss any questions you have with your health care provider. Document Revised: 04/14/2017 Document Reviewed: 04/14/2017 Elsevier Patient Education  2020 [employer]  

## 2019-07-30 ENCOUNTER — Telehealth: Payer: Self-pay | Admitting: Hematology & Oncology

## 2019-07-30 NOTE — Telephone Encounter (Signed)
Called and LMVM for patient regarding appointment scheduled per 4/19 staff message

## 2019-08-01 ENCOUNTER — Inpatient Hospital Stay: Payer: Medicare Other | Attending: Hematology & Oncology

## 2019-08-01 ENCOUNTER — Other Ambulatory Visit: Payer: Self-pay | Admitting: Family

## 2019-08-01 ENCOUNTER — Other Ambulatory Visit: Payer: Self-pay

## 2019-08-01 VITALS — BP 135/60 | HR 77 | Temp 97.6°F | Resp 17

## 2019-08-01 DIAGNOSIS — D45 Polycythemia vera: Secondary | ICD-10-CM | POA: Insufficient documentation

## 2019-08-01 DIAGNOSIS — R3912 Poor urinary stream: Secondary | ICD-10-CM | POA: Diagnosis not present

## 2019-08-01 DIAGNOSIS — R350 Frequency of micturition: Secondary | ICD-10-CM | POA: Diagnosis not present

## 2019-08-01 DIAGNOSIS — D508 Other iron deficiency anemias: Secondary | ICD-10-CM

## 2019-08-01 DIAGNOSIS — K909 Intestinal malabsorption, unspecified: Secondary | ICD-10-CM

## 2019-08-01 DIAGNOSIS — R351 Nocturia: Secondary | ICD-10-CM | POA: Diagnosis not present

## 2019-08-01 MED ORDER — SODIUM CHLORIDE 0.9 % IV SOLN
200.0000 mg | Freq: Once | INTRAVENOUS | Status: AC
  Administered 2019-08-01: 200 mg via INTRAVENOUS
  Filled 2019-08-01: qty 10

## 2019-08-01 MED ORDER — SODIUM CHLORIDE 0.9 % IV SOLN
Freq: Once | INTRAVENOUS | Status: AC
  Filled 2019-08-01: qty 250

## 2019-08-01 NOTE — Patient Instructions (Signed)

## 2019-08-06 ENCOUNTER — Telehealth: Payer: Self-pay | Admitting: Family Medicine

## 2019-08-06 ENCOUNTER — Other Ambulatory Visit: Payer: Self-pay | Admitting: *Deleted

## 2019-08-06 DIAGNOSIS — E785 Hyperlipidemia, unspecified: Secondary | ICD-10-CM

## 2019-08-06 DIAGNOSIS — E559 Vitamin D deficiency, unspecified: Secondary | ICD-10-CM

## 2019-08-06 DIAGNOSIS — E7800 Pure hypercholesterolemia, unspecified: Secondary | ICD-10-CM

## 2019-08-06 DIAGNOSIS — I10 Essential (primary) hypertension: Secondary | ICD-10-CM

## 2019-08-06 DIAGNOSIS — E349 Endocrine disorder, unspecified: Secondary | ICD-10-CM

## 2019-08-06 DIAGNOSIS — R7303 Prediabetes: Secondary | ICD-10-CM

## 2019-08-06 NOTE — Telephone Encounter (Signed)
Labs ordered. Wife aware

## 2019-08-07 ENCOUNTER — Other Ambulatory Visit: Payer: Medicare Other

## 2019-08-07 ENCOUNTER — Other Ambulatory Visit: Payer: Self-pay

## 2019-08-07 DIAGNOSIS — R7303 Prediabetes: Secondary | ICD-10-CM | POA: Diagnosis not present

## 2019-08-07 DIAGNOSIS — E349 Endocrine disorder, unspecified: Secondary | ICD-10-CM

## 2019-08-07 DIAGNOSIS — D508 Other iron deficiency anemias: Secondary | ICD-10-CM | POA: Diagnosis not present

## 2019-08-07 DIAGNOSIS — K909 Intestinal malabsorption, unspecified: Secondary | ICD-10-CM | POA: Diagnosis not present

## 2019-08-07 DIAGNOSIS — E7800 Pure hypercholesterolemia, unspecified: Secondary | ICD-10-CM | POA: Diagnosis not present

## 2019-08-07 DIAGNOSIS — E559 Vitamin D deficiency, unspecified: Secondary | ICD-10-CM | POA: Diagnosis not present

## 2019-08-07 DIAGNOSIS — E785 Hyperlipidemia, unspecified: Secondary | ICD-10-CM

## 2019-08-07 DIAGNOSIS — I10 Essential (primary) hypertension: Secondary | ICD-10-CM | POA: Diagnosis not present

## 2019-08-07 LAB — BAYER DCA HB A1C WAIVED: HB A1C (BAYER DCA - WAIVED): 6 % (ref <7.0)

## 2019-08-08 ENCOUNTER — Other Ambulatory Visit: Payer: Self-pay | Admitting: Family Medicine

## 2019-08-08 DIAGNOSIS — K909 Intestinal malabsorption, unspecified: Secondary | ICD-10-CM

## 2019-08-08 DIAGNOSIS — D45 Polycythemia vera: Secondary | ICD-10-CM

## 2019-08-08 LAB — LIPID PANEL
Chol/HDL Ratio: 3.2 ratio (ref 0.0–5.0)
Cholesterol, Total: 102 mg/dL (ref 100–199)
HDL: 32 mg/dL — ABNORMAL LOW (ref >39)
LDL Chol Calc (NIH): 53 mg/dL (ref 0–99)
Triglycerides: 83 mg/dL (ref 0–149)
VLDL Cholesterol Cal: 17 mg/dL (ref 5–40)

## 2019-08-08 LAB — TESTOSTERONE,FREE AND TOTAL
Testosterone, Free: 5.5 pg/mL — ABNORMAL LOW (ref 6.6–18.1)
Testosterone: 290 ng/dL (ref 264–916)

## 2019-08-08 LAB — VITAMIN D 25 HYDROXY (VIT D DEFICIENCY, FRACTURES): Vit D, 25-Hydroxy: 68.2 ng/mL (ref 30.0–100.0)

## 2019-08-08 NOTE — Progress Notes (Signed)
Placed orders for patient blood draw

## 2019-08-09 LAB — FERRITIN: Ferritin: 115 ng/mL (ref 30–400)

## 2019-08-09 LAB — IRON AND TIBC
Iron Saturation: 45 % (ref 15–55)
Iron: 143 ug/dL (ref 38–169)
Total Iron Binding Capacity: 318 ug/dL (ref 250–450)
UIBC: 175 ug/dL (ref 111–343)

## 2019-08-09 LAB — SPECIMEN STATUS REPORT

## 2019-08-10 ENCOUNTER — Ambulatory Visit (INDEPENDENT_AMBULATORY_CARE_PROVIDER_SITE_OTHER): Payer: Medicare Other | Admitting: *Deleted

## 2019-08-10 VITALS — Ht 71.5 in | Wt 234.1 lb

## 2019-08-10 DIAGNOSIS — Z0000 Encounter for general adult medical examination without abnormal findings: Secondary | ICD-10-CM | POA: Diagnosis not present

## 2019-08-10 NOTE — Progress Notes (Signed)
MEDICARE ANNUAL WELLNESS VISIT  08/10/2019  Telephone Visit Disclaimer This Medicare AWV was conducted by telephone due to national recommendations for restrictions regarding the COVID-19 Pandemic (e.g. social distancing).  I verified, using two identifiers, that I am speaking with Connor [name] or their authorized healthcare agent. I discussed the limitations, risks, security, and privacy concerns of performing an evaluation and management service by telephone and the potential availability of an in-person appointment in the future. The patient expressed understanding and agreed to proceed.   Subjective:  Connor [name] is a 74 y.o. male patient of Dettinger, Fransisca Kaufmann, MD who had a Medicare Annual Wellness Visit today via telephone. [name] is Retired and lives with their spouse. he has 3 living children and 1 deceased. he reports that he is socially active and does interact with friends/family regularly. he is minimally physically active and enjoys golfing.  Patient Care Team: Dettinger, Fransisca Kaufmann, MD as PCP - General (Family Medicine) Troy Sine, MD as PCP - Cardiology (Cardiology) Troy Sine, MD as Consulting Physician (Cardiology) Roseanne Kaufman, MD as Consulting Physician (Orthopedic Surgery) Gatha Mayer, MD as Consulting Physician (Gastroenterology) Wylene Simmer, MD as Consulting Physician (Orthopedic Surgery) Melissa Montane, MD as Consulting Physician (Otolaryngology) Chesley Mires, MD as Consulting Physician (Pulmonary Disease) Susa Day, MD as Consulting Physician (Orthopedic Surgery) Sydnee Cabal, MD as Consulting Physician (Orthopedic Surgery) Volanda Napoleon, MD as Consulting Physician (Oncology) Ilean China, RN as Registered Nurse  Advanced Directives 08/10/2019 07/11/2019 04/10/2019 04/10/2019 02/23/2019 01/30/2019 12/08/2018  Does Patient Have a Medical Advance Directive? No No No No Yes No No  Does patient want to make changes to medical  advance directive? - - No - Patient declined No - Patient declined No - Patient declined - -  Copy of [place] in Chart? - - No - copy requested No - copy requested - - -  Would patient like information on creating a medical advance directive? Yes (MAU/Ambulatory/Procedural Areas - Information given) No - Patient declined No - Patient declined No - Patient declined - No - Patient declined No - Patient declined  Pre-existing out of facility DNR order (yellow form or pink MOST form) - - - - - - -    Hospital Utilization Over the Past 12 Months: # of hospitalizations or ER visits: 1 # of surgeries: 0  Review of Systems    Patient reports that his overall health is worse compared to last year.  History obtained from chart review and the patient General ROS: negative Musculoskeletal ROS: positive for - pain in hip - right  Patient Reported Readings (BP, Pulse, CBG, Weight, etc) none  Pain Assessment Pain : 0-10 Pain Score: 5  Pain Type: Acute pain Pain Location: Hip Pain Orientation: Right Pain Descriptors / Indicators: Stabbing, Sharp Pain Onset: 1 to 4 weeks ago Pain Frequency: Intermittent Pain Relieving Factors: Slight relief with Tylenol Effect of Pain on Daily Activities: Effects walking when severe  Pain Relieving Factors: Slight relief with Tylenol  Current Medications & Allergies (verified) Allergies as of 08/10/2019      Reactions   Dexlansoprazole Nausea And Vomiting   Celecoxib Other (See Comments)   Solid white - generic pill only = caused diarrhea (other generics ok)  Solid white - generic pill only = caused diarrhea (other generics ok)       Medication List       Accurate as of August 10, 2019  9:20 AM. If  you have any questions, ask your nurse or doctor.        STOP taking these medications   cyclobenzaprine 10 MG tablet Commonly known as: FLEXERIL   oxyCODONE 5 MG immediate release tablet Commonly known as: Oxy IR/ROXICODONE      TAKE these medications   alfuzosin 10 MG 24 hr tablet Commonly known as: UROXATRAL Take 10 mg by mouth every morning.   aspirin 81 MG tablet Take 81 mg by mouth 2 (two) times daily.   Black Elderberry 50 MG/5ML Syrp Take by mouth daily. 2 tablespoons.   Centrum Silver Ultra Mens Tabs Take 1 tablet by mouth every morning. Once a day   cetirizine 10 MG tablet Commonly known as: ZYRTEC Take 10 mg by mouth daily.   CO Q 10 PO Take 1 capsule by mouth daily.   ferrous sulfate 325 (65 FE) MG tablet Take 1 tablet (325 mg total) by mouth daily with breakfast.   Glucosamine Chondr 1500 Complx Caps Take 1 capsule by mouth daily.   icosapent Ethyl 1 g capsule Commonly known as: Vascepa Take 2 capsules (2 g total) by mouth 2 (two) times daily.   Ipratropium-Albuterol 20-100 MCG/ACT Aers respimat Commonly known as: COMBIVENT Inhale 1 puff into the lungs as needed for wheezing or shortness of breath.   losartan 50 MG tablet Commonly known as: COZAAR Take 1 tablet (50 mg total) by mouth daily. What changed: how much to take   montelukast 10 MG tablet Commonly known as: SINGULAIR Take 1 tablet (10 mg total) by mouth at bedtime.   NexIUM 24HR 20 MG Tbec Generic drug: Esomeprazole Magnesium Nexium 24HR 20 mg tablet,delayed release   PHILLIPS COLON HEALTH PO Take 1 capsule by mouth daily.   pyridOXINE 100 MG tablet Commonly known as: VITAMIN B-6 Take 100 mg by mouth daily.   rosuvastatin 10 MG tablet Commonly known as: CRESTOR TAKE 1 TABLET (10 MG TOTAL) BY MOUTH DAILY. What changed:   how much to take  how to take this  when to take this  additional instructions   sildenafil 20 MG tablet Commonly known as: REVATIO Take 2-5 tabs prior to sexual activity What changed:   how much to take  how to take this  when to take this  reasons to take this  additional instructions   Turmeric 500 MG Caps Take 1 tablet by mouth daily.   vitamin B-12 500 MCG  tablet Commonly known as: CYANOCOBALAMIN Take 500 mcg by mouth daily.   vitamin C 100 MG tablet Take 100 mg by mouth daily.   Vitamin D-3 125 MCG (5000 UT) Tabs Take 1 tablet by mouth daily.   Zinc 50 MG Tabs Take by mouth daily.       History (reviewed): Past Medical History:  Diagnosis Date  . Anemia   . BPH (benign prostatic hyperplasia)   . Cancer (HCC)    squamous cell on nose , inside nose   . Cardiomegaly   . Carotid artery stenosis   . COPD, mild ([place]) 05/26/2007   PFT 09/09/11>>FEV1 3.06 (95%), FEV1% 69, FEF 25-75% 1.70 (59%), TLC 7.67 (109%), DLCO 108%, no BD    . Cough 05/26/2007  . Erectile dysfunction   . GERD (gastroesophageal reflux disease)   . H/O vitamin D deficiency   . History of nuclear stress test    this revealed normal myocardial perfusion and function. Post stress EF was 57%. There was no region of scar or ischemia.  [name] Hx of  echocardiogram 02/15/2012   This showed mild concentric LVH. EF > 55%. H edid have grade 1 diastolic dysfunction with mitral valve E:A ratio of 0.81, his left atrium was mildly dilated by volume assessment at 33.3 mL/m2. He did have mild tricupid regurgitation with upper normal RV systolic pressure at [id number]. He had aortic valve sclerosis without stenosis.  . Hyperlipemia   . Hypertension   . Iron deficiency anemia, unspecified 09/01/2012  . Iron malabsorption 05/28/2016  . Nephrolithiasis    stones   . Peyronie's disease   . Polycythemia vera(238.4) 04/19/2013  . Shortness of breath    with exertion   . Sleep apnea    Cpap-   . Thrombocytopenia ([place])    Past Surgical History:  Procedure Laterality Date  . APPENDECTOMY    . EYE SURGERY Bilateral    eye lid surgery  . HIATAL HERNIA REPAIR  11/24/2011   Procedure: LAPAROSCOPIC REPAIR OF HIATAL HERNIA;  Surgeon: Pedro Earls, MD;  Location: [facility];  Service: General;;  . KNEE SURGERY     right  . LAPAROSCOPIC NISSEN FUNDOPLICATION  [id number]   Procedure: LAPAROSCOPIC  NISSEN FUNDOPLICATION;  Surgeon: Pedro Earls, MD;  Location: [facility];  Service: General;  Laterality: N/A;  . NOSE SURGERY  2013  . SHOULDER SURGERY     left   Family History  Problem Relation Age of Onset  . Heart disease Mother   . Diabetes Mother   . Heart disease Father   . Heart disease Sister   . Arthritis Sister   . Diabetes Brother   . Hypertension Brother   . Heart attack Brother        enlarged heart  . Liver cancer Paternal Grandfather   . Throat cancer Maternal Grandfather   . Hemachromatosis Daughter   . Arthritis Daughter   . GI problems Daughter   . Thyroid disease Daughter   . Hypertension Daughter   . Heart disease Paternal Grandmother   . Thyroid disease Daughter        pituitary tumor  . Migraines Daughter   . Colon cancer Neg Hx   . Esophageal cancer Neg Hx   . Rectal cancer Neg Hx   . Stomach cancer Neg Hx    Social History   Socioeconomic History  . Marital status: Married    Spouse name: Hoyle Sauer   . Number of children: 3  . Years of education: 3  . Highest education level: 12th grade  Occupational History  . Occupation: retired    Fish farm manager: RETIRED    Comment: [employer]  Tobacco Use  . Smoking status: Former Smoker    Packs/day: 0.25    Years: 11.00    Pack years: 2.75    Types: Cigarettes    Start date: 05/17/1958    Quit date: 04/12/1966    Years since quitting: 53.3  . Smokeless tobacco: Never Used  . Tobacco comment: quit smoking in 1968  Substance and Sexual Activity  . Alcohol use: Yes    Alcohol/week: 0.0 standard drinks    Comment: seldom  . Drug use: No  . Sexual activity: Not Currently  Other Topics Concern  . Not on file  Social History Narrative   Right handed    Live with wife   Social Determinants of Health   Financial Resource Strain: Low Risk   . Difficulty of Paying Living Expenses: Not hard at all  Food Insecurity: No Food Insecurity  . Worried About Estate manager/land agent  of Food in the Last Year: Never  true  . Ran Out of Food in the Last Year: Never true  Transportation Needs: No Transportation Needs  . Lack of Transportation (Medical): No  . Lack of Transportation (Non-Medical): No  Physical Activity: Sufficiently Active  . Days of Exercise per Week: 5 days  . Minutes of Exercise per Session: 30 min  Stress: No Stress Concern Present  . Feeling of Stress : Not at all  Social Connections: Not Isolated  . Frequency of Communication with Friends and Family: More than three times a week  . Frequency of Social Gatherings with Friends and Family: More than three times a week  . Attends Religious Services: More than 4 times per year  . Active Member of Clubs or Organizations: Yes  . Attends Archivist Meetings: More than 4 times per year  . Marital Status: Married    Activities of Daily Living In your present state of health, do you have any difficulty performing the following activities: 08/10/2019  Hearing? N  Vision? N  Difficulty concentrating or making decisions? N  Walking or climbing stairs? Y  Comment Right Hip Pain  Dressing or bathing? N  Doing errands, shopping? N  Preparing Food and eating ? N  Using the Toilet? N  In the past six months, have you accidently leaked urine? N  Do you have problems with loss of bowel control? N  Managing your Medications? N  Managing your Finances? N  Housekeeping or managing your Housekeeping? N  Some recent data might be hidden    Patient Education/ Literacy How often do you need to have someone help you when you read instructions, pamphlets, or other written materials from your doctor or pharmacy?: 1 - Never What is the last grade level you completed in school?: 12th  Exercise Current Exercise Habits: Home exercise routine, Type of exercise: strength training/weights;stretching, Time (Minutes): 30, Frequency (Times/Week): 7, Weekly Exercise (Minutes/Week): 210, Intensity: Mild, Exercise limited by: orthopedic  condition(s)  Diet Patient reports consuming 2-3 meals a day and occasionally snack(s) a day Patient reports that his primary diet is: Regular Patient reports that she does have regular access to food.   Depression Screen PHQ 2/9 Scores 08/10/2019 02/12/2019 04/07/2018 03/14/2018 11/08/2017 07/01/2017 03/31/2017  PHQ - 2 Score 0 0 0 0 1 4 0  PHQ- 9 Score - - - - - 10 -     Fall Risk Fall Risk  08/10/2019 02/12/2019 04/07/2018 03/14/2018 11/08/2017  Falls in the past year? 0 0 0 0 No  Injury with Fall? 0 - - - -  Risk for fall due to : No Fall Risks - - - -  Follow up Falls evaluation completed - - - -     Objective:  Connor [name] seemed alert and oriented and he participated appropriately during our telephone visit.  Blood Pressure Weight BMI  BP Readings from Last 3 Encounters:  08/01/19 135/60  07/11/19 139/65  07/11/19 (!) 156/83   Wt Readings from Last 3 Encounters:  08/10/19 234 lb 2.1 oz (106.2 kg)  07/11/19 234 lb 1.9 oz (106.2 kg)  04/10/19 232 lb (105.2 kg)   BMI Readings from Last 1 Encounters:  08/10/19 32.20 kg/m    *Unable to obtain current vital signs, weight, and BMI due to telephone visit type  Hearing/Vision  . Jamarii did not seem to have difficulty with hearing/understanding during the telephone conversation . Reports that he has not had a formal eye  exam by an eye care professional within the past year . Reports that he has not had a formal hearing evaluation within the past year *Unable to fully assess hearing and vision during telephone visit type  Cognitive Function: 6CIT Screen 08/10/2019  What Year? 0 points  What month? 0 points  What time? 0 points  Count back from 20 0 points  Months in reverse 0 points  Repeat phrase 0 points  Total Score 0   (Normal:0-7, Significant for Dysfunction: >8)  Normal Cognitive Function Screening: Yes   Immunization & Health Maintenance Record Immunization History  Administered Date(s) Administered  .  Influenza Whole 01/11/2011  . Influenza, High Dose Seasonal PF 02/25/2016, 02/02/2017, 01/31/2018  . Influenza,inj,Quad PF,6+ Mos 02/07/2013, 01/17/2014, 03/26/2015  . Influenza,inj,quad, With Preservative 01/10/2017  . Influenza-Unspecified 03/26/2015  . Pneumococcal Conjugate-13 09/07/2013  . Pneumococcal Polysaccharide-23 02/10/2009  . Pneumococcal-Unspecified 08/11/2015  . Tdap 08/11/2006  . Zoster 08/11/2006    Health Maintenance  Topic Date Due  . TETANUS/TDAP  08/10/2016  . Hepatitis C Screening  07/03/2023 (Originally Sep 10, 1945)  . INFLUENZA VACCINE  11/11/2019  . COLONOSCOPY  06/02/2022  . PNA vac Low Risk Adult  Completed  . COLON CANCER SCREENING ANNUAL FOBT  Discontinued  . COVID-19 Vaccine  Discontinued       Assessment  This is a routine wellness examination for Connor [name].  Health Maintenance: Due or Overdue Health Maintenance Due  Topic Date Due  . TETANUS/TDAP  08/10/2016    Connor [name] does not need a referral for Community Assistance: Care Management:   no Social Work:    no Prescription Assistance:  no Nutrition/Diabetes Education:  no   Plan:  Personalized Goals Goals Addressed            This Visit's Progress   . Exercise 3x per week (30 min per time)       08/10/2019 AWV Goal: Exercise for General Health   Patient will verbalize understanding of the benefits of increased physical activity:  Exercising regularly is important. It will improve your overall fitness, flexibility, and endurance.  Regular exercise also will improve your overall health. It can help you control your weight, reduce stress, and improve your bone density.  Over the next year, patient will increase physical activity as tolerated with a goal of at least 150 minutes of moderate physical activity per week.   You can tell that you are exercising at a moderate intensity if your heart starts beating faster and you start breathing faster but can still hold a  conversation.  Moderate-intensity exercise ideas include:  Walking 1 mile (1.6 km) in about 15 minutes  Biking  Hiking  Golfing  Dancing  Water aerobics  Patient will verbalize understanding of everyday activities that increase physical activity by providing examples like the following: ? Yard work, such as: ? Pushing a Conservation officer, nature ? Raking and bagging leaves ? Washing your car ? Pushing a stroller ? Shoveling snow ? Gardening ? Washing windows or floors  Patient will be able to explain general safety guidelines for exercising:   Before you start a new exercise program, talk with your health care provider.  Do not exercise so much that you hurt yourself, feel dizzy, or get very short of breath.  Wear comfortable clothes and wear shoes with good support.  Drink plenty of water while you exercise to prevent dehydration or heat stroke.  Work out until your breathing and your heartbeat get faster.     [name]  Have 3 meals a day       08/10/2019 AWV Goal: Improved Nutrition/Diet  . Patient will verbalize understanding that diet plays an important role in overall health and that a poor diet is a risk factor for many chronic medical conditions.  . Over the next year, patient will improve self management of their diet by incorporating better variety, less frequent dining out, more consistent meal timing, increased physical activity, improved protein intake, and better food choices. . Patient will utilize available community resources to help with food acquisition if needed (ex: food pantries, [address], etc) . Patient will work with nutrition specialist if a referral was made       Personalized Health Maintenance & Screening Recommendations  Td vaccine Advanced directives: has NO advanced directive  - add't info requested. Referral to SW: no Shingrix  Lung Cancer Screening Recommended: no (Low Dose CT Chest recommended if Age 36-80 years, 30 pack-year currently smoking OR have  quit w/in past 15 years) Hepatitis C Screening recommended: yes HIV Screening recommended: no  Advanced Directives: Written information was prepared per patient's request.  Referrals & Orders No orders of the defined types were placed in this encounter.   Follow-up Plan . Follow-up with Dettinger, Fransisca Kaufmann, MD as planned . Schedule Eye exam . Will check on the price of Shingrix and Tdap(Boostrix) at office visit on 08/20/2019 . Patient to look over advance directive information given and discuss with family    I have personally reviewed and noted the following in the patient's chart:   . Medical and social history . Use of alcohol, tobacco or illicit drugs  . Current medications and supplements . Functional ability and status . Nutritional status . Physical activity . Advanced directives . List of other physicians . Hospitalizations, surgeries, and ER visits in previous 12 months . Vitals . Screenings to include cognitive, depression, and falls . Referrals and appointments  In addition, I have reviewed and discussed with Connor [name] certain preventive protocols, quality metrics, and best practice recommendations. A written personalized care plan for preventive services as well as general preventive health recommendations is available and can be mailed to the patient at his request.      Wardell Heath, LPN  [id number]  AVS printed and Mailed to patient

## 2019-08-10 NOTE — Patient Instructions (Addendum)
[facility] Maintenance Summary and Written Plan of Care  [name] ,  Thank you for allowing me to perform your Medicare Annual Wellness Visit and for your ongoing commitment to your health.   Health Maintenance & Immunization History Health Maintenance  Topic Date Due   TETANUS/TDAP  08/10/2016   Hepatitis C Screening  07/03/2023 (Originally 1946/02/21)   INFLUENZA VACCINE  11/11/2019   COLONOSCOPY  06/02/2022   PNA vac Low Risk Adult  Completed   COLON CANCER SCREENING ANNUAL FOBT  Discontinued   COVID-19 Vaccine  Discontinued   Immunization History  Administered Date(s) Administered   Influenza Whole 01/11/2011   Influenza, High Dose Seasonal PF 02/25/2016, 02/02/2017, 01/31/2018   Influenza,inj,Quad PF,6+ Mos 02/07/2013, 01/17/2014, 03/26/2015   Influenza,inj,quad, With Preservative 01/10/2017   Influenza-Unspecified 03/26/2015   Pneumococcal Conjugate-13 09/07/2013   Pneumococcal Polysaccharide-23 02/10/2009   Pneumococcal-Unspecified 08/11/2015   Tdap 08/11/2006   Zoster 08/11/2006    These are the patient goals that we discussed: Goals Addressed            This Visit's Progress    Exercise 3x per week (30 min per time)       08/10/2019 AWV Goal: Exercise for General Health   Patient will verbalize understanding of the benefits of increased physical activity:  Exercising regularly is important. It will improve your overall fitness, flexibility, and endurance.  Regular exercise also will improve your overall health. It can help you control your weight, reduce stress, and improve your bone density.  Over the next year, patient will increase physical activity as tolerated with a goal of at least 150 minutes of moderate physical activity per week.   You can tell that you are exercising at a moderate intensity if your heart starts beating faster and you start breathing faster but can still hold a  conversation.  Moderate-intensity exercise ideas include:  Walking 1 mile (1.6 km) in about 15 minutes  Biking  Hiking  Golfing  Dancing  Water aerobics  Patient will verbalize understanding of everyday activities that increase physical activity by providing examples like the following: ? Yard work, such as: ? Pushing a Conservation officer, nature ? Raking and bagging leaves ? Washing your car ? Pushing a stroller ? Shoveling snow ? Gardening ? Washing windows or floors  Patient will be able to explain general safety guidelines for exercising:   Before you start a new exercise program, talk with your health care provider.  Do not exercise so much that you hurt yourself, feel dizzy, or get very short of breath.  Wear comfortable clothes and wear shoes with good support.  Drink plenty of water while you exercise to prevent dehydration or heat stroke.  Work out until your breathing and your heartbeat get faster.      Have 3 meals a day       08/10/2019 AWV Goal: Improved Nutrition/Diet   Patient will verbalize understanding that diet plays an important role in overall health and that a poor diet is a risk factor for many chronic medical conditions.   Over the next year, patient will improve self management of their diet by incorporating better variety, less frequent dining out, more consistent meal timing, increased physical activity, improved protein intake, and better food choices.  Patient will utilize available community resources to help with food acquisition if needed (ex: food pantries, [address], etc)  Patient will work with nutrition specialist if a referral was made  This is a list of Health Maintenance Items that are overdue or due now: Health Maintenance Due  Topic Date Due   TETANUS/TDAP  08/10/2016     Orders/Referrals Placed Today: No orders of the defined types were placed in this encounter.  (Contact our referral department at [phone] if you  have not spoken with someone about your referral appointment within the next 5 days)    Follow-up Plan Follow up with Dr. Warrick Parisian as scheduled on Monday 08/20/2019 Will check on the price of Shingles and tetanus vaccine at office visit Schedule an Eye Exam  Look over Advance directive information given below and discuss with family   Advance Directive  Advance directives are legal documents that let you make choices ahead of time about your health care and medical treatment in case you become unable to communicate for yourself. Advance directives are a way for you to make known your wishes to family, friends, and health care providers. This can let others know about your end-of-life care if you become unable to communicate. Discussing and writing advance directives should happen over time rather than all at once. Advance directives can be changed depending on your situation and what you want, even after you have signed the advance directives. There are different types of advance directives, such as:  Medical power of attorney.  Living will.  Do not resuscitate (DNR) or do not attempt resuscitation (DNAR) order. Health care proxy and medical power of attorney A health care proxy is also called a health care agent. This is a person who is appointed to make medical decisions for you in cases where you are unable to make the decisions yourself. Generally, people choose someone they know well and trust to represent their preferences. Make sure to ask this person for an agreement to act as your proxy. A proxy may have to exercise judgment in the event of a medical decision for which your wishes are not known. A medical power of attorney is a legal document that names your health care proxy. Depending on the laws in your state, after the document is written, it may also need to be:  Signed.  Notarized.  Dated.  Copied.  Witnessed.  Incorporated into your medical record. You may also  want to appoint someone to manage your money in a situation in which you are unable to do so. This is called a durable power of attorney for finances. It is a separate legal document from the durable power of attorney for health care. You may choose the same person or someone different from your health care proxy to act as your agent in money matters. If you do not appoint a proxy, or if there is a concern that the proxy is not acting in your best interests, a court may appoint a guardian to act on your behalf. Living will A living will is a set of instructions that state your wishes about medical care when you cannot express them yourself. Health care providers should keep a copy of your living will in your medical record. You may want to give a copy to family members or friends. To alert caregivers in case of an emergency, you can place a card in your wallet to let them know that you have a living will and where they can find it. A living will is used if you become:  Terminally ill.  Disabled.  Unable to communicate or make decisions. Items to consider in your living will include:  To  use or not to use life-support equipment, such as dialysis machines and breathing machines (ventilators).  A DNR or DNAR order. This tells health care providers not to use cardiopulmonary resuscitation (CPR) if breathing or heartbeat stops.  To use or not to use tube feeding.  To be given or not to be given food and fluids.  Comfort (palliative) care when the goal becomes comfort rather than a cure.  Donation of organs and tissues. A living will does not give instructions for distributing your money and property if you should pass away. DNR or DNAR A DNR or DNAR order is a request not to have CPR in the event that your heart stops beating or you stop breathing. If a DNR or DNAR order has not been made and shared, a health care provider will try to help any patient whose heart has stopped or who has stopped  breathing. If you plan to have surgery, talk with your health care provider about how your DNR or DNAR order will be followed if problems occur. What if I do not have an advance directive? If you do not have an advance directive, some states assign family decision makers to act on your behalf based on how closely you are related to them. Each state has its own laws about advance directives. You may want to check with your health care provider, attorney, or state representative about the laws in your state. Summary  Advance directives are the legal documents that allow you to make choices ahead of time about your health care and medical treatment in case you become unable to tell others about your care.  The process of discussing and writing advance directives should happen over time. You can change the advance directives, even after you have signed them.  Advance directives include DNR or DNAR orders, living wills, and designating an agent as your medical power of attorney. This information is not intended to replace advice given to you by your health care provider. Make sure you discuss any questions you have with your health care provider. Document Revised: 10/26/2018 Document Reviewed: 10/26/2018 Elsevier Patient Education  [place].     Zoster Vaccine, Recombinant injection What is this medicine? ZOSTER VACCINE (ZOS ter vak SEEN) is used to prevent shingles in adults 76 years old and over. This vaccine is not used to treat shingles or nerve pain from shingles. This medicine may be used for other purposes; ask your health care provider or pharmacist if you have questions. COMMON BRAND NAME(S): [facility] What should I tell my health care provider before I take this medicine? They need to know if you have any of these conditions:  blood disorders or disease  cancer like leukemia or lymphoma  immune system problems or therapy  an unusual or allergic reaction to vaccines, other  medications, foods, dyes, or preservatives  pregnant or trying to get pregnant  breast-feeding How should I use this medicine? This vaccine is for injection in a muscle. It is given by a health care professional. Talk to your pediatrician regarding the use of this medicine in children. This medicine is not approved for use in children. Overdosage: If you think you have taken too much of this medicine contact a poison control center or emergency room at once. NOTE: This medicine is only for you. Do not share this medicine with others. What if I miss a dose? Keep appointments for follow-up (booster) doses as directed. It is important not to miss your dose. Call  your doctor or health care professional if you are unable to keep an appointment. What may interact with this medicine?  medicines that suppress your immune system  medicines to treat cancer  steroid medicines like prednisone or cortisone This list may not describe all possible interactions. Give your health care provider a list of all the medicines, herbs, non-prescription drugs, or dietary supplements you use. Also tell them if you smoke, drink alcohol, or use illegal drugs. Some items may interact with your medicine. What should I watch for while using this medicine? Visit your doctor for regular check ups. This vaccine, like all vaccines, may not fully protect everyone. What side effects may I notice from receiving this medicine? Side effects that you should report to your doctor or health care professional as soon as possible:  allergic reactions like skin rash, itching or hives, swelling of the face, lips, or tongue  breathing problems Side effects that usually do not require medical attention (report these to your doctor or health care professional if they continue or are bothersome):  chills  headache  fever  nausea, vomiting  redness, warmth, pain, swelling or itching at site where injected  tiredness This list  may not describe all possible side effects. Call your doctor for medical advice about side effects. You may report side effects to FDA at 1-800-FDA-1088. Where should I keep my medicine? This vaccine is only given in a clinic, pharmacy, doctor's office, or other health care setting and will not be stored at home. NOTE: This sheet is a summary. It may not cover all possible information. If you have questions about this medicine, talk to your doctor, pharmacist, or health care provider.  2020 Elsevier/Gold Standard (2016-11-08 13:20:30)    https://www.cdc.gov/vaccines/hcp/vis/vis-statements/tdap.pdf">  Tdap (Tetanus, Diphtheria, Pertussis) Vaccine: What You Need to Know 1. Why get vaccinated? Tdap vaccine can prevent tetanus, diphtheria, and pertussis. Diphtheria and pertussis spread from person to person. Tetanus enters the body through cuts or wounds.  TETANUS (T) causes painful stiffening of the muscles. Tetanus can lead to serious health problems, including being unable to open the mouth, having trouble swallowing and breathing, or death.  DIPHTHERIA (D) can lead to difficulty breathing, heart failure, paralysis, or death.  PERTUSSIS (aP), also known as "whooping cough," can cause uncontrollable, violent coughing which makes it hard to breathe, eat, or drink. Pertussis can be extremely serious in babies and young children, causing pneumonia, convulsions, brain damage, or death. In teens and adults, it can cause weight loss, loss of bladder control, passing out, and rib fractures from severe coughing. 2. Tdap vaccine Tdap is only for children 7 years and older, adolescents, and adults.  Adolescents should receive a single dose of Tdap, preferably at age 13 or 17 years. Pregnant women should get a dose of Tdap during every pregnancy, to protect the newborn from pertussis. Infants are most at risk for severe, life-threatening complications from pertussis. Adults who have never received Tdap  should get a dose of Tdap. Also, adults should receive a booster dose every 10 years, or earlier in the case of a severe and dirty wound or burn. Booster doses can be either Tdap or Td (a different vaccine that protects against tetanus and diphtheria but not pertussis). Tdap may be given at the same time as other vaccines. 3. Talk with your health care provider Tell your vaccine provider if the person getting the vaccine:  Has had an allergic reaction after a previous dose of any vaccine that protects against  tetanus, diphtheria, or pertussis, or has any severe, life-threatening allergies.  Has had a coma, decreased level of consciousness, or prolonged seizures within 7 days after a previous dose of any pertussis vaccine (DTP, DTaP, or Tdap).  Has seizures or another nervous system problem.  Has ever had Guillain-Barr Syndrome (also called GBS).  Has had severe pain or swelling after a previous dose of any vaccine that protects against tetanus or diphtheria. In some cases, your health care provider may decide to postpone Tdap vaccination to a future visit.  People with minor illnesses, such as a cold, may be vaccinated. People who are moderately or severely ill should usually wait until they recover before getting Tdap vaccine.  Your health care provider can give you more information. 4. Risks of a vaccine reaction  Pain, redness, or swelling where the shot was given, mild fever, headache, feeling tired, and nausea, vomiting, diarrhea, or stomachache sometimes happen after Tdap vaccine. People sometimes faint after medical procedures, including vaccination. Tell your provider if you feel dizzy or have vision changes or ringing in the ears.  As with any medicine, there is a very remote chance of a vaccine causing a severe allergic reaction, other serious injury, or death. 5. What if there is a serious problem? An allergic reaction could occur after the vaccinated person leaves the clinic. If  you see signs of a severe allergic reaction (hives, swelling of the face and throat, difficulty breathing, a fast heartbeat, dizziness, or weakness), call 9-1-1 and get the person to the nearest hospital. For other signs that concern you, call your health care provider.  Adverse reactions should be reported to the Vaccine Adverse Event Reporting System (VAERS). Your health care provider will usually file this report, or you can do it yourself. Visit the VAERS website at www.vaers.SamedayNews.es or call (540)347-9580. VAERS is only for reporting reactions, and VAERS staff do not give medical advice. 6. The National Vaccine Injury Compensation Program The [employer] Vaccine Injury Compensation Program (VICP) is a federal program that was created to compensate people who may have been injured by certain vaccines. Visit the VICP website at GoldCloset.com.ee or call 905-068-4666 to learn about the program and about filing a claim. There is a time limit to file a claim for compensation. 7. How can I learn more?  Ask your health care provider.  Call your local or state health department.  Contact the Centers for Disease Control and Prevention (CDC): ? Call 5613486191 (1-800-CDC-INFO) or ? Visit CDC's website at http://hunter.com/ Vaccine Information Statement Tdap (Tetanus, Diphtheria, Pertussis) Vaccine (07/12/2018) This information is not intended to replace advice given to you by your health care provider. Make sure you discuss any questions you have with your health care provider. Document Revised: 07/21/2018 Document Reviewed: 07/24/2018 Elsevier Patient Education  [place].

## 2019-08-13 ENCOUNTER — Ambulatory Visit: Payer: Medicare Other | Admitting: Family Medicine

## 2019-08-20 ENCOUNTER — Ambulatory Visit (INDEPENDENT_AMBULATORY_CARE_PROVIDER_SITE_OTHER): Payer: Medicare Other | Admitting: Family Medicine

## 2019-08-20 ENCOUNTER — Encounter: Payer: Self-pay | Admitting: Family Medicine

## 2019-08-20 ENCOUNTER — Other Ambulatory Visit: Payer: Self-pay

## 2019-08-20 VITALS — BP 127/73 | HR 78 | Temp 97.2°F | Ht 71.5 in | Wt 235.0 lb

## 2019-08-20 DIAGNOSIS — R7303 Prediabetes: Secondary | ICD-10-CM

## 2019-08-20 DIAGNOSIS — E785 Hyperlipidemia, unspecified: Secondary | ICD-10-CM | POA: Diagnosis not present

## 2019-08-20 DIAGNOSIS — I10 Essential (primary) hypertension: Secondary | ICD-10-CM | POA: Diagnosis not present

## 2019-08-20 DIAGNOSIS — J449 Chronic obstructive pulmonary disease, unspecified: Secondary | ICD-10-CM

## 2019-08-20 DIAGNOSIS — N400 Enlarged prostate without lower urinary tract symptoms: Secondary | ICD-10-CM

## 2019-08-20 NOTE — Progress Notes (Signed)
BP 127/73   Pulse 78   Temp (!) 97.2 F (36.2 C)   Ht 5' 11.5" (1.816 m)   Wt 235 lb (106.6 kg)   SpO2 95%   BMI 32.32 kg/m    Subjective:   Patient ID: Connor [name], male    DOB: [birth year], 74 y.o.   MRN: [id number]  HPI: Connor [name] is a 74 y.o. male presenting on 08/20/2019 for Medical Management of Chronic Issues (51m and Hypertension   HPI Prediabetes Patient comes in today for recheck of his diabetes. Patient has been currently taking no medication currently and will check A1c monitor. Patient is currently on an ACE inhibitor/ARB. Patient has not seen an ophthalmologist this year. Patient denies any issues with their feet. The symptom started onset as an adult hypertension and cholesterol ARE RELATED TO DM   Hypertension Patient is currently on losartan, and their blood pressure today is 127/73. Patient denies any lightheadedness or dizziness. Patient denies headaches, blurred vision, chest pains, shortness of breath, or weakness. Denies any side effects from medication and is content with current medication.   Hyperlipidemia Patient is coming in for recheck of his hyperlipidemia. The patient is currently taking Crestor. They deny any issues with myalgias or history of liver damage from it. They deny any focal numbness or weakness or chest pain.   COPD Patient is coming in for COPD recheck today.  He is currently on Combivent.  He has a mild chronic cough but denies any major coughing spells or wheezing spells.  He has 0nighttime symptoms per week and 0daytime symptoms per week currently.   BPH Patient is coming in for recheck on BPH Symptoms: None Medication: None currently Last PSA: 02/05/2019, 1.9  Relevant past medical, surgical, family and social history reviewed and updated as indicated. Interim medical history since our last visit reviewed. Allergies and medications reviewed and updated.  Review of Systems  Constitutional: Negative for chills and fever.    Eyes: Negative for visual disturbance.  Respiratory: Negative for shortness of breath and wheezing.   Cardiovascular: Negative for chest pain and leg swelling.  Musculoskeletal: Negative for back pain and gait problem.  Skin: Negative for rash.  Neurological: Negative for dizziness, weakness and numbness.  All other systems reviewed and are negative.   Per HPI unless specifically indicated above   Allergies as of 08/20/2019      Reactions   Dexlansoprazole Nausea And Vomiting   Celecoxib Other (See Comments)   Solid white - generic pill only = caused diarrhea (other generics ok)  Solid white - generic pill only = caused diarrhea (other generics ok)       Medication List       Accurate as of Aug 20, 2019 11:59 PM. If you have any questions, ask your nurse or doctor.        alfuzosin 10 MG 24 hr tablet Commonly known as: UROXATRAL Take 10 mg by mouth every morning.   aspirin 81 MG tablet Take 81 mg by mouth 2 (two) times daily.   Black Elderberry 50 MG/5ML Syrp Take by mouth daily. 2 tablespoons.   Centrum Silver Ultra Mens Tabs Take 1 tablet by mouth every morning. Once a day   cetirizine 10 MG tablet Commonly known as: ZYRTEC Take 10 mg by mouth daily.   CO Q 10 PO Take 1 capsule by mouth daily.   ferrous sulfate 325 (65 FE) MG tablet Take 1 tablet (325 mg total) by mouth daily with  breakfast.   Glucosamine Chondr 1500 Complx Caps Take 1 capsule by mouth daily.   icosapent Ethyl 1 g capsule Commonly known as: Vascepa Take 2 capsules (2 g total) by mouth 2 (two) times daily.   Ipratropium-Albuterol 20-100 MCG/ACT Aers respimat Commonly known as: COMBIVENT Inhale 1 puff into the lungs as needed for wheezing or shortness of breath.   losartan 50 MG tablet Commonly known as: COZAAR Take 1 tablet (50 mg total) by mouth daily. What changed: how much to take   montelukast 10 MG tablet Commonly known as: SINGULAIR Take 1 tablet (10 mg total) by mouth at  bedtime.   NexIUM 24HR 20 MG Tbec Generic drug: Esomeprazole Magnesium Nexium 24HR 20 mg tablet,delayed release   PHILLIPS COLON HEALTH PO Take 1 capsule by mouth daily.   pyridOXINE 100 MG tablet Commonly known as: VITAMIN B-6 Take 100 mg by mouth daily.   rosuvastatin 10 MG tablet Commonly known as: CRESTOR TAKE 1 TABLET (10 MG TOTAL) BY MOUTH DAILY. What changed:   how much to take  how to take this  when to take this  additional instructions   sildenafil 20 MG tablet Commonly known as: REVATIO Take 2-5 tabs prior to sexual activity What changed:   how much to take  how to take this  when to take this  reasons to take this  additional instructions   Turmeric 500 MG Caps Take 1 tablet by mouth daily.   vitamin B-12 500 MCG tablet Commonly known as: CYANOCOBALAMIN Take 500 mcg by mouth daily.   vitamin C 100 MG tablet Take 100 mg by mouth daily.   Vitamin D-3 125 MCG (5000 UT) Tabs Take 1 tablet by mouth daily.   Zinc 50 MG Tabs Take by mouth daily.        Objective:   BP 127/73   Pulse 78   Temp (!) 97.2 F (36.2 C)   Ht 5' 11.5" (1.816 m)   Wt 235 lb (106.6 kg)   SpO2 95%   BMI 32.32 kg/m   Wt Readings from Last 3 Encounters:  08/20/19 235 lb (106.6 kg)  08/10/19 234 lb 2.1 oz (106.2 kg)  07/11/19 234 lb 1.9 oz (106.2 kg)    Physical Exam Vitals and nursing note reviewed.  Constitutional:      General: He is not in acute distress.    Appearance: He is well-developed. He is not diaphoretic.  Eyes:     General: No scleral icterus.    Conjunctiva/sclera: Conjunctivae normal.  Neck:     Thyroid: No thyromegaly.  Cardiovascular:     Rate and Rhythm: Normal rate and regular rhythm.     Heart sounds: Normal heart sounds. No murmur.  Pulmonary:     Effort: Pulmonary effort is normal. No respiratory distress.     Breath sounds: Normal breath sounds. No wheezing.  Musculoskeletal:        General: Normal range of motion.      Cervical back: Neck supple.  Lymphadenopathy:     Cervical: No cervical adenopathy.  Skin:    General: Skin is warm and dry.     Findings: No rash.  Neurological:     Mental Status: He is alert and oriented to person, place, and time.     Coordination: Coordination normal.  Psychiatric:        Behavior: Behavior normal.       Assessment & Plan:   Problem List Items Addressed This Visit  Cardiovascular and Mediastinum   ESSENTIAL HYPERTENSION, BENIGN     Respiratory   COPD, mild (HCC) - Primary (Chronic)     Genitourinary   BPH (benign prostatic hyperplasia)   Relevant Orders   PSA, total and free     Other   Hyperlipidemia LDL goal <100   Relevant Orders   CMP14+EGFR   Lipid panel   Prediabetes   Relevant Orders   CBC with Differential/Platelet   CMP14+EGFR   Bayer DCA Hb A1c Waived      Continue current medication, no change, will check blood work. Follow up plan: Return in about 3 months (around 11/20/2019), or if symptoms worsen or fail to improve, for Hypertension and cholesterol recheck.  Counseling provided for all of the vaccine components Orders Placed This Encounter  Procedures  . CBC with Differential/Platelet  . CMP14+EGFR  . Lipid panel  . PSA, total and free  . [facility] Hb A1c [place], MD [facility] 08/26/2019, 8:57 PM

## 2019-08-29 DIAGNOSIS — M25551 Pain in right hip: Secondary | ICD-10-CM | POA: Diagnosis not present

## 2019-08-30 ENCOUNTER — Other Ambulatory Visit: Payer: Self-pay | Admitting: Family Medicine

## 2019-09-05 ENCOUNTER — Other Ambulatory Visit: Payer: Self-pay | Admitting: Hematology & Oncology

## 2019-09-05 DIAGNOSIS — R7303 Prediabetes: Secondary | ICD-10-CM

## 2019-09-05 DIAGNOSIS — D510 Vitamin B12 deficiency anemia due to intrinsic factor deficiency: Secondary | ICD-10-CM

## 2019-09-07 ENCOUNTER — Other Ambulatory Visit: Payer: Self-pay | Admitting: Family Medicine

## 2019-10-11 ENCOUNTER — Telehealth: Payer: Self-pay | Admitting: Hematology & Oncology

## 2019-10-11 ENCOUNTER — Inpatient Hospital Stay (HOSPITAL_BASED_OUTPATIENT_CLINIC_OR_DEPARTMENT_OTHER): Payer: Medicare Other | Admitting: Hematology & Oncology

## 2019-10-11 ENCOUNTER — Encounter: Payer: Self-pay | Admitting: Hematology & Oncology

## 2019-10-11 ENCOUNTER — Inpatient Hospital Stay: Payer: Medicare Other

## 2019-10-11 ENCOUNTER — Other Ambulatory Visit: Payer: Self-pay

## 2019-10-11 ENCOUNTER — Inpatient Hospital Stay: Payer: Medicare Other | Attending: Hematology & Oncology

## 2019-10-11 VITALS — BP 157/68 | HR 84 | Temp 96.9°F | Resp 18 | Wt 229.0 lb

## 2019-10-11 DIAGNOSIS — Z7982 Long term (current) use of aspirin: Secondary | ICD-10-CM | POA: Insufficient documentation

## 2019-10-11 DIAGNOSIS — D45 Polycythemia vera: Secondary | ICD-10-CM

## 2019-10-11 DIAGNOSIS — D510 Vitamin B12 deficiency anemia due to intrinsic factor deficiency: Secondary | ICD-10-CM

## 2019-10-11 DIAGNOSIS — Z79899 Other long term (current) drug therapy: Secondary | ICD-10-CM | POA: Diagnosis not present

## 2019-10-11 DIAGNOSIS — R7303 Prediabetes: Secondary | ICD-10-CM

## 2019-10-11 LAB — CBC WITH DIFFERENTIAL (CANCER CENTER ONLY)
Abs Immature Granulocytes: 0.01 10*3/uL (ref 0.00–0.07)
Basophils Absolute: 0 10*3/uL (ref 0.0–0.1)
Basophils Relative: 1 %
Eosinophils Absolute: 0.2 10*3/uL (ref 0.0–0.5)
Eosinophils Relative: 3 %
HCT: 51.1 % (ref 39.0–52.0)
Hemoglobin: 17 g/dL (ref 13.0–17.0)
Immature Granulocytes: 0 %
Lymphocytes Relative: 19 %
Lymphs Abs: 1 10*3/uL (ref 0.7–4.0)
MCH: 30.3 pg (ref 26.0–34.0)
MCHC: 33.3 g/dL (ref 30.0–36.0)
MCV: 91.1 fL (ref 80.0–100.0)
Monocytes Absolute: 0.4 10*3/uL (ref 0.1–1.0)
Monocytes Relative: 7 %
Neutro Abs: 3.8 10*3/uL (ref 1.7–7.7)
Neutrophils Relative %: 70 %
Platelet Count: 124 10*3/uL — ABNORMAL LOW (ref 150–400)
RBC: 5.61 MIL/uL (ref 4.22–5.81)
RDW: 12.6 % (ref 11.5–15.5)
WBC Count: 5.4 10*3/uL (ref 4.0–10.5)
nRBC: 0 % (ref 0.0–0.2)

## 2019-10-11 LAB — TSH: TSH: 1.465 u[IU]/mL (ref 0.320–4.118)

## 2019-10-11 LAB — CMP (CANCER CENTER ONLY)
ALT: 14 U/L (ref 0–44)
AST: 16 U/L (ref 15–41)
Albumin: 4.6 g/dL (ref 3.5–5.0)
Alkaline Phosphatase: 59 U/L (ref 38–126)
Anion gap: 7 (ref 5–15)
BUN: 28 mg/dL — ABNORMAL HIGH (ref 8–23)
CO2: 27 mmol/L (ref 22–32)
Calcium: 9.6 mg/dL (ref 8.9–10.3)
Chloride: 109 mmol/L (ref 98–111)
Creatinine: 1.07 mg/dL (ref 0.61–1.24)
GFR, Est AFR Am: >60 mL/min (ref >60)
GFR, Estimated: >60 mL/min (ref >60)
Glucose, Bld: 124 mg/dL — ABNORMAL HIGH (ref 70–99)
Potassium: 3.9 mmol/L (ref 3.5–5.1)
Sodium: 143 mmol/L (ref 135–145)
Total Bilirubin: 0.8 mg/dL (ref 0.3–1.2)
Total Protein: 6.7 g/dL (ref 6.5–8.1)

## 2019-10-11 LAB — VITAMIN B12: Vitamin B-12: 561 pg/mL (ref 180–914)

## 2019-10-11 LAB — IRON AND TIBC
Iron: 123 ug/dL (ref 42–163)
Saturation Ratios: 39 % (ref 20–55)
TIBC: 314 ug/dL (ref 202–409)
UIBC: 191 ug/dL (ref 117–376)

## 2019-10-11 LAB — FERRITIN: Ferritin: 41 ng/mL (ref 24–336)

## 2019-10-11 LAB — LACTATE DEHYDROGENASE: LDH: 121 U/L (ref 98–192)

## 2019-10-11 NOTE — Progress Notes (Signed)
Hematology and Oncology Follow Up Visit  Connor [name] [id number] [birth year] 74 y.o. 10/11/2019   Principle Diagnosis:   Polycythemia vera-Triple negative  Chronic immune thrombocytopenia vs medication  Transient iron deficiency secondary to phlebotomies  Current Therapy:    Phlebotomy to maintain hematocrit below 45% -- patient goes to the [facility]  Aspirin 81 mg by mouth twice daily   IV iron as indicated - last dose given on 06/02/2016     Interim History:  [name] is back for followup.  Overall, he is doing quite well.  He and his wife have been pretty busy.  She, unfortunately, is having a lot of neck problems.  There is been no problems with nausea or vomiting.  There is been no problems with headache.  He is very conscientious when he comes to being outside and being in the sun.  It sounds like the family will be going to [place] for vacation and July.  He has had no problems with bowels or bladder.  He has had no cough or shortness of breath.  There has been no leg swelling.  He has had some arthritic issues with his knees.      Overall, his performance status is ECOG 0.   Medications:  Current Outpatient Medications:    alfuzosin (UROXATRAL) 10 MG 24 hr tablet, Take 10 mg by mouth every morning. , Disp: , Rfl:    Ascorbic Acid (VITAMIN C) 100 MG tablet, Take 100 mg by mouth daily., Disp: , Rfl:    aspirin 81 MG tablet, Take 81 mg by mouth 2 (two) times daily. , Disp: , Rfl:    Black Elderberry 50 MG/5ML SYRP, Take by mouth daily. 2 tablespoons., Disp: , Rfl:    cetirizine (ZYRTEC) 10 MG tablet, Take 10 mg by mouth daily., Disp: , Rfl:    Cholecalciferol (VITAMIN D-3) 5000 UNITS TABS, Take 1 tablet by mouth daily. , Disp: , Rfl:    Coenzyme Q10 (CO Q 10 PO), Take 1 capsule by mouth daily. , Disp: , Rfl:    cyclobenzaprine (FLEXERIL) 10 MG tablet, TAKE ONE TABLET BY MOUTH THREE TIMES DAILY AS NEEDED FOR MUSCLE SPASMS, Disp: 30 tablet, Rfl: 1    Esomeprazole Magnesium (NEXIUM 24HR) 20 MG TBEC, Nexium 24HR 20 mg tablet,delayed release, Disp: , Rfl:    ferrous sulfate 325 (65 FE) MG tablet, Take 1 tablet (325 mg total) by mouth daily with breakfast., Disp: 90 tablet, Rfl: 3   Glucosamine-Chondroit-Vit C-Mn (GLUCOSAMINE CHONDR 1500 COMPLX) CAPS, Take 1 capsule by mouth daily. , Disp: , Rfl:    Ipratropium-Albuterol (COMBIVENT) 20-100 MCG/ACT AERS respimat, Inhale 1 puff into the lungs as needed for wheezing or shortness of breath., Disp: 3 Inhaler, Rfl: 3   losartan (COZAAR) 50 MG tablet, Take 1 tablet (50 mg total) by mouth daily. (Patient taking differently: Take 25 mg by mouth daily. ), Disp: 90 tablet, Rfl: 3   montelukast (SINGULAIR) 10 MG tablet, Take 1 tablet (10 mg total) by mouth at bedtime., Disp: 90 tablet, Rfl: 3   Multiple Vitamins-Minerals (CENTRUM SILVER ULTRA MENS) TABS, Take 1 tablet by mouth every morning. Once a day, Disp: , Rfl:    Probiotic Product (Green), Take 1 capsule by mouth daily. , Disp: , Rfl:    pyridOXINE (VITAMIN B-6) 100 MG tablet, Take 100 mg by mouth daily. , Disp: , Rfl:    rosuvastatin (CRESTOR) 10 MG tablet, TAKE ONE TABLET BY MOUTH DAILY, Disp:  90 tablet, Rfl: 1   sildenafil (REVATIO) 20 MG tablet, Take 2-5 tabs prior to sexual activity (Patient taking differently: Take 20 mg by mouth 3 (three) times daily as needed (For sexual activity). ), Disp: 50 tablet, Rfl: 1   Turmeric 500 MG CAPS, Take 1 tablet by mouth daily. , Disp: , Rfl:    vitamin B-12 (CYANOCOBALAMIN) 500 MCG tablet, Take 500 mcg by mouth daily., Disp: , Rfl:    Zinc 50 MG TABS, Take by mouth daily., Disp: , Rfl:   Allergies:  Allergies  Allergen Reactions   Dexlansoprazole Nausea And Vomiting   Celecoxib Other (See Comments)    Solid white - generic pill only = caused diarrhea (other generics ok)  Solid white - generic pill only = caused diarrhea (other generics ok)     Past Medical History,  Surgical history, Social history, and Family History were reviewed and updated.  Review of Systems: Review of Systems  Constitutional: Negative.   HENT: Negative.   Eyes: Negative.   Respiratory: Negative.   Cardiovascular: Negative.   Gastrointestinal: Negative.   Genitourinary: Negative.   Musculoskeletal: Negative.   Skin: Negative.   Neurological: Negative.   Endo/Heme/Allergies: Negative.   Psychiatric/Behavioral: Negative.  Negative for depression.     Physical Exam:  weight is 229 lb (103.9 kg). His temporal temperature is 96.9 F (36.1 C) (abnormal). His blood pressure is 157/68 (abnormal) and his pulse is 84. His respiration is 18 and oxygen saturation is 97%.  Saturday and Sunday in [place] and on he was in [place] over the next couple weeks I Connor [name].  [place]  Physical Exam Vitals reviewed.  HENT:     Head: Normocephalic and atraumatic.  Eyes:     Pupils: Pupils are equal, round, and reactive to light.  Cardiovascular:     Rate and Rhythm: Normal rate and regular rhythm.     Heart sounds: Normal heart sounds.  Pulmonary:     Effort: Pulmonary effort is normal.     Breath sounds: Normal breath sounds.  Abdominal:     General: Bowel sounds are normal.     Palpations: Abdomen is soft.  Musculoskeletal:        General: No tenderness or deformity. Normal range of motion.     Cervical back: Normal range of motion.  Lymphadenopathy:     Cervical: No cervical adenopathy.  Skin:    General: Skin is warm and dry.     Findings: No erythema or rash.  Neurological:     Mental Status: He is alert and oriented to person, place, and time.  Psychiatric:        Behavior: Behavior normal.        Thought Content: Thought content normal.        Judgment: Judgment normal.     Lab Results  Component Value Date   WBC 5.4 10/11/2019   HGB 17.0 10/11/2019   HCT 51.1 10/11/2019   MCV 91.1 10/11/2019   PLT 124 (L) 10/11/2019     Chemistry       Component Value Date/Time   NA 143 10/11/2019 0858   NA 145 (H) 02/05/2019 0930   NA 139 04/13/2017 1150   NA 141 07/09/2016 1038   K 3.9 10/11/2019 0858   K 4.0 04/13/2017 1150   K 4.0 07/09/2016 1038   CL 109 10/11/2019 0858   CL 104 04/13/2017 1150   CO2 27 10/11/2019 0858   CO2 26 04/13/2017  1150   CO2 22 07/09/2016 1038   BUN 28 (H) 10/11/2019 0858   BUN 18 02/05/2019 0930   BUN 17 04/13/2017 1150   BUN 19.0 07/09/2016 1038   CREATININE 1.07 10/11/2019 0858   CREATININE 1.0 04/13/2017 1150   CREATININE 0.9 07/09/2016 1038      Component Value Date/Time   CALCIUM 9.6 10/11/2019 0858   CALCIUM 9.4 04/13/2017 1150   CALCIUM 9.5 07/09/2016 1038   ALKPHOS 59 10/11/2019 0858   ALKPHOS 64 04/13/2017 1150   ALKPHOS 85 07/09/2016 1038   AST 16 10/11/2019 0858   AST 22 07/09/2016 1038   ALT 14 10/11/2019 0858   ALT 26 04/13/2017 1150   ALT 22 07/09/2016 1038   BILITOT 0.8 10/11/2019 0858   BILITOT 0.62 07/09/2016 1038      Impression and Plan: Mr. Eddinger is a 74 year old gentleman with polycythemia. He goes to the [facility] today for another donation.  This I think is very very helpful.  I think you for the [facility], you cannot donate except every 2 or 3 months.  We will plan to get him back in another 6 weeks.  We will see how his blood count looks and possibly phlebotomize him.  He will continue on his baby aspirin.    Volanda Napoleon, MD 7/1/202110:18 AM

## 2019-10-11 NOTE — Telephone Encounter (Signed)
Appointments scheduled calendar printed per 7/1 los

## 2019-10-12 LAB — TESTOSTERONE: Testosterone: 262 ng/dL — ABNORMAL LOW (ref 264–916)

## 2019-10-15 ENCOUNTER — Encounter (INDEPENDENT_AMBULATORY_CARE_PROVIDER_SITE_OTHER): Payer: Self-pay | Admitting: Family Medicine

## 2019-10-18 ENCOUNTER — Other Ambulatory Visit: Payer: Self-pay

## 2019-10-18 ENCOUNTER — Ambulatory Visit (INDEPENDENT_AMBULATORY_CARE_PROVIDER_SITE_OTHER): Payer: Medicare Other | Admitting: Family Medicine

## 2019-10-18 ENCOUNTER — Encounter (INDEPENDENT_AMBULATORY_CARE_PROVIDER_SITE_OTHER): Payer: Self-pay | Admitting: Family Medicine

## 2019-10-18 VITALS — BP 146/62 | HR 78 | Temp 98.2°F | Ht 71.0 in | Wt 223.0 lb

## 2019-10-18 DIAGNOSIS — E7849 Other hyperlipidemia: Secondary | ICD-10-CM | POA: Diagnosis not present

## 2019-10-18 DIAGNOSIS — E669 Obesity, unspecified: Secondary | ICD-10-CM | POA: Diagnosis not present

## 2019-10-18 DIAGNOSIS — I10 Essential (primary) hypertension: Secondary | ICD-10-CM

## 2019-10-18 DIAGNOSIS — Z6831 Body mass index (BMI) 31.0-31.9, adult: Secondary | ICD-10-CM

## 2019-10-18 DIAGNOSIS — F3289 Other specified depressive episodes: Secondary | ICD-10-CM | POA: Diagnosis not present

## 2019-10-18 DIAGNOSIS — R7303 Prediabetes: Secondary | ICD-10-CM

## 2019-10-18 DIAGNOSIS — G4733 Obstructive sleep apnea (adult) (pediatric): Secondary | ICD-10-CM

## 2019-10-18 DIAGNOSIS — E559 Vitamin D deficiency, unspecified: Secondary | ICD-10-CM

## 2019-10-18 DIAGNOSIS — Z1331 Encounter for screening for depression: Secondary | ICD-10-CM

## 2019-10-18 DIAGNOSIS — Z0289 Encounter for other administrative examinations: Secondary | ICD-10-CM

## 2019-10-18 DIAGNOSIS — R0602 Shortness of breath: Secondary | ICD-10-CM | POA: Diagnosis not present

## 2019-10-18 DIAGNOSIS — R5383 Other fatigue: Secondary | ICD-10-CM

## 2019-10-18 NOTE — Progress Notes (Signed)
Chief Complaint:   Connor [name] (MR# [id number]) is a 74 y.o. male who presents for evaluation and treatment of obesity and related comorbidities. Current BMI is Body mass index is 31.1 kg/m. Shondell has been struggling with his weight for many years and has been unsuccessful in either losing weight, maintaining weight loss, or reaching his healthy weight goal.  Troi is currently in the action stage of change and ready to dedicate time achieving and maintaining a healthier weight. Bucky is interested in becoming our patient and working on intensive lifestyle modifications including (but not limited to) diet and exercise for weight loss.  Tarron is retired from [employer], and lives with his wife, Hoyle Sauer, who is doing the program as well.  He does not do any formal cardio, but does "flexibility and strengthening exercises" 4 days a week for 30-45 minutes!  He has tried to lose by portion control in the past.  His heaviest weight was 239 pounds about 6 months abo.  He was here for 1 hour today as support to his wife, who is joining the program today as well.  Azaan's habits were reviewed today and are as follows: His family eats meals together, he thinks his family will eat healthier with him, his desired weight loss is 38 pounds, he started gaining weight in his 75s, his heaviest weight ever was 239 pounds, he snacks frequently in the evenings, he is frequently drinking liquids with calories, he frequently makes poor food choices and he frequently eats larger portions than normal.  Depression Screen Payton's Food and Mood (modified PHQ-9) score was 2.  Depression screen PHQ 2/9 08/20/2019  Decreased Interest 0  Down, Depressed, Hopeless 0  PHQ - 2 Score 0  Altered sleeping -  Tired, decreased energy -  Change in appetite -  Feeling bad or failure about yourself  -  Trouble concentrating -  Moving slowly or fidgety/restless -  Suicidal thoughts -  PHQ-9 Score  -  Difficult doing work/chores -  Some recent data might be hidden   Subjective:   1. Other fatigue Corwyn denies daytime somnolence and denies waking up still tired.  Chapin generally gets 7 or 8 hours of sleep per night, and states that he has generally restful sleep. Snoring is not present. Apneic episodes are not present. Epworth Sleepiness Score is 5.  2. Shortness of breath on exertion Juanda Crumble notes increasing shortness of breath with exercising and seems to be worsening over time with weight gain. He notes getting out of breath sooner with activity than he used to. This has gotten worse recently. Aldous denies shortness of breath at rest or orthopnea.  3. Essential hypertension Review: taking medications as instructed, no medication side effects noted, no chest pain on exertion, no dyspnea on exertion, no swelling of ankles.  He is taking losartan 25 mg daily.  He does not check his blood pressure at home.  BP Readings from Last 3 Encounters:  10/18/19 (!) 146/62  10/11/19 (!) 157/68  08/20/19 127/73   4. Other hyperlipidemia Froylan has hyperlipidemia and has been trying to improve his cholesterol levels with intensive lifestyle modification including a low saturated fat diet, exercise and weight loss. He denies any chest pain, claudication or myalgias.  He is taking Crestor 10 mg at bedtime.  Lab Results  Component Value Date   ALT 14 10/11/2019   AST 16 10/11/2019   ALKPHOS 59 10/11/2019   BILITOT 0.8 10/11/2019   Lab  Results  Component Value Date   CHOL 102 08/07/2019   HDL 32 (L) 08/07/2019   LDLCALC 53 08/07/2019   TRIG 83 08/07/2019   CHOLHDL 3.2 08/07/2019   5. Prediabetes Trampas has a diagnosis of prediabetes based on his elevated HgA1c and was informed this puts him at greater risk of developing diabetes. He continues to work on diet and exercise to decrease his risk of diabetes. He denies nausea or hypoglycemia.    Lab Results  Component Value Date    HGBA1C 6.0 08/07/2019   6. OSA (obstructive sleep apnea) Toshio has a diagnosis of sleep apnea. He reports that he is using a CPAP regularly.   7. Vitamin D deficiency Dontre's Vitamin D level was 68.2 on 08/07/2019. He is currently taking OTC vitamin D 5000 IU each day. He denies nausea, vomiting or muscle weakness.  8. Depression screening Carnie was screened for depression as part of his new patient workup today.  PHQ-9 is 2.  Assessment/Plan:   1. Other fatigue Janie does feel that his weight is causing his energy to be lower than it should be. Fatigue may be related to obesity, depression or many other causes. Labs will be ordered, and in the meanwhile, Huie will focus on self care including making healthy food choices, increasing physical activity and focusing on stress reduction. - EKG 12-Lead  2. Shortness of breath on exertion Akshat does feel that he gets out of breath more easily that he used to when he exercises. Daniyal's shortness of breath appears to be obesity related and exercise induced. He has agreed to work on weight loss and gradually increase exercise to treat his exercise induced shortness of breath. Will continue to monitor closely.  3. Essential hypertension Andrea is working on healthy weight loss and exercise to improve blood pressure control. We will watch for signs of hypotension as he continues his lifestyle modifications.  Check blood pressure at home once weekly or so.  Continue medications per PCP.  4. Other hyperlipidemia Cardiovascular risk and specific lipid/LDL goals reviewed.  We discussed several lifestyle modifications today and Rosa will continue to work on diet, exercise and weight loss efforts. Orders and follow up as documented in patient record.  Continue medications and treatment plan per PCP.  Will monitor alongside them.  Continue prudent nutritional plan, weight loss.  Counseling Intensive lifestyle modifications are the first  line treatment for this issue. . Dietary changes: Increase soluble fiber. Decrease simple carbohydrates. . Exercise changes: Moderate to vigorous-intensity aerobic activity 150 minutes per week if tolerated. . Lipid-lowering medications: see documented in medical record.  5. Prediabetes Avir will continue to work on weight loss, exercise, and decreasing simple carbohydrates to help decrease the risk of diabetes.  Educated patient on prediabetes/IR as it relates to obesity.  Continue prudent nutritional plan, weight loss.  Will follow alongside PCP. - Insulin, random  6. OSA (obstructive sleep apnea) Intensive lifestyle modifications are the first line treatment for this issue. We discussed several lifestyle modifications today and he will continue to work on diet, exercise and weight loss efforts. We will continue to monitor. Orders and follow up as documented in patient record.  Treatment plan per recommendations of treating physician.  Encouraged compliance, educated regarding OSA as it relates to obesity.  Counseling  Sleep apnea is a condition in which breathing pauses or becomes shallow during sleep. This happens over and over during the night. This disrupts your sleep and keeps your body from getting the rest  that it needs, which can cause tiredness and lack of energy (fatigue) during the day.  Sleep apnea treatment: If you were given a device to open your airway while you sleep, USE IT!  Sleep hygiene:   Limit or avoid alcohol, caffeinated beverages, and cigarettes, especially close to bedtime.   Do not eat a large meal or eat spicy foods right before bedtime. This can lead to digestive discomfort that can make it hard for you to sleep.  Keep a sleep diary to help you and your health care provider figure out what could be causing your insomnia.  . Make your bedroom a dark, comfortable place where it is easy to fall asleep. ? Put up shades or blackout curtains to block light from  outside. ? Use a white noise machine to block noise. ? Keep the temperature cool. . Limit screen use before bedtime. This includes: ? Watching TV. ? Using your smartphone, tablet, or computer. . Stick to a routine that includes going to bed and waking up at the same times every day and night. This can help you fall asleep faster. Consider making a quiet activity, such as reading, part of your nighttime routine. . Try to avoid taking naps during the day so that you sleep better at night. . Get out of bed if you are still awake after 15 minutes of trying to sleep. Keep the lights down, but try reading or doing a quiet activity. When you feel sleepy, go back to bed.  7. Vitamin D deficiency Low Vitamin D level contributes to fatigue and are associated with obesity, breast, and colon cancer. He agrees to continue to take OTC Vitamin D @5 ,000 IU daily and will follow-up for routine testing of Vitamin D, at least 2-3 times per year to avoid over-replacement.  8. Depression screening Depression screen was negative.  9. Class 1 obesity with serious comorbidity and body mass index (BMI) of 31.0 to 31.9 in adult, unspecified obesity type Digby is currently in the action stage of change and his goal is to continue with weight loss efforts. I recommend Ozzy begin the structured treatment plan as follows:  He has agreed to the Category 2 Plan.  Exercise goals: As is.   Behavioral modification strategies: increasing water intake.  He was informed of the importance of frequent follow-up visits to maximize his success with intensive lifestyle modifications for his multiple health conditions. He was informed we would discuss his lab results at his next visit unless there is a critical issue that needs to be addressed sooner. Westyn agreed to keep his next visit at the agreed upon time to discuss these results.  Objective:   Blood pressure (!) 146/62, pulse 78, temperature 98.2 F (36.8 C),  temperature source Oral, height 5\' 11"  (1.803 m), weight 223 lb (101.2 kg), SpO2 97 %. Body mass index is 31.1 kg/m.  EKG: Normal sinus rhythm, rate 87 bpm.  Indirect Calorimeter completed today shows a VO2 of 230 and a REE of 1601.  His calculated basal metabolic rate is 2671 thus his basal metabolic rate is worse than expected.  General: Cooperative, alert, well developed, in no acute distress. HEENT: Conjunctivae and lids unremarkable. Cardiovascular: Regular rhythm.  Lungs: Normal work of breathing. Neurologic: No focal deficits.   Lab Results  Component Value Date   CREATININE 1.07 10/11/2019   BUN 28 (H) 10/11/2019   NA 143 10/11/2019   K 3.9 10/11/2019   CL 109 10/11/2019   CO2 27 10/11/2019  Lab Results  Component Value Date   ALT 14 10/11/2019   AST 16 10/11/2019   ALKPHOS 59 10/11/2019   BILITOT 0.8 10/11/2019   Lab Results  Component Value Date   HGBA1C 6.0 08/07/2019   HGBA1C 6.1 (H) 02/05/2019   Lab Results  Component Value Date   INSULIN 7.6 10/18/2019   Lab Results  Component Value Date   TSH 1.465 10/11/2019   Lab Results  Component Value Date   CHOL 102 08/07/2019   HDL 32 (L) 08/07/2019   LDLCALC 53 08/07/2019   TRIG 83 08/07/2019   CHOLHDL 3.2 08/07/2019   Lab Results  Component Value Date   WBC 5.4 10/11/2019   HGB 17.0 10/11/2019   HCT 51.1 10/11/2019   MCV 91.1 10/11/2019   PLT 124 (L) 10/11/2019   Lab Results  Component Value Date   IRON 123 10/11/2019   TIBC 314 10/11/2019   FERRITIN 41 10/11/2019   Obesity Behavioral Intervention Visit Documentation for Insurance:   Approximately 15 minutes were spent on the discussion below.  ASK: We discussed the diagnosis of obesity with Juanda Crumble today and Nykeem agreed to give [place] permission to discuss obesity behavioral modification therapy today.  ASSESS: Kunta has the diagnosis of obesity and his BMI today is 31.2. Culley is in the action stage of change.   ADVISE: Jordanny  was educated on the multiple health risks of obesity as well as the benefit of weight loss to improve his health. He was advised of the need for long term treatment and the importance of lifestyle modifications to improve his current health and to decrease his risk of future health problems.  AGREE: Multiple dietary modification options and treatment options were discussed and Azrael agreed to follow the recommendations documented in the above note.  ARRANGE: Shanna was educated on the importance of frequent visits to treat obesity as outlined per CMS and USPSTF guidelines and agreed to schedule his next follow up appointment today.  Attestation Statements:   Reviewed by clinician on day of visit: allergies, medications, problem list, medical history, surgical history, family history, social history, and previous encounter notes.  I, Water quality scientist, CMA, am acting as Location manager for [employer], DO.  I have reviewed the above documentation for accuracy and completeness, and I agree with the above. Mellody Dance, DO

## 2019-10-19 LAB — INSULIN, RANDOM: INSULIN: 7.6 u[IU]/mL (ref 2.6–24.9)

## 2019-10-21 ENCOUNTER — Other Ambulatory Visit: Payer: Self-pay | Admitting: Family Medicine

## 2019-10-22 NOTE — Telephone Encounter (Signed)
Last rx'd 09/07/19 with 1 refill  Ok to refill?

## 2019-10-30 ENCOUNTER — Other Ambulatory Visit: Payer: Self-pay | Admitting: Family Medicine

## 2019-11-01 ENCOUNTER — Ambulatory Visit (INDEPENDENT_AMBULATORY_CARE_PROVIDER_SITE_OTHER): Payer: Medicare Other | Admitting: Family Medicine

## 2019-11-01 ENCOUNTER — Other Ambulatory Visit: Payer: Self-pay

## 2019-11-01 ENCOUNTER — Encounter (INDEPENDENT_AMBULATORY_CARE_PROVIDER_SITE_OTHER): Payer: Self-pay | Admitting: Family Medicine

## 2019-11-01 VITALS — BP 159/72 | HR 78 | Temp 98.3°F | Ht 71.0 in | Wt 224.0 lb

## 2019-11-01 DIAGNOSIS — R7303 Prediabetes: Secondary | ICD-10-CM | POA: Diagnosis not present

## 2019-11-01 DIAGNOSIS — E559 Vitamin D deficiency, unspecified: Secondary | ICD-10-CM

## 2019-11-01 DIAGNOSIS — I10 Essential (primary) hypertension: Secondary | ICD-10-CM | POA: Diagnosis not present

## 2019-11-01 DIAGNOSIS — E669 Obesity, unspecified: Secondary | ICD-10-CM | POA: Diagnosis not present

## 2019-11-01 DIAGNOSIS — E7849 Other hyperlipidemia: Secondary | ICD-10-CM | POA: Diagnosis not present

## 2019-11-01 DIAGNOSIS — Z6831 Body mass index (BMI) 31.0-31.9, adult: Secondary | ICD-10-CM

## 2019-11-01 MED ORDER — VITAMIN D (ERGOCALCIFEROL) 1.25 MG (50000 UNIT) PO CAPS: 50000.0000 [IU] | ORAL_CAPSULE | ORAL | 0 refills | Status: DC

## 2019-11-05 DIAGNOSIS — D692 Other nonthrombocytopenic purpura: Secondary | ICD-10-CM | POA: Diagnosis not present

## 2019-11-05 DIAGNOSIS — D225 Melanocytic nevi of trunk: Secondary | ICD-10-CM | POA: Diagnosis not present

## 2019-11-05 DIAGNOSIS — Z85828 Personal history of other malignant neoplasm of skin: Secondary | ICD-10-CM | POA: Diagnosis not present

## 2019-11-05 DIAGNOSIS — L821 Other seborrheic keratosis: Secondary | ICD-10-CM | POA: Diagnosis not present

## 2019-11-05 DIAGNOSIS — D0359 Melanoma in situ of other part of trunk: Secondary | ICD-10-CM | POA: Diagnosis not present

## 2019-11-05 DIAGNOSIS — L814 Other melanin hyperpigmentation: Secondary | ICD-10-CM | POA: Diagnosis not present

## 2019-11-05 DIAGNOSIS — D2261 Melanocytic nevi of right upper limb, including shoulder: Secondary | ICD-10-CM | POA: Diagnosis not present

## 2019-11-05 NOTE — Progress Notes (Signed)
Chief Complaint:   OBESITY Connor [name] is here to discuss his progress with his obesity treatment plan along with follow-up of his obesity related diagnoses. Connor [name] is on the Category 2 Plan and states he is following his eating plan approximately 50% of the time. Connor [name] states he is playing golf, doing lawn care, and lifting weights for 45 minutes 4-5 times per week.  Today's visit was #: 2 Starting weight: 223 lbs Starting date: 10/18/2019 Today's weight: 224 lbs Today's date: 11/01/2019 Total lbs lost to date: 0 Total lbs lost since last in-office visit: 0  Interim History: Connor [name] was at [facility] for 7-8 days recently and when he ate out, he says he made healthier choices such as steamed vegetables and grilled meats.  A couple of times he ate fried seafood.  The week before he left, for breakfast, he ate cereal and whole mild or eggs and bacon or oatmeal with bacon.  A typical lunch is [place] or chicken with mayo and Connor [name] bread.  He ate watermelon and cantaloupe as well. He has been drinking Gatorade (not sugar-free).  It also was his birthday recently, so he had cake.  Subjective:   1. Essential hypertension Review: taking medications as instructed, no medication side effects noted, no chest pain on exertion, no dyspnea on exertion, no swelling of ankles.  Connor [name] was asked to check his blood pressure at home and did not.  No symptoms of dizziness, headache, or swelling of legs/feet.  BP Readings from Last 3 Encounters:  11/01/19 (!) 159/72  10/18/19 (!) 146/62  10/11/19 (!) 157/68   2. Other hyperlipidemia Connor [name] has hyperlipidemia and has been trying to improve his cholesterol levels with intensive lifestyle modification including a low saturated fat diet, exercise and weight loss. He denies any chest pain, claudication or myalgias.  He is taking Crestor.  Lab Results  Component Value Date   ALT 14 10/11/2019   AST 16 10/11/2019   ALKPHOS 59 10/11/2019   BILITOT  0.8 10/11/2019   Lab Results  Component Value Date   CHOL 102 08/07/2019   HDL 32 (L) 08/07/2019   LDLCALC 53 08/07/2019   TRIG 83 08/07/2019   CHOLHDL 3.2 08/07/2019   3. Prediabetes Connor [name] has a diagnosis of prediabetes based on his elevated HgA1c and was informed this puts him at greater risk of developing diabetes. He continues to work on diet and exercise to decrease his risk of diabetes. He denies nausea or hypoglycemia.    Lab Results  Component Value Date   HGBA1C 6.0 08/07/2019   Lab Results  Component Value Date   INSULIN 7.6 10/18/2019   4. Vitamin D deficiency Connor [name] Vitamin D level was 68.2 on 08/07/2019. He is currently taking OTC vitamin D 5000 IU each day. He denies nausea, vomiting or muscle weakness.  Assessment/Plan:   1. Essential hypertension Discussed labs with patient today.  Connor [name] is working on healthy weight loss and exercise to improve blood pressure control. We will watch for signs of hypotension as he continues his lifestyle modifications.  Connor [name] was told that his blood pressure is not at goal.  He will check his blood pressure at home 23 days per week.  He will keep a log, and if above 150/90, he will call his PCP for possible changes in his medication regimen.  2. Other hyperlipidemia Discussed labs with patient today. Cardiovascular risk and specific lipid/LDL goals reviewed.  We discussed several lifestyle modifications today and Connor [name] will continue  to work on diet, exercise and weight loss efforts. Orders and follow up as documented in patient record.  Continue medication.  Counseling Intensive lifestyle modifications are the first line treatment for this issue. . Dietary changes: Increase soluble fiber. Decrease simple carbohydrates. . Exercise changes: Moderate to vigorous-intensity aerobic activity 150 minutes per week if tolerated. . Lipid-lowering medications: see documented in medical record.  3. Prediabetes Discussed labs with  patient today.  Connor [name] will continue to work on weight loss, exercise, and decreasing simple carbohydrates to help decrease the risk of diabetes.  Extensive education provided.  Continue prudent nutritional plan, weight loss, decrease simple carbs.  4. Vitamin D deficiency Discussed labs with patient today.  Low Vitamin D level contributes to fatigue and are associated with obesity, breast, and colon cancer. He agrees to start to take prescription Vitamin D @50 ,000 IU every week and will follow-up for routine testing of Vitamin D, at least 2-3 times per year to avoid over-replacement.  Discontinue OTC vitamin D. - Vitamin D, Ergocalciferol, (DRISDOL) 1.25 MG (50000 UNIT) CAPS capsule; Take 1 capsule (50,000 Units total) by mouth every 7 (seven) days.  Dispense: 4 capsule; Refill: 0  5. Class 1 obesity with serious comorbidity and body mass index (BMI) of 31.0 to 31.9 in adult, unspecified obesity type Connor [name] is currently in the action stage of change. As such, his goal is to continue with weight loss efforts. He has agreed to the Category 2 Plan.   Exercise goals: As is.  Behavioral modification strategies: increasing lean protein intake, decreasing simple carbohydrates, decreasing liquid calories, meal planning and cooking strategies and planning for success.  Connor [name] has agreed to follow-up with our clinic in 2 weeks. He was informed of the importance of frequent follow-up visits to maximize his success with intensive lifestyle modifications for his multiple health conditions.   Objective:   Blood pressure (!) 159/72, pulse 78, temperature 98.3 F (36.8 C), height 5\' 11"  (1.803 m), weight (!) 224 lb (101.6 kg), SpO2 95 %. Body mass index is 31.24 kg/m.  General: Cooperative, alert, well developed, in no acute distress. HEENT: Conjunctivae and lids unremarkable. Cardiovascular: Regular rhythm.  Lungs: Normal work of breathing. Neurologic: No focal deficits.   Lab Results  Component  Value Date   CREATININE 1.07 10/11/2019   BUN 28 (H) 10/11/2019   NA 143 10/11/2019   K 3.9 10/11/2019   CL 109 10/11/2019   CO2 27 10/11/2019   Lab Results  Component Value Date   ALT 14 10/11/2019   AST 16 10/11/2019   ALKPHOS 59 10/11/2019   BILITOT 0.8 10/11/2019   Lab Results  Component Value Date   HGBA1C 6.0 08/07/2019   HGBA1C 6.1 (H) 02/05/2019   Lab Results  Component Value Date   INSULIN 7.6 10/18/2019   Lab Results  Component Value Date   TSH 1.465 10/11/2019   Lab Results  Component Value Date   CHOL 102 08/07/2019   HDL 32 (L) 08/07/2019   LDLCALC 53 08/07/2019   TRIG 83 08/07/2019   CHOLHDL 3.2 08/07/2019   Lab Results  Component Value Date   WBC 5.4 10/11/2019   HGB 17.0 10/11/2019   HCT 51.1 10/11/2019   MCV 91.1 10/11/2019   PLT 124 (L) 10/11/2019   Lab Results  Component Value Date   IRON 123 10/11/2019   TIBC 314 10/11/2019   FERRITIN 41 10/11/2019   Attestation Statements:   Reviewed by clinician on day of visit: allergies, medications, problem list,  medical history, surgical history, family history, social history, and previous encounter notes.  Time spent on visit including pre-visit chart review and post-visit care and charting was 53 minutes.   I, Water quality scientist, CMA, am acting as Location manager for [employer], DO.  I have reviewed the above documentation for accuracy and completeness, and I agree with the above. Mellody Dance, DO

## 2019-11-21 ENCOUNTER — Ambulatory Visit (INDEPENDENT_AMBULATORY_CARE_PROVIDER_SITE_OTHER): Payer: Medicare Other | Admitting: Family Medicine

## 2019-11-21 ENCOUNTER — Encounter (INDEPENDENT_AMBULATORY_CARE_PROVIDER_SITE_OTHER): Payer: Self-pay | Admitting: Family Medicine

## 2019-11-21 ENCOUNTER — Other Ambulatory Visit: Payer: Self-pay

## 2019-11-21 VITALS — BP 123/72 | HR 74 | Temp 97.9°F | Ht 71.0 in | Wt 222.0 lb

## 2019-11-21 DIAGNOSIS — E669 Obesity, unspecified: Secondary | ICD-10-CM | POA: Diagnosis not present

## 2019-11-21 DIAGNOSIS — Z85828 Personal history of other malignant neoplasm of skin: Secondary | ICD-10-CM | POA: Diagnosis not present

## 2019-11-21 DIAGNOSIS — E559 Vitamin D deficiency, unspecified: Secondary | ICD-10-CM

## 2019-11-21 DIAGNOSIS — L988 Other specified disorders of the skin and subcutaneous tissue: Secondary | ICD-10-CM | POA: Diagnosis not present

## 2019-11-21 DIAGNOSIS — I10 Essential (primary) hypertension: Secondary | ICD-10-CM

## 2019-11-21 DIAGNOSIS — D0359 Melanoma in situ of other part of trunk: Secondary | ICD-10-CM | POA: Diagnosis not present

## 2019-11-21 DIAGNOSIS — Z6831 Body mass index (BMI) 31.0-31.9, adult: Secondary | ICD-10-CM

## 2019-11-22 ENCOUNTER — Encounter: Payer: Self-pay | Admitting: Hematology & Oncology

## 2019-11-22 ENCOUNTER — Telehealth: Payer: Self-pay | Admitting: Hematology & Oncology

## 2019-11-22 ENCOUNTER — Inpatient Hospital Stay: Payer: Medicare Other | Attending: Hematology & Oncology

## 2019-11-22 ENCOUNTER — Inpatient Hospital Stay (HOSPITAL_BASED_OUTPATIENT_CLINIC_OR_DEPARTMENT_OTHER): Payer: Medicare Other | Admitting: Hematology & Oncology

## 2019-11-22 ENCOUNTER — Inpatient Hospital Stay: Payer: Medicare Other

## 2019-11-22 VITALS — BP 149/76 | HR 74 | Temp 98.5°F | Resp 17 | Wt 223.0 lb

## 2019-11-22 DIAGNOSIS — D45 Polycythemia vera: Secondary | ICD-10-CM | POA: Diagnosis not present

## 2019-11-22 DIAGNOSIS — D045 Carcinoma in situ of skin of trunk: Secondary | ICD-10-CM | POA: Insufficient documentation

## 2019-11-22 DIAGNOSIS — Z7982 Long term (current) use of aspirin: Secondary | ICD-10-CM | POA: Diagnosis not present

## 2019-11-22 LAB — CBC WITH DIFFERENTIAL (CANCER CENTER ONLY)
Abs Immature Granulocytes: 0.02 10*3/uL (ref 0.00–0.07)
Basophils Absolute: 0 10*3/uL (ref 0.0–0.1)
Basophils Relative: 1 %
Eosinophils Absolute: 0.1 10*3/uL (ref 0.0–0.5)
Eosinophils Relative: 2 %
HCT: 49 % (ref 39.0–52.0)
Hemoglobin: 16.2 g/dL (ref 13.0–17.0)
Immature Granulocytes: 0 %
Lymphocytes Relative: 15 %
Lymphs Abs: 1 10*3/uL (ref 0.7–4.0)
MCH: 30.6 pg (ref 26.0–34.0)
MCHC: 33.1 g/dL (ref 30.0–36.0)
MCV: 92.5 fL (ref 80.0–100.0)
Monocytes Absolute: 0.3 10*3/uL (ref 0.1–1.0)
Monocytes Relative: 5 %
Neutro Abs: 5 10*3/uL (ref 1.7–7.7)
Neutrophils Relative %: 77 %
Platelet Count: 133 10*3/uL — ABNORMAL LOW (ref 150–400)
RBC: 5.3 MIL/uL (ref 4.22–5.81)
RDW: 12.4 % (ref 11.5–15.5)
WBC Count: 6.6 10*3/uL (ref 4.0–10.5)
nRBC: 0 % (ref 0.0–0.2)

## 2019-11-22 LAB — CMP (CANCER CENTER ONLY)
ALT: 14 U/L (ref 0–44)
AST: 17 U/L (ref 15–41)
Albumin: 4.7 g/dL (ref 3.5–5.0)
Alkaline Phosphatase: 49 U/L (ref 38–126)
Anion gap: 6 (ref 5–15)
BUN: 32 mg/dL — ABNORMAL HIGH (ref 8–23)
CO2: 29 mmol/L (ref 22–32)
Calcium: 9.7 mg/dL (ref 8.9–10.3)
Chloride: 107 mmol/L (ref 98–111)
Creatinine: 1.1 mg/dL (ref 0.61–1.24)
GFR, Est AFR Am: >60 mL/min (ref >60)
GFR, Estimated: >60 mL/min (ref >60)
Glucose, Bld: 106 mg/dL — ABNORMAL HIGH (ref 70–99)
Potassium: 4 mmol/L (ref 3.5–5.1)
Sodium: 142 mmol/L (ref 135–145)
Total Bilirubin: 0.9 mg/dL (ref 0.3–1.2)
Total Protein: 6.6 g/dL (ref 6.5–8.1)

## 2019-11-22 LAB — IRON AND TIBC
Iron: 119 ug/dL (ref 42–163)
Saturation Ratios: 35 % (ref 20–55)
TIBC: 344 ug/dL (ref 202–409)
UIBC: 225 ug/dL (ref 117–376)

## 2019-11-22 LAB — FERRITIN: Ferritin: 26 ng/mL (ref 24–336)

## 2019-11-22 NOTE — Progress Notes (Signed)
Hematology and Oncology Follow Up Visit  Connor [name] [id number] 01-Mar-1946 74 y.o. 11/22/2019   Principle Diagnosis:   Polycythemia vera-Triple negative  Chronic immune thrombocytopenia vs medication  Transient iron deficiency secondary to phlebotomies  Current Therapy:    Phlebotomy to maintain hematocrit below 45% -- patient goes to the [facility]  Aspirin 81 mg by mouth twice daily   IV iron as indicated - last dose given on 06/02/2016     Interim History:  [name] is back for followup. He is doing a great job with respect to his blood counts. He is donating blood to the [facility]. There is another blood draw if I discharge coming up that he will donate.  He had melanoma in situ removed from his abdominal wall. This was to the right of the abdominal wall. So far, he has not been told of any invasive disease. He did have a wider excision for this. He had this actually done yesterday.  There is been no problems with nausea or vomiting. He has had no headache. He has had no leg swelling. He has had no change in bowel or bladder habits.  He has had some arthritic issues with his knees.      Overall, his performance status is ECOG 0.   Medications:  Current Outpatient Medications:    alfuzosin (UROXATRAL) 10 MG 24 hr tablet, Take 10 mg by mouth every morning. , Disp: , Rfl:    Ascorbic Acid (VITAMIN C) 100 MG tablet, Take 100 mg by mouth daily., Disp: , Rfl:    aspirin 81 MG tablet, Take 81 mg by mouth 2 (two) times daily. , Disp: , Rfl:    Black Elderberry 50 MG/5ML SYRP, Take by mouth daily. 2 tablespoons., Disp: , Rfl:    cetirizine (ZYRTEC) 10 MG tablet, Take 10 mg by mouth daily., Disp: , Rfl:    Coenzyme Q10 (CO Q 10 PO), Take 1 capsule by mouth daily. , Disp: , Rfl:    cyclobenzaprine (FLEXERIL) 10 MG tablet, TAKE ONE TABLET BY MOUTH THREE TIMES DAILY AS NEEDED FOR MUSCLE SPASMS, Disp: 30 tablet, Rfl: 1   Esomeprazole Magnesium (NEXIUM 24HR) 20 MG TBEC,  Nexium 24HR 20 mg tablet,delayed release, Disp: , Rfl:    ferrous sulfate 325 (65 FE) MG tablet, Take 1 tablet (325 mg total) by mouth daily with breakfast., Disp: 90 tablet, Rfl: 3   Glucosamine-Chondroit-Vit C-Mn (GLUCOSAMINE CHONDR 1500 COMPLX) CAPS, Take 1 capsule by mouth daily. , Disp: , Rfl:    Ipratropium-Albuterol (COMBIVENT) 20-100 MCG/ACT AERS respimat, Inhale 1 puff into the lungs as needed for wheezing or shortness of breath., Disp: 3 Inhaler, Rfl: 3   losartan (COZAAR) 50 MG tablet, Take 1 tablet (50 mg total) by mouth daily. (Patient taking differently: Take 25 mg by mouth daily. ), Disp: 90 tablet, Rfl: 3   montelukast (SINGULAIR) 10 MG tablet, TAKE ONE TABLET BY MOUTH AT BEDTIME, Disp: 90 tablet, Rfl: 1   Multiple Vitamins-Minerals (CENTRUM SILVER ULTRA MENS) TABS, Take 1 tablet by mouth every morning. Once a day, Disp: , Rfl:    Probiotic Product (Union), Take 1 capsule by mouth daily. , Disp: , Rfl:    pyridOXINE (VITAMIN B-6) 100 MG tablet, Take 100 mg by mouth daily. , Disp: , Rfl:    rosuvastatin (CRESTOR) 10 MG tablet, TAKE ONE TABLET BY MOUTH DAILY, Disp: 90 tablet, Rfl: 1   sildenafil (REVATIO) 20 MG tablet, Take 2-5 tabs prior  to sexual activity (Patient taking differently: Take 20 mg by mouth 3 (three) times daily as needed (For sexual activity). ), Disp: 50 tablet, Rfl: 1   Turmeric 500 MG CAPS, Take 1 tablet by mouth daily. , Disp: , Rfl:    vitamin B-12 (CYANOCOBALAMIN) 500 MCG tablet, Take 500 mcg by mouth daily., Disp: , Rfl:    Vitamin D, Ergocalciferol, (DRISDOL) 1.25 MG (50000 UNIT) CAPS capsule, Take 1 capsule (50,000 Units total) by mouth every 7 (seven) days., Disp: 4 capsule, Rfl: 0   Zinc 50 MG TABS, Take by mouth daily., Disp: , Rfl:   Allergies:  Allergies  Allergen Reactions   Dexlansoprazole Nausea And Vomiting   Celecoxib Other (See Comments)    Solid white - generic pill only = caused diarrhea (other generics ok)   Solid white - generic pill only = caused diarrhea (other generics ok)     Past Medical History, Surgical history, Social history, and Family History were reviewed and updated.  Review of Systems: Review of Systems  Constitutional: Negative.   HENT: Negative.   Eyes: Negative.   Respiratory: Negative.   Cardiovascular: Negative.   Gastrointestinal: Negative.   Genitourinary: Negative.   Musculoskeletal: Negative.   Skin: Negative.   Neurological: Negative.   Endo/Heme/Allergies: Negative.   Psychiatric/Behavioral: Negative.  Negative for depression.     Physical Exam:  weight is 223 lb (101.2 kg). His oral temperature is 98.5 F (36.9 C). His blood pressure is 149/76 (abnormal) and his pulse is 74. His respiration is 17 and oxygen saturation is 99%.  Saturday and Sunday in [place] [facility] and on he was in [place] over the next couple weeks I Connor [name].  [place]  Physical Exam Vitals reviewed.  HENT:     Head: Normocephalic and atraumatic.  Eyes:     Pupils: Pupils are equal, round, and reactive to light.  Cardiovascular:     Rate and Rhythm: Normal rate and regular rhythm.     Heart sounds: Normal heart sounds.  Pulmonary:     Effort: Pulmonary effort is normal.     Breath sounds: Normal breath sounds.  Abdominal:     General: Bowel sounds are normal.     Palpations: Abdomen is soft.  Musculoskeletal:        General: No tenderness or deformity. Normal range of motion.     Cervical back: Normal range of motion.  Lymphadenopathy:     Cervical: No cervical adenopathy.  Skin:    General: Skin is warm and dry.     Findings: No erythema or rash.  Neurological:     Mental Status: He is alert and oriented to person, place, and time.  Psychiatric:        Behavior: Behavior normal.        Thought Content: Thought content normal.        Judgment: Judgment normal.     Lab Results  Component Value Date   WBC 6.6 11/22/2019   HGB 16.2 11/22/2019   HCT  49.0 11/22/2019   MCV 92.5 11/22/2019   PLT 133 (L) 11/22/2019     Chemistry      Component Value Date/Time   NA 142 11/22/2019 0924   NA 145 (H) 02/05/2019 0930   NA 139 04/13/2017 1150   NA 141 07/09/2016 1038   K 4.0 11/22/2019 0924   K 4.0 04/13/2017 1150   K 4.0 07/09/2016 1038   CL 107 11/22/2019 0924   CL 104  04/13/2017 1150   CO2 29 11/22/2019 0924   CO2 26 04/13/2017 1150   CO2 22 07/09/2016 1038   BUN 32 (H) 11/22/2019 0924   BUN 18 02/05/2019 0930   BUN 17 04/13/2017 1150   BUN 19.0 07/09/2016 1038   CREATININE 1.10 11/22/2019 0924   CREATININE 1.0 04/13/2017 1150   CREATININE 0.9 07/09/2016 1038      Component Value Date/Time   CALCIUM 9.7 11/22/2019 0924   CALCIUM 9.4 04/13/2017 1150   CALCIUM 9.5 07/09/2016 1038   ALKPHOS 49 11/22/2019 0924   ALKPHOS 64 04/13/2017 1150   ALKPHOS 85 07/09/2016 1038   AST 17 11/22/2019 0924   AST 22 07/09/2016 1038   ALT 14 11/22/2019 0924   ALT 26 04/13/2017 1150   ALT 22 07/09/2016 1038   BILITOT 0.9 11/22/2019 0924   BILITOT 0.62 07/09/2016 1038      Impression and Plan: Mr. Pardini is a 74 year old gentleman with polycythemia. He goes to the [facility] today for another donation.  This I think is very very helpful.  We will plan to get him back in another 8 weeks.  We will see how his blood count looks and possibly phlebotomize him.  He will continue on his baby aspirin.    Volanda Napoleon, MD 8/12/202110:49 AM

## 2019-11-22 NOTE — Progress Notes (Signed)
Chief Complaint:   OBESITY Connor [name] is here to discuss his progress with his obesity treatment plan along with follow-up of his obesity related diagnoses. Square is on the Category 2 Plan and states he is following his eating plan approximately 80% of the time. Lyfe states he is playing golf, lifting weights, and stretching for 15-20 minutes 7 times per week.  Today's visit was #: 3 Starting weight: 223 lbs Starting date: 10/18/2019 Today's weight: 222 lbs Today's date: 11/21/2019 Total lbs lost to date: 1 lb Total lbs lost since last in-office visit: 2 lbs  Interim History: Connor [name] says he did better this time than last in terms of following the plan.  He says he is enjoying it more and eating on plan 75% of the time.  He recently had excision of a melanoma skin lesion from his right anterior abdomen.  He says, "they took a large chunk out today."  No issues or concerns regarding meal plan.  Hunger and cravings are controlled.  Subjective:   1. Essential hypertension Review: taking medications as instructed, no medication side effects noted, no chest pain on exertion, no dyspnea on exertion, no swelling of ankles.  Much improved since prior office visit.  Denies concerns or symptoms.  BP Readings from Last 3 Encounters:  11/21/19 123/72  11/01/19 (!) 159/72  10/18/19 (!) 146/62   2. Vitamin D deficiency Connor [name]'s Vitamin D level was 68.2 on 08/07/2019. He is currently taking prescription vitamin D 50,000 IU each week. He denies nausea, vomiting or muscle weakness.  Assessment/Plan:   1. Essential hypertension Hawkin is working on healthy weight loss and exercise to improve blood pressure control. We will watch for signs of hypotension as he continues his lifestyle modifications.  Continue medications per PCP/specialist.  Continue prudent nutritional plan, weight loss, decrease salt.  2. Vitamin D deficiency Low Vitamin D level contributes to fatigue and are associated with  obesity, breast, and colon cancer. He agrees to continue to take prescription Vitamin D @50 ,000 IU every week and will follow-up for routine testing of Vitamin D, at least 2-3 times per year to avoid over-replacement. - Vitamin D, Ergocalciferol, (DRISDOL) 1.25 MG (50000 UNIT) CAPS capsule; Take 1 capsule (50,000 Units total) by mouth every 7 (seven) days.  Dispense: 4 capsule; Refill: 0  3. Class 1 obesity with serious comorbidity and body mass index (BMI) of 31.0 to 31.9 in adult, unspecified obesity type Connor [name] is currently in the action stage of change. As such, his goal is to continue with weight loss efforts. He has agreed to the Category 2 Plan.   Exercise goals: As is.  Behavioral modification strategies: increasing lean protein intake, decreasing eating out, meal planning and cooking strategies and planning for success, eating out strategies.  Mister has agreed to follow-up with our clinic in 2 weeks. He was informed of the importance of frequent follow-up visits to maximize his success with intensive lifestyle modifications for his multiple health conditions.   Objective:   Blood pressure 123/72, pulse 74, temperature 97.9 F (36.6 C), height 5\' 11"  (1.803 m), weight 222 lb (100.7 kg), SpO2 97 %. Body mass index is 30.96 kg/m.  General: Cooperative, alert, well developed, in no acute distress. HEENT: Conjunctivae and lids unremarkable. Cardiovascular: Regular rhythm.  Lungs: Normal work of breathing. Neurologic: No focal deficits.   Lab Results  Component Value Date   CREATININE 1.07 10/11/2019   BUN 28 (H) 10/11/2019   NA 143 10/11/2019   K 3.9  10/11/2019   CL 109 10/11/2019   CO2 27 10/11/2019   Lab Results  Component Value Date   ALT 14 10/11/2019   AST 16 10/11/2019   ALKPHOS 59 10/11/2019   BILITOT 0.8 10/11/2019   Lab Results  Component Value Date   HGBA1C 6.0 08/07/2019   HGBA1C 6.1 (H) 02/05/2019   Lab Results  Component Value Date   INSULIN 7.6  10/18/2019   Lab Results  Component Value Date   TSH 1.465 10/11/2019   Lab Results  Component Value Date   CHOL 102 08/07/2019   HDL 32 (L) 08/07/2019   LDLCALC 53 08/07/2019   TRIG 83 08/07/2019   CHOLHDL 3.2 08/07/2019   Lab Results  Component Value Date   WBC 5.4 10/11/2019   HGB 17.0 10/11/2019   HCT 51.1 10/11/2019   MCV 91.1 10/11/2019   PLT 124 (L) 10/11/2019   Lab Results  Component Value Date   IRON 123 10/11/2019   TIBC 314 10/11/2019   FERRITIN 41 10/11/2019   Obesity Behavioral Intervention Documentation for Insurance:   Approximately 15 minutes were spent on the discussion below.  ASK: We discussed the diagnosis of obesity with Juanda Crumble today and Alee agreed to give [place] permission to discuss obesity behavioral modification therapy today.  ASSESS: Izaha has the diagnosis of obesity and his BMI today is 31.0. Emmet is in the action stage of change.   ADVISE: Tabb was educated on the multiple health risks of obesity as well as the benefit of weight loss to improve his health. He was advised of the need for long term treatment and the importance of lifestyle modifications to improve his current health and to decrease his risk of future health problems.  AGREE: Multiple dietary modification options and treatment options were discussed and Jerik agreed to follow the recommendations documented in the above note.  ARRANGE: Rees was educated on the importance of frequent visits to treat obesity as outlined per CMS and USPSTF guidelines and agreed to schedule his next follow up appointment today.  Attestation Statements:   Reviewed by clinician on day of visit: allergies, medications, problem list, medical history, surgical history, family history, social history, and previous encounter notes.  I, Water quality scientist, CMA, am acting as Location manager for [employer], DO.  I have reviewed the above documentation for accuracy and completeness, and I  agree with the above. Mellody Dance, DO

## 2019-11-22 NOTE — Telephone Encounter (Signed)
Appointments scheduled calendar printed per 8/12 los °

## 2019-11-23 ENCOUNTER — Other Ambulatory Visit: Payer: Self-pay

## 2019-11-23 ENCOUNTER — Inpatient Hospital Stay: Payer: Medicare Other

## 2019-11-23 VITALS — BP 121/64 | HR 73 | Temp 98.8°F | Resp 18

## 2019-11-23 DIAGNOSIS — Z7982 Long term (current) use of aspirin: Secondary | ICD-10-CM | POA: Diagnosis not present

## 2019-11-23 DIAGNOSIS — D508 Other iron deficiency anemias: Secondary | ICD-10-CM

## 2019-11-23 DIAGNOSIS — K909 Intestinal malabsorption, unspecified: Secondary | ICD-10-CM

## 2019-11-23 DIAGNOSIS — D45 Polycythemia vera: Secondary | ICD-10-CM | POA: Diagnosis not present

## 2019-11-23 DIAGNOSIS — D045 Carcinoma in situ of skin of trunk: Secondary | ICD-10-CM | POA: Diagnosis not present

## 2019-11-23 MED ORDER — SODIUM CHLORIDE 0.9 % IV SOLN
Freq: Once | INTRAVENOUS | Status: DC
  Filled 2019-11-23: qty 250

## 2019-11-23 NOTE — Patient Instructions (Signed)
Therapeutic Phlebotomy Therapeutic phlebotomy is the planned removal of blood from a person's body for the purpose of treating a medical condition. The procedure is similar to donating blood. Usually, about a pint (470 mL, or 0.47 L) of blood is removed. The average adult has 9-12 pints (4.3-5.7 L) of blood in the body. Therapeutic phlebotomy may be used to treat the following medical conditions:  Hemochromatosis. This is a condition in which the blood contains too much iron.  Polycythemia vera. This is a condition in which the blood contains too many red blood cells.  Porphyria cutanea tarda. This is a disease in which an important part of hemoglobin is not made properly. It results in the buildup of abnormal amounts of porphyrins in the body.  Sickle cell disease. This is a condition in which the red blood cells form an abnormal crescent shape rather than a round shape. Tell a health care provider about:  Any allergies you have.  All medicines you are taking, including vitamins, herbs, eye drops, creams, and over-the-counter medicines.  Any problems you or family members have had with anesthetic medicines.  Any blood disorders you have.  Any surgeries you have had.  Any medical conditions you have.  Whether you are pregnant or may be pregnant. What are the risks? Generally, this is a safe procedure. However, problems may occur, including:  Nausea or light-headedness.  Low blood pressure (hypotension).  Soreness, bleeding, swelling, or bruising at the needle insertion site.  Infection. What happens before the procedure?  Follow instructions from your health care provider about eating or drinking restrictions.  Ask your health care provider about: ? Changing or stopping your regular medicines. This is especially important if you are taking diabetes medicines or blood thinners (anticoagulants). ? Taking medicines such as aspirin and ibuprofen. These medicines can thin your  blood. Do not take these medicines unless your health care provider tells you to take them. ? Taking over-the-counter medicines, vitamins, herbs, and supplements.  Wear clothing with sleeves that can be raised above the elbow.  Plan to have someone take you home from the hospital or clinic.  You may have a blood sample taken.  Your blood pressure, pulse rate, and breathing rate will be measured. What happens during the procedure?   To lower your risk of infection: ? Your health care team will wash or sanitize their hands. ? Your skin will be cleaned with an antiseptic.  You may be given a medicine to numb the area (local anesthetic).  A tourniquet will be placed on your arm.  A needle will be inserted into one of your veins.  Tubing and a collection bag will be attached to that needle.  Blood will flow through the needle and tubing into the collection bag.  The collection bag will be placed lower than your arm to allow gravity to help the flow of blood into the bag.  You may be asked to open and close your hand slowly and continually during the entire collection.  After the specified amount of blood has been removed from your body, the collection bag and tubing will be clamped.  The needle will be removed from your vein.  Pressure will be held on the site of the needle insertion to stop the bleeding.  A bandage (dressing) will be placed over the needle insertion site. The procedure may vary among health care providers and hospitals. What happens after the procedure?  Your blood pressure, pulse rate, and breathing rate will be   measured after the procedure.  You will be encouraged to drink fluids.  Your recovery will be assessed and monitored.  You can return to your normal activities as told by your health care provider. Summary  Therapeutic phlebotomy is the planned removal of blood from a person's body for the purpose of treating a medical condition.  Therapeutic  phlebotomy may be used to treat hemochromatosis, polycythemia vera, porphyria cutanea tarda, or sickle cell disease.  In the procedure, a needle is inserted and about a pint (470 mL, or 0.47 L) of blood is removed. The average adult has 9-12 pints (4.3-5.7 L) of blood in the body.  This is generally a safe procedure, but it can sometimes cause problems such as nausea, light-headedness, or low blood pressure (hypotension). This information is not intended to replace advice given to you by your health care provider. Make sure you discuss any questions you have with your health care provider. Document Revised: 04/14/2017 Document Reviewed: 04/14/2017 Elsevier Patient Education  2020 [employer]  

## 2019-11-23 NOTE — Progress Notes (Signed)
Modesta Messing presents today for phlebotomy per MD orders. Phlebotomy procedure started at 0355 via 20 g angiocath in right antecubital and ended at 1415. 511 grams removed. Patient observed for 30 minutes after procedure without any incident. Patient tolerated procedure well. IV needle removed intact.

## 2019-11-26 MED ORDER — VITAMIN D (ERGOCALCIFEROL) 1.25 MG (50000 UNIT) PO CAPS: 50000.0000 [IU] | ORAL_CAPSULE | ORAL | 0 refills | Status: DC

## 2019-12-03 ENCOUNTER — Ambulatory Visit: Payer: Medicare Other | Admitting: Cardiovascular Disease

## 2019-12-06 ENCOUNTER — Encounter: Payer: Self-pay | Admitting: Cardiovascular Disease

## 2019-12-06 ENCOUNTER — Ambulatory Visit (INDEPENDENT_AMBULATORY_CARE_PROVIDER_SITE_OTHER): Payer: Medicare Other | Admitting: Cardiovascular Disease

## 2019-12-06 ENCOUNTER — Other Ambulatory Visit: Payer: Self-pay

## 2019-12-06 VITALS — BP 124/66 | HR 66 | Temp 96.9°F | Ht 71.5 in | Wt 225.0 lb

## 2019-12-06 DIAGNOSIS — J449 Chronic obstructive pulmonary disease, unspecified: Secondary | ICD-10-CM

## 2019-12-06 DIAGNOSIS — E669 Obesity, unspecified: Secondary | ICD-10-CM

## 2019-12-06 DIAGNOSIS — E785 Hyperlipidemia, unspecified: Secondary | ICD-10-CM

## 2019-12-06 DIAGNOSIS — G4733 Obstructive sleep apnea (adult) (pediatric): Secondary | ICD-10-CM | POA: Diagnosis not present

## 2019-12-06 DIAGNOSIS — I10 Essential (primary) hypertension: Secondary | ICD-10-CM

## 2019-12-06 NOTE — Progress Notes (Signed)
Patient ID: Connor [name], male   DOB: 07/02/1945, 74 y.o.   MRN: [id number]    HPI: Connor [name] is a 74 y.o. male who presents to the office for a 62- month cardiology evaluation.   Connor [name] has a history of significant hyperlipidemia and remotely had been on combination therapy with Niaspan for some time and had taken Zetia as well as Crestor.  He has a strong family history for coronary artery disease and 2 of his younger brothers have had previous  coronary artery stenting. He  has a history of GERD and is status post laparoscopic Nissen fundoplication by [name] Done due to hiatal hernia severe GERD. An echo Doppler study in November 2013 showed an ejection fraction > 55% with grade 1 diastolic dysfunction. He had mild left atrial enlargement and evidence for mild aortic valve sclerosis. He underwent carotid duplex imaging which was normal. A nuclear perfusion study showed normal perfusion and function.  When I saw him 3 years ago he had microcytic indices and was found to be iron deficient. He also subsequently developed thrombocytopenia and has been under the care of [name].  He has been diagnosed with polycythemia vera-JAK2 negative and chronic immune thrombocytopenia.  He apparently did receive IV iron therapy, and underwent ultrasound of the spleen as well as bone marrow. He also has undergone colonoscopy.   He is on CPAP therapy for obstructive sleep apnea.  A follow-up echo Doppler study on 02/11/2014 showed an ejection fraction at 50-55% without regional wall motion abnormalities, but with continued grade 1 diastolic dysfunction.  The left atrium was mildly dilated.   I last saw him in March 2019.  Since his prior evaluation in January 2018 he remained stable since from a cardiac standpoint.  Specifically, he denies chest pain, PND, orthopnea.  He is on losartan 50 mg for hypertension. He continues to be on rosuvastatin 10 mg and omega-3 fatty acids for his mixed hyperlipidemia.  He  is on Combivent and Singulair for mild COPD.  He has polycythemia vera and is JAK2 negative and chronic immune thrombocytopenia for which she is followed by [name].  He  saw [name] with complaints of some mild nondescript nausea. A complete set of laboratory was done 10 days ago, which were excellent.  Essentially, LDL cholesterol was 48 with total cholesterol 93.  HDL was 32.  He had normal LFTs.  CBC was normal.  Platelets are 150,000.  He denies palpitations.  He continues to use CPAP with 100% compliance.  He is unaware of breakthrough snoring.  His sleep is restorative.   Over the last 2 years, he has remained stable.  He had a neurologic episode and after extensive evaluation was felt to have transient global amnesia which ultimately resolved.  He underwent a CT, MRI, EEG without significant abnormalities.  He is followed by Dr. Warrick Parisian at [facility] family medicine since Dr. Tawanna Sat retirement.  2 weeks ago he had a melanoma resected from his abdomen and was told that this was stage 0.  He denies chest pain or shortness of breath.  He is on losartan 25 mg daily for hypertension and rosuvastatin 10 mg for hyperlipidemia.  He has COPD on Singulair.  He admits to continued CPAP use with excellent compliance and this is followed at the [place].  He presents for evaluation.   Past Medical History:  Diagnosis Date  . Anemia   . Arthritis   . Back pain   . BPH (  benign prostatic hyperplasia)   . Cancer (HCC)    squamous cell on nose , inside nose   . Cardiomegaly   . Carotid artery stenosis   . COPD, mild (HCC) 05/26/2007   PFT 09/09/11>>FEV1 3.06 (95%), FEV1% 69, FEF 25-75% 1.70 (59%), TLC 7.67 (109%), DLCO 108%, no BD    . Cough 05/26/2007  . Edema, lower extremity   . Erectile dysfunction   . GERD (gastroesophageal reflux disease)   . H/O vitamin D deficiency   . History of nuclear stress test    this revealed normal myocardial perfusion and function. Post stress EF was 57%. There  was no region of scar or ischemia.  [name] Hx of echocardiogram 02/15/2012   This showed mild concentric LVH. EF > 55%. H edid have grade 1 diastolic dysfunction with mitral valve E:A ratio of 0.81, his left atrium was mildly dilated by volume assessment at 33.3 mL/m2. He did have mild tricupid regurgitation with upper normal RV systolic pressure at 20mm. He had aortic valve sclerosis without stenosis.  . Hyperlipemia   . Hypertension   . Iron deficiency anemia, unspecified 09/01/2012  . Iron malabsorption 05/28/2016  . Joint pain   . Nephrolithiasis    stones   . Peyronie's disease   . Polycythemia vera(238.4) 04/19/2013  . Shortness of breath    with exertion   . Sleep apnea    Cpap-   . Thrombocytopenia (HCC)     Past Surgical History:  Procedure Laterality Date  . APPENDECTOMY    . DG THUMB LEFT HAND  03/2019  . eye lid surgery  2015  . EYE SURGERY Bilateral    eye lid surgery  . HIATAL HERNIA REPAIR  11/24/2011   Procedure: LAPAROSCOPIC REPAIR OF HIATAL HERNIA;  Surgeon: Valarie Merino, MD;  Location: [facility];  Service: General;;  . KNEE SURGERY     right  . LAPAROSCOPIC NISSEN FUNDOPLICATION  11/24/2011   Procedure: LAPAROSCOPIC NISSEN FUNDOPLICATION;  Surgeon: Valarie Merino, MD;  Location: [facility];  Service: General;  Laterality: N/A;  . NOSE SURGERY  2013  . SHOULDER SURGERY     left    Allergies  Allergen Reactions  . Dexlansoprazole Nausea And Vomiting  . Celecoxib Other (See Comments)    Solid white - generic pill only = caused diarrhea (other generics ok)  Solid white - generic pill only = caused diarrhea (other generics ok)     Current Outpatient Medications  Medication Sig Dispense Refill  . alfuzosin (UROXATRAL) 10 MG 24 hr tablet Take 10 mg by mouth every morning.     . Ascorbic Acid (VITAMIN C) 100 MG tablet Take 100 mg by mouth daily.    [name] aspirin 81 MG tablet Take 81 mg by mouth 2 (two) times daily.     . Black Elderberry 50 MG/5ML SYRP Take by mouth  daily. 2 tablespoons.    . cetirizine (ZYRTEC) 10 MG tablet Take 10 mg by mouth daily.    . Coenzyme Q10 (CO Q 10 PO) Take 1 capsule by mouth daily.     . cyclobenzaprine (FLEXERIL) 10 MG tablet TAKE ONE TABLET BY MOUTH THREE TIMES DAILY AS NEEDED FOR MUSCLE SPASMS 30 tablet 1  . Esomeprazole Magnesium (NEXIUM 24HR) 20 MG TBEC Nexium 24HR 20 mg tablet,delayed release    . ferrous sulfate 325 (65 FE) MG tablet Take 1 tablet (325 mg total) by mouth daily with breakfast. 90 tablet 3  . Glucosamine-Chondroit-Vit C-Mn (GLUCOSAMINE CHONDR 1500  COMPLX) CAPS Take 1 capsule by mouth daily.     . Ipratropium-Albuterol (COMBIVENT) 20-100 MCG/ACT AERS respimat Inhale 1 puff into the lungs as needed for wheezing or shortness of breath. 3 Inhaler 3  . losartan (COZAAR) 50 MG tablet Take 1 tablet (50 mg total) by mouth daily. (Patient taking differently: Take 25 mg by mouth daily. ) 90 tablet 3  . montelukast (SINGULAIR) 10 MG tablet TAKE ONE TABLET BY MOUTH AT BEDTIME 90 tablet 1  . Multiple Vitamins-Minerals (CENTRUM SILVER ULTRA MENS) TABS Take 1 tablet by mouth every morning. Once a day    . Probiotic Product (PHILLIPS COLON HEALTH PO) Take 1 capsule by mouth daily.     [name] pyridOXINE (VITAMIN B-6) 100 MG tablet Take 100 mg by mouth daily.     . rosuvastatin (CRESTOR) 10 MG tablet TAKE ONE TABLET BY MOUTH DAILY 90 tablet 1  . sildenafil (REVATIO) 20 MG tablet Take 2-5 tabs prior to sexual activity (Patient taking differently: Take 20 mg by mouth 3 (three) times daily as needed (For sexual activity). ) 50 tablet 1  . Turmeric 500 MG CAPS Take 1 tablet by mouth daily.     . vitamin B-12 (CYANOCOBALAMIN) 500 MCG tablet Take 500 mcg by mouth daily.    . Vitamin D, Ergocalciferol, (DRISDOL) 1.25 MG (50000 UNIT) CAPS capsule Take 1 capsule (50,000 Units total) by mouth every 7 (seven) days. 4 capsule 0  . Zinc 50 MG TABS Take by mouth daily.     No current facility-administered medications for this visit.     Social History   Socioeconomic History  . Marital status: Married    Spouse name: Connor [name]   . Number of children: 3  . Years of education: 48  . Highest education level: 12th grade  Occupational History  . Occupation: retired    Fish farm manager: RETIRED    Comment: [employer]  Tobacco Use  . Smoking status: Former Smoker    Packs/day: 0.25    Years: 11.00    Pack years: 2.75    Types: Cigarettes    Start date: 05/17/1958    Quit date: 04/12/1966    Years since quitting: 53.6  . Smokeless tobacco: Never Used  . Tobacco comment: quit smoking in 1968  Vaping Use  . Vaping Use: Never used  Substance and Sexual Activity  . Alcohol use: Yes    Alcohol/week: 0.0 standard drinks    Comment: seldom  . Drug use: No  . Sexual activity: Not Currently  Other Topics Concern  . Not on file  Social History Narrative   Right handed    Live with wife   Social Determinants of Health   Financial Resource Strain: Low Risk   . Difficulty of Paying Living Expenses: Not hard at all  Food Insecurity: No Food Insecurity  . Worried About Charity fundraiser in the Last Year: Never true  . Ran Out of Food in the Last Year: Never true  Transportation Needs: No Transportation Needs  . Lack of Transportation (Medical): No  . Lack of Transportation (Non-Medical): No  Physical Activity: Sufficiently Active  . Days of Exercise per Week: 5 days  . Minutes of Exercise per Session: 30 min  Stress: No Stress Concern Present  . Feeling of Stress : Not at all  Social Connections: Socially Integrated  . Frequency of Communication with Friends and Family: More than three times a week  . Frequency of Social Gatherings with Friends and Family:  More than three times a week  . Attends Religious Services: More than 4 times per year  . Active Member of Clubs or Organizations: Yes  . Attends Archivist Meetings: More than 4 times per year  . Marital Status: Married  Human resources officer Violence:  Not At Risk  . Fear of Current or Ex-Partner: No  . Emotionally Abused: No  . Physically Abused: No  . Sexually Abused: No    Family History  Problem Relation Age of Onset  . Heart disease Mother   . Diabetes Mother   . Hypertension Mother   . Obesity Mother   . Heart disease Father   . Hypertension Father   . Alcoholism Father   . Obesity Father   . Heart disease Sister   . Arthritis Sister   . Diabetes Brother   . Hypertension Brother   . Heart attack Brother        enlarged heart  . Liver cancer Paternal Grandfather   . Throat cancer Maternal Grandfather   . Hemachromatosis Daughter   . Arthritis Daughter   . GI problems Daughter   . Thyroid disease Daughter   . Hypertension Daughter   . Heart disease Paternal Grandmother   . Thyroid disease Daughter        pituitary tumor  . Migraines Daughter   . Colon cancer Neg Hx   . Esophageal cancer Neg Hx   . Rectal cancer Neg Hx   . Stomach cancer Neg Hx    Social history is notable in that he is married has 3 children 5 grandchildren and 3 stepgrandchildren. He is retired. No tobacco history. He does drink rare wine. He exercises several days per week for 30-45 minutes.    ROS General: Negative; No fevers, chills, or night sweats;  HEENT: No changes in vision or hearing, sinus congestion, difficulty swallowing Pulmonary: Positive for mild COPD Cardiovascular: Negative; No chest pain, presyncope, syncope, palpitations GI: Positive for GERD; No nausea, vomiting, diarrhea, or abdominal pain GU: Negative; No dysuria, hematuria, or difficulty voiding Musculoskeletal: Negative; no myalgias, joint pain, or weakness Hematologic/Oncology: Positive for polycythemia vera for which he undergoes phlebotomy to keep his hematocrit less than 45; chronic immune thrombocytopenia; no easy bruising, bleeding Endocrine: Negative; no heat/cold intolerance; no diabetes Neuro: Negative; no changes in balance, headaches Skin: Melanoma  resection from abdomen Psychiatric: Negative; No behavioral problems, depression Sleep: Positive for obstructive sleep apnea on CPAP therapy No snoring, daytime sleepiness, hypersomnolence, bruxism, restless legs, hypnogognic hallucinations, no cataplexy Other comprehensive 14 point system review is negative.   PE BP 124/66   Pulse 66   Temp (!) 96.9 F (36.1 C)   Ht 5' 11.5" (1.816 m)   Wt 225 lb (102.1 kg)   SpO2 96%   BMI 30.94 kg/m    Repeat blood pressure by me was 124/68  Wt Readings from Last 3 Encounters:  12/06/19 225 lb (102.1 kg)  11/22/19 223 lb (101.2 kg)  11/21/19 222 lb (100.7 kg)   General: Alert, oriented, no distress.  Skin: normal turgor, no rashes, warm and dry HEENT: Normocephalic, atraumatic. Pupils equal round and reactive to light; sclera anicteric; extraocular muscles intact;  Nose without nasal septal hypertrophy Mouth/Parynx benign; Mallinpatti scale 3 Neck: No JVD, no carotid bruits; normal carotid upstroke Lungs: clear to ausculatation and percussion; no wheezing or rales Chest wall: without tenderness to palpitation Heart: PMI not displaced, RRR, s1 s2 normal, 1/6 systolic murmur, no diastolic murmur, no rubs, gallops, thrills,  or heaves Abdomen: Bandage over recent melanoma resection;soft, nontender; no hepatosplenomehaly, BS+; abdominal aorta nontender and not dilated by palpation. Back: no CVA tenderness Pulses 2+ Musculoskeletal: full range of motion, normal strength, no joint deformities Extremities: no clubbing cyanosis or edema, Homan's sign negative  Neurologic: grossly nonfocal; Cranial nerves grossly wnl Psychologic: Normal mood and affect   ECG (independently read by me): Normal sinus rhythm at 66 bpm.  No ectopy.  Normal intervals  March 2019 ECG (independently read by me): normal sinus rhythm at 72 bpm.  No ectopy.  Normal intervals.  January 2018 ECG (independently read by me): Normal sinus rhythm at 83 bpm.  No ectopy.  Normal  intervals.  No ST segment changes.  January 2017 ECG (independently read by me):  Normal sinus rhythm at 75 bpm.  Small Q wave in lead 3.  No ST segment changes.  Normal intervals.  December 2015 ECG (independently read by me): Normal sinus rhythm at 86 bpm.  Q wave in lead 3.  No ST segment changes.  October 2015 ECG (independently read by me): Normal sinus rhythm at 77 bpm.  No ectopy.  Normal intervals.  November 2014 ECG: Sinus rhythm at 77 beats per minute. No change.  LABS: BMP Latest Ref Rng & Units 11/22/2019 10/11/2019 07/11/2019  Glucose 70 - 99 mg/dL 106(H) 124(H) 143(H)  BUN 8 - 23 mg/dL 32(H) 28(H) 19  Creatinine 0.61 - 1.24 mg/dL 1.10 1.07 0.96  BUN/Creat Ratio 10 - 24 - - -  Sodium 135 - 145 mmol/L 142 143 142  Potassium 3.5 - 5.1 mmol/L 4.0 3.9 4.1  Chloride 98 - 111 mmol/L 107 109 108  CO2 22 - 32 mmol/L _0 Calcium 8.9 - 10.3 mg/dL 9.7 9.6 9.5   Hepatic Function Latest Ref Rng & Units 11/22/2019 10/11/2019 07/11/2019  Total Protein 6.5 - 8.1 g/dL 6.6 6.7 6.4(L)  Albumin 3.5 - 5.0 g/dL 4.7 4.6 4.5  AST 15 - 41 U/L _1 ALT 0 - 44 U/L _2 Alk Phosphatase 38 - 126 U/L 49 59 57  Total Bilirubin 0.3 - 1.2 mg/dL 0.9 0.8 0.7  Bilirubin, Direct 0.00 - 0.40 mg/dL - - -   CBC Latest Ref Rng & Units 11/22/2019 10/11/2019 07/11/2019  WBC 4.0 - 10.5 K/uL 6.6 5.4 5.0  Hemoglobin 13.0 - 17.0 g/dL 16.2 17.0 16.1  Hematocrit 39 - 52 % 49.0 51.1 49.2  Platelets 150 - 400 K/uL 133(L) 124(L) 145(L)   Lab Results  Component Value Date   MCV 92.5 11/22/2019   MCV 91.1 10/11/2019   MCV 89.5 07/11/2019   Lab Results  Component Value Date   TSH 1.465 10/11/2019   Lab Results  Component Value Date   HGBA1C 6.0 08/07/2019     Lipid Panel     Component Value Date/Time   CHOL 102 08/07/2019 0807   CHOL 150 07/20/2012 0947   TRIG 83 08/07/2019 0807   TRIG 132 06/25/2016 0929   TRIG 128 07/20/2012 0947   HDL 32 (L) 08/07/2019 0807   HDL 28 (L) 06/25/2016 0929    HDL 31 (L) 07/20/2012 0947   CHOLHDL 3.2 08/07/2019 0807   CHOLHDL 4.0 02/11/2014 0849   VLDL 20 02/11/2014 0849   LDLCALC 53 08/07/2019 0807   LDLCALC 82 08/27/2013 0852   LDLCALC 93 07/20/2012 0947   RADIOLOGY: No results found.  IMPRESSION:  1. Essential hypertension, benign   2. Hyperlipidemia with target LDL less  than 70   3. OSA (obstructive sleep apnea)   4. Mild obesity   5. COPD, mild ([place])     ASSESSMENT AND PLAN: Mr. Lagman is a 74 year old gentleman who has a history of mild hypertension, hyperlipidemia, strong family history for coronary artery disease, as well as polycythemia vera JAK2 negative and chronic immune thrombocytopenia.  He has documented normal LV systolic function with mild LVH and grade 1 diastolic dysfunction.  He denies any chest pain or significant shortness of breath.  He had recently suffered an episode of transient amnesia and after extensive neurologic work-up was felt to have transient global amnesia.  His blood pressure today is stable on his current reduced dose of losartan at 25 mg daily.  He continues to be on rosuvastatin for hyperlipidemia.  Most recent cholesterol in April 2021 was 102 with an LDL of 53.  He is on Singulair for COPD in addition to Combivent.  He admits to 100% compliance with reference to his CPAP use for obstructive sleep apnea and is followed at the [place].  In October 2020 he was found to have mild carotid disease with plaque in the 1 to 39% bilaterally.  He will continue current therapy.  He has been successful with some weight loss with weight in 2019 at 238 and most recently 225.  We discussed the importance of continued exercise and additional weight loss.  I will see him in 1 year for reevaluation.   Troy Sine, MD, [facility]  12/08/2019 2:47 PM

## 2019-12-06 NOTE — Patient Instructions (Signed)

## 2019-12-08 ENCOUNTER — Encounter: Payer: Self-pay | Admitting: Cardiovascular Disease

## 2019-12-19 ENCOUNTER — Ambulatory Visit (INDEPENDENT_AMBULATORY_CARE_PROVIDER_SITE_OTHER): Payer: Medicare Other | Admitting: Family Medicine

## 2019-12-24 ENCOUNTER — Ambulatory Visit (INDEPENDENT_AMBULATORY_CARE_PROVIDER_SITE_OTHER): Payer: Medicare Other | Admitting: Family Medicine

## 2019-12-27 DIAGNOSIS — J069 Acute upper respiratory infection, unspecified: Secondary | ICD-10-CM | POA: Diagnosis not present

## 2019-12-27 DIAGNOSIS — R05 Cough: Secondary | ICD-10-CM | POA: Diagnosis not present

## 2019-12-27 DIAGNOSIS — Z20822 Contact with and (suspected) exposure to covid-19: Secondary | ICD-10-CM | POA: Diagnosis not present

## 2019-12-31 DIAGNOSIS — U071 COVID-19: Secondary | ICD-10-CM | POA: Diagnosis not present

## 2020-01-01 ENCOUNTER — Other Ambulatory Visit: Payer: Self-pay | Admitting: Nurse Practitioner

## 2020-01-01 ENCOUNTER — Encounter: Payer: Self-pay | Admitting: Nurse Practitioner

## 2020-01-01 ENCOUNTER — Other Ambulatory Visit (HOSPITAL_COMMUNITY): Payer: Self-pay

## 2020-01-01 DIAGNOSIS — G4733 Obstructive sleep apnea (adult) (pediatric): Secondary | ICD-10-CM

## 2020-01-01 DIAGNOSIS — E785 Hyperlipidemia, unspecified: Secondary | ICD-10-CM

## 2020-01-01 DIAGNOSIS — I10 Essential (primary) hypertension: Secondary | ICD-10-CM

## 2020-01-01 DIAGNOSIS — J449 Chronic obstructive pulmonary disease, unspecified: Secondary | ICD-10-CM

## 2020-01-01 DIAGNOSIS — I517 Cardiomegaly: Secondary | ICD-10-CM

## 2020-01-01 DIAGNOSIS — C44321 Squamous cell carcinoma of skin of nose: Secondary | ICD-10-CM

## 2020-01-01 DIAGNOSIS — C300 Malignant neoplasm of nasal cavity: Secondary | ICD-10-CM

## 2020-01-01 DIAGNOSIS — D696 Thrombocytopenia, unspecified: Secondary | ICD-10-CM

## 2020-01-01 DIAGNOSIS — R7303 Prediabetes: Secondary | ICD-10-CM

## 2020-01-01 DIAGNOSIS — U071 COVID-19: Secondary | ICD-10-CM

## 2020-01-01 DIAGNOSIS — I6523 Occlusion and stenosis of bilateral carotid arteries: Secondary | ICD-10-CM

## 2020-01-01 NOTE — Progress Notes (Signed)
I connected by phone with Connor [name] on 01/01/2020 at 4:24 PM to discuss the potential use of a new treatment for mild to moderate COVID-19 viral infection in non-hospitalized patients.  This patient is a 74 y.o. male that meets the FDA criteria for Emergency Use Authorization of COVID monoclonal antibody casirivimab/imdevimab.  Has a (+) direct SARS-CoV-2 viral test result  Has mild or moderate COVID-19   Is NOT hospitalized due to COVID-19  Is within 10 days of symptom onset  Has at least one of the high risk factor(s) for progression to severe COVID-19 and/or hospitalization as defined in EUA.  Specific high risk criteria : Older age (>/= 76 yo), BMI > 25, Cardiovascular disease or hypertension and Chronic Lung Disease   I have spoken and communicated the following to the patient or parent/caregiver regarding COVID monoclonal antibody treatment:  1. FDA has authorized the emergency use for the treatment of mild to moderate COVID-19 in adults and pediatric patients with positive results of direct SARS-CoV-2 viral testing who are 37 years of age and older weighing at least 40 kg, and who are at high risk for progressing to severe COVID-19 and/or hospitalization.  2. The significant known and potential risks and benefits of COVID monoclonal antibody, and the extent to which such potential risks and benefits are unknown.  3. Information on available alternative treatments and the risks and benefits of those alternatives, including clinical trials.  4. Patients treated with COVID monoclonal antibody should continue to self-isolate and use infection control measures (e.g., wear mask, isolate, social distance, avoid sharing personal items, clean and disinfect "high touch" surfaces, and frequent handwashing) according to CDC guidelines.   5. The patient or parent/caregiver has the option to accept or refuse COVID monoclonal antibody treatment.  After reviewing this information with the  patient, The patient agreed to proceed with receiving casirivimab\imdevimab infusion and will be provided a copy of the Fact sheet prior to receiving the infusion. [name] 01/01/2020 4:24 PM

## 2020-01-02 ENCOUNTER — Other Ambulatory Visit: Payer: Self-pay

## 2020-01-02 ENCOUNTER — Inpatient Hospital Stay (HOSPITAL_COMMUNITY)
Admission: EM | Admit: 2020-01-02 | Discharge: 2020-02-11 | DRG: 177 | Disposition: E | Payer: Medicare Other | Attending: Internal Medicine | Admitting: Internal Medicine

## 2020-01-02 ENCOUNTER — Ambulatory Visit (HOSPITAL_COMMUNITY)
Admission: RE | Admit: 2020-01-02 | Discharge: 2020-01-02 | Disposition: A | Discharge status: Home / Self Care | Payer: Medicare Other | Source: Ambulatory Visit | Attending: Pulmonary Disease | Admitting: Pulmonary Disease

## 2020-01-02 ENCOUNTER — Emergency Department (HOSPITAL_COMMUNITY): Payer: Medicare Other

## 2020-01-02 ENCOUNTER — Encounter (HOSPITAL_COMMUNITY): Payer: Self-pay | Admitting: *Deleted

## 2020-01-02 DIAGNOSIS — E669 Obesity, unspecified: Secondary | ICD-10-CM | POA: Diagnosis present

## 2020-01-02 DIAGNOSIS — I82403 Acute embolism and thrombosis of unspecified deep veins of lower extremity, bilateral: Secondary | ICD-10-CM | POA: Diagnosis not present

## 2020-01-02 DIAGNOSIS — B370 Candidal stomatitis: Secondary | ICD-10-CM | POA: Diagnosis not present

## 2020-01-02 DIAGNOSIS — I110 Hypertensive heart disease with heart failure: Secondary | ICD-10-CM | POA: Diagnosis present

## 2020-01-02 DIAGNOSIS — C300 Malignant neoplasm of nasal cavity: Secondary | ICD-10-CM | POA: Insufficient documentation

## 2020-01-02 DIAGNOSIS — E559 Vitamin D deficiency, unspecified: Secondary | ICD-10-CM | POA: Diagnosis present

## 2020-01-02 DIAGNOSIS — R109 Unspecified abdominal pain: Secondary | ICD-10-CM

## 2020-01-02 DIAGNOSIS — J982 Interstitial emphysema: Secondary | ICD-10-CM | POA: Diagnosis not present

## 2020-01-02 DIAGNOSIS — J1282 Pneumonia due to coronavirus disease 2019: Secondary | ICD-10-CM | POA: Diagnosis not present

## 2020-01-02 DIAGNOSIS — G4733 Obstructive sleep apnea (adult) (pediatric): Secondary | ICD-10-CM

## 2020-01-02 DIAGNOSIS — I2510 Atherosclerotic heart disease of native coronary artery without angina pectoris: Secondary | ICD-10-CM | POA: Diagnosis present

## 2020-01-02 DIAGNOSIS — M2578 Osteophyte, vertebrae: Secondary | ICD-10-CM | POA: Diagnosis not present

## 2020-01-02 DIAGNOSIS — U071 COVID-19: Secondary | ICD-10-CM

## 2020-01-02 DIAGNOSIS — R0902 Hypoxemia: Secondary | ICD-10-CM | POA: Diagnosis not present

## 2020-01-02 DIAGNOSIS — J449 Chronic obstructive pulmonary disease, unspecified: Secondary | ICD-10-CM | POA: Insufficient documentation

## 2020-01-02 DIAGNOSIS — Z6831 Body mass index (BMI) 31.0-31.9, adult: Secondary | ICD-10-CM

## 2020-01-02 DIAGNOSIS — D751 Secondary polycythemia: Secondary | ICD-10-CM | POA: Diagnosis not present

## 2020-01-02 DIAGNOSIS — Z23 Encounter for immunization: Secondary | ICD-10-CM | POA: Diagnosis not present

## 2020-01-02 DIAGNOSIS — R7303 Prediabetes: Secondary | ICD-10-CM | POA: Diagnosis present

## 2020-01-02 DIAGNOSIS — I700 Atherosclerosis of aorta: Secondary | ICD-10-CM | POA: Diagnosis not present

## 2020-01-02 DIAGNOSIS — K5900 Constipation, unspecified: Secondary | ICD-10-CM | POA: Diagnosis not present

## 2020-01-02 DIAGNOSIS — E785 Hyperlipidemia, unspecified: Secondary | ICD-10-CM

## 2020-01-02 DIAGNOSIS — R7989 Other specified abnormal findings of blood chemistry: Secondary | ICD-10-CM | POA: Diagnosis not present

## 2020-01-02 DIAGNOSIS — Z85828 Personal history of other malignant neoplasm of skin: Secondary | ICD-10-CM

## 2020-01-02 DIAGNOSIS — J9601 Acute respiratory failure with hypoxia: Secondary | ICD-10-CM | POA: Diagnosis present

## 2020-01-02 DIAGNOSIS — J440 Chronic obstructive pulmonary disease with acute lower respiratory infection: Secondary | ICD-10-CM | POA: Diagnosis present

## 2020-01-02 DIAGNOSIS — R531 Weakness: Secondary | ICD-10-CM | POA: Diagnosis not present

## 2020-01-02 DIAGNOSIS — N400 Enlarged prostate without lower urinary tract symptoms: Secondary | ICD-10-CM

## 2020-01-02 DIAGNOSIS — R410 Disorientation, unspecified: Secondary | ICD-10-CM | POA: Diagnosis not present

## 2020-01-02 DIAGNOSIS — D45 Polycythemia vera: Secondary | ICD-10-CM | POA: Diagnosis present

## 2020-01-02 DIAGNOSIS — Z66 Do not resuscitate: Secondary | ICD-10-CM | POA: Diagnosis not present

## 2020-01-02 DIAGNOSIS — I6523 Occlusion and stenosis of bilateral carotid arteries: Secondary | ICD-10-CM | POA: Insufficient documentation

## 2020-01-02 DIAGNOSIS — R0602 Shortness of breath: Secondary | ICD-10-CM

## 2020-01-02 DIAGNOSIS — E871 Hypo-osmolality and hyponatremia: Secondary | ICD-10-CM | POA: Diagnosis not present

## 2020-01-02 DIAGNOSIS — D696 Thrombocytopenia, unspecified: Secondary | ICD-10-CM | POA: Diagnosis present

## 2020-01-02 DIAGNOSIS — I10 Essential (primary) hypertension: Secondary | ICD-10-CM

## 2020-01-02 DIAGNOSIS — I5033 Acute on chronic diastolic (congestive) heart failure: Secondary | ICD-10-CM | POA: Diagnosis present

## 2020-01-02 DIAGNOSIS — Z87891 Personal history of nicotine dependence: Secondary | ICD-10-CM

## 2020-01-02 DIAGNOSIS — R059 Cough, unspecified: Secondary | ICD-10-CM | POA: Diagnosis not present

## 2020-01-02 DIAGNOSIS — C44321 Squamous cell carcinoma of skin of nose: Secondary | ICD-10-CM | POA: Insufficient documentation

## 2020-01-02 DIAGNOSIS — I517 Cardiomegaly: Secondary | ICD-10-CM | POA: Diagnosis not present

## 2020-01-02 DIAGNOSIS — Z888 Allergy status to other drugs, medicaments and biological substances status: Secondary | ICD-10-CM

## 2020-01-02 DIAGNOSIS — J189 Pneumonia, unspecified organism: Secondary | ICD-10-CM | POA: Diagnosis not present

## 2020-01-02 DIAGNOSIS — I82451 Acute embolism and thrombosis of right peroneal vein: Secondary | ICD-10-CM | POA: Diagnosis not present

## 2020-01-02 DIAGNOSIS — Z7189 Other specified counseling: Secondary | ICD-10-CM | POA: Diagnosis not present

## 2020-01-02 DIAGNOSIS — Z9049 Acquired absence of other specified parts of digestive tract: Secondary | ICD-10-CM

## 2020-01-02 DIAGNOSIS — K219 Gastro-esophageal reflux disease without esophagitis: Secondary | ICD-10-CM | POA: Diagnosis present

## 2020-01-02 DIAGNOSIS — K5730 Diverticulosis of large intestine without perforation or abscess without bleeding: Secondary | ICD-10-CM | POA: Diagnosis not present

## 2020-01-02 DIAGNOSIS — Z8249 Family history of ischemic heart disease and other diseases of the circulatory system: Secondary | ICD-10-CM

## 2020-01-02 DIAGNOSIS — Z515 Encounter for palliative care: Secondary | ICD-10-CM

## 2020-01-02 DIAGNOSIS — M1990 Unspecified osteoarthritis, unspecified site: Secondary | ICD-10-CM | POA: Diagnosis present

## 2020-01-02 DIAGNOSIS — Z79899 Other long term (current) drug therapy: Secondary | ICD-10-CM

## 2020-01-02 DIAGNOSIS — N529 Male erectile dysfunction, unspecified: Secondary | ICD-10-CM | POA: Diagnosis present

## 2020-01-02 DIAGNOSIS — R918 Other nonspecific abnormal finding of lung field: Secondary | ICD-10-CM | POA: Diagnosis not present

## 2020-01-02 DIAGNOSIS — Z8261 Family history of arthritis: Secondary | ICD-10-CM

## 2020-01-02 LAB — TRIGLYCERIDES: Triglycerides: 75 mg/dL (ref <150)

## 2020-01-02 LAB — D-DIMER, QUANTITATIVE: D-Dimer, Quant: 0.41 ug/mL-FEU (ref 0.00–0.50)

## 2020-01-02 LAB — COMPREHENSIVE METABOLIC PANEL
ALT: 31 U/L (ref 0–44)
AST: 53 U/L — ABNORMAL HIGH (ref 15–41)
Albumin: 3.7 g/dL (ref 3.5–5.0)
Alkaline Phosphatase: 63 U/L (ref 38–126)
Anion gap: 10 (ref 5–15)
BUN: 29 mg/dL — ABNORMAL HIGH (ref 8–23)
CO2: 25 mmol/L (ref 22–32)
Calcium: 8.4 mg/dL — ABNORMAL LOW (ref 8.9–10.3)
Chloride: 100 mmol/L (ref 98–111)
Creatinine, Ser: 1.29 mg/dL — ABNORMAL HIGH (ref 0.61–1.24)
GFR calc Af Amer: >60 mL/min (ref >60)
GFR calc non Af Amer: 53 mL/min — ABNORMAL LOW (ref >60)
Glucose, Bld: 135 mg/dL — ABNORMAL HIGH (ref 70–99)
Potassium: 3.9 mmol/L (ref 3.5–5.1)
Sodium: 135 mmol/L (ref 135–145)
Total Bilirubin: 0.7 mg/dL (ref 0.3–1.2)
Total Protein: 6.4 g/dL — ABNORMAL LOW (ref 6.5–8.1)

## 2020-01-02 LAB — CBC WITH DIFFERENTIAL/PLATELET
Abs Immature Granulocytes: 0.02 10*3/uL (ref 0.00–0.07)
Basophils Absolute: 0 10*3/uL (ref 0.0–0.1)
Basophils Relative: 0 %
Eosinophils Absolute: 0 10*3/uL (ref 0.0–0.5)
Eosinophils Relative: 0 %
HCT: 44.7 % (ref 39.0–52.0)
Hemoglobin: 14.8 g/dL (ref 13.0–17.0)
Immature Granulocytes: 1 %
Lymphocytes Relative: 12 %
Lymphs Abs: 0.3 10*3/uL — ABNORMAL LOW (ref 0.7–4.0)
MCH: 30 pg (ref 26.0–34.0)
MCHC: 33.1 g/dL (ref 30.0–36.0)
MCV: 90.7 fL (ref 80.0–100.0)
Monocytes Absolute: 0.1 10*3/uL (ref 0.1–1.0)
Monocytes Relative: 6 %
Neutro Abs: 1.9 10*3/uL (ref 1.7–7.7)
Neutrophils Relative %: 81 %
Platelets: 84 10*3/uL — ABNORMAL LOW (ref 150–400)
RBC: 4.93 MIL/uL (ref 4.22–5.81)
RDW: 12.6 % (ref 11.5–15.5)
WBC: 2.4 10*3/uL — ABNORMAL LOW (ref 4.0–10.5)
nRBC: 0 % (ref 0.0–0.2)

## 2020-01-02 LAB — PROCALCITONIN: Procalcitonin: <0.1 ng/mL

## 2020-01-02 LAB — FIBRINOGEN: Fibrinogen: 518 mg/dL — ABNORMAL HIGH (ref 210–475)

## 2020-01-02 LAB — C-REACTIVE PROTEIN: CRP: 8.1 mg/dL — ABNORMAL HIGH (ref <1.0)

## 2020-01-02 LAB — FERRITIN: Ferritin: 321 ng/mL (ref 24–336)

## 2020-01-02 LAB — LACTIC ACID, PLASMA: Lactic Acid, Venous: 0.8 mmol/L (ref 0.5–1.9)

## 2020-01-02 LAB — LACTATE DEHYDROGENASE: LDH: 239 U/L — ABNORMAL HIGH (ref 98–192)

## 2020-01-02 MED ORDER — DEXAMETHASONE SODIUM PHOSPHATE 10 MG/ML IJ SOLN
10.0000 mg | Freq: Once | INTRAMUSCULAR | Status: AC
  Administered 2020-01-02: 10 mg via INTRAVENOUS
  Filled 2020-01-02: qty 1

## 2020-01-02 MED ORDER — SODIUM CHLORIDE 0.9 % IV SOLN
100.0000 mg | Freq: Every day | INTRAVENOUS | Status: AC
  Administered 2020-01-03 – 2020-01-06 (×4): 100 mg via INTRAVENOUS
  Filled 2020-01-02 (×4): qty 20

## 2020-01-02 MED ORDER — IPRATROPIUM-ALBUTEROL 20-100 MCG/ACT IN AERS
1.0000 | INHALATION_SPRAY | RESPIRATORY_TRACT | Status: DC | PRN
  Filled 2020-01-02: qty 4

## 2020-01-02 MED ORDER — ALFUZOSIN HCL ER 10 MG PO TB24
10.0000 mg | ORAL_TABLET | Freq: Every morning | ORAL | Status: DC
  Administered 2020-01-03 – 2020-01-22 (×19): 10 mg via ORAL
  Filled 2020-01-02 (×25): qty 1

## 2020-01-02 MED ORDER — LOSARTAN POTASSIUM 25 MG PO TABS
25.0000 mg | ORAL_TABLET | Freq: Every day | ORAL | Status: DC
  Administered 2020-01-03: 25 mg via ORAL
  Filled 2020-01-02: qty 1

## 2020-01-02 MED ORDER — LORATADINE 10 MG PO TABS
10.0000 mg | ORAL_TABLET | Freq: Every day | ORAL | Status: DC
  Administered 2020-01-03 – 2020-01-19 (×17): 10 mg via ORAL
  Filled 2020-01-02 (×19): qty 1

## 2020-01-02 MED ORDER — CYCLOBENZAPRINE HCL 10 MG PO TABS
10.0000 mg | ORAL_TABLET | Freq: Three times a day (TID) | ORAL | Status: DC | PRN
  Administered 2020-01-08 – 2020-01-14 (×4): 10 mg via ORAL
  Filled 2020-01-02 (×5): qty 1

## 2020-01-02 MED ORDER — MONTELUKAST SODIUM 10 MG PO TABS
10.0000 mg | ORAL_TABLET | Freq: Every day | ORAL | Status: DC
  Administered 2020-01-03 – 2020-01-21 (×20): 10 mg via ORAL
  Filled 2020-01-02 (×22): qty 1

## 2020-01-02 MED ORDER — ALBUTEROL SULFATE HFA 108 (90 BASE) MCG/ACT IN AERS: 2.0000 | INHALATION_SPRAY | Freq: Once | RESPIRATORY_TRACT | Status: DC | PRN

## 2020-01-02 MED ORDER — ACETAMINOPHEN 325 MG PO TABS
650.0000 mg | ORAL_TABLET | Freq: Four times a day (QID) | ORAL | Status: DC | PRN
  Administered 2020-01-02: 650 mg via ORAL
  Filled 2020-01-02: qty 2

## 2020-01-02 MED ORDER — ESOMEPRAZOLE MAGNESIUM 20 MG PO CPDR
20.0000 mg | DELAYED_RELEASE_CAPSULE | Freq: Every day | ORAL | Status: DC | PRN
  Filled 2020-01-02: qty 1

## 2020-01-02 MED ORDER — FAMOTIDINE IN NACL 20-0.9 MG/50ML-% IV SOLN: 20.0000 mg | Freq: Once | INTRAVENOUS | Status: DC | PRN

## 2020-01-02 MED ORDER — ACETAMINOPHEN 325 MG PO TABS
650.0000 mg | ORAL_TABLET | Freq: Four times a day (QID) | ORAL | Status: DC | PRN
  Administered 2020-01-03 – 2020-01-19 (×19): 650 mg via ORAL
  Filled 2020-01-02 (×19): qty 2

## 2020-01-02 MED ORDER — METHYLPREDNISOLONE SODIUM SUCC 125 MG IJ SOLR: 125.0000 mg | Freq: Once | INTRAMUSCULAR | Status: DC | PRN

## 2020-01-02 MED ORDER — SODIUM CHLORIDE 0.9 % IV SOLN
1200.0000 mg | Freq: Once | INTRAVENOUS | Status: AC
  Administered 2020-01-02: 1200 mg via INTRAVENOUS

## 2020-01-02 MED ORDER — SENNA 8.6 MG PO TABS
1.0000 | ORAL_TABLET | Freq: Two times a day (BID) | ORAL | Status: DC
  Administered 2020-01-03 – 2020-01-16 (×26): 8.6 mg via ORAL
  Filled 2020-01-02 (×27): qty 1

## 2020-01-02 MED ORDER — DIPHENHYDRAMINE HCL 50 MG/ML IJ SOLN: 50.0000 mg | Freq: Once | INTRAMUSCULAR | Status: DC | PRN

## 2020-01-02 MED ORDER — HYDROCOD POLST-CPM POLST ER 10-8 MG/5ML PO SUER
5.0000 mL | Freq: Two times a day (BID) | ORAL | Status: DC | PRN
  Administered 2020-01-04 – 2020-01-22 (×8): 5 mL via ORAL
  Filled 2020-01-02 (×8): qty 5

## 2020-01-02 MED ORDER — METHYLPREDNISOLONE SODIUM SUCC 125 MG IJ SOLR
50.0000 mg | Freq: Two times a day (BID) | INTRAMUSCULAR | Status: AC
  Administered 2020-01-03 – 2020-01-05 (×6): 50 mg via INTRAVENOUS
  Filled 2020-01-02 (×6): qty 2

## 2020-01-02 MED ORDER — ENOXAPARIN SODIUM 40 MG/0.4ML ~~LOC~~ SOLN
40.0000 mg | SUBCUTANEOUS | Status: DC
  Administered 2020-01-03 – 2020-01-07 (×5): 40 mg via SUBCUTANEOUS
  Filled 2020-01-02 (×5): qty 0.4

## 2020-01-02 MED ORDER — EPINEPHRINE 0.3 MG/0.3ML IJ SOAJ: 0.3000 mg | Freq: Once | INTRAMUSCULAR | Status: DC | PRN

## 2020-01-02 MED ORDER — PREDNISONE 20 MG PO TABS
50.0000 mg | ORAL_TABLET | Freq: Every day | ORAL | Status: DC
  Administered 2020-01-06 – 2020-01-07 (×2): 50 mg via ORAL
  Filled 2020-01-02 (×2): qty 2

## 2020-01-02 MED ORDER — GUAIFENESIN-DM 100-10 MG/5ML PO SYRP
10.0000 mL | ORAL_SOLUTION | ORAL | Status: DC | PRN
  Administered 2020-01-04 – 2020-01-18 (×9): 10 mL via ORAL
  Filled 2020-01-02 (×9): qty 10

## 2020-01-02 MED ORDER — FERROUS SULFATE 325 (65 FE) MG PO TABS
325.0000 mg | ORAL_TABLET | Freq: Every day | ORAL | Status: DC
  Administered 2020-01-03 – 2020-01-19 (×17): 325 mg via ORAL
  Filled 2020-01-02 (×18): qty 1

## 2020-01-02 MED ORDER — SODIUM CHLORIDE 0.9 % IV SOLN
200.0000 mg | Freq: Once | INTRAVENOUS | Status: AC
  Administered 2020-01-02: 200 mg via INTRAVENOUS
  Filled 2020-01-02: qty 200

## 2020-01-02 MED ORDER — CYANOCOBALAMIN 500 MCG PO TABS
500.0000 ug | ORAL_TABLET | Freq: Every day | ORAL | Status: DC
  Administered 2020-01-03 – 2020-01-19 (×17): 500 ug via ORAL
  Filled 2020-01-02 (×19): qty 1

## 2020-01-02 MED ORDER — SODIUM CHLORIDE 0.9 % IV SOLN
1000.0000 mL | INTRAVENOUS | Status: DC
  Administered 2020-01-02 – 2020-01-03 (×3): 1000 mL via INTRAVENOUS

## 2020-01-02 MED ORDER — ROSUVASTATIN CALCIUM 10 MG PO TABS
10.0000 mg | ORAL_TABLET | Freq: Every day | ORAL | Status: DC
  Administered 2020-01-03 – 2020-01-19 (×17): 10 mg via ORAL
  Filled 2020-01-02 (×18): qty 1

## 2020-01-02 MED ORDER — SODIUM CHLORIDE 0.9 % IV SOLN: INTRAVENOUS | Status: DC | PRN

## 2020-01-02 NOTE — Progress Notes (Signed)
Called to evaluate patient after infusion of MAB.   Patient pre-infusion had adequate oxygen saturations 92% with history of COPD, no regular oxygen use. At completion of therapy his resting pulse oxygen saturation only 85%. Applied 3 LPM Oxygen with rate coming up to 92% - states he feels much better with oxygen on.   I do not feel comfortable sending him home from clinic without evaluation and oxygen resource. Discussed with the patient and his wife. Will help him get down to ER for evaluation.    Janene Madeira, MSN, NP-C [facility] [email or web address] Pager: [phone] Office: [phone] [place]: [phone]

## 2020-01-02 NOTE — ED Provider Notes (Signed)
[facility] DEPT Provider Note   CSN: [id number] Arrival date & time: 12/17/2019  1618     History No chief complaint on file.   Connor [name] is a 74 y.o. male.  Pt presents to the ED today with sob and cough.  Pt was diagnosed with Covid on 9/16 (result scanned in on epic).  Pt went to the [facility] clinic today and had his infusion.  After the infusion, his O2 sat was 85%.  They put him on 3L and sent him here.  On oxygen, his O2 sat is in the low 90s.  Pt has not been vaccinated.  He has had a cough and has not had much of an appetite.        Past Medical History:  Diagnosis Date  . Anemia   . Arthritis   . Back pain   . BPH (benign prostatic hyperplasia)   . Cancer (HCC)    squamous cell on nose , inside nose   . Cardiomegaly   . Carotid artery stenosis   . COPD, mild ([place]) 05/26/2007   PFT 09/09/11>>FEV1 3.06 (95%), FEV1% 69, FEF 25-75% 1.70 (59%), TLC 7.67 (109%), DLCO 108%, no BD    . Cough 05/26/2007  . Edema, lower extremity   . Erectile dysfunction   . GERD (gastroesophageal reflux disease)   . H/O vitamin D deficiency   . History of nuclear stress test    this revealed normal myocardial perfusion and function. Post stress EF was 57%. There was no region of scar or ischemia.  [name] Hx of echocardiogram 02/15/2012   This showed mild concentric LVH. EF > 55%. H edid have grade 1 diastolic dysfunction with mitral valve E:A ratio of 0.81, his left atrium was mildly dilated by volume assessment at 33.3 mL/m2. He did have mild tricupid regurgitation with upper normal RV systolic pressure at 93OI. He had aortic valve sclerosis without stenosis.  . Hyperlipemia   . Hypertension   . Iron deficiency anemia, unspecified 09/01/2012  . Iron malabsorption 05/28/2016  . Joint pain   . Nephrolithiasis    stones   . Peyronie's disease   . Polycythemia vera(238.4) 04/19/2013  . Shortness of breath    with exertion   . Sleep apnea    Cpap-   .  Thrombocytopenia [facility])     Patient Active Problem List   Diagnosis Date Noted  . Prediabetes 02/12/2019  . Transient alteration of awareness 02/01/2019  . Left carotid bruit 02/01/2019  . Pustule of nostril 10/20/2017  . Squamous cell cancer of skin of nose 07/01/2017  . Erectile dysfunction due to arterial insufficiency 07/01/2017  . Carcinoma of nasal cavity ([place]) 12/22/2016  . Iron malabsorption 05/28/2016  . Torus mandibularis 03/24/2016  . Hyperlipidemia LDL goal <100 04/12/2014  . Ptosis 07/19/2013  . Polycythemia vera ([place]) 04/19/2013  . Family history of early CAD 02/19/2013  . Iron deficiency anemia 09/01/2012  . H/O vitamin D deficiency 05/21/2012  . Diverticulosis of colon 05/21/2012  . Carotid artery stenosis, asymptomatic 05/21/2012  . BPH (benign prostatic hyperplasia)   . Cardiomegaly   . Thrombocytopenia ([place])   . Lap Nissen Fundoplication August 7124 11/26/2011  . ESSENTIAL HYPERTENSION, BENIGN 08/27/2009  . OSA (obstructive sleep apnea) 02/19/2008  . COPD, mild ([place]) 05/26/2007    Past Surgical History:  Procedure Laterality Date  . APPENDECTOMY    . DG THUMB LEFT HAND  03/2019  . eye lid surgery  2015  . EYE SURGERY  Bilateral    eye lid surgery  . HIATAL HERNIA REPAIR  11/24/2011   Procedure: LAPAROSCOPIC REPAIR OF HIATAL HERNIA;  Surgeon: Pedro Earls, MD;  Location: [facility] ORS;  Service: General;;  . KNEE SURGERY     right  . LAPAROSCOPIC NISSEN FUNDOPLICATION  10/02/6331   Procedure: LAPAROSCOPIC NISSEN FUNDOPLICATION;  Surgeon: Pedro Earls, MD;  Location: [facility] ORS;  Service: General;  Laterality: N/A;  . NOSE SURGERY  2013  . SHOULDER SURGERY     left       Family History  Problem Relation Age of Onset  . Heart disease Mother   . Diabetes Mother   . Hypertension Mother   . Obesity Mother   . Heart disease Father   . Hypertension Father   . Alcoholism Father   . Obesity Father   . Heart disease Sister   . Arthritis Sister   .  Diabetes Brother   . Hypertension Brother   . Heart attack Brother        enlarged heart  . Liver cancer Paternal Grandfather   . Throat cancer Maternal Grandfather   . Hemachromatosis Daughter   . Arthritis Daughter   . GI problems Daughter   . Thyroid disease Daughter   . Hypertension Daughter   . Heart disease Paternal Grandmother   . Thyroid disease Daughter        pituitary tumor  . Migraines Daughter   . Colon cancer Neg Hx   . Esophageal cancer Neg Hx   . Rectal cancer Neg Hx   . Stomach cancer Neg Hx     Social History   Tobacco Use  . Smoking status: Former Smoker    Packs/day: 0.25    Years: 11.00    Pack years: 2.75    Types: Cigarettes    Start date: 05/17/1958    Quit date: 04/12/1966    Years since quitting: 53.7  . Smokeless tobacco: Never Used  . Tobacco comment: quit smoking in 1968  Vaping Use  . Vaping Use: Never used  Substance Use Topics  . Alcohol use: Yes    Alcohol/week: 0.0 standard drinks    Comment: seldom  . Drug use: No    Home Medications Prior to Admission medications   Medication Sig Start Date End Date Taking? Authorizing Provider  alfuzosin (UROXATRAL) 10 MG 24 hr tablet Take 10 mg by mouth every morning.     [provider]  Ascorbic Acid (VITAMIN C) 100 MG tablet Take 100 mg by mouth daily.    [provider]  aspirin 81 MG tablet Take 81 mg by mouth 2 (two) times daily.     [provider]  Black Elderberry 50 MG/5ML SYRP Take by mouth daily. 2 tablespoons.    [provider]  cetirizine (ZYRTEC) 10 MG tablet Take 10 mg by mouth daily.    [provider]  Coenzyme Q10 (CO Q 10 PO) Take 1 capsule by mouth daily.     [provider]  cyclobenzaprine (FLEXERIL) 10 MG tablet TAKE ONE TABLET BY MOUTH THREE TIMES DAILY AS NEEDED FOR MUSCLE SPASMS 10/22/19   Dettinger, Fransisca Kaufmann, MD  Esomeprazole Magnesium (NEXIUM 24HR) 20 MG TBEC Nexium 24HR 20 mg tablet,delayed release    [provider]  ferrous sulfate 325 (65 FE) MG tablet Take 1 tablet (325 mg total) by mouth daily with breakfast. 09/20/18   [name], MD  Glucosamine-Chondroit-Vit C-Mn (GLUCOSAMINE CHONDR 1500 COMPLX) CAPS Take  1 capsule by mouth daily.     [provider]  Ipratropium-Albuterol (COMBIVENT) 20-100 MCG/ACT AERS respimat Inhale 1 puff into the lungs as needed for wheezing or shortness of breath. 09/20/18   [name], MD  losartan (COZAAR) 50 MG tablet Take 1 tablet (50 mg total) by mouth daily. Patient taking differently: Take 25 mg by mouth daily.  09/20/18   [name], MD  montelukast (SINGULAIR) 10 MG tablet TAKE ONE TABLET BY MOUTH AT BEDTIME 10/30/19   Dettinger, Fransisca Kaufmann, MD  Multiple Vitamins-Minerals (CENTRUM SILVER ULTRA MENS) TABS Take 1 tablet by mouth every morning. Once a day    [provider]  Probiotic Product ([place]) Take 1 capsule by mouth daily.     [provider]  pyridOXINE (VITAMIN B-6) 100 MG tablet Take 100 mg by mouth daily.     [provider]  rosuvastatin (CRESTOR) 10 MG tablet TAKE ONE TABLET BY MOUTH DAILY 08/30/19   Dettinger, Fransisca Kaufmann, MD  sildenafil (REVATIO) 20 MG tablet Take 2-5 tabs prior to sexual activity Patient taking differently: Take 20 mg by mouth 3 (three) times daily as needed (For sexual activity).  12/31/15   [name], MD  Turmeric 500 MG CAPS Take 1 tablet by mouth daily.     [provider]  vitamin B-12 (CYANOCOBALAMIN) 500 MCG tablet Take 500 mcg by mouth daily.    [provider]  Vitamin D, Ergocalciferol, (DRISDOL) 1.25 MG (50000 UNIT) CAPS capsule Take 1 capsule (50,000 Units total) by mouth every 7 (seven) days. 11/26/19   Opalski, Neoma Laming, DO  Zinc 50 MG TABS Take by mouth daily.    [provider]    Allergies    Dexlansoprazole and Celecoxib  Review of Systems   Review of Systems  Constitutional: Positive for fatigue and fever.   Respiratory: Positive for cough and shortness of breath.   All other systems reviewed and are negative.   Physical Exam Updated Vital Signs BP 124/76 (BP Location: Left Arm)   Pulse (!) 106   Temp 98 F (36.7 C) (Oral)   Resp 18   Ht 5\' 11"  (1.803 m)   Wt 102.1 kg   SpO2 92%   BMI 31.38 kg/m   Physical Exam Vitals and nursing note reviewed.  Constitutional:      Appearance: Normal appearance.  HENT:     Head: Normocephalic and atraumatic.     Right Ear: External ear normal.     Left Ear: External ear normal.     Nose: Nose normal.     Mouth/Throat:     Mouth: Mucous membranes are moist.     Pharynx: Oropharynx is clear.  Eyes:     Extraocular Movements: Extraocular movements intact.     Conjunctiva/sclera: Conjunctivae normal.     Pupils: Pupils are equal, round, and reactive to light.  Cardiovascular:     Rate and Rhythm: Normal rate and regular rhythm.     Pulses: Normal pulses.     Heart sounds: Normal heart sounds.  Pulmonary:     Effort: Pulmonary effort is normal.     Breath sounds: Normal breath sounds.  Abdominal:     General: Abdomen is flat. Bowel sounds are normal.     Palpations: Abdomen is soft.  Musculoskeletal:        General: Normal range of motion.     Cervical back: Normal range of motion and neck supple.  Skin:  General: Skin is warm.     Capillary Refill: Capillary refill takes less than 2 seconds.  Neurological:     General: No focal deficit present.     Mental Status: He is alert and oriented to person, place, and time.  Psychiatric:        Mood and Affect: Mood normal.        Behavior: Behavior normal.        Thought Content: Thought content normal.        Judgment: Judgment normal.     ED Results / Procedures / Treatments   Labs (all labs ordered are listed, but only abnormal results are displayed) Labs Reviewed  CBC WITH DIFFERENTIAL/PLATELET - Abnormal; Notable for the following components:      Result Value   WBC 2.4 (*)     Platelets 84 (*)    Lymphs Abs 0.3 (*)    All other components within normal limits  COMPREHENSIVE METABOLIC PANEL - Abnormal; Notable for the following components:   Glucose, Bld 135 (*)    BUN 29 (*)    Creatinine, Ser 1.29 (*)    Calcium 8.4 (*)    Total Protein 6.4 (*)    AST 53 (*)    GFR calc non Af Amer 53 (*)    All other components within normal limits  LACTATE DEHYDROGENASE - Abnormal; Notable for the following components:   LDH 239 (*)    All other components within normal limits  FIBRINOGEN - Abnormal; Notable for the following components:   Fibrinogen 518 (*)    All other components within normal limits  C-REACTIVE PROTEIN - Abnormal; Notable for the following components:   CRP 8.1 (*)    All other components within normal limits  CULTURE, BLOOD (ROUTINE X 2)  CULTURE, BLOOD (ROUTINE X 2)  LACTIC ACID, PLASMA  D-DIMER, QUANTITATIVE (NOT AT [facility])  PROCALCITONIN  FERRITIN  TRIGLYCERIDES  LACTIC ACID, PLASMA    EKG None   HR 98.  No st or t wave changes.  QTc 419.   Radiology DG Chest Port 1 View  Result Date: 12/31/2019 CLINICAL DATA:  COVID-19, worsening shortness of breath and oxygen requirement after monoclonal antibody infusion EXAM: PORTABLE CHEST 1 VIEW COMPARISON:  07/02/2016 FINDINGS: Single frontal view of the chest demonstrates an enlarged cardiac silhouette. There is basilar predominant interstitial and ground-glass opacities, which could reflect edema or multifocal pneumonia. No effusion or pneumothorax. No acute bony abnormalities. IMPRESSION: 1. Basilar predominant interstitial and ground-glass opacities, which could reflect multifocal pneumonia or edema. Electronically Signed   By: Randa Ngo [name]   On: 12/20/2019 17:01    Procedures Procedures (including critical care time)  Medications Ordered in ED Medications  0.9 %  sodium chloride infusion (1,000 mLs Intravenous New Bag/Given 01/01/2020 1811)  dexamethasone (DECADRON) injection 10  mg (10 mg Intravenous Given 12/26/2019 1812)    ED Course  I have reviewed the triage vital signs and the nursing notes.  Pertinent labs & imaging results that were available during my care of the patient were reviewed by me and considered in my medical decision making (see chart for details).    MDM Rules/Calculators/A&P                          Pt placed on 3 L oxygen.  O2 sat stays above 90% if he is not moving.  Pt does have covid pneumonia.  Pharmacy consulted for Remdesivir.  He  is given decadron.  He is d/w Dr. Linda Hedges (triad) for admission.  LUISDANIEL KENTON was evaluated in Emergency Department on 12/28/2019 for the symptoms described in the history of present illness. He was evaluated in the context of the global COVID-19 pandemic, which necessitated consideration that the patient might be at risk for infection with the SARS-CoV-2 virus that causes COVID-19. Institutional protocols and algorithms that pertain to the evaluation of patients at risk for COVID-19 are in a state of rapid change based on information released by regulatory bodies including the CDC and federal and state organizations. These policies and algorithms were followed during the patient's care in the ED.  CRITICAL CARE Performed by: Isla Pence   Total critical care time: 30 minutes  Critical care time was exclusive of separately billable procedures and treating other patients.  Critical care was necessary to treat or prevent imminent or life-threatening deterioration.  Critical care was time spent personally by me on the following activities: development of treatment plan with patient and/or surrogate as well as nursing, discussions with consultants, evaluation of patient's response to treatment, examination of patient, obtaining history from patient or surrogate, ordering and performing treatments and interventions, ordering and review of laboratory studies, ordering and review of radiographic studies, pulse  oximetry and re-evaluation of patient's condition.  Final Clinical Impression(s) / ED Diagnoses Final diagnoses:  Pneumonia due to COVID-19 virus  Acute respiratory failure with hypoxia [facility])    Rx / DC Orders ED Discharge Orders    None       Isla Pence, MD 12/21/2019 2055

## 2020-01-02 NOTE — Progress Notes (Addendum)
This RN into pt room to discharge patient and obtain repeat VS, oxygen saturation 87-89% on RA. Pt does not endorse and increased in SOB since before infusion and does not appear to be in any respiratory distress,at rest. Reports that he has increased in RR rate and exertion with movement. NP at bedside to assess and pt placed on 3L [place] at this time. Pt 92% on 3L [place].   NP at bedside to update wife and patient on plan of care, recommends going to ER at this time. Dorie, RN clinic lead called [facility] ED Camera operator.

## 2020-01-02 NOTE — ED Notes (Signed)
Spoke with wife about pt condition at this time. Wife concerned about if pt will be admitted to the hospital and at this time they have not made a decision. Will notify wife when MD decision is made regard POC and disposition.

## 2020-01-02 NOTE — H&P (Signed)
History and Physical    Connor [name] OHY:[id number] DOB: 12-03-1945 DOA: 01/01/2020  PCP: Dettinger, Fransisca Kaufmann, MD (Confirm with patient/family/NH records and if not entered, this has to be entered at [facility] point of entry) Patient coming from: Infusion clinic  I have personally briefly reviewed patient's old medical records in [place]  Chief Complaint: Hypoxemia  HPI: Connor [name] is a 74 y.o. male with medical history significant of Polycythemia Vera, BPH, mild COPD, carotid artery disease was diagnosed as covid + 12/27/19. He had mild symptoms. He was scheduled for and did receive monoclonal antibody infusion today. After treatment his O2 sat was 85%. He was referred to [facility] for evaluation and admission. He is unvaccinated.   ED Course: Tmax 101.8  134/71  HR 101  RR 20  O2 sat 88% RA. Lab revealed glucose 135, Cr. 1.29 (1.10 11/22/19), AST 35, LDH 239, Ferritin 321, CRP 8.1, lactic acid 0.8, Procalcitonin <0.10, Didimer 0.4, WBC 2.4 w/ 81/12/6, HGB 14.8. CXR with mild multifocal ground glass infiltrates. In the ED he was given remdesivir IV and Decadron 10 mg IV. TRH is called to admit the patient for continued treatment.  Review of Systems: As per HPI otherwise 10 point review of systems negative.    Past Medical History:  Diagnosis Date  . Anemia   . Arthritis   . Back pain   . BPH (benign prostatic hyperplasia)   . Cancer (HCC)    squamous cell on nose , inside nose   . Cardiomegaly   . Carotid artery stenosis   . COPD, mild ([place]) 05/26/2007   PFT 09/09/11>>FEV1 3.06 (95%), FEV1% 69, FEF 25-75% 1.70 (59%), TLC 7.67 (109%), DLCO 108%, no BD    . Cough 05/26/2007  . Edema, lower extremity   . Erectile dysfunction   . GERD (gastroesophageal reflux disease)   . H/O vitamin D deficiency   . History of nuclear stress test    this revealed normal myocardial perfusion and function. Post stress EF was 57%. There was no region of scar or ischemia.  [name] Hx of echocardiogram  02/15/2012   This showed mild concentric LVH. EF > 55%. H edid have grade 1 diastolic dysfunction with mitral valve E:A ratio of 0.81, his left atrium was mildly dilated by volume assessment at 33.3 mL/m2. He did have mild tricupid regurgitation with upper normal RV systolic pressure at 94WN. He had aortic valve sclerosis without stenosis.  . Hyperlipemia   . Hypertension   . Iron deficiency anemia, unspecified 09/01/2012  . Iron malabsorption 05/28/2016  . Joint pain   . Nephrolithiasis    stones   . Peyronie's disease   . Polycythemia vera(238.4) 04/19/2013  . Shortness of breath    with exertion   . Sleep apnea    Cpap-   . Thrombocytopenia ([place])     Past Surgical History:  Procedure Laterality Date  . APPENDECTOMY    . DG THUMB LEFT HAND  03/2019  . eye lid surgery  2015  . EYE SURGERY Bilateral    eye lid surgery  . HIATAL HERNIA REPAIR  11/24/2011   Procedure: LAPAROSCOPIC REPAIR OF HIATAL HERNIA;  Surgeon: Pedro Earls, MD;  Location: [facility];  Service: General;;  . KNEE SURGERY     right  . LAPAROSCOPIC NISSEN FUNDOPLICATION  4/62/7035   Procedure: LAPAROSCOPIC NISSEN FUNDOPLICATION;  Surgeon: Pedro Earls, MD;  Location: [facility];  Service: General;  Laterality: N/A;  . NOSE SURGERY  2013  . SHOULDER SURGERY     left    Soc Hx - married 53 years. Two daughters, 1 son, 5 grandchildren, 3 great-grandchildren. 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 His wife is covid + but asymptomatic.    reports that he quit smoking about 53 years ago. His smoking use included cigarettes. He started smoking about 61 years ago. He has a 2.75 pack-year smoking history. He has never used smokeless tobacco. He reports current alcohol use. He reports that he does not use drugs.  Allergies  Allergen Reactions  . Dexlansoprazole Nausea And Vomiting  . Celecoxib Other (See Comments)    Solid white - generic pill only = caused  diarrhea (other generics ok)  Solid white - generic pill only = caused diarrhea (other generics ok)     Family History  Problem Relation Age of Onset  . Heart disease Mother   . Diabetes Mother   . Hypertension Mother   . Obesity Mother   . Heart disease Father   . Hypertension Father   . Alcoholism Father   . Obesity Father   . Heart disease Sister   . Arthritis Sister   . Diabetes Brother   . Hypertension Brother   . Heart attack Brother        enlarged heart  . Liver cancer Paternal Grandfather   . Throat cancer Maternal Grandfather   . Hemachromatosis Daughter   . Arthritis Daughter   . GI problems Daughter   . Thyroid disease Daughter   . Hypertension Daughter   . Heart disease Paternal Grandmother   . Thyroid disease Daughter        pituitary tumor  . Migraines Daughter   . Colon cancer Neg Hx   . Esophageal cancer Neg Hx   . Rectal cancer Neg Hx   . Stomach cancer Neg Hx       Prior to Admission medications   Medication Sig Start Date End Date Taking? Authorizing Provider  alfuzosin (UROXATRAL) 10 MG 24 hr tablet Take 10 mg by mouth every morning.    Yes [provider]  Ascorbic Acid (VITAMIN C) 100 MG tablet Take 100 mg by mouth daily.   Yes [provider]  aspirin 81 MG tablet Take 81 mg by mouth 2 (two) times daily.    Yes [provider]  Black Elderberry 50 MG/5ML SYRP Take by mouth daily. 2 tablespoons.   Yes [provider]  cetirizine (ZYRTEC) 10 MG tablet Take 10 mg by mouth daily.   Yes [provider]  Coenzyme Q10 (CO Q 10 PO) Take 1 capsule by mouth daily.    Yes [provider]  cyclobenzaprine (FLEXERIL) 10 MG tablet TAKE ONE TABLET BY MOUTH THREE TIMES DAILY AS NEEDED FOR MUSCLE SPASMS Patient taking differently: Take 10 mg by mouth 3 (three) times daily as needed for muscle spasms.  10/22/19  Yes Dettinger, Fransisca Kaufmann, MD  Esomeprazole Magnesium (NEXIUM 24HR) 20 MG TBEC Take 20 mg by mouth  daily as needed.    Yes [provider]  ferrous sulfate 325 (65 FE) MG tablet Take 1 tablet (325 mg total) by mouth daily with breakfast. 09/20/18  Yes [name], MD  Glucosamine-Chondroit-Vit C-Mn (GLUCOSAMINE CHONDR 1500 COMPLX) CAPS Take 1 capsule by mouth daily.    Yes [provider]  Ipratropium-Albuterol (COMBIVENT) 20-100 MCG/ACT AERS respimat Inhale 1 puff into the lungs as  needed for wheezing or shortness of breath. 09/20/18  Yes [name], MD  losartan (COZAAR) 50 MG tablet Take 1 tablet (50 mg total) by mouth daily. Patient taking differently: Take 25 mg by mouth daily.  09/20/18  Yes [name], MD  montelukast (SINGULAIR) 10 MG tablet TAKE ONE TABLET BY MOUTH AT BEDTIME 10/30/19  Yes Dettinger, Fransisca Kaufmann, MD  Multiple Vitamins-Minerals (CENTRUM SILVER ULTRA MENS) TABS Take 1 tablet by mouth every morning. Once a day   Yes [provider]  Probiotic Product ([place]) Take 1 capsule by mouth daily.    Yes [provider]  pyridOXINE (VITAMIN B-6) 100 MG tablet Take 100 mg by mouth daily.    Yes [provider]  rosuvastatin (CRESTOR) 10 MG tablet TAKE ONE TABLET BY MOUTH DAILY 08/30/19  Yes Dettinger, Fransisca Kaufmann, MD  Turmeric 500 MG CAPS Take 1 tablet by mouth daily.    Yes [provider]  vitamin B-12 (CYANOCOBALAMIN) 500 MCG tablet Take 500 mcg by mouth daily.   Yes [provider]  Zinc 50 MG TABS Take by mouth daily.   Yes [provider]  sildenafil (REVATIO) 20 MG tablet Take 2-5 tabs prior to sexual activity Patient taking differently: Take 20 mg by mouth 3 (three) times daily as needed (For sexual activity).  12/31/15   [name], MD  Vitamin D, Ergocalciferol, (DRISDOL) 1.25 MG (50000 UNIT) CAPS capsule Take 1 capsule (50,000 Units total) by mouth every 7 (seven) days. 11/26/19   Mellody Dance, DO    Physical Exam: Vitals:   12/23/2019 2047 12/19/2019 2100 12/30/2019 2135  12/20/2019 2200  BP: 124/76 132/64 (!) 151/74 134/71  Pulse: (!) 106 (!) 111 (!) 101 (!) 101  Resp: 18 (!) 21 (!) 23 20  Temp:      TempSrc:      SpO2: 92% (!) 88% 92% 90%  Weight:      Height:         Vitals:   01/01/2020 2047 01/07/2020 2100 01/07/2020 2135 12/31/2019 2200  BP: 124/76 132/64 (!) 151/74 134/71  Pulse: (!) 106 (!) 111 (!) 101 (!) 101  Resp: 18 (!) 21 (!) 23 20  Temp:      TempSrc:      SpO2: 92% (!) 88% 92% 90%  Weight:      Height:       General: WNWD man in no distress Eyes: PERRL, lids and conjunctivae normal ENMT: Mucous membranes are moist. Posterior pharynx clear of any exudate or lesions.Normal dentition.  Neck: normal, supple, no masses, no thyromegaly Respiratory: no increased WOB. Ausculatation with feint rales at base R>L, no wheezing Cardiovascular: Regular rate and rhythm, no murmurs / rubs / gallops. No extremity edema. 2+ pedal pulses. No carotid bruits.  Abdomen: verweight no tenderness, no masses palpated. No hepatosplenomegaly. Bowel sounds positive.  Musculoskeletal: no clubbing / cyanosis. No joint deformity upper and lower extremities. Good ROM, no contractures. Normal muscle tone.  Skin: no rashes, lesions, ulcers. No induration Neurologic: CN 2-12 grossly intact. Sensation intact, DTR normal. Strength 5/5 in all 4.  Psychiatric: Normal judgment and insight. Alert and oriented x 3. Normal mood.     Labs on Admission: I have personally reviewed following labs and imaging studies  CBC: Recent Labs  Lab 12/29/2019 1800  WBC 2.4*  NEUTROABS 1.9  HGB 14.8  HCT 44.7  MCV 90.7  PLT 84*   Basic Metabolic Panel: Recent Labs  Lab  01/01/2020 1800  NA 135  K 3.9  CL 100  CO2 25  GLUCOSE 135*  BUN 29*  CREATININE 1.29*  CALCIUM 8.4*   GFR: Estimated Creatinine Clearance: 59.3 mL/min (A) (by C-G formula based on SCr of 1.29 mg/dL (H)). Liver Function Tests: Recent Labs  Lab 12/19/2019 1800  AST 53*  ALT 31  ALKPHOS 63  BILITOT 0.7   PROT 6.4*  ALBUMIN 3.7   No results for input(s): LIPASE, AMYLASE in the last 168 hours. No results for input(s): AMMONIA in the last 168 hours. Coagulation Profile: No results for input(s): INR, PROTIME in the last 168 hours. Cardiac Enzymes: No results for input(s): CKTOTAL, CKMB, CKMBINDEX, TROPONINI in the last 168 hours. BNP (last 3 results) No results for input(s): PROBNP in the last 8760 hours. HbA1C: No results for input(s): HGBA1C in the last 72 hours. CBG: No results for input(s): GLUCAP in the last 168 hours. Lipid Profile: Recent Labs    01/05/2020 1800  TRIG 75   Thyroid Function Tests: No results for input(s): TSH, T4TOTAL, FREET4, T3FREE, THYROIDAB in the last 72 hours. Anemia Panel: Recent Labs    12/24/2019 1800  FERRITIN 321   Urine analysis: No results found for: COLORURINE, APPEARANCEUR, LABSPEC, PHURINE, GLUCOSEU, HGBUR, BILIRUBINUR, KETONESUR, PROTEINUR, UROBILINOGEN, NITRITE, LEUKOCYTESUR  Radiological Exams on Admission: DG Chest Port 1 View  Result Date: 12/23/2019 CLINICAL DATA:  COVID-19, worsening shortness of breath and oxygen requirement after monoclonal antibody infusion EXAM: PORTABLE CHEST 1 VIEW COMPARISON:  07/02/2016 FINDINGS: Single frontal view of the chest demonstrates an enlarged cardiac silhouette. There is basilar predominant interstitial and ground-glass opacities, which could reflect edema or multifocal pneumonia. No effusion or pneumothorax. No acute bony abnormalities. IMPRESSION: 1. Basilar predominant interstitial and ground-glass opacities, which could reflect multifocal pneumonia or edema. Electronically Signed   By: Randa Ngo [name]   On: 01/09/2020 17:01    EKG: Independently reviewed. No EKG done in ED  Assessment/Plan Active Problems:   Pneumonia due to COVID-19 virus   Essential hypertension, benign   Hyperlipidemia LDL goal <100   OSA (obstructive sleep apnea)   BPH (benign prostatic hyperplasia)     1. Covid  pneumonia - patient with moderate hypoxemia, elevated inflammatory markers and abnormal CXR. He is not vaccinated. He did receive monoclonal antibody infusion earlier today. Plan Med-surg admit  Continue remedesivir x 5 days  Solumedrol IV bid x 3 days then oral steroids  O2 to keep oxygen level >92%  Standard care  2. HTN- continue home regimen  3. HLD - continue home medication  4. BPH - continue home meds.  5. OSA - will hold CPAP for now  DVT prophylaxis: lovenox  Code Status: full code  Family Communication: Spoke with Veleta Miners. After talking with her I confirmed with Connor [name] that 1. He is willing to take remdesivir and 2. If he needed to be intubated and ventilated he is willing to do that.   Disposition Plan: TBD Consults called: none  Admission status: inpatient    Adella Hare MD [facility] Pager [phone]  If 7PM-7AM, please contact night-coverage [email or web address] Password [facility]  12/26/2019, 11:20 PM

## 2020-01-02 NOTE — Discharge Instructions (Signed)

## 2020-01-03 ENCOUNTER — Inpatient Hospital Stay (HOSPITAL_COMMUNITY): Payer: Medicare Other

## 2020-01-03 LAB — CBC WITH DIFFERENTIAL/PLATELET
Abs Immature Granulocytes: 0.01 10*3/uL (ref 0.00–0.07)
Basophils Absolute: 0 10*3/uL (ref 0.0–0.1)
Basophils Relative: 0 %
Eosinophils Absolute: 0 10*3/uL (ref 0.0–0.5)
Eosinophils Relative: 0 %
HCT: 43.1 % (ref 39.0–52.0)
Hemoglobin: 14.4 g/dL (ref 13.0–17.0)
Immature Granulocytes: 0 %
Lymphocytes Relative: 9 %
Lymphs Abs: 0.3 10*3/uL — ABNORMAL LOW (ref 0.7–4.0)
MCH: 30.3 pg (ref 26.0–34.0)
MCHC: 33.4 g/dL (ref 30.0–36.0)
MCV: 90.7 fL (ref 80.0–100.0)
Monocytes Absolute: 0.1 10*3/uL (ref 0.1–1.0)
Monocytes Relative: 4 %
Neutro Abs: 2.8 10*3/uL (ref 1.7–7.7)
Neutrophils Relative %: 87 %
Platelets: 84 10*3/uL — ABNORMAL LOW (ref 150–400)
RBC: 4.75 MIL/uL (ref 4.22–5.81)
RDW: 12.4 % (ref 11.5–15.5)
WBC: 3.2 10*3/uL — ABNORMAL LOW (ref 4.0–10.5)
nRBC: 0 % (ref 0.0–0.2)

## 2020-01-03 LAB — C-REACTIVE PROTEIN: CRP: 9.6 mg/dL — ABNORMAL HIGH (ref <1.0)

## 2020-01-03 LAB — D-DIMER, QUANTITATIVE: D-Dimer, Quant: 0.69 ug/mL-FEU — ABNORMAL HIGH (ref 0.00–0.50)

## 2020-01-03 MED ORDER — IOHEXOL 350 MG/ML SOLN
100.0000 mL | Freq: Once | INTRAVENOUS | Status: AC | PRN
  Administered 2020-01-03: 100 mL via INTRAVENOUS

## 2020-01-03 MED ORDER — SODIUM CHLORIDE 0.9 % IV SOLN: INTRAVENOUS | Status: DC | PRN

## 2020-01-03 NOTE — Progress Notes (Signed)
Pt arrived to the unit by stretcher from the ED. Pt able to move transfer self with minimal assistance.

## 2020-01-03 NOTE — ED Notes (Signed)
Increase high flow to 15L due to O2 stats being 86-88%.

## 2020-01-03 NOTE — ED Notes (Signed)
Patient placed on high flow at 10L. Per MD.

## 2020-01-03 NOTE — Progress Notes (Signed)
Pt unable to utilize CPAP QHS due to not being in negative pressure room and due to O2 demands.  RT will follow up once pt is in assigned room on floor.

## 2020-01-03 NOTE — Progress Notes (Signed)
PROGRESS NOTE    Connor [name]  UYQ:[id number] DOB: 09/05/1945 DOA: 01/07/2020 PCP: Dettinger, Fransisca Kaufmann, MD     Brief Narrative:  Connor [name] is a 74 y.o. male with medical history significant of Polycythemia Vera, BPH, mild COPD, carotid artery disease was diagnosed as COVID positive on 12/27/19. He had mild symptoms. He was scheduled for and did receive monoclonal antibody infusion 12/17/2019. After treatment his O2 sat was 85%. [facility] was [facility] to [facility] for evaluation and admission. He is unvaccinated. CXR with mild multifocal ground glass infiltrates.   New events last 24 hours / Subjective: Continues to have shortness of breath, requiring nasal cannula O2  Assessment & Plan:   Active Problems:   Essential hypertension, benign   OSA (obstructive sleep apnea)   BPH (benign prostatic hyperplasia)   Hyperlipidemia LDL goal <100   Pneumonia due to COVID-19 virus   Acute hypoxemic respiratory failure secondary to COVID-19 -Remains on 6 L nasal cannula O2 currently, continue to wean as able  COVID-19 -Tested positive on 9/16.  Status post monoclonal antibody infusion 9/22 -Continue remdesivir, Solu-Medrol -D-dimer elevated this morning, check CTA chest to rule out PE   COVID-19 Labs  Recent Labs    12/26/2019 1800 01/03/20 0500 01/03/20 0940  DDIMER 0.41  --  0.69*  FERRITIN 321  --   --   LDH 239*  --   --   CRP 8.1* 9.6*  --    Essential hypertension -Hold cozaar  Hyperlipidemia -Continue crestor   BPH -Continue alfuzosin   OSA -CPAP qhs    DVT prophylaxis:  enoxaparin (LOVENOX) injection 40 mg Start: 01/03/20 1000  Code Status: Full Family Communication: None at bedside, patient declined for me to update anyone today  Disposition Plan:  Status is: Inpatient  Remains inpatient appropriate because:Inpatient level of care appropriate due to severity of illness   Dispo: The patient is from: Home              Anticipated d/c is to: Home               Anticipated d/c date is: 2 days              Patient currently is not medically stable to d/c.  Continue medical treatment for COVID-19.  Remains on 6 L nasal cannula O2, elevating CRP levels   Consultants:   None  Procedures:   None  Antimicrobials:  Anti-infectives (From admission, onward)   Start     Dose/Rate Route Frequency Ordered Stop   01/03/20 1600  remdesivir 100 mg in sodium chloride 0.9 % 100 mL IVPB       "Followed by" Linked Group Details   100 mg 200 mL/hr over 30 Minutes Intravenous Daily 12/28/2019 2111 01/07/20 0959   12/14/2019 2200  remdesivir 200 mg in sodium chloride 0.9% 250 mL IVPB       "Followed by" Linked Group Details   200 mg 580 mL/hr over 30 Minutes Intravenous Once 12/26/2019 2111 12/28/2019 2300        Objective: Vitals:   01/03/20 0600 01/03/20 0905 01/03/20 1000 01/03/20 1201  BP: (!) 129/56 (!) 141/76 107/71 (!) 117/49  Pulse: 83 97 99 92  Resp: (!) 21 (!) 28 (!) 30 12  Temp:      TempSrc:      SpO2: 91% 90% 92% (!) 87%  Weight:      Height:       No intake or output data in  the 24 hours ending 01/03/20 1203 Filed Weights   12/21/2019 1626  Weight: 102.1 kg    Examination:  General exam: Appears calm and comfortable  Respiratory system: Clear to auscultation anteriorly. Respiratory effort normal. +cough, +[place] O2  Cardiovascular system: S1 & S2 heard, RRR. No murmurs. No pedal edema. Gastrointestinal system: Abdomen is nondistended, soft and nontender. Normal bowel sounds heard. Central nervous system: Alert and oriented. No focal neurological deficits. Speech clear.  Extremities: Symmetric in appearance  Skin: No rashes, lesions or ulcers on exposed skin  Psychiatry: Judgement and insight appear normal. Mood & affect appropriate.   Data Reviewed: I have personally reviewed following labs and imaging studies  CBC: Recent Labs  Lab 12/16/2019 1800 01/03/20 0500  WBC 2.4* 3.2*  NEUTROABS 1.9 2.8  HGB 14.8 14.4  HCT 44.7 43.1  MCV  90.7 90.7  PLT 84* 84*   Basic Metabolic Panel: Recent Labs  Lab 12/27/2019 1800  NA 135  K 3.9  CL 100  CO2 25  GLUCOSE 135*  BUN 29*  CREATININE 1.29*  CALCIUM 8.4*   GFR: Estimated Creatinine Clearance: 59.3 mL/min (A) (by C-G formula based on SCr of 1.29 mg/dL (H)). Liver Function Tests: Recent Labs  Lab 12/12/2019 1800  AST 53*  ALT 31  ALKPHOS 63  BILITOT 0.7  PROT 6.4*  ALBUMIN 3.7   No results for input(s): LIPASE, AMYLASE in the last 168 hours. No results for input(s): AMMONIA in the last 168 hours. Coagulation Profile: No results for input(s): INR, PROTIME in the last 168 hours. Cardiac Enzymes: No results for input(s): CKTOTAL, CKMB, CKMBINDEX, TROPONINI in the last 168 hours. BNP (last 3 results) No results for input(s): PROBNP in the last 8760 hours. HbA1C: No results for input(s): HGBA1C in the last 72 hours. CBG: No results for input(s): GLUCAP in the last 168 hours. Lipid Profile: Recent Labs    12/25/2019 1800  TRIG 75   Thyroid Function Tests: No results for input(s): TSH, T4TOTAL, FREET4, T3FREE, THYROIDAB in the last 72 hours. Anemia Panel: Recent Labs    01/01/2020 1800  FERRITIN 321   Sepsis Labs: Recent Labs  Lab 12/26/2019 1800  PROCALCITON <0.10  LATICACIDVEN 0.8    No results found for this or any previous visit (from the past 240 hour(s)).    Radiology Studies: DG Chest Port 1 View  Result Date: 01/09/2020 CLINICAL DATA:  COVID-19, worsening shortness of breath and oxygen requirement after monoclonal antibody infusion EXAM: PORTABLE CHEST 1 VIEW COMPARISON:  07/02/2016 FINDINGS: Single frontal view of the chest demonstrates an enlarged cardiac silhouette. There is basilar predominant interstitial and ground-glass opacities, which could reflect edema or multifocal pneumonia. No effusion or pneumothorax. No acute bony abnormalities. IMPRESSION: 1. Basilar predominant interstitial and ground-glass opacities, which could reflect  multifocal pneumonia or edema. Electronically Signed   By: Randa Ngo [name]   On: 01/03/2020 17:01      Scheduled Meds:  alfuzosin  10 mg Oral q morning - 10a   enoxaparin (LOVENOX) injection  40 mg Subcutaneous Q24H   ferrous sulfate  325 mg Oral Q breakfast   loratadine  10 mg Oral Daily   losartan  25 mg Oral Daily   methylPREDNISolone (SOLU-MEDROL) injection  50 mg Intravenous Q12H   Followed by   Derrill Memo ON 01/06/2020] predniSONE  50 mg Oral Daily   montelukast  10 mg Oral QHS   rosuvastatin  10 mg Oral Daily   senna  1 tablet Oral BID  vitamin B-12  500 mcg Oral Daily   Continuous Infusions:  sodium chloride 1,000 mL (01/03/20 0951)   remdesivir 100 mg in NS 100 mL       LOS: 1 day      Time spent: 45 minutes   Dessa Phi, DO [facility] 01/03/2020, 12:03 PM   Available via Epic secure chat 7am-7pm After these hours, please refer to coverage provider listed on [email or web address]

## 2020-01-04 LAB — CBC WITH DIFFERENTIAL/PLATELET
Abs Immature Granulocytes: 0.03 10*3/uL (ref 0.00–0.07)
Basophils Absolute: 0 10*3/uL (ref 0.0–0.1)
Basophils Relative: 0 %
Eosinophils Absolute: 0 10*3/uL (ref 0.0–0.5)
Eosinophils Relative: 0 %
HCT: 45.7 % (ref 39.0–52.0)
Hemoglobin: 15 g/dL (ref 13.0–17.0)
Immature Granulocytes: 1 %
Lymphocytes Relative: 8 %
Lymphs Abs: 0.4 10*3/uL — ABNORMAL LOW (ref 0.7–4.0)
MCH: 29.8 pg (ref 26.0–34.0)
MCHC: 32.8 g/dL (ref 30.0–36.0)
MCV: 90.7 fL (ref 80.0–100.0)
Monocytes Absolute: 0.2 10*3/uL (ref 0.1–1.0)
Monocytes Relative: 5 %
Neutro Abs: 4.4 10*3/uL (ref 1.7–7.7)
Neutrophils Relative %: 86 %
Platelets: 112 10*3/uL — ABNORMAL LOW (ref 150–400)
RBC: 5.04 MIL/uL (ref 4.22–5.81)
RDW: 12.6 % (ref 11.5–15.5)
WBC: 5 10*3/uL (ref 4.0–10.5)
nRBC: 0 % (ref 0.0–0.2)

## 2020-01-04 LAB — FERRITIN: Ferritin: 926 ng/mL — ABNORMAL HIGH (ref 24–336)

## 2020-01-04 LAB — COMPREHENSIVE METABOLIC PANEL
ALT: 28 U/L (ref 0–44)
AST: 56 U/L — ABNORMAL HIGH (ref 15–41)
Albumin: 3 g/dL — ABNORMAL LOW (ref 3.5–5.0)
Alkaline Phosphatase: 70 U/L (ref 38–126)
Anion gap: 8 (ref 5–15)
BUN: 28 mg/dL — ABNORMAL HIGH (ref 8–23)
CO2: 23 mmol/L (ref 22–32)
Calcium: 8.3 mg/dL — ABNORMAL LOW (ref 8.9–10.3)
Chloride: 110 mmol/L (ref 98–111)
Creatinine, Ser: 0.93 mg/dL (ref 0.61–1.24)
GFR calc Af Amer: >60 mL/min (ref >60)
GFR calc non Af Amer: >60 mL/min (ref >60)
Glucose, Bld: 221 mg/dL — ABNORMAL HIGH (ref 70–99)
Potassium: 4.4 mmol/L (ref 3.5–5.1)
Sodium: 141 mmol/L (ref 135–145)
Total Bilirubin: 0.6 mg/dL (ref 0.3–1.2)
Total Protein: 6 g/dL — ABNORMAL LOW (ref 6.5–8.1)

## 2020-01-04 LAB — D-DIMER, QUANTITATIVE: D-Dimer, Quant: 0.58 ug/mL-FEU — ABNORMAL HIGH (ref 0.00–0.50)

## 2020-01-04 LAB — C-REACTIVE PROTEIN: CRP: 6.9 mg/dL — ABNORMAL HIGH (ref <1.0)

## 2020-01-04 MED ORDER — TRAZODONE HCL 50 MG PO TABS
50.0000 mg | ORAL_TABLET | Freq: Once | ORAL | Status: AC
  Administered 2020-01-04: 50 mg via ORAL
  Filled 2020-01-04: qty 1

## 2020-01-04 MED ORDER — BARICITINIB 2 MG PO TABS
4.0000 mg | ORAL_TABLET | Freq: Every day | ORAL | Status: AC
  Administered 2020-01-04 – 2020-01-17 (×14): 4 mg via ORAL
  Filled 2020-01-04 (×14): qty 2

## 2020-01-04 NOTE — Progress Notes (Signed)
Pt wants to hold off on CPAP for tonight.  Pt remains on 15 LPM [place], RT to monitor and assess as needed.

## 2020-01-04 NOTE — Progress Notes (Signed)
PROGRESS NOTE    Connor [name]  WFU:[id number] DOB: 76-11-1945 DOA: 01/09/2020 PCP: Dettinger, Fransisca Kaufmann, MD     Brief Narrative:  Connor [name] is a 74 y.o. male with medical history significant of Polycythemia Vera, BPH, mild COPD, carotid artery disease was diagnosed as COVID positive on 12/27/19. He had mild symptoms. He was scheduled for and did receive monoclonal antibody infusion 12/25/2019. After treatment his O2 sat was 85%. He was referred to [facility] for evaluation and admission. He is unvaccinated. CXR with mild multifocal ground glass infiltrates.   New events last 24 hours / Subjective: Patient is oxygen requirements continued to increase last night, currently on 15 L high flow nasal cannula O2.  Assessment & Plan:   Principal Problem:   Pneumonia due to COVID-19 virus Active Problems:   Essential hypertension, benign   OSA (obstructive sleep apnea)   BPH (benign prostatic hyperplasia)   Hyperlipidemia LDL goal <100   Acute hypoxemic respiratory failure secondary to COVID-19 -Remains on 15 L high flow nasal cannula O2 currently, continue to wean as able  COVID-19 -Tested positive on 9/16.  Status post monoclonal antibody infusion 9/22 -Continue remdesivir, Solu-Medrol -CTA chest negative for PE -Discussed with patient on starting baricitinib due to increase in oxygen requirements.  We discussed benefits and risk.  He is agreeable to start this medication which is under FDA EUA.   COVID-19 Labs  Recent Labs    12/26/2019 1800 01/03/20 0500 01/03/20 0940 01/04/20 0423  DDIMER 0.41  --  0.69* 0.58*  FERRITIN 321  --   --  926*  LDH 239*  --   --   --   CRP 8.1* 9.6*  --  6.9*   Essential hypertension -Hold cozaar  Hyperlipidemia -Continue crestor   BPH -Continue alfuzosin   OSA -CPAP qhs    DVT prophylaxis:  enoxaparin (LOVENOX) injection 40 mg Start: 01/03/20 1000  Code Status: Full Family Communication: Updated wife over the phone today   Disposition Plan:  Status is: Inpatient  Remains inpatient appropriate because:Inpatient level of care appropriate due to severity of illness   Dispo: The patient is from: Home              Anticipated d/c is to: Home              Anticipated d/c date is: 2 days              Patient currently is not medically stable to d/c.  Continue medical treatment for COVID-19.  Remains on 15 L high flow nasal cannula O2  Consultants:   None  Procedures:   None  Antimicrobials:  Anti-infectives (From admission, onward)   Start     Dose/Rate Route Frequency Ordered Stop   01/03/20 1600  remdesivir 100 mg in sodium chloride 0.9 % 100 mL IVPB       "Followed by" Linked Group Details   100 mg 200 mL/hr over 30 Minutes Intravenous Daily 01/06/2020 2111 01/07/20 0959   12/21/2019 2200  remdesivir 200 mg in sodium chloride 0.9% 250 mL IVPB       "Followed by" Linked Group Details   200 mg 580 mL/hr over 30 Minutes Intravenous Once 01/05/2020 2111 12/15/2019 2300       Objective: Vitals:   01/04/20 0100 01/04/20 0258 01/04/20 0618 01/04/20 1030  BP:  130/61 (!) 108/58 (!) 143/72  Pulse:  86 86 97  Resp: (!) 22 20 20 17   Temp:  98.2 F (  36.8 C) 98.2 F (36.8 C) 98.5 F (36.9 C)  TempSrc:    Oral  SpO2:  98% 98% 93%  Weight:      Height:        Intake/Output Summary (Last 24 hours) at 01/04/2020 1108 Last data filed at 01/04/2020 0300 Gross per 24 hour  Intake 100 ml  Output 250 ml  Net -150 ml   Filed Weights   01/05/2020 1626  Weight: 102.1 kg   Examination: General exam: Appears calm and comfortable  Respiratory system: Clear to auscultation. Respiratory effort normal.  No conversational dyspnea.  On high flow nasal cannula O2 Cardiovascular system: S1 & S2 heard, RRR. No pedal edema. Gastrointestinal system: Abdomen is nondistended, soft and nontender. Normal bowel sounds heard. Central nervous system: Alert and oriented. Non focal exam. Speech clear  Extremities: Symmetric in  appearance bilaterally  Skin: No rashes, lesions or ulcers on exposed skin  Psychiatry: Judgement and insight appear stable. Mood & affect appropriate.    Data Reviewed: I have personally reviewed following labs and imaging studies  CBC: Recent Labs  Lab 12/26/2019 1800 01/03/20 0500 01/04/20 0423  WBC 2.4* 3.2* 5.0  NEUTROABS 1.9 2.8 4.4  HGB 14.8 14.4 15.0  HCT 44.7 43.1 45.7  MCV 90.7 90.7 90.7  PLT 84* 84* 952*   Basic Metabolic Panel: Recent Labs  Lab 12/27/2019 1800 01/04/20 0423  NA 135 141  K 3.9 4.4  CL 100 110  CO2 25 23  GLUCOSE 135* 221*  BUN 29* 28*  CREATININE 1.29* 0.93  CALCIUM 8.4* 8.3*   GFR: Estimated Creatinine Clearance: 82.2 mL/min (by C-G formula based on SCr of 0.93 mg/dL). Liver Function Tests: Recent Labs  Lab 01/10/2020 1800 01/04/20 0423  AST 53* 56*  ALT 31 28  ALKPHOS 63 70  BILITOT 0.7 0.6  PROT 6.4* 6.0*  ALBUMIN 3.7 3.0*   No results for input(s): LIPASE, AMYLASE in the last 168 hours. No results for input(s): AMMONIA in the last 168 hours. Coagulation Profile: No results for input(s): INR, PROTIME in the last 168 hours. Cardiac Enzymes: No results for input(s): CKTOTAL, CKMB, CKMBINDEX, TROPONINI in the last 168 hours. BNP (last 3 results) No results for input(s): PROBNP in the last 8760 hours. HbA1C: No results for input(s): HGBA1C in the last 72 hours. CBG: No results for input(s): GLUCAP in the last 168 hours. Lipid Profile: Recent Labs    12/12/2019 1800  TRIG 75   Thyroid Function Tests: No results for input(s): TSH, T4TOTAL, FREET4, T3FREE, THYROIDAB in the last 72 hours. Anemia Panel: Recent Labs    12/29/2019 1800 01/04/20 0423  FERRITIN 321 926*   Sepsis Labs: Recent Labs  Lab 12/21/2019 1800  PROCALCITON <0.10  LATICACIDVEN 0.8    Recent Results (from the past 240 hour(s))  Blood Culture (routine x 2)     Status: None (Preliminary result)   Collection Time: 12/29/2019  6:00 PM   Specimen: Site Not  Specified; Blood  Result Value Ref Range Status   Specimen Description   Final    SITE NOT SPECIFIED Performed at [facility] [address] [place], [place] [address]    Special Requests   Final    BOTTLES DRAWN AEROBIC AND ANAEROBIC Blood Culture results may not be optimal due to an inadequate volume of blood received in culture bottles Performed at [facility] [address] [place], [place] [address]    Culture   Final  NO GROWTH < 24 HOURS Performed at [facility] [address], [place], [place] [address]    Report Status PENDING  Incomplete      Radiology Studies: CT ANGIO CHEST PE W OR WO CONTRAST  Result Date: 01/03/2020 CLINICAL DATA:  Polycythemia, recent COVID diagnosis, post monoclonal antibody infusion. Low O2 sats. EXAM: CT ANGIOGRAPHY CHEST WITH CONTRAST TECHNIQUE: Multidetector CT imaging of the chest was performed using the standard protocol during bolus administration of intravenous contrast. Multiplanar CT image reconstructions and MIPs were obtained to evaluate the vascular anatomy. Repeat injection was required because of poor enhancement on the first pass. CONTRAST:  135 mL OMNIPAQUE IOHEXOL 350 MG/ML SOLN COMPARISON:  06/06/2009 and previous FINDINGS: Cardiovascular: Heart size normal. No pericardial effusion. Satisfactory opacification of pulmonary arteries noted, and there is no evidence of pulmonary emboli. Adequate contrast opacification of the thoracic aorta with no evidence of dissection, aneurysm, or stenosis. There is classic 3-vessel brachiocephalic arch anatomy without proximal stenosis. Scattered calcified atheromatous plaque in the aortic isthmus. Mediastinum/Nodes: No mass or adenopathy. Lungs/Pleura: No pleural effusion. No pneumothorax. Extensive ground-glass and scattered airspace opacities throughout both lungs, involving bases more than apices, with a largely peripheral distribution. Upper Abdomen: No  acute findings. Stable small hepatic cysts since 07/17/2008. Musculoskeletal: Anterior vertebral endplate spurring at multiple levels in the mid and lower thoracic spine. Review of the MIP images confirms the above findings. IMPRESSION: 1. Negative for acute PE or thoracic aortic dissection. 2. Extensive ground-glass and scattered airspace opacities throughout both lungs, involving bases more than apices, with a largely peripheral distribution. Imaging features can be seen with (COVID-19 or viral) pneumonia, though are nonspecific and can occur with a variety of infectious and noninfectious processes. Aortic Atherosclerosis (ICD10-I70.0). Electronically Signed   By: Lucrezia Europe [name]   On: 01/03/2020 15:05   DG Chest Port 1 View  Result Date: 12/22/2019 CLINICAL DATA:  COVID-19, worsening shortness of breath and oxygen requirement after monoclonal antibody infusion EXAM: PORTABLE CHEST 1 VIEW COMPARISON:  07/02/2016 FINDINGS: Single frontal view of the chest demonstrates an enlarged cardiac silhouette. There is basilar predominant interstitial and ground-glass opacities, which could reflect edema or multifocal pneumonia. No effusion or pneumothorax. No acute bony abnormalities. IMPRESSION: 1. Basilar predominant interstitial and ground-glass opacities, which could reflect multifocal pneumonia or edema. Electronically Signed   By: Randa Ngo [name]   On: 12/19/2019 17:01      Scheduled Meds: . alfuzosin  10 mg Oral q morning - 10a  . baricitinib  4 mg Oral Daily  . enoxaparin (LOVENOX) injection  40 mg Subcutaneous Q24H  . ferrous sulfate  325 mg Oral Q breakfast  . loratadine  10 mg Oral Daily  . methylPREDNISolone (SOLU-MEDROL) injection  50 mg Intravenous Q12H   Followed by  . [START ON 01/06/2020] predniSONE  50 mg Oral Daily  . montelukast  10 mg Oral QHS  . rosuvastatin  10 mg Oral Daily  . senna  1 tablet Oral BID  . vitamin B-12  500 mcg Oral Daily   Continuous Infusions: . sodium  chloride    . remdesivir 100 mg in NS 100 mL 100 mg (01/04/20 0850)     LOS: 2 days      Time spent: 45 minutes   Dessa Phi, DO [facility] 01/04/2020, 11:08 AM   Available via Epic secure chat 7am-7pm After these hours, please refer to coverage provider listed on [email or web address]

## 2020-01-04 NOTE — Progress Notes (Signed)
   01/04/20 0057  Assess: MEWS Score  Pulse Rate 89  Resp (!) 26  SpO2 94 %  Assess: MEWS Score  MEWS Temp 0  MEWS Systolic 0  MEWS Pulse 0  MEWS RR 2  MEWS LOC 0  MEWS Score 2  MEWS Score Color Yellow  Assess: if the MEWS score is Yellow or Red  Were vital signs taken at a resting state? No (pt anxiously awaiting for resp to put on c-pap)  Focused Assessment No change from prior assessment  Early Detection of Sepsis Score *See Row Information* Low  MEWS guidelines implemented *See Row Information* No, vital signs rechecked  Treat  MEWS Interventions  (C-pap is on)  Notify: Charge Nurse/RN  Name of Charge Nurse/RN Notified [place]   Date Charge Nurse/RN Notified 01/04/20  Time Charge Nurse/RN Notified 0100  Document  Patient Outcome Stabilized after interventions  Progress note created (see row info) Yes   Pt was anxiously awaiting for respiratory to apply C-Pap. Resp of 26 cause yellow mews and was noted by respiratory therapist. After pt was put on C-Pap, pt is resting with resp of 22 and now green mews is noted by this nurse. No distress noted. Jackelyn Poling, Camera operator notified. Yellow mews guideline will not be implemented at this time, monitoring of VS will continue.

## 2020-01-04 NOTE — Plan of Care (Signed)
Pt admitted with new diagnosis of Covid 19. Educated on O2 needs, Flutter & IS use, Prone positioning

## 2020-01-05 LAB — COMPREHENSIVE METABOLIC PANEL
ALT: 32 U/L (ref 0–44)
AST: 56 U/L — ABNORMAL HIGH (ref 15–41)
Albumin: 2.9 g/dL — ABNORMAL LOW (ref 3.5–5.0)
Alkaline Phosphatase: 64 U/L (ref 38–126)
Anion gap: 9 (ref 5–15)
BUN: 33 mg/dL — ABNORMAL HIGH (ref 8–23)
CO2: 27 mmol/L (ref 22–32)
Calcium: 8.6 mg/dL — ABNORMAL LOW (ref 8.9–10.3)
Chloride: 107 mmol/L (ref 98–111)
Creatinine, Ser: 0.91 mg/dL (ref 0.61–1.24)
GFR calc Af Amer: >60 mL/min (ref >60)
GFR calc non Af Amer: >60 mL/min (ref >60)
Glucose, Bld: 209 mg/dL — ABNORMAL HIGH (ref 70–99)
Potassium: 4.3 mmol/L (ref 3.5–5.1)
Sodium: 143 mmol/L (ref 135–145)
Total Bilirubin: 0.5 mg/dL (ref 0.3–1.2)
Total Protein: 5.9 g/dL — ABNORMAL LOW (ref 6.5–8.1)

## 2020-01-05 LAB — CBC WITH DIFFERENTIAL/PLATELET
Abs Immature Granulocytes: 0.06 10*3/uL (ref 0.00–0.07)
Basophils Absolute: 0 10*3/uL (ref 0.0–0.1)
Basophils Relative: 0 %
Eosinophils Absolute: 0 10*3/uL (ref 0.0–0.5)
Eosinophils Relative: 0 %
HCT: 47.2 % (ref 39.0–52.0)
Hemoglobin: 15.1 g/dL (ref 13.0–17.0)
Immature Granulocytes: 1 %
Lymphocytes Relative: 8 %
Lymphs Abs: 0.5 10*3/uL — ABNORMAL LOW (ref 0.7–4.0)
MCH: 29.4 pg (ref 26.0–34.0)
MCHC: 32 g/dL (ref 30.0–36.0)
MCV: 92 fL (ref 80.0–100.0)
Monocytes Absolute: 0.4 10*3/uL (ref 0.1–1.0)
Monocytes Relative: 6 %
Neutro Abs: 5 10*3/uL (ref 1.7–7.7)
Neutrophils Relative %: 85 %
Platelets: 147 10*3/uL — ABNORMAL LOW (ref 150–400)
RBC: 5.13 MIL/uL (ref 4.22–5.81)
RDW: 12.4 % (ref 11.5–15.5)
WBC: 5.8 10*3/uL (ref 4.0–10.5)
nRBC: 0 % (ref 0.0–0.2)

## 2020-01-05 LAB — GLUCOSE, CAPILLARY
Glucose-Capillary: 229 mg/dL — ABNORMAL HIGH (ref 70–99)
Glucose-Capillary: 190 mg/dL — ABNORMAL HIGH (ref 70–99)
Glucose-Capillary: 170 mg/dL — ABNORMAL HIGH (ref 70–99)

## 2020-01-05 LAB — FERRITIN: Ferritin: 767 ng/mL — ABNORMAL HIGH (ref 24–336)

## 2020-01-05 LAB — D-DIMER, QUANTITATIVE: D-Dimer, Quant: 0.52 ug/mL-FEU — ABNORMAL HIGH (ref 0.00–0.50)

## 2020-01-05 LAB — C-REACTIVE PROTEIN: CRP: 1.9 mg/dL — ABNORMAL HIGH (ref <1.0)

## 2020-01-05 MED ORDER — INSULIN ASPART 100 UNIT/ML ~~LOC~~ SOLN
0.0000 [IU] | Freq: Three times a day (TID) | SUBCUTANEOUS | Status: DC
  Administered 2020-01-05: 2 [IU] via SUBCUTANEOUS
  Administered 2020-01-05: 3 [IU] via SUBCUTANEOUS
  Administered 2020-01-06: 2 [IU] via SUBCUTANEOUS
  Administered 2020-01-06: 3 [IU] via SUBCUTANEOUS
  Administered 2020-01-06 – 2020-01-07 (×2): 2 [IU] via SUBCUTANEOUS
  Administered 2020-01-07: 1 [IU] via SUBCUTANEOUS
  Administered 2020-01-07 – 2020-01-09 (×5): 2 [IU] via SUBCUTANEOUS
  Administered 2020-01-09: 5 [IU] via SUBCUTANEOUS
  Administered 2020-01-09 – 2020-01-10 (×2): 3 [IU] via SUBCUTANEOUS
  Administered 2020-01-10 – 2020-01-11 (×3): 2 [IU] via SUBCUTANEOUS
  Administered 2020-01-11: 3 [IU] via SUBCUTANEOUS
  Administered 2020-01-11: 5 [IU] via SUBCUTANEOUS
  Administered 2020-01-12: 7 [IU] via SUBCUTANEOUS
  Administered 2020-01-12: 3 [IU] via SUBCUTANEOUS
  Administered 2020-01-12 – 2020-01-13 (×2): 2 [IU] via SUBCUTANEOUS
  Administered 2020-01-13: 3 [IU] via SUBCUTANEOUS
  Administered 2020-01-13: 7 [IU] via SUBCUTANEOUS
  Administered 2020-01-14: 2 [IU] via SUBCUTANEOUS
  Administered 2020-01-14 (×2): 5 [IU] via SUBCUTANEOUS
  Administered 2020-01-15 (×2): 2 [IU] via SUBCUTANEOUS
  Administered 2020-01-15 – 2020-01-16 (×2): 7 [IU] via SUBCUTANEOUS
  Administered 2020-01-16: 2 [IU] via SUBCUTANEOUS
  Administered 2020-01-16: 5 [IU] via SUBCUTANEOUS
  Administered 2020-01-17: 3 [IU] via SUBCUTANEOUS
  Administered 2020-01-17: 5 [IU] via SUBCUTANEOUS
  Administered 2020-01-17 – 2020-01-18 (×3): 2 [IU] via SUBCUTANEOUS
  Administered 2020-01-18: 5 [IU] via SUBCUTANEOUS
  Administered 2020-01-19: 3 [IU] via SUBCUTANEOUS

## 2020-01-05 MED ORDER — TRAZODONE HCL 50 MG PO TABS
50.0000 mg | ORAL_TABLET | Freq: Once | ORAL | Status: AC
  Administered 2020-01-05: 50 mg via ORAL
  Filled 2020-01-05: qty 1

## 2020-01-05 NOTE — Evaluation (Signed)
Occupational Therapy Evaluation Patient Details Name: Connor [name] MRN: [id number] DOB: 03/10/1946 Today's Date: 01/05/2020    History of Present Illness 74 y.o. male with medical history significant of Polycythemia Vera, BPH, mild COPD, carotid artery disease was diagnosed as COVID positive on 12/27/19. He was scheduled for and did receive monoclonal antibody infusion 01/03/2020. After treatment his O2 sat was 85%. He was referred to [facility] on 9/22 for evaluation and admission. He is unvaccinated. CXR with mild multifocal ground glass infiltrates.   Clinical Impression   [name] is a 74 year old man who is normally independent and active at baseline who is admitted to hospital with COVID pneumonia and presents with decreased strength, poor activity tolerance, decreased balance and cardiopulmonary endurance. Patient o2 sat 91% on 15 L salter set up while lying on his right side when therapist entered the room. Patient demonstrated ability to perform bed mobility and donn socks sitting at side of bed with increased time and difficulty. Patient able to ambulate to bathroom and stand at sink for grooming task then perform toileting in standing. Patient requiring set up and predominantly seated position for bathing and dressing.Patient reporting weak legs and mild dizziness after ADLs and returned to room to sit in recliner. Patient's o2 sat had maintained approx between 88-91% until ambulation back to recliner which then dropped down to 80% but recovered quickly in seated position to 91%. Patient reports compliance with proning, sidelying and use of breathing devices and therapist reiterated use. Patient also educated on activities to perform to maintain strength and ROM of extremities. Patient verbalized understanding of all education. Patient will benefit from skilled OT services to improve deficits, education and learn compensatory strategies for ADLs as needed ain order to return home at  discharge    Follow Up Recommendations  No OT follow up    Equipment Recommendations  None recommended by OT    Recommendations for Other Services       Precautions / Restrictions Precautions Precautions: Fall Restrictions Weight Bearing Restrictions: No      Mobility Bed Mobility Overal bed mobility: Needs Assistance Bed Mobility: Supine to Sit     Supine to sit: Min guard;HOB elevated     General bed mobility comments: for safety, increased time especially to scoot to EOB with use of bedrails to come to sitting.  Transfers Overall transfer level: Needs assistance   Transfers: Sit to/from Stand Sit to Stand: Min guard         General transfer comment: for safety, increased time to rise and steady. min assist for slow eccentric lower into recliner.    Balance Overall balance assessment: Mild deficits observed, not formally tested Sitting-balance support: No upper extremity supported;Feet supported Sitting balance-Leahy Scale: Good     Standing balance support: No upper extremity supported;During functional activity Standing balance-Leahy Scale: Fair Standing balance comment: x1 posterior LOB                           ADL either performed or assessed with clinical judgement   ADL Overall ADL's : Needs assistance/impaired Eating/Feeding: Independent   Grooming: Min guard;Standing;Wash/dry face;Oral care;Wash/dry hands Grooming Details (indicate cue type and reason): standing at sink with min guard Upper Body Bathing: Set up;Sitting   Lower Body Bathing: Set up;Sit to/from stand;Min guard   Upper Body Dressing : Set up;Sitting   Lower Body Dressing: Min guard;Sit to/from stand;Set up Lower Body Dressing Details (indicate cue type  and reason): able to donn socks with increased time and increased effort Toilet Transfer: Min guard;Ambulation   Toileting- Clothing Manipulation and Hygiene: Min guard       Functional mobility during ADLs: Min  guard       Vision Baseline Vision/History: Wears glasses Wears Glasses: Reading only       Perception     Praxis      Pertinent Vitals/Pain Pain Assessment: No/denies pain     Hand Dominance Right   Extremity/Trunk Assessment Upper Extremity Assessment Upper Extremity Assessment: Overall WFL for tasks assessed   Lower Extremity Assessment Lower Extremity Assessment: Defer to PT evaluation   Cervical / Trunk Assessment Cervical / Trunk Assessment: Normal   Communication Communication Communication: No difficulties   Cognition Arousal/Alertness: Awake/alert Behavior During Therapy: WFL for tasks assessed/performed Overall Cognitive Status: Within Functional Limits for tasks assessed                                     General Comments  15LO2 via HFNC, SpO2 80-90% during mobility    Exercises Other Exercises Other Exercises: Encouraged OOB with RN staff daily, continued proning at night as tolerated, LAQs and ankle pumps when up in chair   Shoulder Instructions      Home Living Family/patient expects to be discharged to:: Private residence Living Arrangements: Spouse/significant other Available Help at Discharge: Family Type of Home: House       Home Layout: Able to live on main level with bedroom/bathroom;Laundry or work area in basement     [employer] Shower/Tub: Occupational psychologist: Standard Bathroom Accessibility: Yes   Home Equipment: None;Shower seat - built in          Prior Functioning/Environment Level of Independence: Independent        Comments: pt enjoys golfing a couple of times a week, states he has a stretching and strengthening regimen he follows x4-5 times a week. Retired Development worker, community (worked in lab)        OT Problem List: Decreased activity tolerance;Impaired balance (sitting and/or standing);Decreased knowledge of use of DME or AE;Cardiopulmonary status limiting activity      OT  Treatment/Interventions: Self-care/ADL training;Therapeutic exercise;DME and/or AE instruction;Energy conservation;Therapeutic activities;Patient/family education;Balance training    OT Goals(Current goals can be found in the care plan section) Acute Rehab OT Goals Patient Stated Goal: breathe better, go home OT Goal Formulation: With patient Time For Goal Achievement: 01/19/20 Potential to Achieve Goals: Good  OT Frequency: Min 2X/week   Barriers to D/C:            Co-evaluation PT/OT/SLP Co-Evaluation/Treatment: Yes Reason for Co-Treatment: For patient/therapist safety;To address functional/ADL transfers PT goals addressed during session: Mobility/safety with mobility OT goals addressed during session: ADL's and self-care      AM-PAC OT "6 Clicks" Daily Activity     Outcome Measure Help from another person eating meals?: None Help from another person taking care of personal grooming?: A Little Help from another person toileting, which includes using toliet, bedpan, or urinal?: A Little Help from another person bathing (including washing, rinsing, drying)?: A Little Help from another person to put on and taking off regular upper body clothing?: A Little Help from another person to put on and taking off regular lower body clothing?: A Little 6 Click Score: 19   End of Session Equipment Utilized During Treatment: Oxygen Nurse Communication: Mobility status (  o2 sats)  Activity Tolerance: Patient limited by fatigue Patient left: in chair;with call bell/phone within reach  OT Visit Diagnosis: Unsteadiness on feet (R26.81);Muscle weakness (generalized) (M62.81)                Time: 1020-1045 OT Time Calculation (min): 25 min Charges:  OT General Charges $OT Visit: 1 Visit OT Evaluation $OT Eval Moderate Complexity: 1 Mod  Shalini Mair, OTR/L [place]  Office [phone] Pager: [place] 01/05/2020, 12:37 PM

## 2020-01-05 NOTE — Progress Notes (Addendum)
PROGRESS NOTE    Connor [name]  SWH:[id number] DOB: October 20, 1945 DOA: 01/05/2020 PCP: Dettinger, Fransisca Kaufmann, MD     Brief Narrative:  Connor [name] is a 74 y.o. male with medical history significant of Polycythemia Vera, BPH, mild COPD, carotid artery disease was diagnosed as COVID positive on 12/27/19. He had mild symptoms. He was scheduled for and did receive monoclonal antibody infusion 12/22/2019. After treatment his O2 sat was 85%. He was referred to [facility] for evaluation and admission. He is unvaccinated. CXR with mild multifocal ground glass infiltrates.   New events last 24 hours / Subjective: Had increasing work of breathing and desaturation yesterday, esp with meals. Currently doing well on 15L HFNC O2.   Assessment & Plan:   Principal Problem:   Pneumonia due to COVID-19 virus Active Problems:   Essential hypertension, benign   OSA (obstructive sleep apnea)   BPH (benign prostatic hyperplasia)   Hyperlipidemia LDL goal <100   Acute hypoxemic respiratory failure secondary to COVID-19 -Remains on 15 L high flow nasal cannula O2 currently, continue to wean as able  COVID-19 -Tested positive on 9/16.  Status post monoclonal antibody infusion 9/22 -Continue remdesivir, Solu-Medrol, baricitinib  -CTA chest negative for PE -Encourage prone positioning   COVID-19 Labs  Recent Labs    01/03/2020 1800 12/24/2019 1800 01/03/20 0500 01/03/20 0940 01/04/20 0423 01/05/20 0545  DDIMER 0.41   < >  --  0.69* 0.58* 0.52*  FERRITIN 321  --   --   --  926* 767*  LDH 239*  --   --   --   --   --   CRP 8.1*   < > 9.6*  --  6.9* 1.9*   < > = values in this interval not displayed.   Essential hypertension -Hold cozaar  Hyperlipidemia -Continue crestor   BPH -Continue alfuzosin   OSA -CPAP qhs    DVT prophylaxis:  enoxaparin (LOVENOX) injection 40 mg Start: 01/03/20 1000  Code Status: Full Family Communication: Updated wife over the phone 9/24, left voicemail  9/25 Disposition Plan:  Status is: Inpatient  Remains inpatient appropriate because:Inpatient level of care appropriate due to severity of illness   Dispo: The patient is from: Home              Anticipated d/c is to: Home              Anticipated d/c date is: 2 days              Patient currently is not medically stable to d/c.  Continue medical treatment for COVID-19.  Remains on 15 L high flow nasal cannula O2  Consultants:   None  Procedures:   None  Antimicrobials:  Anti-infectives (From admission, onward)   Start     Dose/Rate Route Frequency Ordered Stop   01/03/20 1600  remdesivir 100 mg in sodium chloride 0.9 % 100 mL IVPB       "Followed by" Linked Group Details   100 mg 200 mL/hr over 30 Minutes Intravenous Daily 01/03/2020 2111 01/07/20 0959   12/31/2019 2200  remdesivir 200 mg in sodium chloride 0.9% 250 mL IVPB       "Followed by" Linked Group Details   200 mg 580 mL/hr over 30 Minutes Intravenous Once 01/08/2020 2111 12/27/2019 2300       Objective: Vitals:   01/04/20 1030 01/04/20 1301 01/04/20 2001 01/05/20 0511  BP: (!) 143/72 (!) 141/61 126/62 (!) 146/59  Pulse: 97 94 87  79  Resp: 17 20 20 19   Temp: 98.5 F (36.9 C) 98 F (36.7 C) 98.4 F (36.9 C) 98.3 F (36.8 C)  TempSrc: Oral Oral Oral   SpO2: 93% 93% 93% 91%  Weight:      Height:        Intake/Output Summary (Last 24 hours) at 01/05/2020 1046 Last data filed at 01/05/2020 1045 Gross per 24 hour  Intake 600 ml  Output 2600 ml  Net -2000 ml   Filed Weights   12/13/2019 1626  Weight: 102.1 kg    Examination: General exam: Appears calm and comfortable  Respiratory system: Clear to auscultation. Respiratory effort normal. Without distress or conversational dyspnea  Cardiovascular system: S1 & S2 heard, RRR. No pedal edema. Gastrointestinal system: Abdomen is nondistended, soft and nontender. Normal bowel sounds heard. Central nervous system: Alert and oriented. Non focal exam. Speech clear   Extremities: Symmetric in appearance bilaterally  Skin: No rashes, lesions or ulcers on exposed skin  Psychiatry: Judgement and insight appear stable. Mood & affect appropriate.     Data Reviewed: I have personally reviewed following labs and imaging studies  CBC: Recent Labs  Lab 12/22/2019 1800 01/03/20 0500 01/04/20 0423 01/05/20 0545  WBC 2.4* 3.2* 5.0 5.8  NEUTROABS 1.9 2.8 4.4 5.0  HGB 14.8 14.4 15.0 15.1  HCT 44.7 43.1 45.7 47.2  MCV 90.7 90.7 90.7 92.0  PLT 84* 84* 112* 250*   Basic Metabolic Panel: Recent Labs  Lab 12/31/2019 1800 01/04/20 0423 01/05/20 0545  NA 135 141 143  K 3.9 4.4 4.3  CL 100 110 107  CO2 25 23 27   GLUCOSE 135* 221* 209*  BUN 29* 28* 33*  CREATININE 1.29* 0.93 0.91  CALCIUM 8.4* 8.3* 8.6*   GFR: Estimated Creatinine Clearance: 84 mL/min (by C-G formula based on SCr of 0.91 mg/dL). Liver Function Tests: Recent Labs  Lab 01/09/2020 1800 01/04/20 0423 01/05/20 0545  AST 53* 56* 56*  ALT 31 28 32  ALKPHOS 63 70 64  BILITOT 0.7 0.6 0.5  PROT 6.4* 6.0* 5.9*  ALBUMIN 3.7 3.0* 2.9*   No results for input(s): LIPASE, AMYLASE in the last 168 hours. No results for input(s): AMMONIA in the last 168 hours. Coagulation Profile: No results for input(s): INR, PROTIME in the last 168 hours. Cardiac Enzymes: No results for input(s): CKTOTAL, CKMB, CKMBINDEX, TROPONINI in the last 168 hours. BNP (last 3 results) No results for input(s): PROBNP in the last 8760 hours. HbA1C: No results for input(s): HGBA1C in the last 72 hours. CBG: No results for input(s): GLUCAP in the last 168 hours. Lipid Profile: Recent Labs    01/06/2020 1800  TRIG 75   Thyroid Function Tests: No results for input(s): TSH, T4TOTAL, FREET4, T3FREE, THYROIDAB in the last 72 hours. Anemia Panel: Recent Labs    01/04/20 0423 01/05/20 0545  FERRITIN 926* 767*   Sepsis Labs: Recent Labs  Lab 12/23/2019 1800  PROCALCITON <0.10  LATICACIDVEN 0.8    Recent Results  (from the past 240 hour(s))  Blood Culture (routine x 2)     Status: None (Preliminary result)   Collection Time: 12/17/2019  6:00 PM   Specimen: Site Not Specified; Blood  Result Value Ref Range Status   Specimen Description   Final    SITE NOT SPECIFIED Performed at [place] [facility] [address], [place], [place] [address]    Special Requests   Final    BOTTLES DRAWN AEROBIC AND ANAEROBIC Blood Culture results  may not be optimal due to an inadequate volume of blood received in culture bottles Performed at [facility] [address], [place], [place] [address]    Culture   Final    NO GROWTH < 24 HOURS Performed at [facility] [address] [place], [place] [address]    Report Status PENDING  Incomplete      Radiology Studies: CT ANGIO CHEST PE W OR WO CONTRAST  Result Date: 01/03/2020 CLINICAL DATA:  Polycythemia, recent COVID diagnosis, post monoclonal antibody infusion. Low O2 sats. EXAM: CT ANGIOGRAPHY CHEST WITH CONTRAST TECHNIQUE: Multidetector CT imaging of the chest was performed using the standard protocol during bolus administration of intravenous contrast. Multiplanar CT image reconstructions and MIPs were obtained to evaluate the vascular anatomy. Repeat injection was required because of poor enhancement on the first pass. CONTRAST:  135 mL OMNIPAQUE IOHEXOL 350 MG/ML SOLN COMPARISON:  06/06/2009 and previous FINDINGS: Cardiovascular: Heart size normal. No pericardial effusion. Satisfactory opacification of pulmonary arteries noted, and there is no evidence of pulmonary emboli. Adequate contrast opacification of the thoracic aorta with no evidence of dissection, aneurysm, or stenosis. There is classic 3-vessel brachiocephalic arch anatomy without proximal stenosis. Scattered calcified atheromatous plaque in the aortic isthmus. Mediastinum/Nodes: No mass or adenopathy. Lungs/Pleura: No pleural effusion. No pneumothorax. Extensive  ground-glass and scattered airspace opacities throughout both lungs, involving bases more than apices, with a largely peripheral distribution. Upper Abdomen: No acute findings. Stable small hepatic cysts since 07/17/2008. Musculoskeletal: Anterior vertebral endplate spurring at multiple levels in the mid and lower thoracic spine. Review of the MIP images confirms the above findings. IMPRESSION: 1. Negative for acute PE or thoracic aortic dissection. 2. Extensive ground-glass and scattered airspace opacities throughout both lungs, involving bases more than apices, with a largely peripheral distribution. Imaging features can be seen with (COVID-19 or viral) pneumonia, though are nonspecific and can occur with a variety of infectious and noninfectious processes. Aortic Atherosclerosis (ICD10-I70.0). Electronically Signed   By: Lucrezia Europe [name]   On: 01/03/2020 15:05      Scheduled Meds: . alfuzosin  10 mg Oral q morning - 10a  . baricitinib  4 mg Oral Daily  . enoxaparin (LOVENOX) injection  40 mg Subcutaneous Q24H  . ferrous sulfate  325 mg Oral Q breakfast  . insulin aspart  0-9 Units Subcutaneous TID WC  . loratadine  10 mg Oral Daily  . methylPREDNISolone (SOLU-MEDROL) injection  50 mg Intravenous Q12H   Followed by  . [START ON 01/06/2020] predniSONE  50 mg Oral Daily  . montelukast  10 mg Oral QHS  . rosuvastatin  10 mg Oral Daily  . senna  1 tablet Oral BID  . vitamin B-12  500 mcg Oral Daily   Continuous Infusions: . sodium chloride    . remdesivir 100 mg in NS 100 mL Stopped (01/04/20 0920)     LOS: 3 days      Time spent: 45 minutes   Dessa Phi, DO [facility] 01/05/2020, 10:46 AM   Available via Epic secure chat 7am-7pm After these hours, please refer to coverage provider listed on [email or web address]

## 2020-01-05 NOTE — Progress Notes (Signed)
Prone 4 times last night, for 30 minute duration, used the flutter valve on and off throughout the night was on 15 [place] has now been weaned to 12 liter [place] on 91 %. I will continue to monitor.

## 2020-01-05 NOTE — Evaluation (Signed)
Physical Therapy Evaluation Patient Details Name: Connor [name] MRN: [id number] DOB: 1946/01/15 Today's Date: 01/05/2020   History of Present Illness  74 y.o. male with medical history significant of Polycythemia Vera, BPH, mild COPD, carotid artery disease was diagnosed as COVID positive on 12/27/19. He was scheduled for and did receive monoclonal antibody infusion 12/13/2019. After treatment his O2 sat was 85%. He was referred to [facility] on [facility] for evaluation and admission. He is unvaccinated. CXR with mild multifocal ground glass infiltrates.  Clinical Impression   Pt presents with generalized weakness vs baseline, dyspnea on exertion with accompanying O2 desaturation, impaired dynamic standing balance, decreased knowledge of breathing technique, and decreased activity tolerance. Pt to benefit from acute PT to address deficits. Pt ambulated room distance x2 with standing rest at sink while performing ADLs, pt requiring 15LO2 via HFNC with SpO2 ranging from 80-90% during mobility. PT and OT encouraged proper breathing technique (in through nose, out through mouth, slowly) throughout session. PT anticipates no PT follow up needs. PT to progress mobility as tolerated, and will continue to follow acutely.      Follow Up Recommendations Supervision for mobility/OOB    Equipment Recommendations  None recommended by PT    Recommendations for Other Services       Precautions / Restrictions Precautions Precautions: Fall Restrictions Weight Bearing Restrictions: No      Mobility  Bed Mobility Overal bed mobility: Needs Assistance Bed Mobility: Supine to Sit     Supine to sit: Min guard;HOB elevated     General bed mobility comments: for safety, increased time especially to scoot to EOB with use of bedrails to come to sitting.  Transfers Overall transfer level: Needs assistance   Transfers: Sit to/from Stand Sit to Stand: Min guard         General transfer comment: for safety,  increased time to rise and steady. min assist for slow eccentric lower into recliner.  Ambulation/Gait Ambulation/Gait assistance: Min assist;Min guard Gait Distance (Feet): 15 Feet (2x15 - to and from bathroom) Assistive device: None Gait Pattern/deviations: Step-through pattern;Decreased stride length;Trunk flexed Gait velocity: decr   General Gait Details: min guard for safety, x1 period of min assist to steady when pt backing away from toilet. Pt endorsing dizziness at that time. SpO2 80-90% on 15LO2 via HFNC during mobility + DOE 2/4, reinforcing breathing technique and sitting/standing rest as needed.  Stairs            Wheelchair Mobility    Modified Rankin (Stroke Patients Only)       Balance Overall balance assessment: Needs assistance Sitting-balance support: No upper extremity supported;Feet supported Sitting balance-Leahy Scale: Good     Standing balance support: No upper extremity supported;During functional activity Standing balance-Leahy Scale: Fair Standing balance comment: x1 posterior LOB                             Pertinent Vitals/Pain Pain Assessment: No/denies pain    Home Living Family/patient expects to be discharged to:: Private residence Living Arrangements: Spouse/significant other Available Help at Discharge: Family Type of Home: House       Home Layout: Able to live on main level with bedroom/bathroom;Laundry or work area in [employer]: None      Prior Function Level of Independence: Independent         Comments: pt enjoys golfing a couple of times a week, states he has a stretching and strengthening regimen he  follows x4-5 times a week. Retired Development worker, community (worked in lab)     [place]: Right    Extremity/Trunk Assessment   Upper Extremity Assessment Upper Extremity Assessment: Defer to OT evaluation    Lower Extremity Assessment Lower Extremity  Assessment: Generalized weakness    Cervical / Trunk Assessment Cervical / Trunk Assessment: Normal  Communication   Communication: No difficulties  Cognition Arousal/Alertness: Awake/alert Behavior During Therapy: WFL for tasks assessed/performed Overall Cognitive Status: Within Functional Limits for tasks assessed                                        General Comments General comments (skin integrity, edema, etc.): 15LO2 via HFNC, SpO2 80-90% during mobility    Exercises Other Exercises Other Exercises: Encouraged OOB with RN staff daily, continued proning at night as tolerated, LAQs and ankle pumps when up in chair   Assessment/Plan    PT Assessment Patient needs continued PT services  PT Problem List Decreased strength;Decreased mobility;Decreased activity tolerance;Decreased balance;Cardiopulmonary status limiting activity;Decreased knowledge of precautions;Decreased safety awareness       PT Treatment Interventions DME instruction;Therapeutic activities;Gait training;Therapeutic exercise;Stair training;Balance training;Patient/family education;Functional mobility training;Neuromuscular re-education    PT Goals (Current goals can be found in the Care Plan section)  Acute Rehab PT Goals Patient Stated Goal: breathe better, go home PT Goal Formulation: With patient Time For Goal Achievement: 01/19/20 Potential to Achieve Goals: Good    Frequency Min 3X/week   Barriers to discharge        Co-evaluation PT/OT/SLP Co-Evaluation/Treatment: Yes Reason for Co-Treatment: For patient/therapist safety;To address functional/ADL transfers;Other (comment) (will be +1 moving forward)           AM-PAC PT "6 Clicks" Mobility  Outcome Measure Help needed turning from your back to your side while in a flat bed without using bedrails?: A Little Help needed moving from lying on your back to sitting on the side of a flat bed without using bedrails?: A Little Help  needed moving to and from a bed to a chair (including a wheelchair)?: A Little Help needed standing up from a chair using your arms (e.g., wheelchair or bedside chair)?: A Little Help needed to walk in hospital room?: A Little Help needed climbing 3-5 steps with a railing? : A Lot 6 Click Score: 17    End of Session Equipment Utilized During Treatment: Oxygen Activity Tolerance: Patient limited by fatigue;Treatment limited secondary to medical complications (Comment) (O2 desaturation) Patient left: in chair;with call bell/phone within reach (encouraged use of call bell and RN assist for back to bed) Nurse Communication: Mobility status PT Visit Diagnosis: Other abnormalities of gait and mobility (R26.89);Unsteadiness on feet (R26.81)    Time: 3151-7616 PT Time Calculation (min) (ACUTE ONLY): 23 min   Charges:   PT Evaluation $PT Eval Low Complexity: 1 Low          Chequita Mofield E, PT Acute Rehabilitation Services Pager [phone]  Office [phone]    Lynae Pederson D Elonda Husky 01/05/2020, 12:17 PM

## 2020-01-06 LAB — CBC WITH DIFFERENTIAL/PLATELET
Abs Immature Granulocytes: 0.04 10*3/uL (ref 0.00–0.07)
Basophils Absolute: 0 10*3/uL (ref 0.0–0.1)
Basophils Relative: 0 %
Eosinophils Absolute: 0 10*3/uL (ref 0.0–0.5)
Eosinophils Relative: 0 %
HCT: 46.8 % (ref 39.0–52.0)
Hemoglobin: 15.1 g/dL (ref 13.0–17.0)
Immature Granulocytes: 1 %
Lymphocytes Relative: 6 %
Lymphs Abs: 0.4 10*3/uL — ABNORMAL LOW (ref 0.7–4.0)
MCH: 29.9 pg (ref 26.0–34.0)
MCHC: 32.3 g/dL (ref 30.0–36.0)
MCV: 92.7 fL (ref 80.0–100.0)
Monocytes Absolute: 0.4 10*3/uL (ref 0.1–1.0)
Monocytes Relative: 6 %
Neutro Abs: 6.2 10*3/uL (ref 1.7–7.7)
Neutrophils Relative %: 87 %
Platelets: 143 10*3/uL — ABNORMAL LOW (ref 150–400)
RBC: 5.05 MIL/uL (ref 4.22–5.81)
RDW: 12.3 % (ref 11.5–15.5)
WBC: 7 10*3/uL (ref 4.0–10.5)
nRBC: 0 % (ref 0.0–0.2)

## 2020-01-06 LAB — GLUCOSE, CAPILLARY
Glucose-Capillary: 200 mg/dL — ABNORMAL HIGH (ref 70–99)
Glucose-Capillary: 212 mg/dL — ABNORMAL HIGH (ref 70–99)
Glucose-Capillary: 165 mg/dL — ABNORMAL HIGH (ref 70–99)
Glucose-Capillary: 157 mg/dL — ABNORMAL HIGH (ref 70–99)

## 2020-01-06 LAB — COMPREHENSIVE METABOLIC PANEL
ALT: 44 U/L (ref 0–44)
AST: 58 U/L — ABNORMAL HIGH (ref 15–41)
Albumin: 3 g/dL — ABNORMAL LOW (ref 3.5–5.0)
Alkaline Phosphatase: 63 U/L (ref 38–126)
Anion gap: 11 (ref 5–15)
BUN: 37 mg/dL — ABNORMAL HIGH (ref 8–23)
CO2: 26 mmol/L (ref 22–32)
Calcium: 8.7 mg/dL — ABNORMAL LOW (ref 8.9–10.3)
Chloride: 108 mmol/L (ref 98–111)
Creatinine, Ser: 0.95 mg/dL (ref 0.61–1.24)
GFR calc Af Amer: >60 mL/min (ref >60)
GFR calc non Af Amer: >60 mL/min (ref >60)
Glucose, Bld: 200 mg/dL — ABNORMAL HIGH (ref 70–99)
Potassium: 4.4 mmol/L (ref 3.5–5.1)
Sodium: 145 mmol/L (ref 135–145)
Total Bilirubin: 0.8 mg/dL (ref 0.3–1.2)
Total Protein: 5.7 g/dL — ABNORMAL LOW (ref 6.5–8.1)

## 2020-01-06 LAB — FERRITIN: Ferritin: 393 ng/mL — ABNORMAL HIGH (ref 24–336)

## 2020-01-06 LAB — C-REACTIVE PROTEIN: CRP: 0.8 mg/dL (ref <1.0)

## 2020-01-06 LAB — D-DIMER, QUANTITATIVE: D-Dimer, Quant: 0.77 ug/mL-FEU — ABNORMAL HIGH (ref 0.00–0.50)

## 2020-01-06 NOTE — Progress Notes (Signed)
PROGRESS NOTE    Connor [name]  DGU:[id number] DOB: April 18, 1945 DOA: 01/10/2020 PCP: Dettinger, Fransisca Kaufmann, MD     Brief Narrative:  Connor [name] is a 74 y.o. male with medical history significant of Polycythemia Vera, BPH, mild COPD, carotid artery disease was diagnosed as COVID positive on 12/27/19. He had mild symptoms. He was scheduled for and did receive monoclonal antibody infusion 12/26/2019. After treatment his O2 sat was 85%. He was referred [facility] for evaluation and admission. He is unvaccinated. CXR with mild multifocal ground glass infiltrates.   New events last 24 hours / Subjective: Doing great with prone positioning throughout the day. No new complaints or issues today. Now O2 weaned to [place]:   Principal Problem:   Pneumonia due to COVID-19 virus Active Problems:   Essential hypertension, benign   OSA (obstructive sleep apnea)   BPH (benign prostatic hyperplasia)   Hyperlipidemia LDL goal <100   Acute hypoxemic respiratory failure secondary to COVID-19 -Remains on 11 L high flow nasal cannula O2 currently, continue to wean as able  COVID-19 -Tested positive on 9/16.  Status post monoclonal antibody infusion 9/22 -Continue remdesivir, Solu-Medrol, baricitinib  -CTA chest negative for PE  -Encourage prone positioning   COVID-19 Labs  Recent Labs    01/04/20 0423 01/05/20 0545 01/06/20 0638  DDIMER 0.58* 0.52* 0.77*  FERRITIN 926* 767* 393*  CRP 6.9* 1.9* 0.8   Essential hypertension -Hold cozaar  Hyperlipidemia -Continue crestor   BPH -Continue alfuzosin   OSA -CPAP qhs    DVT prophylaxis:  enoxaparin (LOVENOX) injection 40 mg Start: 01/03/20 1000  Code Status: Full Family Communication: Updated wife over the phone 9/26 Disposition Plan:  Status is: Inpatient  Remains inpatient appropriate because:Inpatient level of care appropriate due to severity of illness   Dispo: The patient is from: Home               Anticipated d/c is to: Home              Anticipated d/c date is: 2 days              Patient currently is not medically stable to d/c.  Continue medical treatment for COVID-19.  Remains on 11 L high flow nasal cannula O2  Consultants:   None  Procedures:   None  Antimicrobials:  Anti-infectives (From admission, onward)   Start     Dose/Rate Route Frequency Ordered Stop   01/03/20 1600  remdesivir 100 mg in sodium chloride 0.9 % 100 mL IVPB       "Followed by" Linked Group Details   100 mg 200 mL/hr over 30 Minutes Intravenous Daily 01/07/2020 2111 01/07/20 0959   01/07/2020 2200  remdesivir 200 mg in sodium chloride 0.9% 250 mL IVPB       "Followed by" Linked Group Details   200 mg 580 mL/hr over 30 Minutes Intravenous Once 01/03/2020 2111 01/08/2020 2300       Objective: Vitals:   01/05/20 0511 01/05/20 1435 01/05/20 2120 01/06/20 0508  BP: (!) 146/59 (!) 144/72 (!) 142/66 (!) 144/79  Pulse: 79 91 87 98  Resp: 19 17 18 20   Temp: 98.3 F (36.8 C) 98 F (36.7 C) 98.2 F (36.8 C) 98.2 F (36.8 C)  TempSrc:  Oral Oral Oral  SpO2: 91% (!) 89% 90% 92%  Weight:      Height:        Intake/Output Summary (Last 24 hours) at 01/06/2020  [place] filed at 01/06/2020 0900 Gross per 24 hour  Intake 480 ml  Output 1100 ml  Net -620 ml   Filed Weights   12/19/2019 1626  Weight: 102.1 kg    Examination: General exam: Appears calm and comfortable  Respiratory system: Clear to auscultation. Respiratory effort normal. On 11L HFNC without distress or conversational dyspnea  Cardiovascular system: S1 & S2 heard, RRR. No pedal edema. Gastrointestinal system: Abdomen is nondistended, soft and nontender. Normal bowel sounds heard. Central nervous system: Alert and oriented. Non focal exam. Speech clear  Extremities: Symmetric in appearance bilaterally  Skin: No rashes, lesions or ulcers on exposed skin  Psychiatry: Judgement and insight appear stable. Mood & affect appropriate.     Data Reviewed: I have personally reviewed following labs and imaging studies  CBC: Recent Labs  Lab 12/21/2019 1800 01/03/20 0500 01/04/20 0423 01/05/20 0545 01/06/20 0638  WBC 2.4* 3.2* 5.0 5.8 7.0  NEUTROABS 1.9 2.8 4.4 5.0 6.2  HGB 14.8 14.4 15.0 15.1 15.1  HCT 44.7 43.1 45.7 47.2 46.8  MCV 90.7 90.7 90.7 92.0 92.7  PLT 84* 84* 112* 147* 782*   Basic Metabolic Panel: Recent Labs  Lab 12/28/2019 1800 01/04/20 0423 01/05/20 0545 01/06/20 0638  NA 135 141 143 145  K 3.9 4.4 4.3 4.4  CL 100 110 107 108  CO2 25 23 27 26   GLUCOSE 135* 221* 209* 200*  BUN 29* 28* 33* 37*  CREATININE 1.29* 0.93 0.91 0.95  CALCIUM 8.4* 8.3* 8.6* 8.7*   GFR: Estimated Creatinine Clearance: 80.5 mL/min (by C-G formula based on SCr of 0.95 mg/dL). Liver Function Tests: Recent Labs  Lab 12/25/2019 1800 01/04/20 0423 01/05/20 0545 01/06/20 0638  AST 53* 56* 56* 58*  ALT 31 28 32 44  ALKPHOS 63 70 64 63  BILITOT 0.7 0.6 0.5 0.8  PROT 6.4* 6.0* 5.9* 5.7*  ALBUMIN 3.7 3.0* 2.9* 3.0*   No results for input(s): LIPASE, AMYLASE in the last 168 hours. No results for input(s): AMMONIA in the last 168 hours. Coagulation Profile: No results for input(s): INR, PROTIME in the last 168 hours. Cardiac Enzymes: No results for input(s): CKTOTAL, CKMB, CKMBINDEX, TROPONINI in the last 168 hours. BNP (last 3 results) No results for input(s): PROBNP in the last 8760 hours. HbA1C: No results for input(s): HGBA1C in the last 72 hours. CBG: Recent Labs  Lab 01/05/20 1156 01/05/20 1742 01/05/20 2117 01/06/20 0754  GLUCAP 229* 190* 170* 200*   Lipid Profile: No results for input(s): CHOL, HDL, LDLCALC, TRIG, CHOLHDL, LDLDIRECT in the last 72 hours. Thyroid Function Tests: No results for input(s): TSH, T4TOTAL, FREET4, T3FREE, THYROIDAB in the last 72 hours. Anemia Panel: Recent Labs    01/05/20 0545 01/06/20 0638  FERRITIN 767* 393*   Sepsis Labs: Recent Labs  Lab 12/13/2019 1800   PROCALCITON <0.10  LATICACIDVEN 0.8    Recent Results (from the past 240 hour(s))  Blood Culture (routine x 2)     Status: None (Preliminary result)   Collection Time: 01/04/2020  6:00 PM   Specimen: Site Not Specified; Blood  Result Value Ref Range Status   Specimen Description   Final    SITE NOT SPECIFIED Performed at [facility] [address], [place], [place] [address]    Special Requests   Final    BOTTLES DRAWN AEROBIC AND ANAEROBIC Blood Culture results may not be optimal due to an inadequate volume of blood received in culture bottles Performed at [facility]  [place] [address] [place], [place] [address]    Culture   Final    NO GROWTH 4 DAYS Performed at [facility], [place] [address], [place], [place] [address]    Report Status PENDING  Incomplete      Radiology Studies: No results found.    Scheduled Meds: . alfuzosin  10 mg Oral q morning - 10a  . baricitinib  4 mg Oral Daily  . enoxaparin (LOVENOX) injection  40 mg Subcutaneous Q24H  . ferrous sulfate  325 mg Oral Q breakfast  . insulin aspart  0-9 Units Subcutaneous TID WC  . loratadine  10 mg Oral Daily  . montelukast  10 mg Oral QHS  . predniSONE  50 mg Oral Daily  . rosuvastatin  10 mg Oral Daily  . senna  1 tablet Oral BID  . vitamin B-12  500 mcg Oral Daily   Continuous Infusions: . sodium chloride    . remdesivir 100 mg in NS 100 mL 100 mg (01/06/20 1009)     LOS: 4 days      Time spent: 40 minutes   Dessa Phi, DO [facility] 01/06/2020, 10:33 AM   Available via Epic secure chat 7am-7pm After these hours, please refer to coverage provider listed on [email or web address]

## 2020-01-06 NOTE — Progress Notes (Addendum)
Prone for over 6 hours last night, continued to use the flutter valve, wean 02 from 15 high flow to 12 high flow to now 10 high flow sat  up in hair for over 5 hours during the day 01/05/20. I will continue to monitor.

## 2020-01-07 ENCOUNTER — Inpatient Hospital Stay (HOSPITAL_COMMUNITY): Payer: Medicare Other

## 2020-01-07 DIAGNOSIS — R7989 Other specified abnormal findings of blood chemistry: Secondary | ICD-10-CM

## 2020-01-07 LAB — COMPREHENSIVE METABOLIC PANEL
ALT: 41 U/L (ref 0–44)
AST: 41 U/L (ref 15–41)
Albumin: 2.7 g/dL — ABNORMAL LOW (ref 3.5–5.0)
Alkaline Phosphatase: 65 U/L (ref 38–126)
Anion gap: 10 (ref 5–15)
BUN: 30 mg/dL — ABNORMAL HIGH (ref 8–23)
CO2: 24 mmol/L (ref 22–32)
Calcium: 8.4 mg/dL — ABNORMAL LOW (ref 8.9–10.3)
Chloride: 105 mmol/L (ref 98–111)
Creatinine, Ser: 0.88 mg/dL (ref 0.61–1.24)
GFR calc Af Amer: >60 mL/min (ref >60)
GFR calc non Af Amer: >60 mL/min (ref >60)
Glucose, Bld: 128 mg/dL — ABNORMAL HIGH (ref 70–99)
Potassium: 3.7 mmol/L (ref 3.5–5.1)
Sodium: 139 mmol/L (ref 135–145)
Total Bilirubin: 1.1 mg/dL (ref 0.3–1.2)
Total Protein: 5.1 g/dL — ABNORMAL LOW (ref 6.5–8.1)

## 2020-01-07 LAB — CULTURE, BLOOD (ROUTINE X 2): Culture: NO GROWTH

## 2020-01-07 LAB — CBC WITH DIFFERENTIAL/PLATELET
Abs Immature Granulocytes: 0.09 10*3/uL — ABNORMAL HIGH (ref 0.00–0.07)
Basophils Absolute: 0 10*3/uL (ref 0.0–0.1)
Basophils Relative: 0 %
Eosinophils Absolute: 0 10*3/uL (ref 0.0–0.5)
Eosinophils Relative: 0 %
HCT: 48.6 % (ref 39.0–52.0)
Hemoglobin: 16.1 g/dL (ref 13.0–17.0)
Immature Granulocytes: 1 %
Lymphocytes Relative: 5 %
Lymphs Abs: 0.5 10*3/uL — ABNORMAL LOW (ref 0.7–4.0)
MCH: 30.2 pg (ref 26.0–34.0)
MCHC: 33.1 g/dL (ref 30.0–36.0)
MCV: 91.2 fL (ref 80.0–100.0)
Monocytes Absolute: 0.3 10*3/uL (ref 0.1–1.0)
Monocytes Relative: 3 %
Neutro Abs: 9 10*3/uL — ABNORMAL HIGH (ref 1.7–7.7)
Neutrophils Relative %: 91 %
Platelets: 124 10*3/uL — ABNORMAL LOW (ref 150–400)
RBC: 5.33 MIL/uL (ref 4.22–5.81)
RDW: 12.1 % (ref 11.5–15.5)
WBC: 10 10*3/uL (ref 4.0–10.5)
nRBC: 0 % (ref 0.0–0.2)

## 2020-01-07 LAB — GLUCOSE, CAPILLARY
Glucose-Capillary: 136 mg/dL — ABNORMAL HIGH (ref 70–99)
Glucose-Capillary: 190 mg/dL — ABNORMAL HIGH (ref 70–99)
Glucose-Capillary: 151 mg/dL — ABNORMAL HIGH (ref 70–99)
Glucose-Capillary: 135 mg/dL — ABNORMAL HIGH (ref 70–99)

## 2020-01-07 LAB — C-REACTIVE PROTEIN: CRP: 0.7 mg/dL (ref <1.0)

## 2020-01-07 LAB — FERRITIN: Ferritin: 326 ng/mL (ref 24–336)

## 2020-01-07 LAB — D-DIMER, QUANTITATIVE: D-Dimer, Quant: 3.18 ug/mL-FEU — ABNORMAL HIGH (ref 0.00–0.50)

## 2020-01-07 MED ORDER — RIVAROXABAN 20 MG PO TABS: 20.0000 mg | ORAL_TABLET | Freq: Every day | ORAL | Status: DC

## 2020-01-07 MED ORDER — RIVAROXABAN 15 MG PO TABS
15.0000 mg | ORAL_TABLET | Freq: Two times a day (BID) | ORAL | Status: DC
  Administered 2020-01-07 – 2020-01-21 (×28): 15 mg via ORAL
  Filled 2020-01-07 (×29): qty 1

## 2020-01-07 MED ORDER — RIVAROXABAN 15 MG PO TABS
15.0000 mg | ORAL_TABLET | ORAL | Status: AC
  Administered 2020-01-07: 15 mg via ORAL
  Filled 2020-01-07: qty 1

## 2020-01-07 NOTE — Progress Notes (Signed)
PROGRESS NOTE    Connor [name]  BHA:[id number] DOB: 1946/02/14 DOA: 12/24/2019 PCP: Dettinger, Fransisca Kaufmann, MD     Brief Narrative:  Connor [name] is a 74 y.o. male with medical history significant of Polycythemia Vera, BPH, mild COPD, carotid artery disease was diagnosed as COVID positive on 12/27/19. He had mild symptoms. He was scheduled for and did receive monoclonal antibody infusion 12/27/2019. After treatment his O2 sat was 85%. He was referred to [facility] for evaluation and admission. He is unvaccinated. CXR with mild multifocal ground glass infiltrates.   New events last 24 hours / Subjective: More tired and short of breath this morning.   Assessment & Plan:   Principal Problem:   Pneumonia due to COVID-19 virus Active Problems:   Essential hypertension, benign   OSA (obstructive sleep apnea)   BPH (benign prostatic hyperplasia)   Hyperlipidemia LDL goal <100   Acute hypoxemic respiratory failure secondary to COVID-19  -Continue 8 L O2, wean as able  COVID-19 Pneumonia -Tested positive on 9/16 -Not vaccinated -S/p monoclonal antibody 9/22  -Completed Remdesivir 9/22-9/26 -Continue steroids 9/23 >>  -Continue baricitinib 9/24 >> -Supportive treatments as ordered, encourage IS, encourage prone positioning, encourage mobilization  -CTA chest negative for PE -Due to continued rise in d-dimer, will obtain venous doppler  COVID-19 Labs  Recent Labs    01/05/20 0545 01/06/20 0638 01/07/20 0455 01/07/20 0523  DDIMER 0.52* 0.77* 3.18*  --   FERRITIN 767* 393*  --  326  CRP 1.9* 0.8  --  0.7    Essential hypertension -Hold cozaar  Hyperlipidemia -Continue crestor   BPH -Continue alfuzosin   OSA -CPAP qhs    DVT prophylaxis:  enoxaparin (LOVENOX) injection 40 mg Start: 01/03/20 1000  Code Status: Full Family Communication: Updated wife over the phone 9/27 Disposition Plan:  Status is: Inpatient  Remains inpatient appropriate because:Inpatient level of  care appropriate due to severity of illness   Dispo: The patient is from: Home              Anticipated d/c is to: Home              Anticipated d/c date is: 2 days              Patient currently is not medically stable to d/c.  Continue medical treatment for COVID-19.  Remains on 8 L high flow nasal cannula O2  Consultants:   None  Procedures:   None  Antimicrobials:  Anti-infectives (From admission, onward)   Start     Dose/Rate Route Frequency Ordered Stop   01/03/20 1600  remdesivir 100 mg in sodium chloride 0.9 % 100 mL IVPB       "Followed by" Linked Group Details   100 mg 200 mL/hr over 30 Minutes Intravenous Daily 01/05/2020 2111 01/06/20 1039   12/21/2019 2200  remdesivir 200 mg in sodium chloride 0.9% 250 mL IVPB       "Followed by" Linked Group Details   200 mg 580 mL/hr over 30 Minutes Intravenous Once 12/28/2019 2111 12/21/2019 2300       Objective: Vitals:   01/06/20 0508 01/06/20 1319 01/06/20 2048 01/07/20 0438  BP: (!) 144/79 (!) 142/87 136/74 140/60  Pulse: 98 94 86 92  Resp: 20 17 17 19   Temp: 98.2 F (36.8 C) 98.6 F (37 C) 98.7 F (37.1 C) 98 F (36.7 C)  TempSrc: Oral Oral Oral Oral  SpO2: 92% (!) 85% 90% 90%  Weight:  Height:        Intake/Output Summary (Last 24 hours) at 01/07/2020 1024 Last data filed at 01/07/2020 0700 Gross per 24 hour  Intake 1480 ml  Output 2350 ml  Net -870 ml   Filed Weights   12/25/2019 1626  Weight: 102.1 kg    Examination: General exam: Appears calm, fatigued appearing Respiratory system: Clear to auscultation. Respiratory effort normal.  Without conversational dyspnea.  On 8 L nasal cannula O2 Cardiovascular system: S1 & S2 heard, RRR. No pedal edema. Gastrointestinal system: Abdomen is nondistended, soft and nontender. Normal bowel sounds heard. Central nervous system: Alert and oriented. Non focal exam. Speech clear  Extremities: Symmetric in appearance bilaterally  Skin: No rashes, lesions or ulcers on  exposed skin  Psychiatry: Judgement and insight appear stable. Mood & affect appropriate.   Data Reviewed: I have personally reviewed following labs and imaging studies  CBC: Recent Labs  Lab 01/03/20 0500 01/04/20 0423 01/05/20 0545 01/06/20 0638 01/07/20 0455  WBC 3.2* 5.0 5.8 7.0 10.0  NEUTROABS 2.8 4.4 5.0 6.2 9.0*  HGB 14.4 15.0 15.1 15.1 16.1  HCT 43.1 45.7 47.2 46.8 48.6  MCV 90.7 90.7 92.0 92.7 91.2  PLT 84* 112* 147* 143* 010*   Basic Metabolic Panel: Recent Labs  Lab 01/05/2020 1800 01/04/20 0423 01/05/20 0545 01/06/20 0638 01/07/20 0523  NA 135 141 143 145 139  K 3.9 4.4 4.3 4.4 3.7  CL 100 110 107 108 105  CO2 25 23 27 26 24   GLUCOSE 135* 221* 209* 200* 128*  BUN 29* 28* 33* 37* 30*  CREATININE 1.29* 0.93 0.91 0.95 0.88  CALCIUM 8.4* 8.3* 8.6* 8.7* 8.4*   GFR: Estimated Creatinine Clearance: 86.9 mL/min (by C-G formula based on SCr of 0.88 mg/dL). Liver Function Tests: Recent Labs  Lab 12/18/2019 1800 01/04/20 0423 01/05/20 0545 01/06/20 0638 01/07/20 0523  AST 53* 56* 56* 58* 41  ALT 31 28 32 44 41  ALKPHOS 63 70 64 63 65  BILITOT 0.7 0.6 0.5 0.8 1.1  PROT 6.4* 6.0* 5.9* 5.7* 5.1*  ALBUMIN 3.7 3.0* 2.9* 3.0* 2.7*   No results for input(s): LIPASE, AMYLASE in the last 168 hours. No results for input(s): AMMONIA in the last 168 hours. Coagulation Profile: No results for input(s): INR, PROTIME in the last 168 hours. Cardiac Enzymes: No results for input(s): CKTOTAL, CKMB, CKMBINDEX, TROPONINI in the last 168 hours. BNP (last 3 results) No results for input(s): PROBNP in the last 8760 hours. HbA1C: No results for input(s): HGBA1C in the last 72 hours. CBG: Recent Labs  Lab 01/06/20 0754 01/06/20 1157 01/06/20 1640 01/06/20 2051 01/07/20 0746  GLUCAP 200* 212* 165* 157* 136*   Lipid Profile: No results for input(s): CHOL, HDL, LDLCALC, TRIG, CHOLHDL, LDLDIRECT in the last 72 hours. Thyroid Function Tests: No results for input(s): TSH,  T4TOTAL, FREET4, T3FREE, THYROIDAB in the last 72 hours. Anemia Panel: Recent Labs    01/06/20 0638 01/07/20 0523  FERRITIN 393* 326   Sepsis Labs: Recent Labs  Lab 12/15/2019 1800  PROCALCITON <0.10  LATICACIDVEN 0.8    Recent Results (from the past 240 hour(s))  Blood Culture (routine x 2)     Status: None   Collection Time: 12/24/2019  6:00 PM   Specimen: Site Not Specified; Blood  Result Value Ref Range Status   Specimen Description   Final    SITE NOT SPECIFIED Performed at [facility] [address] [place], [place] [address]  Special Requests   Final    BOTTLES DRAWN AEROBIC AND ANAEROBIC Blood Culture results may not be optimal due to an inadequate volume of blood received in culture bottles Performed at [facility] [address] [place], [place] [address]    Culture   Final    NO GROWTH 5 DAYS Performed at [facility], [place] [address], [place], [place] [address]    Report Status 01/07/2020 FINAL  Final      Radiology Studies: No results found.    Scheduled Meds:  alfuzosin  10 mg Oral q morning - 10a   baricitinib  4 mg Oral Daily   enoxaparin (LOVENOX) injection  40 mg Subcutaneous Q24H   ferrous sulfate  325 mg Oral Q breakfast   insulin aspart  0-9 Units Subcutaneous TID WC   loratadine  10 mg Oral Daily   montelukast  10 mg Oral QHS   predniSONE  50 mg Oral Daily   rosuvastatin  10 mg Oral Daily   senna  1 tablet Oral BID   vitamin B-12  500 mcg Oral Daily   Continuous Infusions:  sodium chloride       LOS: 5 days      Time spent: 40 minutes   Dessa Phi, DO [facility] 01/07/2020, 10:24 AM   Available via Epic secure chat 7am-7pm After these hours, please refer to coverage provider listed on [email or web address]

## 2020-01-07 NOTE — Progress Notes (Addendum)
Physical Therapy Treatment Patient Details Name: Connor [name] MRN: [id number] DOB: 1945-09-22 Today's Date: 01/07/2020    History of Present Illness 74 y.o. male with medical history significant of Polycythemia Vera, BPH, mild COPD, carotid artery disease was diagnosed as COVID positive on 12/27/19. He was scheduled for and did receive monoclonal antibody infusion 12/25/2019. After treatment his O2 sat was 85%. He was referred to [facility] on 9/22 for evaluation and admission. He is unvaccinated. CXR with mild multifocal ground glass infiltrates. (+) DVT.     PT Comments    Progressing slowly. Pt fatigues and desats easily with activity. O2 89% on 10 L at rest, 80% on 15L with ambulation. End of session: 89% 10L.    Follow Up Recommendations  Supervision for mobility/OOB     Equipment Recommendations  None recommended by PT    Recommendations for Other Services       Precautions / Restrictions Precautions Precautions: Fall Restrictions Weight Bearing Restrictions: No    Mobility  Bed Mobility Overal bed mobility: Modified Independent                Transfers Overall transfer level: Needs assistance Equipment used: None Transfers: Sit to/from Stand Sit to Stand: Min guard            Ambulation/Gait Ambulation/Gait assistance: Min assist;Min guard Gait Distance (Feet): 15 Feet (x2) Assistive device: None ("furniture walking") Gait Pattern/deviations: Step-through pattern;Decreased stride length     General Gait Details: Unsteady. O2 80% on 15L HFNC with ambulation. Dyspnea 3/4. Pt fatiues easily.   Stairs             Wheelchair Mobility    Modified Rankin (Stroke Patients Only)       Balance             Standing balance-Leahy Scale: Fair                              Cognition Arousal/Alertness: Awake/alert Behavior During Therapy: WFL for tasks assessed/performed Overall Cognitive Status: Within Functional Limits for tasks  assessed                                        Exercises      General Comments        Pertinent Vitals/Pain Pain Assessment: No/denies pain    Home Living                      Prior Function            PT Goals (current goals can now be found in the care plan section) Progress towards PT goals: Progressing toward goals    Frequency    Min 3X/week      PT Plan Current plan remains appropriate    Co-evaluation              AM-PAC PT "6 Clicks" Mobility   Outcome Measure  Help needed turning from your back to your side while in a flat bed without using bedrails?: None Help needed moving from lying on your back to sitting on the side of a flat bed without using bedrails?: None Help needed moving to and from a bed to a chair (including a wheelchair)?: A Little Help needed standing up from a chair using your arms (e.g., wheelchair or bedside chair)?: A  Little Help needed to walk in hospital room?: A Little Help needed climbing 3-5 steps with a railing? : A Little 6 Click Score: 20    End of Session Equipment Utilized During Treatment: Oxygen Activity Tolerance: Patient limited by fatigue (limited by O2 desaturation) Patient left: in bed;with call bell/phone within reach   PT Visit Diagnosis: Unsteadiness on feet (R26.81);Difficulty in walking, not elsewhere classified (R26.2)     Time: 0630-1601 PT Time Calculation (min) (ACUTE ONLY): 27 min  Charges:  $Gait Training: 8-22 mins                        Doreatha Massed, PT Acute Rehabilitation  Office: [phone] Pager: [phone]

## 2020-01-07 NOTE — Discharge Instructions (Signed)
Information on my medicine - XARELTO (rivaroxaban)  This medication education was reviewed with me or my healthcare representative as part of my discharge preparation.    WHY WAS XARELTO PRESCRIBED FOR YOU? Xarelto was prescribed to treat blood clots that may have been found in the veins of your legs (deep vein thrombosis) or in your lungs (pulmonary embolism) and to reduce the risk of them occurring again.  What do you need to know about Xarelto? The starting dose is one 15 mg tablet taken TWICE daily with food for the FIRST 21 DAYS then on 01/28/20  the dose is changed to one 20 mg tablet taken ONCE A DAY with your evening meal.  DO NOT stop taking Xarelto without talking to the health care provider who prescribed the medication.  Refill your prescription for 20 mg tablets before you run out.  After discharge, you should have regular check-up appointments with your healthcare provider that is prescribing your Xarelto.  In the future your dose may need to be changed if your kidney function changes by a significant amount.  What do you do if you miss a dose? If you are taking Xarelto TWICE DAILY and you miss a dose, take it as soon as you remember. You may take two 15 mg tablets (total 30 mg) at the same time then resume your regularly scheduled 15 mg twice daily the next day.  If you are taking Xarelto ONCE DAILY and you miss a dose, take it as soon as you remember on the same day then continue your regularly scheduled once daily regimen the next day. Do not take two doses of Xarelto at the same time.   Important Safety Information Xarelto is a blood thinner medicine that can cause bleeding. You should call your healthcare provider right away if you experience any of the following: ? Bleeding from an injury or your nose that does not stop. ? Unusual colored urine (red or dark brown) or unusual colored stools (red or black). ? Unusual bruising for unknown reasons. ? A serious fall  or if you hit your head (even if there is no bleeding).  Some medicines may interact with Xarelto and might increase your risk of bleeding while on Xarelto. To help avoid this, consult your healthcare provider or pharmacist prior to using any new prescription or non-prescription medications, including herbals, vitamins, non-steroidal anti-inflammatory drugs (NSAIDs) and supplements.  This website has more information on Xarelto: [email or web address]

## 2020-01-07 NOTE — Progress Notes (Signed)
Bilateral lower extremity venous duplex has been completed. Preliminary results can be found in CV Proc through chart review.  Results were given to the patient's nurse, Shanon Brow.  01/07/20 11:40 AM Carlos Levering RVT

## 2020-01-07 NOTE — Progress Notes (Signed)
[facility] for xarelto  Indication: acute DVT  Allergies  Allergen Reactions  . Dexlansoprazole Nausea And Vomiting  . Celecoxib Other (See Comments)    Solid white - generic pill only = caused diarrhea (other generics ok)  Solid white - generic pill only = caused diarrhea (other generics ok)     Patient Measurements: Height: 5\' 11"  (180.3 cm) Weight: 102.1 kg (225 lb) IBW/kg (Calculated) : 75.3 Heparin Dosing Weight:   Vital Signs: Temp: 98 F (36.7 C) (09/27 0438) Temp Source: Oral (09/27 0438) BP: 140/60 (09/27 0438) Pulse Rate: 92 (09/27 0438)  Labs: Recent Labs    01/05/20 0545 01/05/20 0545 01/06/20 0638 01/07/20 0455 01/07/20 0523  HGB 15.1   < > 15.1 16.1  --   HCT 47.2  --  46.8 48.6  --   PLT 147*  --  143* 124*  --   CREATININE 0.91  --  0.95  --  0.88   < > = values in this interval not displayed.    Estimated Creatinine Clearance: 86.9 mL/min (by C-G formula based on SCr of 0.88 mg/dL).   Assessment: Patient's a 74 y.o M currently hospitalized for COVID-19.  LE doppler on 9/27 showed acute deep vein thrombosis involving the right peroneal veins.  Pharmacy is consulted to start xarelto for acute VTE.  Patient received lovenox 40 mg dose on 9/27 at 0900   Plan:  - xarelto 15mg  bid x 21 days, then 20mg  daily  - monitor for s/sx bleeding - with stable renal function, pharmacy will sign off but will follow patient peripherally along with you  Jawan Chavarria P 01/07/2020,1:10 PM

## 2020-01-08 ENCOUNTER — Inpatient Hospital Stay (HOSPITAL_COMMUNITY): Payer: Medicare Other

## 2020-01-08 LAB — GLUCOSE, CAPILLARY
Glucose-Capillary: 170 mg/dL — ABNORMAL HIGH (ref 70–99)
Glucose-Capillary: 180 mg/dL — ABNORMAL HIGH (ref 70–99)
Glucose-Capillary: 189 mg/dL — ABNORMAL HIGH (ref 70–99)
Glucose-Capillary: 174 mg/dL — ABNORMAL HIGH (ref 70–99)

## 2020-01-08 LAB — COMPREHENSIVE METABOLIC PANEL
ALT: 34 U/L (ref 0–44)
AST: 32 U/L (ref 15–41)
Albumin: 3 g/dL — ABNORMAL LOW (ref 3.5–5.0)
Alkaline Phosphatase: 61 U/L (ref 38–126)
Anion gap: 10 (ref 5–15)
BUN: 25 mg/dL — ABNORMAL HIGH (ref 8–23)
CO2: 24 mmol/L (ref 22–32)
Calcium: 8.2 mg/dL — ABNORMAL LOW (ref 8.9–10.3)
Chloride: 104 mmol/L (ref 98–111)
Creatinine, Ser: 0.86 mg/dL (ref 0.61–1.24)
GFR calc Af Amer: >60 mL/min (ref >60)
GFR calc non Af Amer: >60 mL/min (ref >60)
Glucose, Bld: 145 mg/dL — ABNORMAL HIGH (ref 70–99)
Potassium: 3.5 mmol/L (ref 3.5–5.1)
Sodium: 138 mmol/L (ref 135–145)
Total Bilirubin: 1.5 mg/dL — ABNORMAL HIGH (ref 0.3–1.2)
Total Protein: 5.6 g/dL — ABNORMAL LOW (ref 6.5–8.1)

## 2020-01-08 LAB — CBC WITH DIFFERENTIAL/PLATELET
Abs Immature Granulocytes: 0.11 10*3/uL — ABNORMAL HIGH (ref 0.00–0.07)
Basophils Absolute: 0 10*3/uL (ref 0.0–0.1)
Basophils Relative: 0 %
Eosinophils Absolute: 0 10*3/uL (ref 0.0–0.5)
Eosinophils Relative: 0 %
HCT: 46.8 % (ref 39.0–52.0)
Hemoglobin: 15.5 g/dL (ref 13.0–17.0)
Immature Granulocytes: 1 %
Lymphocytes Relative: 3 %
Lymphs Abs: 0.3 10*3/uL — ABNORMAL LOW (ref 0.7–4.0)
MCH: 30.1 pg (ref 26.0–34.0)
MCHC: 33.1 g/dL (ref 30.0–36.0)
MCV: 90.9 fL (ref 80.0–100.0)
Monocytes Absolute: 0.2 10*3/uL (ref 0.1–1.0)
Monocytes Relative: 2 %
Neutro Abs: 8.4 10*3/uL — ABNORMAL HIGH (ref 1.7–7.7)
Neutrophils Relative %: 94 %
Platelets: 102 10*3/uL — ABNORMAL LOW (ref 150–400)
RBC: 5.15 MIL/uL (ref 4.22–5.81)
RDW: 11.9 % (ref 11.5–15.5)
WBC: 9 10*3/uL (ref 4.0–10.5)
nRBC: 0 % (ref 0.0–0.2)

## 2020-01-08 LAB — D-DIMER, QUANTITATIVE: D-Dimer, Quant: 1.72 ug/mL-FEU — ABNORMAL HIGH (ref 0.00–0.50)

## 2020-01-08 LAB — FERRITIN: Ferritin: 452 ng/mL — ABNORMAL HIGH (ref 24–336)

## 2020-01-08 LAB — C-REACTIVE PROTEIN: CRP: 13 mg/dL — ABNORMAL HIGH (ref <1.0)

## 2020-01-08 MED ORDER — METHYLPREDNISOLONE SODIUM SUCC 125 MG IJ SOLR
50.0000 mg | Freq: Two times a day (BID) | INTRAMUSCULAR | Status: DC
  Administered 2020-01-08 – 2020-01-17 (×20): 50 mg via INTRAVENOUS
  Filled 2020-01-08 (×20): qty 2

## 2020-01-08 MED ORDER — ASCORBIC ACID 500 MG PO TABS
500.0000 mg | ORAL_TABLET | Freq: Every day | ORAL | Status: DC
  Administered 2020-01-08 – 2020-01-19 (×12): 500 mg via ORAL
  Filled 2020-01-08 (×14): qty 1

## 2020-01-08 MED ORDER — SALINE SPRAY 0.65 % NA SOLN
1.0000 | NASAL | Status: DC | PRN
  Administered 2020-01-08 – 2020-01-09 (×2): 1 via NASAL
  Filled 2020-01-08: qty 44

## 2020-01-08 MED ORDER — ZINC SULFATE 220 (50 ZN) MG PO CAPS
220.0000 mg | ORAL_CAPSULE | Freq: Every day | ORAL | Status: DC
  Administered 2020-01-08 – 2020-01-19 (×12): 220 mg via ORAL
  Filled 2020-01-08 (×14): qty 1

## 2020-01-08 NOTE — Progress Notes (Addendum)
PROGRESS NOTE    RONDEL EPISCOPO  YQM:[id number] DOB: [birth year] DOA: 12/22/2019 PCP: Dettinger, Fransisca Kaufmann, MD     Brief Narrative:  Connor [name] is a 74 y.o. male with medical history significant of Polycythemia Vera, BPH, mild COPD, carotid artery disease was diagnosed as COVID positive on 12/27/19. He had mild symptoms. He was scheduled for and did receive monoclonal antibody infusion 01/05/2020. After treatment his O2 sat was 85%. He was referred to [facility] for evaluation and admission. He is unvaccinated. CXR with mild multifocal ground glass infiltrates.   New events last 24 hours / Subjective: Just got up to use the bedside commode. Having shortness of breath with minimal activity. No new complaints.   Assessment & Plan:   Principal Problem:   Pneumonia due to COVID-19 virus Active Problems:   Essential hypertension, benign   OSA (obstructive sleep apnea)   BPH (benign prostatic hyperplasia)   Hyperlipidemia LDL goal <100   Acute hypoxemic respiratory failure secondary to COVID-19  -Continue 8 L O2, wean as able  COVID-19 Pneumonia -Tested positive on 9/16 -Not vaccinated -S/p monoclonal antibody 9/22  -Completed Remdesivir 9/22-9/26 -Continue steroids 9/23 >>  -Continue baricitinib 9/24 >> -Supportive treatments as ordered, encourage IS, encourage prone positioning, encourage mobilization  -CTA chest negative for PE -Venous doppler positive for right LE DVT. Started Xarelto 9/27  -CRP elevated overnight. Repeat CXR reviewed independently 9/28 which reveals diffuse bilateral infiltrates, without improvement or progression from previous. Will increase steroid dose today due to elevated CRP and difficulty weaning down O2    COVID-19 Labs  Recent Labs    01/06/20 0638 01/07/20 0455 01/07/20 0523 01/08/20 0402  DDIMER 0.77* 3.18*  --  1.72*  FERRITIN 393*  --  326 452*  CRP 0.8  --  0.7 13.0*    Essential hypertension -Hold cozaar  Hyperlipidemia -Continue  crestor   BPH -Continue alfuzosin   OSA -CPAP qhs    DVT prophylaxis:  Place and maintain sequential compression device Start: 01/07/20 1315  Code Status: Full Family Communication: Updated wife over phone 9/28  Disposition Plan:  Status is: Inpatient  Remains inpatient appropriate because:Inpatient level of care appropriate due to severity of illness   Dispo: The patient is from: Home              Anticipated d/c is to: Home              Anticipated d/c date is: 3 days              Patient currently is not medically stable to d/c.  Continue medical treatment for COVID-19.  Remains on 8 L high flow nasal cannula O2  Consultants:   None  Procedures:   None  Antimicrobials:  Anti-infectives (From admission, onward)   Start     Dose/Rate Route Frequency Ordered Stop   01/03/20 1600  remdesivir 100 mg in sodium chloride 0.9 % 100 mL IVPB       "Followed by" Linked Group Details   100 mg 200 mL/hr over 30 Minutes Intravenous Daily 12/26/2019 2111 01/06/20 1039   12/14/2019 2200  remdesivir 200 mg in sodium chloride 0.9% 250 mL IVPB       "Followed by" Linked Group Details   200 mg 580 mL/hr over 30 Minutes Intravenous Once 12/12/2019 2111 12/23/2019 2300       Objective: Vitals:   01/07/20 0438 01/07/20 1630 01/07/20 1935 01/08/20 0421  BP: 140/60 (!) 144/78 (!) 144/73 (!) 150/68  Pulse: 92 100 96 (!) 101  Resp: 19 19 19 20   Temp: 98 F (36.7 C) 98.3 F (36.8 C) 99 F (37.2 C) 99 F (37.2 C)  TempSrc: Oral Oral Oral   SpO2: 90% (!) 88% (!) 84% (!) 83%  Weight:      Height:        Intake/Output Summary (Last 24 hours) at 01/08/2020 1317 Last data filed at 01/08/2020 0846 Gross per 24 hour  Intake 615 ml  Output 1450 ml  Net -835 ml   Filed Weights   12/23/2019 1626  Weight: 102.1 kg    Examination: General exam: Appears calm although having increased work of breathing due to having just used a bedside commode Respiratory system: Tachypnea, on 8 L  O2 Cardiovascular system: S1 & S2 heard, RRR. No pedal edema. Gastrointestinal system: Abdomen is nondistended, soft and nontender. Normal bowel sounds heard. Central nervous system: Alert and oriented. Non focal exam. Speech clear  Extremities: Symmetric in appearance bilaterally  Skin: No rashes, lesions or ulcers on exposed skin  Psychiatry: Judgement and insight appear stable. Mood & affect appropriate.   Data Reviewed: I have personally reviewed following labs and imaging studies  CBC: Recent Labs  Lab 01/04/20 0423 01/05/20 0545 01/06/20 0638 01/07/20 0455 01/08/20 0402  WBC 5.0 5.8 7.0 10.0 9.0  NEUTROABS 4.4 5.0 6.2 9.0* 8.4*  HGB 15.0 15.1 15.1 16.1 15.5  HCT 45.7 47.2 46.8 48.6 46.8  MCV 90.7 92.0 92.7 91.2 90.9  PLT 112* 147* 143* 124* 532*   Basic Metabolic Panel: Recent Labs  Lab 01/04/20 0423 01/05/20 0545 01/06/20 0638 01/07/20 0523 01/08/20 0402  NA 141 143 145 139 138  K 4.4 4.3 4.4 3.7 3.5  CL 110 107 108 105 104  CO2 23 27 26 24 24   GLUCOSE 221* 209* 200* 128* 145*  BUN 28* 33* 37* 30* 25*  CREATININE 0.93 0.91 0.95 0.88 0.86  CALCIUM 8.3* 8.6* 8.7* 8.4* 8.2*   GFR: Estimated Creatinine Clearance: 88.9 mL/min (by C-G formula based on SCr of 0.86 mg/dL). Liver Function Tests: Recent Labs  Lab 01/04/20 0423 01/05/20 0545 01/06/20 9924 01/07/20 0523 01/08/20 0402  AST 56* 56* 58* 41 32  ALT 28 32 44 41 34  ALKPHOS 70 64 63 65 61  BILITOT 0.6 0.5 0.8 1.1 1.5*  PROT 6.0* 5.9* 5.7* 5.1* 5.6*  ALBUMIN 3.0* 2.9* 3.0* 2.7* 3.0*   No results for input(s): LIPASE, AMYLASE in the last 168 hours. No results for input(s): AMMONIA in the last 168 hours. Coagulation Profile: No results for input(s): INR, PROTIME in the last 168 hours. Cardiac Enzymes: No results for input(s): CKTOTAL, CKMB, CKMBINDEX, TROPONINI in the last 168 hours. BNP (last 3 results) No results for input(s): PROBNP in the last 8760 hours. HbA1C: No results for input(s):  HGBA1C in the last 72 hours. CBG: Recent Labs  Lab 01/07/20 1127 01/07/20 1632 01/07/20 1936 01/08/20 0735 01/08/20 1111  GLUCAP 190* 151* 135* 170* 180*   Lipid Profile: No results for input(s): CHOL, HDL, LDLCALC, TRIG, CHOLHDL, LDLDIRECT in the last 72 hours. Thyroid Function Tests: No results for input(s): TSH, T4TOTAL, FREET4, T3FREE, THYROIDAB in the last 72 hours. Anemia Panel: Recent Labs    01/07/20 0523 01/08/20 0402  FERRITIN 326 452*   Sepsis Labs: Recent Labs  Lab 01/03/2020 1800  PROCALCITON <0.10  LATICACIDVEN 0.8    Recent Results (from the past 240 hour(s))  Blood Culture (routine x 2)  Status: None   Collection Time: 12/31/2019  6:00 PM   Specimen: Site Not Specified; Blood  Result Value Ref Range Status   Specimen Description   Final    SITE NOT SPECIFIED Performed at [facility] [address], [place], [place] [address]    Special Requests   Final    BOTTLES DRAWN AEROBIC AND ANAEROBIC Blood Culture results may not be optimal due to an inadequate volume of blood received in culture bottles Performed at [facility] [address], [place], [place] [address]    Culture   Final    NO GROWTH 5 DAYS Performed at [facility], [place] [address] [place], [place] [address]    Report Status 01/07/2020 FINAL  Final      Radiology Studies: DG CHEST PORT 1 VIEW  Result Date: 01/08/2020 CLINICAL DATA:  Pneumonia.  COVID positive. EXAM: PORTABLE CHEST 1 VIEW COMPARISON:  CT 01/03/2020.  12/24/2019. FINDINGS: Cardiomegaly. Diffuse bilateral pulmonary infiltrates/edema again noted. No interim change from prior exam. No pleural effusion or pneumothorax. IMPRESSION: 1. Cardiomegaly. 2. Diffuse bilateral pulmonary infiltrates/edema again noted. No interim change from prior exam. Electronically Signed   By: Marcello Moores  Register   On: 01/08/2020 09:15   VAS [place] LOWER EXTREMITY VENOUS (DVT)  Result Date: 01/07/2020   Lower Venous DVT Study Indications: Elevated Ddimer.  Risk Factors: COVID 19 positive. Comparison Study: No prior studies. Performing Technologist: Oliver Hum RVT  Examination Guidelines: A complete evaluation includes B-mode imaging, spectral Doppler, color Doppler, and power Doppler as needed of all accessible portions of each vessel. Bilateral testing is considered an integral part of a complete examination. Limited examinations for reoccurring indications may be performed as noted. The reflux portion of the exam is performed with the patient in reverse Trendelenburg.  +---------+---------------+---------+-----------+----------+--------------+ RIGHT    CompressibilityPhasicitySpontaneityPropertiesThrombus Aging +---------+---------------+---------+-----------+----------+--------------+ CFV      Full           Yes      Yes                                 +---------+---------------+---------+-----------+----------+--------------+ SFJ      Full                                                        +---------+---------------+---------+-----------+----------+--------------+ FV Prox  Full                                                        +---------+---------------+---------+-----------+----------+--------------+ FV Mid   Full                                                        +---------+---------------+---------+-----------+----------+--------------+ FV DistalFull                                                        +---------+---------------+---------+-----------+----------+--------------+  PFV      Full                                                        +---------+---------------+---------+-----------+----------+--------------+ POP      Full           Yes      Yes                                 +---------+---------------+---------+-----------+----------+--------------+ PTV      Full                                                         +---------+---------------+---------+-----------+----------+--------------+ PERO     Partial                                      Acute          +---------+---------------+---------+-----------+----------+--------------+   +---------+---------------+---------+-----------+----------+--------------+ LEFT     CompressibilityPhasicitySpontaneityPropertiesThrombus Aging +---------+---------------+---------+-----------+----------+--------------+ CFV      Full           Yes      Yes                                 +---------+---------------+---------+-----------+----------+--------------+ SFJ      Full                                                        +---------+---------------+---------+-----------+----------+--------------+ FV Prox  Full                                                        +---------+---------------+---------+-----------+----------+--------------+ FV Mid   Full                                                        +---------+---------------+---------+-----------+----------+--------------+ FV DistalFull                                                        +---------+---------------+---------+-----------+----------+--------------+ PFV      Full                                                        +---------+---------------+---------+-----------+----------+--------------+  POP      Full           Yes      Yes                                 +---------+---------------+---------+-----------+----------+--------------+ PTV      Full                                                        +---------+---------------+---------+-----------+----------+--------------+ PERO     Full                                                        +---------+---------------+---------+-----------+----------+--------------+     Summary: RIGHT: - Findings consistent with acute deep vein thrombosis involving the right peroneal veins. - No cystic  structure found in the popliteal fossa.  LEFT: - There is no evidence of deep vein thrombosis in the lower extremity.  - No cystic structure found in the popliteal fossa.  *See table(s) above for measurements and observations. Electronically signed by Ruta Hinds MD on 01/07/2020 at 2:54:27 PM.    Final       Scheduled Meds: . alfuzosin  10 mg Oral q morning - 10a  . baricitinib  4 mg Oral Daily  . ferrous sulfate  325 mg Oral Q breakfast  . insulin aspart  0-9 Units Subcutaneous TID WC  . loratadine  10 mg Oral Daily  . methylPREDNISolone (SOLU-MEDROL) injection  50 mg Intravenous Q12H  . montelukast  10 mg Oral QHS  . Rivaroxaban  15 mg Oral BID WC   Followed by  . [START ON 01/28/2020] rivaroxaban  20 mg Oral Q supper  . rosuvastatin  10 mg Oral Daily  . senna  1 tablet Oral BID  . vitamin B-12  500 mcg Oral Daily   Continuous Infusions: . sodium chloride       LOS: 6 days      Time spent: 40 minutes   Dessa Phi, DO [facility] 01/08/2020, 1:17 PM   Available via Epic secure chat 7am-7pm After these hours, please refer to coverage provider listed on [email or web address]

## 2020-01-08 NOTE — Care Management Important Message (Signed)
Important Message  Patient Details IM Letter given to the Patient Name: Connor [name] MRN: [id number] Date of Birth: 12/13/1945   Medicare Important Message Given:  Yes     Kerin Salen 01/08/2020, 10:40 AM

## 2020-01-09 LAB — COMPREHENSIVE METABOLIC PANEL
ALT: 36 U/L (ref 0–44)
AST: 28 U/L (ref 15–41)
Albumin: 3 g/dL — ABNORMAL LOW (ref 3.5–5.0)
Alkaline Phosphatase: 69 U/L (ref 38–126)
Anion gap: 11 (ref 5–15)
BUN: 26 mg/dL — ABNORMAL HIGH (ref 8–23)
CO2: 25 mmol/L (ref 22–32)
Calcium: 8.6 mg/dL — ABNORMAL LOW (ref 8.9–10.3)
Chloride: 103 mmol/L (ref 98–111)
Creatinine, Ser: 0.82 mg/dL (ref 0.61–1.24)
GFR calc Af Amer: >60 mL/min (ref >60)
GFR calc non Af Amer: >60 mL/min (ref >60)
Glucose, Bld: 198 mg/dL — ABNORMAL HIGH (ref 70–99)
Potassium: 4.3 mmol/L (ref 3.5–5.1)
Sodium: 139 mmol/L (ref 135–145)
Total Bilirubin: 1.2 mg/dL (ref 0.3–1.2)
Total Protein: 6.1 g/dL — ABNORMAL LOW (ref 6.5–8.1)

## 2020-01-09 LAB — CBC WITH DIFFERENTIAL/PLATELET
Abs Immature Granulocytes: 0.13 10*3/uL — ABNORMAL HIGH (ref 0.00–0.07)
Basophils Absolute: 0 10*3/uL (ref 0.0–0.1)
Basophils Relative: 0 %
Eosinophils Absolute: 0 10*3/uL (ref 0.0–0.5)
Eosinophils Relative: 0 %
HCT: 49.6 % (ref 39.0–52.0)
Hemoglobin: 16.3 g/dL (ref 13.0–17.0)
Immature Granulocytes: 2 %
Lymphocytes Relative: 3 %
Lymphs Abs: 0.3 10*3/uL — ABNORMAL LOW (ref 0.7–4.0)
MCH: 29.7 pg (ref 26.0–34.0)
MCHC: 32.9 g/dL (ref 30.0–36.0)
MCV: 90.3 fL (ref 80.0–100.0)
Monocytes Absolute: 0.1 10*3/uL (ref 0.1–1.0)
Monocytes Relative: 1 %
Neutro Abs: 8.4 10*3/uL — ABNORMAL HIGH (ref 1.7–7.7)
Neutrophils Relative %: 94 %
Platelets: 138 10*3/uL — ABNORMAL LOW (ref 150–400)
RBC: 5.49 MIL/uL (ref 4.22–5.81)
RDW: 11.9 % (ref 11.5–15.5)
WBC: 8.9 10*3/uL (ref 4.0–10.5)
nRBC: 0 % (ref 0.0–0.2)

## 2020-01-09 LAB — FERRITIN: Ferritin: 518 ng/mL — ABNORMAL HIGH (ref 24–336)

## 2020-01-09 LAB — D-DIMER, QUANTITATIVE: D-Dimer, Quant: 1.79 ug/mL-FEU — ABNORMAL HIGH (ref 0.00–0.50)

## 2020-01-09 LAB — GLUCOSE, CAPILLARY
Glucose-Capillary: 186 mg/dL — ABNORMAL HIGH (ref 70–99)
Glucose-Capillary: 280 mg/dL — ABNORMAL HIGH (ref 70–99)
Glucose-Capillary: 205 mg/dL — ABNORMAL HIGH (ref 70–99)
Glucose-Capillary: 190 mg/dL — ABNORMAL HIGH (ref 70–99)

## 2020-01-09 LAB — C-REACTIVE PROTEIN: CRP: 15.8 mg/dL — ABNORMAL HIGH (ref <1.0)

## 2020-01-09 NOTE — Progress Notes (Signed)
PROGRESS NOTE    Connor [name]  MHD:[id number] DOB: 08/22/1945 DOA: 12/26/2019 PCP: Dettinger, Fransisca Kaufmann, MD    Brief Narrative:  74 year old male with polycythemia vera, BPH, mild COPD, coronary artery disease diagnosed with Covid on 9/16. Initially mild symptoms. Went to receive monoclonal antibody infusion on 9/22. After treatment oxygen was 85% so referred to ER. Unvaccinated against COVID-19. Chest x-ray with bilateral multifocal groundglass infiltrates.   Assessment & Plan:   Principal Problem:   Pneumonia due to COVID-19 virus Active Problems:   Essential hypertension, benign   OSA (obstructive sleep apnea)   BPH (benign prostatic hyperplasia)   Hyperlipidemia LDL goal <100  Acute hypoxemic respiratory failure secondary to COVID-19 virus: Continue to monitor due to significant symptoms  chest physiotherapy, incentive spirometry, deep breathing exercises, sputum induction, mucolytic's and bronchodilators. Supplemental oxygen to keep saturations more than 90%. Covid directed therapy with , steroids, on Solu-Medrol remdesivir, completed 5 days of therapy actemra /on baricitinib since 9/24 Due to severity of symptoms, patient will need daily inflammatory markers, chest x-rays, liver function test to monitor and direct COVID-19 therapies. CTA chest was negative for PE. Lower extremity duplex is positive for right lower extremity DVT. On Xarelto.  [place]    01/07/20 0455 01/07/20 0523 01/08/20 0402 01/09/20 0355  DDIMER 3.18*  --  1.72* 1.79*  FERRITIN  --  326 452* 518*  CRP  --  0.7 13.0* 15.8*    No results found for: SARSCOV2NAA   SpO2: (!) 81 % O2 Flow Rate (L/min): 10 L/min   Essential hypertension: Blood pressure fairly stable. Cozaar on hold.  Hyperlipidemia: On Crestor. Continued.  Sleep apnea: On CPAP using at night.   DVT prophylaxis: Place and maintain sequential compression device Start: 01/07/20 1315 Rivaroxaban  (XARELTO) tablet 15 mg  rivaroxaban (XARELTO) tablet 20 mg   Code Status: Full code Family Communication: Patient's wife on the phone Disposition Plan: Status is: Inpatient  Remains inpatient appropriate because:Inpatient level of care appropriate due to severity of illness   Dispo: The patient is from: Home              Anticipated d/c is to: Home              Anticipated d/c date is: > 3 days              Patient currently is not medically stable to d/c.         Consultants:   None  Procedures:   None  Antimicrobials:  Antibiotics Given (last 72 hours)    None       Finished remdesivir therapy  Subjective: Patient was seen and examined.  No overnight events.  Oxygen drops on minimal exertion or trying to eat.  He denies any other complaints. Objective: Vitals:   01/08/20 2019 01/08/20 2117 01/09/20 0246 01/09/20 0408  BP:  131/73  137/72  Pulse: 96 97  98  Resp: (!) 22 19  20   Temp: 98.6 F (37 C) 98.4 F (36.9 C)  97.8 F (36.6 C)  TempSrc: Oral Oral    SpO2: 91% (!) 81% (!) 89% (!) 81%  Weight:      Height:        Intake/Output Summary (Last 24 hours) at 01/09/2020 1354 Last data filed at 01/09/2020 1126 Gross per 24 hour  Intake 835 ml  Output 2250 ml  Net -1415 ml   Filed Weights   12/25/2019 1626  Weight: 102.1 kg  Examination:  General exam: Sick looking.  On 10 L of oxygen and saturating 82%.  Somehow anxious. Respiratory system: Poor bilateral air entry.  Poor breathing effort. Cardiovascular system: S1 & S2 heard, RRR. No JVD, murmurs, rubs, gallops or clicks. No pedal edema. Gastrointestinal system: Abdomen is nondistended, soft and nontender. No organomegaly or masses felt. Normal bowel sounds heard. Central nervous system: Alert and oriented. No focal neurological deficits. Extremities: Symmetric 5 x 5 power. Skin: No rashes, lesions or ulcers Psychiatry: Judgement and insight appear normal. Mood & affect appropriate.      Data Reviewed: I have personally reviewed following labs and imaging studies  CBC: Recent Labs  Lab 01/05/20 0545 01/06/20 0638 01/07/20 0455 01/08/20 0402 01/09/20 0355  WBC 5.8 7.0 10.0 9.0 8.9  NEUTROABS 5.0 6.2 9.0* 8.4* 8.4*  HGB 15.1 15.1 16.1 15.5 16.3  HCT 47.2 46.8 48.6 46.8 49.6  MCV 92.0 92.7 91.2 90.9 90.3  PLT 147* 143* 124* 102* 102*   Basic Metabolic Panel: Recent Labs  Lab 01/05/20 0545 01/06/20 0638 01/07/20 0523 01/08/20 0402 01/09/20 0355  NA 143 145 139 138 139  K 4.3 4.4 3.7 3.5 4.3  CL 107 108 105 104 103  CO2 27 26 24 24 25   GLUCOSE 209* 200* 128* 145* 198*  BUN 33* 37* 30* 25* 26*  CREATININE 0.91 0.95 0.88 0.86 0.82  CALCIUM 8.6* 8.7* 8.4* 8.2* 8.6*   GFR: Estimated Creatinine Clearance: 93.2 mL/min (by C-G formula based on SCr of 0.82 mg/dL). Liver Function Tests: Recent Labs  Lab 01/05/20 0545 01/06/20 5852 01/07/20 0523 01/08/20 0402 01/09/20 0355  AST 56* 58* 41 32 28  ALT 32 44 41 34 36  ALKPHOS 64 63 65 61 69  BILITOT 0.5 0.8 1.1 1.5* 1.2  PROT 5.9* 5.7* 5.1* 5.6* 6.1*  ALBUMIN 2.9* 3.0* 2.7* 3.0* 3.0*   No results for input(s): LIPASE, AMYLASE in the last 168 hours. No results for input(s): AMMONIA in the last 168 hours. Coagulation Profile: No results for input(s): INR, PROTIME in the last 168 hours. Cardiac Enzymes: No results for input(s): CKTOTAL, CKMB, CKMBINDEX, TROPONINI in the last 168 hours. BNP (last 3 results) No results for input(s): PROBNP in the last 8760 hours. HbA1C: No results for input(s): HGBA1C in the last 72 hours. CBG: Recent Labs  Lab 01/08/20 1111 01/08/20 1614 01/08/20 2118 01/09/20 0757 01/09/20 1124  GLUCAP 180* 189* 174* 186* 280*   Lipid Profile: No results for input(s): CHOL, HDL, LDLCALC, TRIG, CHOLHDL, LDLDIRECT in the last 72 hours. Thyroid Function Tests: No results for input(s): TSH, T4TOTAL, FREET4, T3FREE, THYROIDAB in the last 72 hours. Anemia Panel: Recent Labs     01/08/20 0402 01/09/20 0355  FERRITIN 452* 518*   Sepsis Labs: Recent Labs  Lab 12/16/2019 1800  PROCALCITON <0.10  LATICACIDVEN 0.8    Recent Results (from the past 240 hour(s))  Blood Culture (routine x 2)     Status: None   Collection Time: 01/03/2020  6:00 PM   Specimen: Site Not Specified; Blood  Result Value Ref Range Status   Specimen Description   Final    SITE NOT SPECIFIED Performed at [facility] [address], [place], [place] [address]    Special Requests   Final    BOTTLES DRAWN AEROBIC AND ANAEROBIC Blood Culture results may not be optimal due to an inadequate volume of blood received in culture bottles Performed at [facility] [address] [place], [place]  [address]    Culture   Final    NO GROWTH 5 DAYS Performed at [facility], [place] [address], [place], [place] [address]    Report Status 01/07/2020 FINAL  Final         Radiology Studies: DG CHEST PORT 1 VIEW  Result Date: 01/08/2020 CLINICAL DATA:  Pneumonia.  COVID positive. EXAM: PORTABLE CHEST 1 VIEW COMPARISON:  CT 01/03/2020.  12/25/2019. FINDINGS: Cardiomegaly. Diffuse bilateral pulmonary infiltrates/edema again noted. No interim change from prior exam. No pleural effusion or pneumothorax. IMPRESSION: 1. Cardiomegaly. 2. Diffuse bilateral pulmonary infiltrates/edema again noted. No interim change from prior exam. Electronically Signed   By: Marcello Moores  Register   On: 01/08/2020 09:15        Scheduled Meds: . alfuzosin  10 mg Oral q morning - 10a  . vitamin C  500 mg Oral Daily  . baricitinib  4 mg Oral Daily  . ferrous sulfate  325 mg Oral Q breakfast  . insulin aspart  0-9 Units Subcutaneous TID WC  . loratadine  10 mg Oral Daily  . methylPREDNISolone (SOLU-MEDROL) injection  50 mg Intravenous Q12H  . montelukast  10 mg Oral QHS  . Rivaroxaban  15 mg Oral BID WC   Followed by  . [START ON 01/28/2020] rivaroxaban  20 mg Oral Q supper  .  rosuvastatin  10 mg Oral Daily  . senna  1 tablet Oral BID  . vitamin B-12  500 mcg Oral Daily  . zinc sulfate  220 mg Oral Daily   Continuous Infusions: . sodium chloride       LOS: 7 days    Time spent: 35 minutes    Barb Merino, MD [facility] Pager [phone]

## 2020-01-09 NOTE — Progress Notes (Signed)
Nursing secretary Sonia Baller took patient belongings down to patient wife. Patient wife verified that patient wedding ring was in patients bag.

## 2020-01-09 NOTE — Progress Notes (Signed)
Pt having moderate difficulty with nasal stuffiness not improved very well with [place] Spr[place] Nasal saline while on 8Lnc. O2 sats in the low 80s.  Changed over to 15Lnc with increase of sats to 96%- will monitor and decrease as tolerated.

## 2020-01-09 NOTE — Progress Notes (Signed)
Patient removed his wedding band, and placed in a specimen cup. His wife will be brining some clothes and other hygiene products to him later today. She will also be picking up his dirty clothes and his wedding band later today.

## 2020-01-09 NOTE — Progress Notes (Signed)
Physical Therapy Treatment Patient Details Name: Connor [name] MRN: [id number] DOB: 1945-11-03 Today's Date: 01/09/2020    History of Present Illness 74 y.o. male with medical history significant of Polycythemia Vera, BPH, mild COPD, carotid artery disease was diagnosed as COVID positive on 12/27/19. He was scheduled for and did receive monoclonal antibody infusion 01/07/2020. After treatment his O2 sat was 85%. He was referred to [facility] on 9/22 for evaluation and admission. He is unvaccinated. CXR with mild multifocal ground glass infiltrates.    PT Comments    Pt motivated to mobilize, asking to brush his teeth however limited by weakness and decr O2 saturations with minimal activity. Pt was able to get up to chair. Reviewed proper breathing techniques and encouraged IS. Pt  on 15L HFNC. SpO2= 88%--73%(with EOB)--86%, HR max 117  Follow Up Recommendations  Home health PT;Supervision for mobility/OOB     Equipment Recommendations  Other (comment) (TBD)    Recommendations for Other Services       Precautions / Restrictions Precautions Precautions: Fall Precaution Comments: monitor VS Restrictions Weight Bearing Restrictions: No    Mobility  Bed Mobility Overal bed mobility: Needs Assistance Bed Mobility: Supine to Sit     Supine to sit: Supervision     General bed mobility comments: for safety, increased time especially to scoot to EOB with use of bedrails to come to sitting.  Transfers Overall transfer level: Needs assistance Equipment used: None Transfers: Sit to/from [facility] Sit to Stand: Min assist Stand pivot transfers: Min assist       General transfer comment: 2 attempts d/t weakness/fatigue. assist to rise and stabilize, cues for safety and hand placement , incr time   Ambulation/Gait             General Gait Details: NT today d/t decr O2 saturations with sitting EOB   Stairs             Wheelchair Mobility    Modified  Rankin (Stroke Patients Only)       Balance Overall balance assessment: Needs assistance Sitting-balance support: No upper extremity supported;Feet supported Sitting balance-Leahy Scale: Fair     Standing balance support: Bilateral upper extremity supported;During functional activity Standing balance-Leahy Scale: Poor Standing balance comment: reliant on UEs and external assist                             Cognition Arousal/Alertness: Awake/alert Behavior During Therapy: WFL for tasks assessed/performed Overall Cognitive Status: Within Functional Limits for tasks assessed                                        Exercises      General Comments General comments (skin integrity, edema, etc.): on 15L HFNC. SpO2= 88%--73%--86%, HR max 117      Pertinent Vitals/Pain Pain Assessment: No/denies pain    Home Living                      Prior Function            PT Goals (current goals can now be found in the care plan section) Acute Rehab PT Goals Patient Stated Goal: breathe better, go home PT Goal Formulation: With patient Time For Goal Achievement: 01/19/20 Potential to Achieve Goals: Good Progress towards PT goals: Not progressing toward goals - comment (medical  issues)    Frequency    Min 3X/week      PT Plan Current plan remains appropriate    Co-evaluation              AM-PAC PT "6 Clicks" Mobility   Outcome Measure  Help needed turning from your back to your side while in a flat bed without using bedrails?: None Help needed moving from lying on your back to sitting on the side of a flat bed without using bedrails?: None Help needed moving to and from a bed to a chair (including a wheelchair)?: A Little Help needed standing up from a chair using your arms (e.g., wheelchair or bedside chair)?: A Little Help needed to walk in hospital room?: A Lot Help needed climbing 3-5 steps with a railing? : A Lot 6 Click  Score: 18    End of Session Equipment Utilized During Treatment: Oxygen Activity Tolerance: Patient limited by fatigue;Treatment limited secondary to medical complications (Comment) (O2 desaturations) Patient left: in chair;with call bell/phone within reach;with chair alarm set Nurse Communication: Mobility status PT Visit Diagnosis: Unsteadiness on feet (R26.81);Difficulty in walking, not elsewhere classified (R26.2)     Time: 6122-4497 PT Time Calculation (min) (ACUTE ONLY): 24 min  Charges:  $Therapeutic Activity: 23-37 mins                     Baxter Flattery, PT  Acute Rehab Dept ([facility]) [phone] Pager [phone]  01/09/2020    [facility] 01/09/2020, 2:46 PM

## 2020-01-10 ENCOUNTER — Other Ambulatory Visit (HOSPITAL_COMMUNITY): Payer: Medicare Other

## 2020-01-10 LAB — GLUCOSE, CAPILLARY
Glucose-Capillary: 180 mg/dL — ABNORMAL HIGH (ref 70–99)
Glucose-Capillary: 185 mg/dL — ABNORMAL HIGH (ref 70–99)
Glucose-Capillary: 201 mg/dL — ABNORMAL HIGH (ref 70–99)

## 2020-01-10 LAB — COMPREHENSIVE METABOLIC PANEL
ALT: 63 U/L — ABNORMAL HIGH (ref 0–44)
AST: 45 U/L — ABNORMAL HIGH (ref 15–41)
Albumin: 2.6 g/dL — ABNORMAL LOW (ref 3.5–5.0)
Alkaline Phosphatase: 59 U/L (ref 38–126)
Anion gap: 10 (ref 5–15)
BUN: 30 mg/dL — ABNORMAL HIGH (ref 8–23)
CO2: 23 mmol/L (ref 22–32)
Calcium: 8.4 mg/dL — ABNORMAL LOW (ref 8.9–10.3)
Chloride: 102 mmol/L (ref 98–111)
Creatinine, Ser: 0.85 mg/dL (ref 0.61–1.24)
GFR calc Af Amer: >60 mL/min (ref >60)
GFR calc non Af Amer: >60 mL/min (ref >60)
Glucose, Bld: 191 mg/dL — ABNORMAL HIGH (ref 70–99)
Potassium: 4.4 mmol/L (ref 3.5–5.1)
Sodium: 135 mmol/L (ref 135–145)
Total Bilirubin: 1.2 mg/dL (ref 0.3–1.2)
Total Protein: 5.6 g/dL — ABNORMAL LOW (ref 6.5–8.1)

## 2020-01-10 LAB — CBC WITH DIFFERENTIAL/PLATELET
Abs Immature Granulocytes: 0.22 10*3/uL — ABNORMAL HIGH (ref 0.00–0.07)
Basophils Absolute: 0 10*3/uL (ref 0.0–0.1)
Basophils Relative: 0 %
Eosinophils Absolute: 0 10*3/uL (ref 0.0–0.5)
Eosinophils Relative: 0 %
HCT: 46 % (ref 39.0–52.0)
Hemoglobin: 15.4 g/dL (ref 13.0–17.0)
Immature Granulocytes: 2 %
Lymphocytes Relative: 2 %
Lymphs Abs: 0.3 10*3/uL — ABNORMAL LOW (ref 0.7–4.0)
MCH: 29.8 pg (ref 26.0–34.0)
MCHC: 33.5 g/dL (ref 30.0–36.0)
MCV: 89 fL (ref 80.0–100.0)
Monocytes Absolute: 0.3 10*3/uL (ref 0.1–1.0)
Monocytes Relative: 3 %
Neutro Abs: 11.8 10*3/uL — ABNORMAL HIGH (ref 1.7–7.7)
Neutrophils Relative %: 93 %
Platelets: 167 10*3/uL (ref 150–400)
RBC: 5.17 MIL/uL (ref 4.22–5.81)
RDW: 11.9 % (ref 11.5–15.5)
WBC: 12.6 10*3/uL — ABNORMAL HIGH (ref 4.0–10.5)
nRBC: 0 % (ref 0.0–0.2)

## 2020-01-10 LAB — FERRITIN: Ferritin: 441 ng/mL — ABNORMAL HIGH (ref 24–336)

## 2020-01-10 LAB — C-REACTIVE PROTEIN: CRP: 4.3 mg/dL — ABNORMAL HIGH (ref <1.0)

## 2020-01-10 LAB — D-DIMER, QUANTITATIVE: D-Dimer, Quant: 1.42 ug/mL-FEU — ABNORMAL HIGH (ref 0.00–0.50)

## 2020-01-10 MED ORDER — FUROSEMIDE 10 MG/ML IJ SOLN
40.0000 mg | Freq: Two times a day (BID) | INTRAMUSCULAR | Status: DC
  Administered 2020-01-10 – 2020-01-19 (×19): 40 mg via INTRAVENOUS
  Filled 2020-01-10 (×19): qty 4

## 2020-01-10 NOTE — Progress Notes (Signed)
Notified patient wife Hoyle Sauer) to update on room change, plan of care, and patient's status. No distress noted will continue present plan of care.

## 2020-01-10 NOTE — Progress Notes (Signed)
Received report at 42 from [place], [place] and pt arrived to unit at [facility]. Pt is alert and oriented times 3, no c/o pain or discomfort. Continue on HF 15 with NRB 15L Sats at 95, continue on tele and salin lock, skin is intact and condom catheter in place, noted. All bell in reach and bed at lowest position. Pt is tachy and saline lock at this time.

## 2020-01-10 NOTE — Progress Notes (Signed)
PROGRESS NOTE    Connor [name]  WFU:[id number] DOB: [birth year] DOA: 01/05/2020 PCP: Dettinger, Fransisca Kaufmann, MD    Brief Narrative:  74 year old male with polycythemia vera, BPH, mild COPD, OSA on CPAP at home , coronary artery disease diagnosed with Covid on 9/16. Initially mild symptoms. Went to receive monoclonal antibody infusion on 9/22. After treatment oxygen was 85% so referred to ER. Unvaccinated against COVID-19. Chest x-ray with bilateral multifocal groundglass infiltrates.   Assessment & Plan:   Principal Problem:   Pneumonia due to COVID-19 virus Active Problems:   Essential hypertension, benign   OSA (obstructive sleep apnea)   BPH (benign prostatic hyperplasia)   Hyperlipidemia LDL goal <100  Acute hypoxemic respiratory failure secondary to COVID-19 virus: Continue to monitor due to significant symptoms , patient is still on significant oxygen requirement. chest physiotherapy, incentive spirometry, deep breathing exercises, sputum induction, mucolytic's and bronchodilators. Supplemental oxygen to keep saturations more than 85 %. Covid directed therapy with , steroids, on high-dose Solu-Medrol remdesivir, completed 5 days of therapy actemra /on baricitinib since 9/24 Due to severity of symptoms, patient will need daily inflammatory markers, chest x-rays, liver function test to monitor and direct COVID-19 therapies. CTA chest was negative for PE. Lower extremity duplex is positive for right lower extremity DVT. On Xarelto. We will try IV diuresis today. Does have history of ischemic heart disease, previous normal ejection fraction.  Will check limited echocardiogram to see left ventricular ejection fraction.  COVID-19 Labs  Recent Labs    01/08/20 0402 01/09/20 0355 01/10/20 0402  DDIMER 1.72* 1.79* 1.42*  FERRITIN 452* 518* 441*  CRP 13.0* 15.8* 4.3*    No results found for: SARSCOV2NAA   SpO2: (!) 86 % O2 Flow Rate (L/min): 15 L/min FiO2 (%): 100 %    Essential hypertension: Blood pressure fairly stable. Cozaar on hold.  Hyperlipidemia: On Crestor. Continued.  Sleep apnea: Not able to use CPAP at night.  Will provide CPAP as much possible.   DVT prophylaxis: Place and maintain sequential compression device Start: 01/07/20 1315 Rivaroxaban (XARELTO) tablet 15 mg  rivaroxaban (XARELTO) tablet 20 mg   Code Status: Full code Family Communication: Patient's wife on the phone, called and updated.  Updated about very critical condition. Disposition Plan: Status is: Inpatient  Remains inpatient appropriate because:Inpatient level of care appropriate due to severity of illness   Dispo: The patient is from: Home              Anticipated d/c is to: Home              Anticipated d/c date is: > 3 days              Patient currently is not medically stable to d/c.         Consultants:   None  Procedures:   None  Antimicrobials:  Antibiotics Given (last 72 hours)    None       Finished remdesivir therapy  Subjective: Patient seen and examined.  No overnight events.  Oxygen level drops and become short of breath even on eating or taking few bites of food.  Remains in very critical condition with low oxygen. Objective: Vitals:   01/09/20 2109 01/10/20 0524 01/10/20 0804 01/10/20 1100  BP: (!) 143/72 126/76    Pulse: 98 96    Resp: 17 15    Temp: 98.6 F (37 C) 97.7 F (36.5 C)    TempSrc: Oral Oral    SpO2: (!) 86% [name])  85% (!) 83% (!) 86%  Weight:      Height:        Intake/Output Summary (Last 24 hours) at 01/10/2020 1152 Last data filed at 01/10/2020 0925 Gross per 24 hour  Intake 240 ml  Output 2825 ml  Net -2585 ml   Filed Weights   12/26/2019 1626  Weight: 102.1 kg    Examination:  General exam: Sick looking.  On 15 L oxygen.  Somewhat anxious. Respiratory system: Poor breathing effort with poor bilateral air entry. Cardiovascular system: S1 & S2 heard, RRR. No JVD, murmurs, rubs, gallops or  clicks. No pedal edema. Gastrointestinal system: Abdomen is nondistended, soft and nontender. No organomegaly or masses felt. Normal bowel sounds heard. Central nervous system: Alert and oriented. No focal neurological deficits. Extremities: Symmetric 5 x 5 power. Skin: No rashes, lesions or ulcers Psychiatry: Judgement and insight appear normal. Mood & affect anxious.    Data Reviewed: I have personally reviewed following labs and imaging studies  CBC: Recent Labs  Lab 01/06/20 0638 01/07/20 0455 01/08/20 0402 01/09/20 0355 01/10/20 0402  WBC 7.0 10.0 9.0 8.9 12.6*  NEUTROABS 6.2 9.0* 8.4* 8.4* 11.8*  HGB 15.1 16.1 15.5 16.3 15.4  HCT 46.8 48.6 46.8 49.6 46.0  MCV 92.7 91.2 90.9 90.3 89.0  PLT 143* 124* 102* 138* 007   Basic Metabolic Panel: Recent Labs  Lab 01/06/20 0638 01/07/20 0523 01/08/20 0402 01/09/20 0355 01/10/20 0402  NA 145 139 138 139 135  K 4.4 3.7 3.5 4.3 4.4  CL 108 105 104 103 102  CO2 26 24 24 25 23   GLUCOSE 200* 128* 145* 198* 191*  BUN 37* 30* 25* 26* 30*  CREATININE 0.95 0.88 0.86 0.82 0.85  CALCIUM 8.7* 8.4* 8.2* 8.6* 8.4*   GFR: Estimated Creatinine Clearance: 89.9 mL/min (by C-G formula based on SCr of 0.85 mg/dL). Liver Function Tests: Recent Labs  Lab 01/06/20 6226 01/07/20 0523 01/08/20 0402 01/09/20 0355 01/10/20 0402  AST 58* 41 32 28 45*  ALT 44 41 34 36 63*  ALKPHOS 63 65 61 69 59  BILITOT 0.8 1.1 1.5* 1.2 1.2  PROT 5.7* 5.1* 5.6* 6.1* 5.6*  ALBUMIN 3.0* 2.7* 3.0* 3.0* 2.6*   No results for input(s): LIPASE, AMYLASE in the last 168 hours. No results for input(s): AMMONIA in the last 168 hours. Coagulation Profile: No results for input(s): INR, PROTIME in the last 168 hours. Cardiac Enzymes: No results for input(s): CKTOTAL, CKMB, CKMBINDEX, TROPONINI in the last 168 hours. BNP (last 3 results) No results for input(s): PROBNP in the last 8760 hours. HbA1C: No results for input(s): HGBA1C in the last 72  hours. CBG: Recent Labs  Lab 01/09/20 1124 01/09/20 1606 01/09/20 2215 01/10/20 0747 01/10/20 1148  GLUCAP 280* 205* 190* 180* 185*   Lipid Profile: No results for input(s): CHOL, HDL, LDLCALC, TRIG, CHOLHDL, LDLDIRECT in the last 72 hours. Thyroid Function Tests: No results for input(s): TSH, T4TOTAL, FREET4, T3FREE, THYROIDAB in the last 72 hours. Anemia Panel: Recent Labs    01/09/20 0355 01/10/20 0402  FERRITIN 518* 441*   Sepsis Labs: No results for input(s): PROCALCITON, LATICACIDVEN in the last 168 hours.  Recent Results (from the past 240 hour(s))  Blood Culture (routine x 2)     Status: None   Collection Time: 12/18/2019  6:00 PM   Specimen: Site Not Specified; Blood  Result Value Ref Range Status   Specimen Description   Final    SITE NOT SPECIFIED Performed at  [facility], [place] [address], [place], [place] [address]    Special Requests   Final    BOTTLES DRAWN AEROBIC AND ANAEROBIC Blood Culture results may not be optimal due to an inadequate volume of blood received in culture bottles Performed at [place] [address] [place], [place] [address]    Culture   Final    NO GROWTH 5 DAYS Performed at [facility], [place] [address] [place], [place] [address]    Report Status 01/07/2020 FINAL  Final         Radiology Studies: No results found.      Scheduled Meds: . alfuzosin  10 mg Oral q morning - 10a  . vitamin C  500 mg Oral Daily  . baricitinib  4 mg Oral Daily  . ferrous sulfate  325 mg Oral Q breakfast  . furosemide  40 mg Intravenous Q12H  . insulin aspart  0-9 Units Subcutaneous TID WC  . loratadine  10 mg Oral Daily  . methylPREDNISolone (SOLU-MEDROL) injection  50 mg Intravenous Q12H  . montelukast  10 mg Oral QHS  . Rivaroxaban  15 mg Oral BID WC   Followed by  . [START ON 01/28/2020] rivaroxaban  20 mg Oral Q supper  . rosuvastatin  10 mg Oral Daily  . senna  1 tablet Oral BID  .  vitamin B-12  500 mcg Oral Daily  . zinc sulfate  220 mg Oral Daily   Continuous Infusions: . sodium chloride       LOS: 8 days    Time spent: 35 minutes    Barb Merino, MD [facility] Pager [phone]

## 2020-01-10 NOTE — Progress Notes (Signed)
OT Cancellation Note  Patient Details Name: Connor [name] MRN: [id number] DOB: [birth year]   Cancelled Treatment:    Reason Eval/Treat Not Completed: Medical issues which prohibited therapy. RN reports patient being transferred to ICU. Will continue to follow for medical stability.  Delbert Phenix OT OT pager: [phone]   Rosemary Holms 01/10/2020, 2:35 PM

## 2020-01-11 ENCOUNTER — Inpatient Hospital Stay (HOSPITAL_COMMUNITY): Payer: Medicare Other

## 2020-01-11 DIAGNOSIS — J9601 Acute respiratory failure with hypoxia: Secondary | ICD-10-CM

## 2020-01-11 LAB — D-DIMER, QUANTITATIVE: D-Dimer, Quant: 1.08 ug/mL-FEU — ABNORMAL HIGH (ref 0.00–0.50)

## 2020-01-11 LAB — CBC WITH DIFFERENTIAL/PLATELET
Abs Immature Granulocytes: 0.18 10*3/uL — ABNORMAL HIGH (ref 0.00–0.07)
Basophils Absolute: 0 10*3/uL (ref 0.0–0.1)
Basophils Relative: 0 %
Eosinophils Absolute: 0 10*3/uL (ref 0.0–0.5)
Eosinophils Relative: 0 %
HCT: 49.7 % (ref 39.0–52.0)
Hemoglobin: 16.7 g/dL (ref 13.0–17.0)
Immature Granulocytes: 1 %
Lymphocytes Relative: 1 %
Lymphs Abs: 0.2 10*3/uL — ABNORMAL LOW (ref 0.7–4.0)
MCH: 29.9 pg (ref 26.0–34.0)
MCHC: 33.6 g/dL (ref 30.0–36.0)
MCV: 88.9 fL (ref 80.0–100.0)
Monocytes Absolute: 0.5 10*3/uL (ref 0.1–1.0)
Monocytes Relative: 4 %
Neutro Abs: 12.2 10*3/uL — ABNORMAL HIGH (ref 1.7–7.7)
Neutrophils Relative %: 94 %
Platelets: 209 10*3/uL (ref 150–400)
RBC: 5.59 MIL/uL (ref 4.22–5.81)
RDW: 12 % (ref 11.5–15.5)
WBC: 13.1 10*3/uL — ABNORMAL HIGH (ref 4.0–10.5)
nRBC: 0 % (ref 0.0–0.2)

## 2020-01-11 LAB — C-REACTIVE PROTEIN: CRP: 1.5 mg/dL — ABNORMAL HIGH (ref <1.0)

## 2020-01-11 LAB — COMPREHENSIVE METABOLIC PANEL
ALT: 74 U/L — ABNORMAL HIGH (ref 0–44)
AST: 31 U/L (ref 15–41)
Albumin: 2.9 g/dL — ABNORMAL LOW (ref 3.5–5.0)
Alkaline Phosphatase: 62 U/L (ref 38–126)
Anion gap: 13 (ref 5–15)
BUN: 33 mg/dL — ABNORMAL HIGH (ref 8–23)
CO2: 24 mmol/L (ref 22–32)
Calcium: 8.5 mg/dL — ABNORMAL LOW (ref 8.9–10.3)
Chloride: 100 mmol/L (ref 98–111)
Creatinine, Ser: 0.97 mg/dL (ref 0.61–1.24)
GFR calc Af Amer: >60 mL/min (ref >60)
GFR calc non Af Amer: >60 mL/min (ref >60)
Glucose, Bld: 185 mg/dL — ABNORMAL HIGH (ref 70–99)
Potassium: 4.4 mmol/L (ref 3.5–5.1)
Sodium: 137 mmol/L (ref 135–145)
Total Bilirubin: 1.4 mg/dL — ABNORMAL HIGH (ref 0.3–1.2)
Total Protein: 5.9 g/dL — ABNORMAL LOW (ref 6.5–8.1)

## 2020-01-11 LAB — FERRITIN: Ferritin: 317 ng/mL (ref 24–336)

## 2020-01-11 LAB — ECHOCARDIOGRAM LIMITED
Height: 71 in
Weight: 3600 oz

## 2020-01-11 LAB — GLUCOSE, CAPILLARY
Glucose-Capillary: 155 mg/dL — ABNORMAL HIGH (ref 70–99)
Glucose-Capillary: 275 mg/dL — ABNORMAL HIGH (ref 70–99)
Glucose-Capillary: 244 mg/dL — ABNORMAL HIGH (ref 70–99)

## 2020-01-11 NOTE — Progress Notes (Signed)
Echocardiogram 2D Echocardiogram has been performed.  Oneal Deputy Brandolyn Shortridge 01/11/2020, 8:04 AM

## 2020-01-11 NOTE — Progress Notes (Signed)
PROGRESS NOTE    Connor [name]  MEQ:[id number] DOB: July 29, 1945 DOA: 01/01/2020 PCP: Dettinger, Fransisca Kaufmann, MD    Brief Narrative:  74 year old male with polycythemia vera, BPH, mild COPD, OSA on CPAP at home , coronary artery disease diagnosed with Covid on 9/16. Initially mild symptoms. Went to receive monoclonal antibody infusion on 9/22. After treatment oxygen was 85% so referred to ER. Unvaccinated against COVID-19. Chest x-ray with bilateral multifocal groundglass infiltrates.   Assessment & Plan:   Principal Problem:   Pneumonia due to COVID-19 virus Active Problems:   Essential hypertension, benign   OSA (obstructive sleep apnea)   BPH (benign prostatic hyperplasia)   Hyperlipidemia LDL goal <100  Acute hypoxemic respiratory failure secondary to COVID-19 virus: Continue to monitor due to significant symptoms , patient is still on significant oxygen requirement.  Along with BiPAP requirement at night. chest physiotherapy, incentive spirometry, deep breathing exercises, sputum induction, mucolytic's and bronchodilators. Supplemental oxygen to keep saturations more than 82%. Covid directed therapy with , steroids, on high-dose Solu-Medrol remdesivir, completed 5 days of therapy actemra /on baricitinib since 9/24 Due to severity of symptoms, patient will need daily inflammatory markers, chest x-rays, liver function test to monitor and direct COVID-19 therapies. CTA chest was negative for PE. Lower extremity duplex is positive for right lower extremity DVT. On Xarelto. Continue IV diuresis trial. Echocardiogram with normal ejection fraction.  COVID-19 Labs  Recent Labs    01/09/20 0355 01/10/20 0402 01/11/20 0458  DDIMER 1.79* 1.42* 1.08*  FERRITIN 518* 441* 317  CRP 15.8* 4.3* 1.5*    No results found for: SARSCOV2NAA   SpO2: 91 % O2 Flow Rate (L/min): 15 L/min (+ NRB ) FiO2 (%): 100 %   Essential hypertension: Blood pressure fairly stable. Cozaar on  hold.  Hyperlipidemia: On Crestor. Continued.  Sleep apnea: Using BiPAP at night with high flow oxygen.   DVT prophylaxis: Place and maintain sequential compression device Start: 01/07/20 1315 Rivaroxaban (XARELTO) tablet 15 mg  rivaroxaban (XARELTO) tablet 20 mg   Code Status: Full code Family Communication: Patient's wife on the phone, called and updated.  Updated about very critical condition. Disposition Plan: Status is: Inpatient  Remains inpatient appropriate because:Inpatient level of care appropriate due to severity of illness   Dispo: The patient is from: Home              Anticipated d/c is to: Home              Anticipated d/c date is: > 3 days              Patient currently is not medically stable to d/c.         Consultants:   None  Procedures:   None  Antimicrobials:  Antibiotics Given (last 72 hours)    None       Finished remdesivir therapy  Subjective: Seen and examined.  Overnight he wore BiPAP and he did feel better.  However daytime he is needing both high flow nasal cannula oxygen as well as nonrebreather.  Breathing is about the same, he is feeling slightly more energetic. Objective: Vitals:   01/11/20 0605 01/11/20 0851 01/11/20 1142 01/11/20 1207  BP: (!) 141/74  125/75   Pulse: 96  (!) 110 (!) 109  Resp: 18  18   Temp: 97.7 F (36.5 C)  98 F (36.7 C)   TempSrc:   Oral   SpO2: 96% (!) 84% 90% 91%  Weight:  Height:        Intake/Output Summary (Last 24 hours) at 01/11/2020 1509 Last data filed at 01/11/2020 1145 Gross per 24 hour  Intake 360 ml  Output 3550 ml  Net -3190 ml   Filed Weights   12/29/2019 1626  Weight: 102.1 kg    Examination:  General exam: Sick looking, able to communicate.  On 15 L oxygen cannula as well on nonrebreather. Respiratory system: Poor breathing effort with poor bilateral air entry. Cardiovascular system: S1 & S2 heard, RRR. No JVD, murmurs, rubs, gallops or clicks. No pedal  edema. Gastrointestinal system: Abdomen is nondistended, soft and nontender. No organomegaly or masses felt. Normal bowel sounds heard. Central nervous system: Alert and oriented. No focal neurological deficits. Extremities: Symmetric 5 x 5 power. Skin: No rashes, lesions or ulcers Psychiatry: Judgement and insight appear normal. Mood & affect anxious.    Data Reviewed: I have personally reviewed following labs and imaging studies  CBC: Recent Labs  Lab 01/07/20 0455 01/08/20 0402 01/09/20 0355 01/10/20 0402 01/11/20 0458  WBC 10.0 9.0 8.9 12.6* 13.1*  NEUTROABS 9.0* 8.4* 8.4* 11.8* 12.2*  HGB 16.1 15.5 16.3 15.4 16.7  HCT 48.6 46.8 49.6 46.0 49.7  MCV 91.2 90.9 90.3 89.0 88.9  PLT 124* 102* 138* 167 371   Basic Metabolic Panel: Recent Labs  Lab 01/07/20 0523 01/08/20 0402 01/09/20 0355 01/10/20 0402 01/11/20 0458  NA 139 138 139 135 137  K 3.7 3.5 4.3 4.4 4.4  CL 105 104 103 102 100  CO2 24 24 25 23 24   GLUCOSE 128* 145* 198* 191* 185*  BUN 30* 25* 26* 30* 33*  CREATININE 0.88 0.86 0.82 0.85 0.97  CALCIUM 8.4* 8.2* 8.6* 8.4* 8.5*   GFR: Estimated Creatinine Clearance: 78.8 mL/min (by C-G formula based on SCr of 0.97 mg/dL). Liver Function Tests: Recent Labs  Lab 01/07/20 0523 01/08/20 0402 01/09/20 0355 01/10/20 0402 01/11/20 0458  AST 41 32 28 45* 31  ALT 41 34 36 63* 74*  ALKPHOS 65 61 69 59 62  BILITOT 1.1 1.5* 1.2 1.2 1.4*  PROT 5.1* 5.6* 6.1* 5.6* 5.9*  ALBUMIN 2.7* 3.0* 3.0* 2.6* 2.9*   No results for input(s): LIPASE, AMYLASE in the last 168 hours. No results for input(s): AMMONIA in the last 168 hours. Coagulation Profile: No results for input(s): INR, PROTIME in the last 168 hours. Cardiac Enzymes: No results for input(s): CKTOTAL, CKMB, CKMBINDEX, TROPONINI in the last 168 hours. BNP (last 3 results) No results for input(s): PROBNP in the last 8760 hours. HbA1C: No results for input(s): HGBA1C in the last 72 hours. CBG: Recent Labs   Lab 01/10/20 0747 01/10/20 1148 01/10/20 1655 01/11/20 0909 01/11/20 1134  GLUCAP 180* 185* 201* 155* 275*   Lipid Profile: No results for input(s): CHOL, HDL, LDLCALC, TRIG, CHOLHDL, LDLDIRECT in the last 72 hours. Thyroid Function Tests: No results for input(s): TSH, T4TOTAL, FREET4, T3FREE, THYROIDAB in the last 72 hours. Anemia Panel: Recent Labs    01/10/20 0402 01/11/20 0458  FERRITIN 441* 317   Sepsis Labs: No results for input(s): PROCALCITON, LATICACIDVEN in the last 168 hours.  Recent Results (from the past 240 hour(s))  Blood Culture (routine x 2)     Status: None   Collection Time: 12/25/2019  6:00 PM   Specimen: Site Not Specified; Blood  Result Value Ref Range Status   Specimen Description   Final    SITE NOT SPECIFIED Performed at [facility]  [address] [place], [place] [address]    Special Requests   Final    BOTTLES DRAWN AEROBIC AND ANAEROBIC Blood Culture results may not be optimal due to an inadequate volume of blood received in culture bottles Performed at [facility] [address] [place], [place] [address]    Culture   Final    NO GROWTH 5 DAYS Performed at [facility], [place] [address] [place],  [address]    Report Status 01/07/2020 FINAL  Final         Radiology Studies: ECHOCARDIOGRAM LIMITED  Result Date: 01/11/2020    ECHOCARDIOGRAM LIMITED REPORT   Patient Name:   SANTI TROUNG Date of Exam: 01/11/2020 Medical Rec #:  [id number]       Height:       71.0 in Accession #:    [id number]      Weight:       225.0 lb Date of Birth:  26-Nov-1945       BSA:          2.217 m Patient Age:    48 years        BP:           141/74 mmHg Patient Gender: M               HR:           97 bpm. Exam Location:  Inpatient Procedure: Limited Echo and Color Doppler Indications:    Acute Respiratory Insufficiency R06.89  History:        Patient has prior history of Echocardiogram examinations, most                  recent 02/11/2014. COPD; Risk Factors:Hypertension, Dyslipidemia,                 Sleep Apnea and COVID+ 12/27/19.  Sonographer:    Raquel Sarna Senior RDCS Referring Phys: [id number] [place]  1. Limited study for LV function; full doppler not performed; vigorous LV systolic function.  2. Left ventricular ejection fraction, by estimation, is 70 to 75%. The left ventricle has hyperdynamic function. There is mild left ventricular hypertrophy of the basal-septal segment.  3. Right ventricular systolic function is normal. The right ventricular size is normal.  4. The mitral valve is normal in structure. No evidence of mitral valve regurgitation.  5. The aortic valve is tricuspid. Aortic valve regurgitation is not visualized.  6. The inferior vena cava is normal in size with greater than 50% respiratory variability, suggesting right atrial pressure of 3 mmHg. FINDINGS  Left Ventricle: Left ventricular ejection fraction, by estimation, is 70 to 75%. The left ventricle has hyperdynamic function. The left ventricular internal cavity size was normal in size. There is mild left ventricular hypertrophy of the basal-septal segment. Right Ventricle: The right ventricular size is normal. Right ventricular systolic function is normal. Left Atrium: Left atrial size was normal in size. Right Atrium: Right atrial size was normal in size. Pericardium: There is no evidence of pericardial effusion. Mitral Valve: The mitral valve is normal in structure. Tricuspid Valve: The tricuspid valve is normal in structure. Aortic Valve: The aortic valve is tricuspid. Aortic valve regurgitation is not visualized. Pulmonic Valve: The pulmonic valve was normal in structure. Venous: The inferior vena cava is normal in size with greater than 50% respiratory variability, suggesting right atrial pressure of 3 mmHg. Additional Comments: Limited study for LV function; full doppler not performed; vigorous LV systolic function.  RIGHT  VENTRICLE RV S prime:     24.10 cm/s TAPSE (M-mode): 2.5 cm Kirk Ruths MD Electronically signed by Kirk Ruths MD Signature Date/Time: 01/11/2020/11:29:23 AM    Final         Scheduled Meds: . alfuzosin  10 mg Oral q morning - 10a  . vitamin C  500 mg Oral Daily  . baricitinib  4 mg Oral Daily  . ferrous sulfate  325 mg Oral Q breakfast  . furosemide  40 mg Intravenous Q12H  . insulin aspart  0-9 Units Subcutaneous TID WC  . loratadine  10 mg Oral Daily  . methylPREDNISolone (SOLU-MEDROL) injection  50 mg Intravenous Q12H  . montelukast  10 mg Oral QHS  . Rivaroxaban  15 mg Oral BID WC   Followed by  . [START ON 01/28/2020] rivaroxaban  20 mg Oral Q supper  . rosuvastatin  10 mg Oral Daily  . senna  1 tablet Oral BID  . vitamin B-12  500 mcg Oral Daily  . zinc sulfate  220 mg Oral Daily   Continuous Infusions: . sodium chloride       LOS: 9 days    Time spent: 35 minutes    Barb Merino, MD [facility] Pager [phone]

## 2020-01-11 NOTE — Plan of Care (Signed)
  Problem: Education: Goal: Knowledge of risk factors and measures for prevention of condition will improve Outcome: Progressing   Problem: Coping: Goal: Psychosocial and spiritual needs will be supported Outcome: Progressing   Problem: Respiratory: Goal: Will maintain a patent airway Outcome: Progressing Goal: Complications related to the disease process, condition or treatment will be avoided or minimized Outcome: Progressing   Problem: Clinical Measurements: Goal: Respiratory complications will improve Outcome: Not Progressing   Problem: Activity: Goal: Risk for activity intolerance will decrease Outcome: Not Progressing   Problem: Nutrition: Goal: Adequate nutrition will be maintained Outcome: Progressing   Problem: Coping: Goal: Level of anxiety will decrease Outcome: Progressing   Problem: Pain Managment: Goal: General experience of comfort will improve Outcome: Progressing

## 2020-01-11 NOTE — Progress Notes (Signed)
RN paged Dr. Sloan Leiter regarding pt's O2 sats and potential use for heated high flow oxygen. Pt wore BIPAP for 1.5 hours while taking a nap. When pt puts HFNC at 15L back on, along with NRB mask, pt continues to sat approximately 83-84%. MD to reduce O2 sat goal to 82%. RN will continue to monitor.

## 2020-01-11 NOTE — Progress Notes (Signed)
Patient removed from BiPAP at this time and placed on 15 L [facility] and 15L  NRB. Oxygen sat decreased to 81% during transition, but increased to 84% quickly. Patient not in distress at this time.

## 2020-01-11 DEATH — deceased

## 2020-01-12 LAB — GLUCOSE, CAPILLARY
Glucose-Capillary: 170 mg/dL — ABNORMAL HIGH (ref 70–99)
Glucose-Capillary: 236 mg/dL — ABNORMAL HIGH (ref 70–99)
Glucose-Capillary: 315 mg/dL — ABNORMAL HIGH (ref 70–99)

## 2020-01-12 LAB — CBC WITH DIFFERENTIAL/PLATELET
Abs Immature Granulocytes: 0.14 10*3/uL — ABNORMAL HIGH (ref 0.00–0.07)
Basophils Absolute: 0 10*3/uL (ref 0.0–0.1)
Basophils Relative: 0 %
Eosinophils Absolute: 0 10*3/uL (ref 0.0–0.5)
Eosinophils Relative: 0 %
HCT: 51 % (ref 39.0–52.0)
Hemoglobin: 16.9 g/dL (ref 13.0–17.0)
Immature Granulocytes: 1 %
Lymphocytes Relative: 2 %
Lymphs Abs: 0.2 10*3/uL — ABNORMAL LOW (ref 0.7–4.0)
MCH: 29.6 pg (ref 26.0–34.0)
MCHC: 33.1 g/dL (ref 30.0–36.0)
MCV: 89.5 fL (ref 80.0–100.0)
Monocytes Absolute: 0.3 10*3/uL (ref 0.1–1.0)
Monocytes Relative: 2 %
Neutro Abs: 11.8 10*3/uL — ABNORMAL HIGH (ref 1.7–7.7)
Neutrophils Relative %: 95 %
Platelets: 200 10*3/uL (ref 150–400)
RBC: 5.7 MIL/uL (ref 4.22–5.81)
RDW: 11.9 % (ref 11.5–15.5)
WBC: 12.4 10*3/uL — ABNORMAL HIGH (ref 4.0–10.5)
nRBC: 0 % (ref 0.0–0.2)

## 2020-01-12 LAB — COMPREHENSIVE METABOLIC PANEL
ALT: 63 U/L — ABNORMAL HIGH (ref 0–44)
AST: 20 U/L (ref 15–41)
Albumin: 2.8 g/dL — ABNORMAL LOW (ref 3.5–5.0)
Alkaline Phosphatase: 63 U/L (ref 38–126)
Anion gap: 13 (ref 5–15)
BUN: 42 mg/dL — ABNORMAL HIGH (ref 8–23)
CO2: 27 mmol/L (ref 22–32)
Calcium: 9 mg/dL (ref 8.9–10.3)
Chloride: 98 mmol/L (ref 98–111)
Creatinine, Ser: 1.09 mg/dL (ref 0.61–1.24)
GFR calc Af Amer: >60 mL/min (ref >60)
GFR calc non Af Amer: >60 mL/min (ref >60)
Glucose, Bld: 192 mg/dL — ABNORMAL HIGH (ref 70–99)
Potassium: 4.2 mmol/L (ref 3.5–5.1)
Sodium: 138 mmol/L (ref 135–145)
Total Bilirubin: 1.6 mg/dL — ABNORMAL HIGH (ref 0.3–1.2)
Total Protein: 6.3 g/dL — ABNORMAL LOW (ref 6.5–8.1)

## 2020-01-12 LAB — C-REACTIVE PROTEIN: CRP: 4.1 mg/dL — ABNORMAL HIGH (ref <1.0)

## 2020-01-12 LAB — FERRITIN: Ferritin: 322 ng/mL (ref 24–336)

## 2020-01-12 LAB — D-DIMER, QUANTITATIVE: D-Dimer, Quant: 1.04 ug/mL-FEU — ABNORMAL HIGH (ref 0.00–0.50)

## 2020-01-12 NOTE — Progress Notes (Signed)
RN will ask MD order continuous pulse ox order.

## 2020-01-12 NOTE — Progress Notes (Signed)
Physical Therapy Treatment Patient Details Name: Connor [name] MRN: [id number] DOB: Mar 13, 1946 Today's Date: 01/12/2020    History of Present Illness 74 y.o. male with medical history significant of Polycythemia Vera, BPH, mild COPD, carotid artery disease was diagnosed as COVID positive on 12/27/19. He was scheduled for and did receive monoclonal antibody infusion 12/24/2019. After treatment his O2 sat was 85%. He was referred to [facility] on 9/22 for evaluation and admission. He is unvaccinated. CXR with mild multifocal ground glass infiltrates.    PT Comments    Pt agreeable to OOB, feels better today overall per his report; remains on 15L HF +  NRB at 15L. SpO2 = 85% with transfer, recovers to 88% within ,1 minute. 3/4 DOE, HR max 134 with standing; reviewed diaphragmatic breathing, flutter, IS use. Reviewed light  LE exercises, pt able to return demo--reps limited by PT  d/t incr HR; will continue to follow   Follow Up Recommendations  Home health PT;Supervision for mobility/OOB     Equipment Recommendations  Other (comment) (TBD)    Recommendations for Other Services       Precautions / Restrictions Precautions Precautions: Fall Precaution Comments: monitor VS    Mobility  Bed Mobility Overal bed mobility: Needs Assistance Bed Mobility: Supine to Sit     Supine to sit: Supervision     General bed mobility comments: for safety, increased time especially to scoot to EOB with use of bedrails to come to sitting.. bed soaked in urine, condom cath caem off, RN made aware   Transfers Overall transfer level: Needs assistance Equipment used: None Transfers: Sit to/from Stand Sit to Stand: Min guard;Min assist Stand pivot transfers: Min assist       General transfer comment: light assist to rise and stabilize, initial dizziness in sitting, resolved within ~30seconds   Ambulation/Gait             General Gait Details: deferred d/t incr HR    Stairs              Wheelchair Mobility    Modified Rankin (Stroke Patients Only)       Balance   Sitting-balance support: No upper extremity supported;Feet supported Sitting balance-Leahy Scale: Fair     Standing balance support: No upper extremity supported Standing balance-Leahy Scale: Fair Standing balance comment: able to maintain static stand with close sueprvision for safety                             Cognition Arousal/Alertness: Awake/alert Behavior During Therapy: WFL for tasks assessed/performed Overall Cognitive Status: Within Functional Limits for tasks assessed                                        Exercises      General Comments        Pertinent Vitals/Pain Pain Assessment: No/denies pain    Home Living                      Prior Function            PT Goals (current goals can now be found in the care plan section) Acute Rehab PT Goals Patient Stated Goal: breathe better, go home PT Goal Formulation: With patient Time For Goal Achievement: 01/19/20 Potential to Achieve Goals: Good Progress towards PT goals: Progressing toward goals  Frequency    Min 3X/week      PT Plan Current plan remains appropriate    Co-evaluation              AM-PAC PT "6 Clicks" Mobility   Outcome Measure  Help needed turning from your back to your side while in a flat bed without using bedrails?: None Help needed moving from lying on your back to sitting on the side of a flat bed without using bedrails?: None Help needed moving to and from a bed to a chair (including a wheelchair)?: A Little Help needed standing up from a chair using your arms (e.g., wheelchair or bedside chair)?: A Little Help needed to walk in hospital room?: A Lot Help needed climbing 3-5 steps with a railing? : A Lot 6 Click Score: 18    End of Session Equipment Utilized During Treatment: Oxygen Activity Tolerance: Patient limited by fatigue;Treatment  limited secondary to medical complications (Comment) (elevated HR) Patient left: in chair;with call bell/phone within reach;with chair alarm set;with nursing/sitter in room Nurse Communication: Mobility status PT Visit Diagnosis: Unsteadiness on feet (R26.81);Difficulty in walking, not elsewhere classified (R26.2)     Time: 1947-1252 PT Time Calculation (min) (ACUTE ONLY): 19 min  Charges:  $Therapeutic Activity: 8-22 mins                     Baxter Flattery, PT  Acute Rehab Dept ([place]) [phone] Pager [phone]  01/12/2020    [facility] 01/12/2020, 3:42 PM

## 2020-01-12 NOTE — Progress Notes (Signed)
PROGRESS NOTE    Connor [name]  POE:[id number] DOB: 14-Oct-1945 DOA: 01/04/2020 PCP: Dettinger, Fransisca Kaufmann, MD    Brief Narrative:  74 year old male with polycythemia vera, BPH, mild COPD, OSA on CPAP at home , coronary artery disease diagnosed with Covid on 9/16. Initially mild symptoms. Went to receive monoclonal antibody infusion on 9/22. After treatment oxygen was 85% so referred to ER. Unvaccinated against COVID-19. Chest x-ray with bilateral multifocal groundglass infiltrates. Remains in the hospital with very high oxygen requirement despite maximum medical therapy.  Assessment & Plan:   Principal Problem:   Pneumonia due to COVID-19 virus Active Problems:   Essential hypertension, benign   OSA (obstructive sleep apnea)   BPH (benign prostatic hyperplasia)   Hyperlipidemia LDL goal <100  Acute hypoxemic respiratory failure secondary to COVID-19 virus: Continue to monitor due to significant symptoms , patient is still on significant oxygen requirement.  Along with BiPAP requirement at night. chest physiotherapy, incentive spirometry, deep breathing exercises, sputum induction, mucolytic's and bronchodilators. Supplemental oxygen to keep saturations more than 82%. Covid directed therapy with , steroids, on high-dose Solu-Medrol remdesivir, completed 5 days of therapy actemra /on baricitinib since 9/24 Due to severity of symptoms, patient will need daily inflammatory markers, chest x-rays, liver function test to monitor and direct COVID-19 therapies. CTA chest was negative for PE. Lower extremity duplex is positive for right lower extremity DVT. On Xarelto. Continue IV diuresis trial. Echocardiogram with normal ejection fraction.  COVID-19 Labs  Recent Labs    01/10/20 0402 01/11/20 0458 01/12/20 0419  DDIMER 1.42* 1.08* 1.04*  FERRITIN 441* 317 322  CRP 4.3* 1.5* 4.1*    No results found for: SARSCOV2NAA   SpO2: (!) 88 % O2 Flow Rate (L/min): 15 L/min (plus 15  LPM NRB) FiO2 (%): 100 %   Essential hypertension: Blood pressure fairly stable. Cozaar on hold.  Hyperlipidemia: On Crestor. Continued.  Sleep apnea: Using BiPAP at night with high flow oxygen.   DVT prophylaxis: Place and maintain sequential compression device Start: 01/07/20 1315 Rivaroxaban (XARELTO) tablet 15 mg  rivaroxaban (XARELTO) tablet 20 mg   Code Status: Full code Family Communication: Patient's wife on the phone, called and updated.  Updated about very critical condition. Disposition Plan: Status is: Inpatient  Remains inpatient appropriate because:Inpatient level of care appropriate due to severity of illness   Dispo: The patient is from: Home              Anticipated d/c is to: Home              Anticipated d/c date is: > 3 days              Patient currently is not medically stable to d/c.         Consultants:   None  Procedures:   None  Antimicrobials:  Antibiotics Given (last 72 hours)    None       Finished remdesivir therapy  Subjective: Patient seen and examined.  Wore BiPAP all night and had some restful night.  In the daytime he is wearing 15 L nasal cannula oxygen as well as nonrebreather on top of it.  He himself is poor historian but he says he is okay. Objective: Vitals:   01/12/20 0700 01/12/20 0908 01/12/20 0918 01/12/20 1221  BP:  (!) 147/79  138/71  Pulse:  (!) 108 100 (!) 120  Resp:  18 18 20   Temp:  97.6 F (36.4 C)  98.2 F (36.8 C)  TempSrc:  Oral  Oral  SpO2: 92% (!) 88% (!) 85% (!) 88%  Weight:      Height:        Intake/Output Summary (Last 24 hours) at 01/12/2020 1351 Last data filed at 01/12/2020 1228 Gross per 24 hour  Intake 480 ml  Output 2850 ml  Net -2370 ml   Filed Weights   12/18/2019 1626  Weight: 102.1 kg    Examination:  General exam: Sick looking, able to communicate in short sentences.  On 15 L oxygen cannula as well on nonrebreather. Respiratory system: Poor breathing effort with poor  bilateral air entry. Cardiovascular system: S1 & S2 heard, RRR. No JVD, murmurs, rubs, gallops or clicks. No pedal edema. Gastrointestinal system: Abdomen is nondistended, soft and nontender. No organomegaly or masses felt. Normal bowel sounds heard. Central nervous system: Alert and oriented. No focal neurological deficits. Extremities: Symmetric 5 x 5 power. Skin: No rashes, lesions or ulcers Psychiatry: Judgement and insight appear normal. Mood & affect anxious.    Data Reviewed: I have personally reviewed following labs and imaging studies  CBC: Recent Labs  Lab 01/08/20 0402 01/09/20 0355 01/10/20 0402 01/11/20 0458 01/12/20 0419  WBC 9.0 8.9 12.6* 13.1* 12.4*  NEUTROABS 8.4* 8.4* 11.8* 12.2* 11.8*  HGB 15.5 16.3 15.4 16.7 16.9  HCT 46.8 49.6 46.0 49.7 51.0  MCV 90.9 90.3 89.0 88.9 89.5  PLT 102* 138* 167 209 229   Basic Metabolic Panel: Recent Labs  Lab 01/08/20 0402 01/09/20 0355 01/10/20 0402 01/11/20 0458 01/12/20 0419  NA 138 139 135 137 138  K 3.5 4.3 4.4 4.4 4.2  CL 104 103 102 100 98  CO2 24 25 23 24 27   GLUCOSE 145* 198* 191* 185* 192*  BUN 25* 26* 30* 33* 42*  CREATININE 0.86 0.82 0.85 0.97 1.09  CALCIUM 8.2* 8.6* 8.4* 8.5* 9.0   GFR: Estimated Creatinine Clearance: 70.1 mL/min (by C-G formula based on SCr of 1.09 mg/dL). Liver Function Tests: Recent Labs  Lab 01/08/20 0402 01/09/20 0355 01/10/20 0402 01/11/20 0458 01/12/20 0419  AST 32 28 45* 31 20  ALT 34 36 63* 74* 63*  ALKPHOS 61 69 59 62 63  BILITOT 1.5* 1.2 1.2 1.4* 1.6*  PROT 5.6* 6.1* 5.6* 5.9* 6.3*  ALBUMIN 3.0* 3.0* 2.6* 2.9* 2.8*   No results for input(s): LIPASE, AMYLASE in the last 168 hours. No results for input(s): AMMONIA in the last 168 hours. Coagulation Profile: No results for input(s): INR, PROTIME in the last 168 hours. Cardiac Enzymes: No results for input(s): CKTOTAL, CKMB, CKMBINDEX, TROPONINI in the last 168 hours. BNP (last 3 results) No results for input(s):  PROBNP in the last 8760 hours. HbA1C: No results for input(s): HGBA1C in the last 72 hours. CBG: Recent Labs  Lab 01/11/20 0909 01/11/20 1134 01/11/20 1643 01/12/20 0753 01/12/20 1216  GLUCAP 155* 275* 244* 170* 236*   Lipid Profile: No results for input(s): CHOL, HDL, LDLCALC, TRIG, CHOLHDL, LDLDIRECT in the last 72 hours. Thyroid Function Tests: No results for input(s): TSH, T4TOTAL, FREET4, T3FREE, THYROIDAB in the last 72 hours. Anemia Panel: Recent Labs    01/11/20 0458 01/12/20 0419  FERRITIN 317 322   Sepsis Labs: No results for input(s): PROCALCITON, LATICACIDVEN in the last 168 hours.  Recent Results (from the past 240 hour(s))  Blood Culture (routine x 2)     Status: None   Collection Time: 01/06/2020  6:00 PM   Specimen: Site Not Specified; Blood  Result Value Ref  Range Status   Specimen Description   Final    SITE NOT SPECIFIED Performed at [facility] [address] [place], [place] [address]    Special Requests   Final    BOTTLES DRAWN AEROBIC AND ANAEROBIC Blood Culture results may not be optimal due to an inadequate volume of blood received in culture bottles Performed at [facility] [address] [place], [place] [address]    Culture   Final    NO GROWTH 5 DAYS Performed at [facility], [place] [address] [place], [place] [address]    Report Status 01/07/2020 FINAL  Final         Radiology Studies: ECHOCARDIOGRAM LIMITED  Result Date: 01/11/2020    ECHOCARDIOGRAM LIMITED REPORT   Patient Name:   MILLION MAHARAJ Date of Exam: 01/11/2020 Medical Rec #:  [id number]       Height:       71.0 in Accession #:    [id number]      Weight:       225.0 lb Date of Birth:  [birth year]       BSA:          2.217 m Patient Age:    56 years        BP:           141/74 mmHg Patient Gender: M               HR:           97 bpm. Exam Location:  Inpatient Procedure: Limited Echo and Color Doppler Indications:    Acute  Respiratory Insufficiency R06.89  History:        Patient has prior history of Echocardiogram examinations, most                 recent 02/11/2014. COPD; Risk Factors:Hypertension, Dyslipidemia,                 Sleep Apnea and COVID+ 12/27/19.  Sonographer:    Raquel Sarna Senior RDCS Referring Phys: [id number] [place]  1. Limited study for LV function; full doppler not performed; vigorous LV systolic function.  2. Left ventricular ejection fraction, by estimation, is 70 to 75%. The left ventricle has hyperdynamic function. There is mild left ventricular hypertrophy of the basal-septal segment.  3. Right ventricular systolic function is normal. The right ventricular size is normal.  4. The mitral valve is normal in structure. No evidence of mitral valve regurgitation.  5. The aortic valve is tricuspid. Aortic valve regurgitation is not visualized.  6. The inferior vena cava is normal in size with greater than 50% respiratory variability, suggesting right atrial pressure of 3 mmHg. FINDINGS  Left Ventricle: Left ventricular ejection fraction, by estimation, is 70 to 75%. The left ventricle has hyperdynamic function. The left ventricular internal cavity size was normal in size. There is mild left ventricular hypertrophy of the basal-septal segment. Right Ventricle: The right ventricular size is normal. Right ventricular systolic function is normal. Left Atrium: Left atrial size was normal in size. Right Atrium: Right atrial size was normal in size. Pericardium: There is no evidence of pericardial effusion. Mitral Valve: The mitral valve is normal in structure. Tricuspid Valve: The tricuspid valve is normal in structure. Aortic Valve: The aortic valve is tricuspid. Aortic valve regurgitation is not visualized. Pulmonic Valve: The pulmonic valve was normal in structure. Venous: The inferior vena cava is normal in size with greater than 50%  respiratory variability, suggesting right atrial pressure of 3 mmHg.  Additional Comments: Limited study for LV function; full doppler not performed; vigorous LV systolic function. RIGHT VENTRICLE RV S prime:     24.10 cm/s TAPSE (M-mode): 2.5 cm Kirk Ruths MD Electronically signed by Kirk Ruths MD Signature Date/Time: 01/11/2020/11:29:23 AM    Final         Scheduled Meds: . alfuzosin  10 mg Oral q morning - 10a  . vitamin C  500 mg Oral Daily  . baricitinib  4 mg Oral Daily  . ferrous sulfate  325 mg Oral Q breakfast  . furosemide  40 mg Intravenous Q12H  . insulin aspart  0-9 Units Subcutaneous TID WC  . loratadine  10 mg Oral Daily  . methylPREDNISolone (SOLU-MEDROL) injection  50 mg Intravenous Q12H  . montelukast  10 mg Oral QHS  . Rivaroxaban  15 mg Oral BID WC   Followed by  . [START ON 01/28/2020] rivaroxaban  20 mg Oral Q supper  . rosuvastatin  10 mg Oral Daily  . senna  1 tablet Oral BID  . vitamin B-12  500 mcg Oral Daily  . zinc sulfate  220 mg Oral Daily   Continuous Infusions: . sodium chloride       LOS: 10 days    Time spent: 35 minutes    Barb Merino, MD [facility] Pager [phone]

## 2020-01-13 LAB — CBC WITH DIFFERENTIAL/PLATELET
Abs Immature Granulocytes: 0.28 10*3/uL — ABNORMAL HIGH (ref 0.00–0.07)
Basophils Absolute: 0 10*3/uL (ref 0.0–0.1)
Basophils Relative: 0 %
Eosinophils Absolute: 0 10*3/uL (ref 0.0–0.5)
Eosinophils Relative: 0 %
HCT: 51.4 % (ref 39.0–52.0)
Hemoglobin: 17.2 g/dL — ABNORMAL HIGH (ref 13.0–17.0)
Immature Granulocytes: 2 %
Lymphocytes Relative: 2 %
Lymphs Abs: 0.2 10*3/uL — ABNORMAL LOW (ref 0.7–4.0)
MCH: 29.6 pg (ref 26.0–34.0)
MCHC: 33.5 g/dL (ref 30.0–36.0)
MCV: 88.5 fL (ref 80.0–100.0)
Monocytes Absolute: 0.3 10*3/uL (ref 0.1–1.0)
Monocytes Relative: 2 %
Neutro Abs: 12.5 10*3/uL — ABNORMAL HIGH (ref 1.7–7.7)
Neutrophils Relative %: 94 %
Platelets: 202 10*3/uL (ref 150–400)
RBC: 5.81 MIL/uL (ref 4.22–5.81)
RDW: 11.9 % (ref 11.5–15.5)
WBC: 13.3 10*3/uL — ABNORMAL HIGH (ref 4.0–10.5)
nRBC: 0 % (ref 0.0–0.2)

## 2020-01-13 LAB — COMPREHENSIVE METABOLIC PANEL
ALT: 56 U/L — ABNORMAL HIGH (ref 0–44)
AST: 23 U/L (ref 15–41)
Albumin: 3 g/dL — ABNORMAL LOW (ref 3.5–5.0)
Alkaline Phosphatase: 65 U/L (ref 38–126)
Anion gap: 15 (ref 5–15)
BUN: 54 mg/dL — ABNORMAL HIGH (ref 8–23)
CO2: 27 mmol/L (ref 22–32)
Calcium: 9.2 mg/dL (ref 8.9–10.3)
Chloride: 96 mmol/L — ABNORMAL LOW (ref 98–111)
Creatinine, Ser: 0.99 mg/dL (ref 0.61–1.24)
GFR calc Af Amer: >60 mL/min (ref >60)
GFR calc non Af Amer: >60 mL/min (ref >60)
Glucose, Bld: 191 mg/dL — ABNORMAL HIGH (ref 70–99)
Potassium: 4.2 mmol/L (ref 3.5–5.1)
Sodium: 138 mmol/L (ref 135–145)
Total Bilirubin: 1.4 mg/dL — ABNORMAL HIGH (ref 0.3–1.2)
Total Protein: 6.5 g/dL (ref 6.5–8.1)

## 2020-01-13 LAB — GLUCOSE, CAPILLARY
Glucose-Capillary: 196 mg/dL — ABNORMAL HIGH (ref 70–99)
Glucose-Capillary: 328 mg/dL — ABNORMAL HIGH (ref 70–99)
Glucose-Capillary: 239 mg/dL — ABNORMAL HIGH (ref 70–99)
Glucose-Capillary: 181 mg/dL — ABNORMAL HIGH (ref 70–99)

## 2020-01-13 LAB — C-REACTIVE PROTEIN: CRP: 5 mg/dL — ABNORMAL HIGH (ref <1.0)

## 2020-01-13 LAB — FERRITIN: Ferritin: 362 ng/mL — ABNORMAL HIGH (ref 24–336)

## 2020-01-13 LAB — D-DIMER, QUANTITATIVE: D-Dimer, Quant: 1.34 ug/mL-FEU — ABNORMAL HIGH (ref 0.00–0.50)

## 2020-01-13 MED ORDER — INSULIN DETEMIR 100 UNIT/ML ~~LOC~~ SOLN
10.0000 [IU] | Freq: Two times a day (BID) | SUBCUTANEOUS | Status: DC
  Administered 2020-01-13 – 2020-01-19 (×11): 10 [IU] via SUBCUTANEOUS
  Filled 2020-01-13 (×14): qty 0.1

## 2020-01-13 NOTE — Progress Notes (Signed)
Physical Therapy Treatment Patient Details Name: Connor [name] MRN: [id number] DOB: 1945-04-22 Today's Date: 01/13/2020    History of Present Illness 74 y.o. male with medical history significant of Polycythemia Vera, BPH, mild COPD, carotid artery disease was diagnosed as COVID positive on 12/27/19. He was scheduled for and did receive monoclonal antibody infusion 12/12/2019. After treatment his O2 sat was 85%. He was referred to [facility] on 9/22 for evaluation and admission. He is unvaccinated. CXR with mild multifocal ground glass infiltrates.    PT Comments    Pt agreeable to PT, states he isn't feeling that great today but would like to be OOB. Transitioned to EOB,SpO2=86%, seated rest d/t mild dizziness and incr HR.  Performed sit>stand, marching in place, stand pivot to chair for seated rest, SpO2=76%. Recovered to 84% after 3 minutes, then amb ~16' in room with RW and min assist. Reviewed diaphragmatic and pursed lip breathing, pt able to return demo. VS: HR 106-122 max SpO2=88% (rest)-->76% (activity)--> 85% after 2 minutes (after amb)--88% on PT departure  15L HFNC + 15L NRB throughout session incr tolerance to activity with improved O2 saturations/decr HR and decr recovery time    Follow Up Recommendations  Home health PT;Supervision for mobility/OOB     Equipment Recommendations  Other (comment) (TBD)    Recommendations for Other Services       Precautions / Restrictions Precautions Precautions: Fall Precaution Comments: monitor VS Restrictions Weight Bearing Restrictions: No    Mobility  Bed Mobility Overal bed mobility: Needs Assistance Bed Mobility: Supine to Sit     Supine to sit: Supervision     General bed mobility comments: for safety, increased time especially to scoot to EOB with use of bedrails to come to sitting.. bed soaked in urine, condom cath caem off, RN made aware   Transfers Overall transfer level: Needs assistance Equipment used: 2 person  hand held assist Transfers: Sit to/from Stand Sit to Stand: Min assist Stand pivot transfers: Min assist       General transfer comment: light assist to rise and stabilize, initial dizziness in sitting/standing, resolved within ~30seconds   Ambulation/Gait Ambulation/Gait assistance: Min assist Gait Distance (Feet): 16 Feet Assistive device: Rolling walker (2 wheeled) Gait Pattern/deviations: Step-through pattern;Decreased stride length     General Gait Details: min assist for balance and to maneuver RW at times, incr time, slow but grossly steady gait. on 15L HFNC and NRB  throughout amb    Stairs             Wheelchair Mobility    Modified Rankin (Stroke Patients Only)       Balance   Sitting-balance support: No upper extremity supported;Feet supported Sitting balance-Leahy Scale: Fair     Standing balance support: No upper extremity supported Standing balance-Leahy Scale: Poor Standing balance comment: reliant on UEs                             Cognition Arousal/Alertness: Awake/alert Behavior During Therapy: WFL for tasks assessed/performed;Flat affect Overall Cognitive Status: Within Functional Limits for tasks assessed                                        Exercises General Exercises - Lower Extremity Ankle Circles/Pumps: AROM;Both;5 reps Long Arc Quad: AROM;Both;5 reps;Seated    General Comments        Pertinent  Vitals/Pain Pain Assessment: No/denies pain    Home Living                      Prior Function            PT Goals (current goals can now be found in the care plan section) Acute Rehab PT Goals Patient Stated Goal: breathe better, go home PT Goal Formulation: With patient Time For Goal Achievement: 01/19/20 Potential to Achieve Goals: Good Progress towards PT goals: Progressing toward goals    Frequency    Min 3X/week      PT Plan Current plan remains appropriate     Co-evaluation              AM-PAC PT "6 Clicks" Mobility   Outcome Measure  Help needed turning from your back to your side while in a flat bed without using bedrails?: None Help needed moving from lying on your back to sitting on the side of a flat bed without using bedrails?: None Help needed moving to and from a bed to a chair (including a wheelchair)?: A Little Help needed standing up from a chair using your arms (e.g., wheelchair or bedside chair)?: A Little Help needed to walk in hospital room?: A Lot Help needed climbing 3-5 steps with a railing? : A Lot 6 Click Score: 18    End of Session Equipment Utilized During Treatment: Oxygen Activity Tolerance: Patient tolerated treatment well (elevated HR) Patient left: in chair;with call bell/phone within reach;with chair alarm set Nurse Communication: Mobility status PT Visit Diagnosis: Unsteadiness on feet (R26.81);Difficulty in walking, not elsewhere classified (R26.2)     Time: 0076-2263 PT Time Calculation (min) (ACUTE ONLY): 40 min  Charges:  $Gait Training: 8-22 mins $Therapeutic Activity: 23-37 mins                     Baxter Flattery, PT  Acute Rehab Dept ([place]) [phone] Pager [phone]  01/13/2020    [facility] 01/13/2020, 1:06 PM

## 2020-01-13 NOTE — Progress Notes (Signed)
PROGRESS NOTE    Connor [name]  CLE:[id number] DOB: [birth year] DOA: 12/29/2019 PCP: Dettinger, Fransisca Kaufmann, MD    Brief Narrative:  74 year old male with polycythemia vera, BPH, mild COPD, OSA on CPAP at home , coronary artery disease diagnosed with Covid on 9/16. Initially mild symptoms. Went to receive monoclonal antibody infusion on 9/22. After treatment oxygen was 85% so referred to ER. Unvaccinated against COVID-19. Chest x-ray with bilateral multifocal groundglass infiltrates. Remains in the hospital with very high oxygen requirement despite maximum medical therapy.  Assessment & Plan:   Principal Problem:   Pneumonia due to COVID-19 virus Active Problems:   Essential hypertension, benign   OSA (obstructive sleep apnea)   BPH (benign prostatic hyperplasia)   Hyperlipidemia LDL goal <100  Acute hypoxemic respiratory failure secondary to COVID-19 virus: Continue to monitor due to significant symptoms , patient is still on significant oxygen requirement.  Along with BiPAP requirement at night. chest physiotherapy, incentive spirometry, deep breathing exercises, sputum induction, mucolytic's and bronchodilators. Supplemental oxygen to keep saturations more than 82%. Covid directed therapy with , steroids, on high-dose Solu-Medrol remdesivir, completed 5 days of therapy actemra /on baricitinib since 9/24, will complete 14 days of therapy. Due to severity of symptoms, patient will need daily inflammatory markers, chest x-rays, liver function test to monitor and direct COVID-19 therapies. CTA chest was negative for PE. Lower extremity duplex is positive for right lower extremity DVT. On Xarelto. Continue IV diuresis trial. Echocardiogram with normal ejection fraction.  COVID-19 Labs  Recent Labs    01/11/20 0458 01/12/20 0419 01/13/20 0341  DDIMER 1.08* 1.04* 1.34*  FERRITIN 317 322 362*  CRP 1.5* 4.1* 5.0*    No results found for: SARSCOV2NAA   SpO2: (!) 89 % O2  Flow Rate (L/min): 15 L/min FiO2 (%): 100 %   Essential hypertension: Blood pressure fairly stable. Cozaar on hold.  Hyperlipidemia: On Crestor. Continued.  Sleep apnea: Using BiPAP at night with high flow oxygen.   DVT prophylaxis: Place and maintain sequential compression device Start: 01/07/20 1315 Rivaroxaban (XARELTO) tablet 15 mg  rivaroxaban (XARELTO) tablet 20 mg   Code Status: Full code Family Communication: Patient's wife present on FaceTime during patient encounter.  Updated. Disposition Plan: Status is: Inpatient  Remains inpatient appropriate because:Inpatient level of care appropriate due to severity of illness   Dispo: The patient is from: Home              Anticipated d/c is to: Home with home health care              Anticipated d/c date is: > 3 days              Patient currently is not medically stable to d/c.         Consultants:   None  Procedures:   None  Antimicrobials:  Antibiotics Given (last 72 hours)    None       Finished remdesivir therapy  Subjective: Patient seen and examined.  He has some right flank pain that has improved now with bowel movement.  Remains on multiple sources of oxygen with nonrebreather as well as nasal cannula oxygen of 15 L.  Wearing BiPAP at night.  Mobility tolerance is poor. Telemetry shows sinus tachycardia, heart rate less than 130.  Objective: Vitals:   01/13/20 0304 01/13/20 0629 01/13/20 0800 01/13/20 0919  BP:  124/72    Pulse:  (!) 102    Resp: (!) 25 (!) 24  Temp:  97.8 F (36.6 C)    TempSrc:      SpO2:  90% (!) 88% (!) 89%  Weight:      Height:        Intake/Output Summary (Last 24 hours) at 01/13/2020 1327 Last data filed at 01/13/2020 1100 Gross per 24 hour  Intake 800 ml  Output 1950 ml  Net -1150 ml   Filed Weights   12/14/2019 1626  Weight: 102.1 kg    Examination:  General exam: Sick looking, able to communicate in short sentences.  On 15 L oxygen cannula as well on  nonrebreather. Respiratory system: Poor bilateral air entry.  Poor inspiratory forward. Cardiovascular system: S1 & S2 heard, RRR. No JVD, murmurs, rubs, gallops or clicks. No pedal edema. Gastrointestinal system: Abdomen is nondistended, soft and nontender. No organomegaly or masses felt. Normal bowel sounds heard. Central nervous system: Alert and oriented. No focal neurological deficits. Extremities: Symmetric 5 x 5 power. Skin: No rashes, lesions or ulcers Psychiatry: Judgement and insight appear normal. Mood & affect anxious.    Data Reviewed: I have personally reviewed following labs and imaging studies  CBC: Recent Labs  Lab 01/09/20 0355 01/10/20 0402 01/11/20 0458 01/12/20 0419 01/13/20 0341  WBC 8.9 12.6* 13.1* 12.4* 13.3*  NEUTROABS 8.4* 11.8* 12.2* 11.8* 12.5*  HGB 16.3 15.4 16.7 16.9 17.2*  HCT 49.6 46.0 49.7 51.0 51.4  MCV 90.3 89.0 88.9 89.5 88.5  PLT 138* 167 209 200 546   Basic Metabolic Panel: Recent Labs  Lab 01/09/20 0355 01/10/20 0402 01/11/20 0458 01/12/20 0419 01/13/20 0341  NA 139 135 137 138 138  K 4.3 4.4 4.4 4.2 4.2  CL 103 102 100 98 96*  CO2 25 23 24 27 27   GLUCOSE 198* 191* 185* 192* 191*  BUN 26* 30* 33* 42* 54*  CREATININE 0.82 0.85 0.97 1.09 0.99  CALCIUM 8.6* 8.4* 8.5* 9.0 9.2   GFR: Estimated Creatinine Clearance: 77.2 mL/min (by C-G formula based on SCr of 0.99 mg/dL). Liver Function Tests: Recent Labs  Lab 01/09/20 0355 01/10/20 0402 01/11/20 0458 01/12/20 0419 01/13/20 0341  AST 28 45* 31 20 23   ALT 36 63* 74* 63* 56*  ALKPHOS 69 59 62 63 65  BILITOT 1.2 1.2 1.4* 1.6* 1.4*  PROT 6.1* 5.6* 5.9* 6.3* 6.5  ALBUMIN 3.0* 2.6* 2.9* 2.8* 3.0*   No results for input(s): LIPASE, AMYLASE in the last 168 hours. No results for input(s): AMMONIA in the last 168 hours. Coagulation Profile: No results for input(s): INR, PROTIME in the last 168 hours. Cardiac Enzymes: No results for input(s): CKTOTAL, CKMB, CKMBINDEX, TROPONINI  in the last 168 hours. BNP (last 3 results) No results for input(s): PROBNP in the last 8760 hours. HbA1C: No results for input(s): HGBA1C in the last 72 hours. CBG: Recent Labs  Lab 01/12/20 0753 01/12/20 1216 01/12/20 1734 01/13/20 0809 01/13/20 1159  GLUCAP 170* 236* 315* 196* 328*   Lipid Profile: No results for input(s): CHOL, HDL, LDLCALC, TRIG, CHOLHDL, LDLDIRECT in the last 72 hours. Thyroid Function Tests: No results for input(s): TSH, T4TOTAL, FREET4, T3FREE, THYROIDAB in the last 72 hours. Anemia Panel: Recent Labs    01/12/20 0419 01/13/20 0341  FERRITIN 322 362*   Sepsis Labs: No results for input(s): PROCALCITON, LATICACIDVEN in the last 168 hours.  No results found for this or any previous visit (from the past 240 hour(s)).       Radiology Studies: No results found.  Scheduled Meds: . alfuzosin  10 mg Oral q morning - 10a  . vitamin C  500 mg Oral Daily  . baricitinib  4 mg Oral Daily  . ferrous sulfate  325 mg Oral Q breakfast  . furosemide  40 mg Intravenous Q12H  . insulin aspart  0-9 Units Subcutaneous TID WC  . loratadine  10 mg Oral Daily  . methylPREDNISolone (SOLU-MEDROL) injection  50 mg Intravenous Q12H  . montelukast  10 mg Oral QHS  . Rivaroxaban  15 mg Oral BID WC   Followed by  . [START ON 01/28/2020] rivaroxaban  20 mg Oral Q supper  . rosuvastatin  10 mg Oral Daily  . senna  1 tablet Oral BID  . vitamin B-12  500 mcg Oral Daily  . zinc sulfate  220 mg Oral Daily   Continuous Infusions: . sodium chloride       LOS: 11 days    Time spent: 35 minutes    Barb Merino, MD [facility] Pager [phone]

## 2020-01-14 LAB — C-REACTIVE PROTEIN: CRP: 2.3 mg/dL — ABNORMAL HIGH (ref <1.0)

## 2020-01-14 LAB — GLUCOSE, CAPILLARY
Glucose-Capillary: 182 mg/dL — ABNORMAL HIGH (ref 70–99)
Glucose-Capillary: 282 mg/dL — ABNORMAL HIGH (ref 70–99)
Glucose-Capillary: 178 mg/dL — ABNORMAL HIGH (ref 70–99)

## 2020-01-14 LAB — FERRITIN: Ferritin: 424 ng/mL — ABNORMAL HIGH (ref 24–336)

## 2020-01-14 LAB — D-DIMER, QUANTITATIVE: D-Dimer, Quant: 0.83 ug/mL-FEU — ABNORMAL HIGH (ref 0.00–0.50)

## 2020-01-14 MED ORDER — INSULIN ASPART 100 UNIT/ML ~~LOC~~ SOLN
3.0000 [IU] | Freq: Three times a day (TID) | SUBCUTANEOUS | Status: DC
  Administered 2020-01-14 – 2020-01-15 (×3): 3 [IU] via SUBCUTANEOUS

## 2020-01-14 NOTE — Progress Notes (Signed)
PROGRESS NOTE    Connor [name]  BZJ:[id number] DOB: 01-20-1946 DOA: 12/21/2019 PCP: Dettinger, Fransisca Kaufmann, MD    Brief Narrative:  74 year old male with polycythemia vera, BPH, mild COPD, OSA on CPAP at home , coronary artery disease diagnosed with Covid on 9/16. Initially mild symptoms. Went to receive monoclonal antibody infusion on 9/22. After treatment oxygen was 85% so referred to ER. Unvaccinated against COVID-19. Chest x-ray with bilateral multifocal groundglass infiltrates. Remains in the hospital with very high oxygen requirement despite maximum medical therapy.  Assessment & Plan:   Principal Problem:   Pneumonia due to COVID-19 virus Active Problems:   Essential hypertension, benign   OSA (obstructive sleep apnea)   BPH (benign prostatic hyperplasia)   Hyperlipidemia LDL goal <100  Acute hypoxemic respiratory failure secondary to COVID-19 virus: Still on significant oxygen and using BiPAP at night.  Remains on high flow oxygen, however stable for last few days. chest physiotherapy, incentive spirometry, deep breathing exercises, sputum induction, mucolytic's and bronchodilators. Supplemental oxygen to keep saturations more than 82%. Covid directed therapy with , steroids, on high-dose Solu-Medrol, continue until clinical improvement. remdesivir, completed 5 days of therapy actemra /on baricitinib since 9/24, will complete 14 days of therapy. Due to severity of symptoms, patient will need daily inflammatory markers, chest x-rays, liver function test to monitor and direct COVID-19 therapies. CTA chest was negative for PE. Lower extremity duplex is positive for right lower extremity DVT. On Xarelto. Continue IV diuresis trial. Echocardiogram with normal ejection fraction.  COVID-19 Labs  Recent Labs    01/12/20 0419 01/13/20 0341 01/14/20 0453  DDIMER 1.04* 1.34* 0.83*  FERRITIN 322 362* 424*  CRP 4.1* 5.0* 2.3*    No results found for: SARSCOV2NAA   SpO2:  (!) 86 % O2 Flow Rate (L/min): 15 L/min (plus 15L NRB) FiO2 (%): 100 %   Essential hypertension: Blood pressure fairly stable. Cozaar on hold.  Hyperlipidemia: On Crestor. Continued.  Sleep apnea: Using BiPAP at night with high flow oxygen.  Tolerating well.   DVT prophylaxis: Place and maintain sequential compression device Start: 01/07/20 1315 Rivaroxaban (XARELTO) tablet 15 mg  rivaroxaban (XARELTO) tablet 20 mg   Code Status: Full code Family Communication: Patient's wife on the phone. Disposition Plan: Status is: Inpatient  Remains inpatient appropriate because:Inpatient level of care appropriate due to severity of illness   Dispo: The patient is from: Home              Anticipated d/c is to: Home with home health care              Anticipated d/c date is: > 3 days              Patient currently is not medically stable to d/c.         Consultants:   None  Procedures:   None  Antimicrobials:  Antibiotics Given (last 72 hours)    None       Finished remdesivir therapy  Subjective: Patient seen and examined.  Still has some pain on the right side with pulled muscle from coughing.  Otherwise he denies any complaints.  He felt better using BiPAP at night.  Currently on both nonrebreather and 15 L nasal cannula.  Objective: Vitals:   01/14/20 0552 01/14/20 0800 01/14/20 0825 01/14/20 1216  BP: (!) 144/79   134/73  Pulse: (!) 104   (!) 113  Resp: 17     Temp: 98 F (36.7 C)   97.8 F (  36.6 C)  TempSrc:    Oral  SpO2: 94% 90% (!) 86% (!) 86%  Weight:      Height:        Intake/Output Summary (Last 24 hours) at 01/14/2020 1400 Last data filed at 01/14/2020 1217 Gross per 24 hour  Intake 700 ml  Output 3025 ml  Net -2325 ml   Filed Weights   12/23/2019 1626  Weight: 102.1 kg    Examination:  General exam: Sick looking.  Mild respite distress on talking.  On high flow oxygen. Respiratory system: Poor bilateral air entry.  Poor inspiratory  forward. Cardiovascular system: S1 & S2 heard, tachycardic.  Regular.. Gastrointestinal system: Abdomen is nondistended, soft and nontender. No organomegaly or masses felt. Normal bowel sounds heard. Central nervous system: Alert and oriented. No focal neurological deficits. Extremities: Symmetric 5 x 5 power. Skin: No rashes, lesions or ulcers Psychiatry: Judgement and insight appear normal. Mood & affect appropriate.    Data Reviewed: I have personally reviewed following labs and imaging studies  CBC: Recent Labs  Lab 01/09/20 0355 01/10/20 0402 01/11/20 0458 01/12/20 0419 01/13/20 0341  WBC 8.9 12.6* 13.1* 12.4* 13.3*  NEUTROABS 8.4* 11.8* 12.2* 11.8* 12.5*  HGB 16.3 15.4 16.7 16.9 17.2*  HCT 49.6 46.0 49.7 51.0 51.4  MCV 90.3 89.0 88.9 89.5 88.5  PLT 138* 167 209 200 606   Basic Metabolic Panel: Recent Labs  Lab 01/09/20 0355 01/10/20 0402 01/11/20 0458 01/12/20 0419 01/13/20 0341  NA 139 135 137 138 138  K 4.3 4.4 4.4 4.2 4.2  CL 103 102 100 98 96*  CO2 25 23 24 27 27   GLUCOSE 198* 191* 185* 192* 191*  BUN 26* 30* 33* 42* 54*  CREATININE 0.82 0.85 0.97 1.09 0.99  CALCIUM 8.6* 8.4* 8.5* 9.0 9.2   GFR: Estimated Creatinine Clearance: 77.2 mL/min (by C-G formula based on SCr of 0.99 mg/dL). Liver Function Tests: Recent Labs  Lab 01/09/20 0355 01/10/20 0402 01/11/20 0458 01/12/20 0419 01/13/20 0341  AST 28 45* 31 20 23   ALT 36 63* 74* 63* 56*  ALKPHOS 69 59 62 63 65  BILITOT 1.2 1.2 1.4* 1.6* 1.4*  PROT 6.1* 5.6* 5.9* 6.3* 6.5  ALBUMIN 3.0* 2.6* 2.9* 2.8* 3.0*   No results for input(s): LIPASE, AMYLASE in the last 168 hours. No results for input(s): AMMONIA in the last 168 hours. Coagulation Profile: No results for input(s): INR, PROTIME in the last 168 hours. Cardiac Enzymes: No results for input(s): CKTOTAL, CKMB, CKMBINDEX, TROPONINI in the last 168 hours. BNP (last 3 results) No results for input(s): PROBNP in the last 8760 hours. HbA1C: No  results for input(s): HGBA1C in the last 72 hours. CBG: Recent Labs  Lab 01/13/20 1159 01/13/20 1614 01/13/20 2150 01/14/20 0827 01/14/20 1213  GLUCAP 328* 239* 181* 182* 282*   Lipid Profile: No results for input(s): CHOL, HDL, LDLCALC, TRIG, CHOLHDL, LDLDIRECT in the last 72 hours. Thyroid Function Tests: No results for input(s): TSH, T4TOTAL, FREET4, T3FREE, THYROIDAB in the last 72 hours. Anemia Panel: Recent Labs    01/13/20 0341 01/14/20 0453  FERRITIN 362* 424*   Sepsis Labs: No results for input(s): PROCALCITON, LATICACIDVEN in the last 168 hours.  No results found for this or any previous visit (from the past 240 hour(s)).       Radiology Studies: No results found.      Scheduled Meds: . alfuzosin  10 mg Oral q morning - 10a  . vitamin C  500 mg  Oral Daily  . baricitinib  4 mg Oral Daily  . ferrous sulfate  325 mg Oral Q breakfast  . furosemide  40 mg Intravenous Q12H  . insulin aspart  0-9 Units Subcutaneous TID WC  . insulin aspart  3 Units Subcutaneous TID WC  . insulin detemir  10 Units Subcutaneous BID  . loratadine  10 mg Oral Daily  . methylPREDNISolone (SOLU-MEDROL) injection  50 mg Intravenous Q12H  . montelukast  10 mg Oral QHS  . Rivaroxaban  15 mg Oral BID WC   Followed by  . [START ON 01/28/2020] rivaroxaban  20 mg Oral Q supper  . rosuvastatin  10 mg Oral Daily  . senna  1 tablet Oral BID  . vitamin B-12  500 mcg Oral Daily  . zinc sulfate  220 mg Oral Daily   Continuous Infusions: . sodium chloride       LOS: 12 days    Time spent: 35 minutes    Barb Merino, MD [facility] Pager [phone]

## 2020-01-14 NOTE — Progress Notes (Addendum)
Inpatient Diabetes Program Recommendations  AACE/ADA: New Consensus Statement on Inpatient Glycemic Control (2015)  Target Ranges:  Prepandial:   less than 140 mg/dL      Peak postprandial:   less than 180 mg/dL (1-2 hours)      Critically ill patients:  140 - 180 mg/dL   Lab Results  Component Value Date   GLUCAP 182 (H) 01/14/2020   HGBA1C 6.0 08/07/2019    Review of Glycemic Control Results for Connor [name], Connor [name] (MRN [id number]) as of 01/14/2020 09:41  Ref. Range 01/13/2020 08:09 01/13/2020 11:59 01/13/2020 16:14 01/13/2020 21:50 01/14/2020 08:27  Glucose-Capillary Latest Ref Range: 70 - 99 mg/dL 196 (H) 328 (H) 239 (H) 181 (H) 182 (H)   Diabetes history:  None  Current orders for Inpatient glycemic control:  Novolog 0-9 units tid Levemir 10 units daily  Inpatient Diabetes Program Recommendations:     Noted that Levemir 10 units was started last evening.  Postprandials elevated.  Please consider,  Novolog 2-3 units tid with meals   Will continue to follow while inpatient.  Thank you, Reche Dixon, RN, BSN Diabetes Coordinator Inpatient Diabetes Program [phone] (team pager from 8a-5p)

## 2020-01-15 DIAGNOSIS — U071 COVID-19: Secondary | ICD-10-CM | POA: Diagnosis not present

## 2020-01-15 DIAGNOSIS — J1282 Pneumonia due to Coronavirus disease 2019: Secondary | ICD-10-CM | POA: Diagnosis not present

## 2020-01-15 LAB — D-DIMER, QUANTITATIVE: D-Dimer, Quant: 0.72 ug/mL-FEU — ABNORMAL HIGH (ref 0.00–0.50)

## 2020-01-15 LAB — GLUCOSE, CAPILLARY
Glucose-Capillary: 151 mg/dL — ABNORMAL HIGH (ref 70–99)
Glucose-Capillary: 331 mg/dL — ABNORMAL HIGH (ref 70–99)
Glucose-Capillary: 191 mg/dL — ABNORMAL HIGH (ref 70–99)
Glucose-Capillary: 94 mg/dL (ref 70–99)

## 2020-01-15 LAB — C-REACTIVE PROTEIN: CRP: 1.2 mg/dL — ABNORMAL HIGH (ref <1.0)

## 2020-01-15 LAB — FERRITIN: Ferritin: 398 ng/mL — ABNORMAL HIGH (ref 24–336)

## 2020-01-15 MED ORDER — INSULIN ASPART 100 UNIT/ML ~~LOC~~ SOLN
5.0000 [IU] | Freq: Three times a day (TID) | SUBCUTANEOUS | Status: DC
  Administered 2020-01-15 – 2020-01-20 (×12): 5 [IU] via SUBCUTANEOUS

## 2020-01-15 NOTE — Progress Notes (Signed)
Physical Therapy Treatment Patient Details Name: Connor [name] MRN: [id number] DOB: 03-Jun-1945 Today's Date: 01/15/2020    History of Present Illness 74 y.o. male with medical history significant of Polycythemia Vera, BPH, mild COPD, carotid artery disease was diagnosed as COVID positive on 12/27/19. He was scheduled for and did receive monoclonal antibody infusion 12/17/2019. After treatment his O2 sat was 85%. He was referred to [facility] on 9/22 for evaluation and admission. He is unvaccinated. CXR with mild multifocal ground glass infiltrates.    PT Comments    O2 desaturation to 79% on HFNC + NRB with ambulation on today. Prolonged time to recover to 80%/81% while sitting EOB and performing pursed lip/deep breathing. Deferred any further ambulation and assisted pt back to bed. End of session:  O2 88%. Will continue to follow.     Follow Up Recommendations  Home health PT;Supervision/Assistance - 24 hour     Equipment Recommendations  Rolling walker with 5" wheels    Recommendations for Other Services       Precautions / Restrictions Precautions Precautions: Fall Restrictions Weight Bearing Restrictions: No    Mobility  Bed Mobility Overal bed mobility: Needs Assistance Bed Mobility: Supine to Sit;Sit to Supine     Supine to sit: Supervision;HOB elevated Sit to supine: Supervision;HOB elevated   General bed mobility comments: for safety.  Transfers Overall transfer level: Needs assistance Equipment used: Rolling walker (2 wheeled) Transfers: Sit to/from Stand Sit to Stand: Min assist         General transfer comment: Light assist to rise, stabilize.  Ambulation/Gait Ambulation/Gait assistance: Min assist Gait Distance (Feet): 15 Feet Assistive device: Rolling walker (2 wheeled)       General Gait Details: Light assist to steasdy. Used RW for improved stability. O2 79% on HFNC + NRB   Stairs             Wheelchair Mobility    Modified Rankin  (Stroke Patients Only)       Balance Overall balance assessment: Needs assistance           Standing balance-Leahy Scale: Poor                              Cognition Arousal/Alertness: Awake/alert Behavior During Therapy: WFL for tasks assessed/performed Overall Cognitive Status: Within Functional Limits for tasks assessed                                        Exercises      General Comments        Pertinent Vitals/Pain Pain Assessment: No/denies pain    Home Living                      Prior Function            PT Goals (current goals can now be found in the care plan section) Progress towards PT goals: Progressing toward goals    Frequency    Min 3X/week      PT Plan Current plan remains appropriate    Co-evaluation              AM-PAC PT "6 Clicks" Mobility   Outcome Measure  Help needed turning from your back to your side while in a flat bed without using bedrails?: None Help needed moving from lying on your  back to sitting on the side of a flat bed without using bedrails?: None Help needed moving to and from a bed to a chair (including a wheelchair)?: A Little Help needed standing up from a chair using your arms (e.g., wheelchair or bedside chair)?: A Little Help needed to walk in hospital room?: A Little Help needed climbing 3-5 steps with a railing? : A Little 6 Click Score: 20    End of Session Equipment Utilized During Treatment: Gait belt Activity Tolerance:  (Limited by O2 desaturation and slow recovery to >80%) Patient left: in bed;with call bell/phone within reach;with bed alarm set   PT Visit Diagnosis: Unsteadiness on feet (R26.81);Difficulty in walking, not elsewhere classified (R26.2)     Time: 7207-2182 PT Time Calculation (min) (ACUTE ONLY): 23 min  Charges:  $Gait Training: 23-37 mins                         Doreatha Massed, PT Acute Rehabilitation  Office: [phone] Pager:  [phone]

## 2020-01-15 NOTE — Progress Notes (Signed)
Inpatient Diabetes Program Recommendations  AACE/ADA: New Consensus Statement on Inpatient Glycemic Control (2015)  Target Ranges:  Prepandial:   less than 140 mg/dL      Peak postprandial:   less than 180 mg/dL (1-2 hours)      Critically ill patients:  140 - 180 mg/dL   Lab Results  Component Value Date   GLUCAP 331 (H) 01/15/2020   HGBA1C 6.0 08/07/2019    Review of Glycemic Control Results for Connor [name], Connor [name] (MRN [id number]) as of 01/15/2020 12:45  Ref. Range 01/14/2020 08:27 01/14/2020 12:13 01/14/2020 21:02 01/15/2020 07:30 01/15/2020 12:15  Glucose-Capillary Latest Ref Range: 70 - 99 mg/dL 182 (H) 282 (H) 178 (H) 151 (H) 331 (H)   Diabetes history:  None  Current orders for Inpatient glycemic control:  Novolog 0-9 units tid Levemir 10 units bid Novolog 3 units tid meal coverage  Solumedrol 50 mg Q12 hours  Inpatient Diabetes Program Recommendations:     Please consider increasing Novolog to 5 units tid meal coverage  Will continue to follow while inpatient.  Thank you, Tama Headings RN, MSN, BC-ADM Inpatient Diabetes Coordinator Team Pager [phone] (8a-5p)

## 2020-01-15 NOTE — Progress Notes (Signed)
PROGRESS NOTE    Connor [name]  MVH:[id number] DOB: [birth year] DOA: 12/23/2019 PCP: Dettinger, Fransisca Kaufmann, MD    Brief Narrative:  74 year old male with polycythemia vera, BPH, mild COPD, OSA on CPAP at home , coronary artery disease diagnosed with Covid on 9/16. Initially mild symptoms. Went to receive monoclonal antibody infusion on 9/22. After treatment oxygen was 85% so referred to ER. Unvaccinated against COVID-19. Chest x-ray with bilateral multifocal groundglass infiltrates. Remains in the hospital with very high oxygen requirement despite maximum medical therapy.  Assessment & Plan:   Principal Problem:   Pneumonia due to COVID-19 virus Active Problems:   Essential hypertension, benign   OSA (obstructive sleep apnea)   BPH (benign prostatic hyperplasia)   Hyperlipidemia LDL goal <100  Acute hypoxemic respiratory failure secondary to COVID-19 virus: Still on significant oxygen and using BiPAP at night.  Remains on high flow oxygen, however stable for last few days. chest physiotherapy, incentive spirometry, deep breathing exercises, sputum induction, mucolytic's and bronchodilators. Supplemental oxygen to keep saturations more than 82%. Covid directed therapy with , steroids, on high-dose Solu-Medrol, continue until clinical improvement. remdesivir, completed 5 days of therapy actemra /on baricitinib since 9/24, will complete 14 days of therapy. Due to severity of symptoms, patient will need daily inflammatory markers, chest x-rays, liver function test to monitor and direct COVID-19 therapies. CTA chest was negative for PE. Lower extremity duplex is positive for right lower extremity DVT. On Xarelto. Continue IV diuresis trial. Echocardiogram with normal ejection fraction.  COVID-19 Labs  Recent Labs    01/13/20 0341 01/14/20 0453 01/15/20 0421  DDIMER 1.34* 0.83* 0.72*  FERRITIN 362* 424* 398*  CRP 5.0* 2.3* 1.2*    No results found for: SARSCOV2NAA   SpO2:  90 % O2 Flow Rate (L/min): 15 L/min FiO2 (%): 100 %   Essential hypertension: Blood pressure fairly stable. Cozaar on hold.  Hyperlipidemia: On Crestor. Continued.  Bilateral DVT: Initially treated with Lovenox and now on Xarelto.  Continue.  Sleep apnea: Using BiPAP at night with high flow oxygen.  Tolerating well.   DVT prophylaxis: Place and maintain sequential compression device Start: 01/07/20 1315 Rivaroxaban (XARELTO) tablet 15 mg  rivaroxaban (XARELTO) tablet 20 mg   Code Status: Full code Family Communication: Patient's wife on the phone and updated.  Disposition Plan: Status is: Inpatient  Remains inpatient appropriate because:Inpatient level of care appropriate due to severity of illness   Dispo: The patient is from: Home              Anticipated d/c is to: Home with home health care              Anticipated d/c date is: > 3 days              Patient currently is not medically stable to d/c.   Consultants:   None  Procedures:   None  Antimicrobials:  Antibiotics Given (last 72 hours)    None       Finished remdesivir therapy  Subjective: Patient was seen and examined.  No overnight events, however remains on 15 L high flow along with 15 L nonrebreather oxygen on mobility.  He was sitting in chair and eating his breakfast, he looks comfortable and denied any complaints today.  Objective: Vitals:   01/15/20 0436 01/15/20 0731 01/15/20 0817 01/15/20 1218  BP: (!) 144/81   135/78  Pulse: (!) 104   (!) 118  Resp: 18   15  Temp: 97.7 F (  36.5 C)   97.8 F (36.6 C)  TempSrc: Oral   Oral  SpO2: 94% (!) 86% (!) 85% 90%  Weight:      Height:        Intake/Output Summary (Last 24 hours) at 01/15/2020 1329 Last data filed at 01/15/2020 1312 Gross per 24 hour  Intake 300 ml  Output 2700 ml  Net -2400 ml   Filed Weights   12/17/2019 1626  Weight: 102.1 kg    Examination:  General exam: Sick looking.  Mild conversational dyspnea.  He looks  comfortable otherwise on 2 sources of high flow oxygen. Respiratory system: Poor bilateral air entry.  Poor inspiratory forward. Cardiovascular system: S1 & S2 heard, tachycardic.  Regular.. Gastrointestinal system: Abdomen is nondistended, soft and nontender. No organomegaly or masses felt. Normal bowel sounds heard. Central nervous system: Alert and oriented. No focal neurological deficits. Extremities: Symmetric 5 x 5 power. Skin: No rashes, lesions or ulcers Psychiatry: Judgement and insight appear normal. Mood & affect appropriate.    Data Reviewed: I have personally reviewed following labs and imaging studies  CBC: Recent Labs  Lab 01/09/20 0355 01/10/20 0402 01/11/20 0458 01/12/20 0419 01/13/20 0341  WBC 8.9 12.6* 13.1* 12.4* 13.3*  NEUTROABS 8.4* 11.8* 12.2* 11.8* 12.5*  HGB 16.3 15.4 16.7 16.9 17.2*  HCT 49.6 46.0 49.7 51.0 51.4  MCV 90.3 89.0 88.9 89.5 88.5  PLT 138* 167 209 200 161   Basic Metabolic Panel: Recent Labs  Lab 01/09/20 0355 01/10/20 0402 01/11/20 0458 01/12/20 0419 01/13/20 0341  NA 139 135 137 138 138  K 4.3 4.4 4.4 4.2 4.2  CL 103 102 100 98 96*  CO2 25 23 24 27 27   GLUCOSE 198* 191* 185* 192* 191*  BUN 26* 30* 33* 42* 54*  CREATININE 0.82 0.85 0.97 1.09 0.99  CALCIUM 8.6* 8.4* 8.5* 9.0 9.2   GFR: Estimated Creatinine Clearance: 77.2 mL/min (by C-G formula based on SCr of 0.99 mg/dL). Liver Function Tests: Recent Labs  Lab 01/09/20 0355 01/10/20 0402 01/11/20 0458 01/12/20 0419 01/13/20 0341  AST 28 45* 31 20 23   ALT 36 63* 74* 63* 56*  ALKPHOS 69 59 62 63 65  BILITOT 1.2 1.2 1.4* 1.6* 1.4*  PROT 6.1* 5.6* 5.9* 6.3* 6.5  ALBUMIN 3.0* 2.6* 2.9* 2.8* 3.0*   No results for input(s): LIPASE, AMYLASE in the last 168 hours. No results for input(s): AMMONIA in the last 168 hours. Coagulation Profile: No results for input(s): INR, PROTIME in the last 168 hours. Cardiac Enzymes: No results for input(s): CKTOTAL, CKMB, CKMBINDEX,  TROPONINI in the last 168 hours. BNP (last 3 results) No results for input(s): PROBNP in the last 8760 hours. HbA1C: No results for input(s): HGBA1C in the last 72 hours. CBG: Recent Labs  Lab 01/14/20 0827 01/14/20 1213 01/14/20 2102 01/15/20 0730 01/15/20 1215  GLUCAP 182* 282* 178* 151* 331*   Lipid Profile: No results for input(s): CHOL, HDL, LDLCALC, TRIG, CHOLHDL, LDLDIRECT in the last 72 hours. Thyroid Function Tests: No results for input(s): TSH, T4TOTAL, FREET4, T3FREE, THYROIDAB in the last 72 hours. Anemia Panel: Recent Labs    01/14/20 0453 01/15/20 0421  FERRITIN 424* 398*   Sepsis Labs: No results for input(s): PROCALCITON, LATICACIDVEN in the last 168 hours.  No results found for this or any previous visit (from the past 240 hour(s)).       Radiology Studies: No results found.      Scheduled Meds: . alfuzosin  10 mg Oral  q morning - 10a  . vitamin C  500 mg Oral Daily  . baricitinib  4 mg Oral Daily  . ferrous sulfate  325 mg Oral Q breakfast  . furosemide  40 mg Intravenous Q12H  . insulin aspart  0-9 Units Subcutaneous TID WC  . insulin aspart  5 Units Subcutaneous TID WC  . insulin detemir  10 Units Subcutaneous BID  . loratadine  10 mg Oral Daily  . methylPREDNISolone (SOLU-MEDROL) injection  50 mg Intravenous Q12H  . montelukast  10 mg Oral QHS  . Rivaroxaban  15 mg Oral BID WC   Followed by  . [START ON 01/28/2020] rivaroxaban  20 mg Oral Q supper  . rosuvastatin  10 mg Oral Daily  . senna  1 tablet Oral BID  . vitamin B-12  500 mcg Oral Daily  . zinc sulfate  220 mg Oral Daily   Continuous Infusions: . sodium chloride       LOS: 13 days    Time spent: 30 minutes    Barb Merino, MD [facility] Pager [phone]

## 2020-01-16 ENCOUNTER — Inpatient Hospital Stay (HOSPITAL_COMMUNITY): Payer: Medicare Other

## 2020-01-16 DIAGNOSIS — U071 COVID-19: Secondary | ICD-10-CM | POA: Diagnosis not present

## 2020-01-16 DIAGNOSIS — J1282 Pneumonia due to Coronavirus disease 2019: Secondary | ICD-10-CM | POA: Diagnosis not present

## 2020-01-16 LAB — FERRITIN: Ferritin: 437 ng/mL — ABNORMAL HIGH (ref 24–336)

## 2020-01-16 LAB — COMPREHENSIVE METABOLIC PANEL
ALT: 49 U/L — ABNORMAL HIGH (ref 0–44)
AST: 21 U/L (ref 15–41)
Albumin: 2.8 g/dL — ABNORMAL LOW (ref 3.5–5.0)
Alkaline Phosphatase: 65 U/L (ref 38–126)
Anion gap: 12 (ref 5–15)
BUN: 55 mg/dL — ABNORMAL HIGH (ref 8–23)
CO2: 29 mmol/L (ref 22–32)
Calcium: 8.8 mg/dL — ABNORMAL LOW (ref 8.9–10.3)
Chloride: 93 mmol/L — ABNORMAL LOW (ref 98–111)
Creatinine, Ser: 1.03 mg/dL (ref 0.61–1.24)
GFR calc non Af Amer: >60 mL/min (ref >60)
Glucose, Bld: 164 mg/dL — ABNORMAL HIGH (ref 70–99)
Potassium: 4.2 mmol/L (ref 3.5–5.1)
Sodium: 134 mmol/L — ABNORMAL LOW (ref 135–145)
Total Bilirubin: 1.7 mg/dL — ABNORMAL HIGH (ref 0.3–1.2)
Total Protein: 6.1 g/dL — ABNORMAL LOW (ref 6.5–8.1)

## 2020-01-16 LAB — CBC WITH DIFFERENTIAL/PLATELET
Abs Immature Granulocytes: 0.21 10*3/uL — ABNORMAL HIGH (ref 0.00–0.07)
Basophils Absolute: 0 10*3/uL (ref 0.0–0.1)
Basophils Relative: 0 %
Eosinophils Absolute: 0 10*3/uL (ref 0.0–0.5)
Eosinophils Relative: 0 %
HCT: 52.5 % — ABNORMAL HIGH (ref 39.0–52.0)
Hemoglobin: 17.7 g/dL — ABNORMAL HIGH (ref 13.0–17.0)
Immature Granulocytes: 1 %
Lymphocytes Relative: 1 %
Lymphs Abs: 0.2 10*3/uL — ABNORMAL LOW (ref 0.7–4.0)
MCH: 29.7 pg (ref 26.0–34.0)
MCHC: 33.7 g/dL (ref 30.0–36.0)
MCV: 88.2 fL (ref 80.0–100.0)
Monocytes Absolute: 0.7 10*3/uL (ref 0.1–1.0)
Monocytes Relative: 3 %
Neutro Abs: 20.7 10*3/uL — ABNORMAL HIGH (ref 1.7–7.7)
Neutrophils Relative %: 95 %
Platelets: 221 10*3/uL (ref 150–400)
RBC: 5.95 MIL/uL — ABNORMAL HIGH (ref 4.22–5.81)
RDW: 11.8 % (ref 11.5–15.5)
WBC: 21.9 10*3/uL — ABNORMAL HIGH (ref 4.0–10.5)
nRBC: 0 % (ref 0.0–0.2)

## 2020-01-16 LAB — GLUCOSE, CAPILLARY
Glucose-Capillary: 165 mg/dL — ABNORMAL HIGH (ref 70–99)
Glucose-Capillary: 324 mg/dL — ABNORMAL HIGH (ref 70–99)
Glucose-Capillary: 259 mg/dL — ABNORMAL HIGH (ref 70–99)
Glucose-Capillary: 133 mg/dL — ABNORMAL HIGH (ref 70–99)

## 2020-01-16 MED ORDER — SENNOSIDES-DOCUSATE SODIUM 8.6-50 MG PO TABS
1.0000 | ORAL_TABLET | Freq: Two times a day (BID) | ORAL | Status: DC
  Administered 2020-01-16 – 2020-01-20 (×8): 1 via ORAL
  Filled 2020-01-16 (×10): qty 1

## 2020-01-16 MED ORDER — VITAMIN D (ERGOCALCIFEROL) 1.25 MG (50000 UNIT) PO CAPS
50000.0000 [IU] | ORAL_CAPSULE | ORAL | Status: DC
  Administered 2020-01-17: 50000 [IU] via ORAL
  Filled 2020-01-16: qty 1

## 2020-01-16 NOTE — Progress Notes (Signed)
PROGRESS NOTE    Connor [name]  OBS:[id number] DOB: 23-Mar-1946 DOA: 12/23/2019 PCP: Dettinger, Fransisca Kaufmann, MD    Brief Narrative:  74 year old male with polycythemia vera, BPH, mild COPD, OSA on CPAP at home , coronary artery disease diagnosed with Covid on 9/16. Initially mild symptoms. Went to receive monoclonal antibody infusion on 9/22. After treatment oxygen was 85% so referred to ER. Unvaccinated against COVID-19. Chest x-ray with bilateral multifocal groundglass infiltrates. Remains in the hospital with very high oxygen requirement despite maximum medical therapy.  Assessment & Plan: Acute hypoxemic respiratory failure secondary to COVID-19 virus: Still on significant oxygen and using BiPAP at night.  Remains on high flow oxygen, however stable for last few days. chest physiotherapy, incentive spirometry, deep breathing exercises, sputum induction, mucolytic's and bronchodilators. remdesivir, completed 5 days of therapy actemra /on baricitinib since 9/24, will complete 14 days of therapy. CTA chest was negative for PE. Lower extremity duplex is positive for right lower extremity DVT. On Xarelto. Continue IV diuresis trial. Echocardiogram with normal ejection fraction.  Essential hypertension:  Blood pressure fairly stable. Cozaar on hold.  Hyperlipidemia:  On Crestor. Continued.  Bilateral DVT:  Initially treated with Lovenox and now on Xarelto.  Continue.  Sleep apnea:  Using BiPAP at night with high flow oxygen.  Tolerating well.   DVT prophylaxis: Place and maintain sequential compression device Start: 01/07/20 1315 Rivaroxaban (XARELTO) tablet 15 mg  rivaroxaban (XARELTO) tablet 20 mg   Code Status: Full code Family Communication: Patient's wife on the phone and updated.  Disposition Plan: Status is: Inpatient  Remains inpatient appropriate because:Inpatient level of care appropriate due to severity of illness   Dispo: The patient is from: Home               Anticipated d/c is to: Home with home health care              Anticipated d/c date is: > 3 days              Patient currently is not medically stable to d/c.   Consultants:   None  Procedures:   None  Antimicrobials:  Antibiotics Given (last 72 hours)    None       Finished remdesivir therapy  Subjective: Mild abdominal pain reported.  No nausea no vomiting.  Oral intake adequate.  Constipation reported.  Objective: Vitals:   01/16/20 0604 01/16/20 0834 01/16/20 1259 01/16/20 1948  BP: (!) 144/77  126/79 (!) 149/84  Pulse: (!) 115 (!) 120 (!) 124 (!) 112  Resp: 20 (!) 25 18 20   Temp: 97.7 F (36.5 C)  97.6 F (36.4 C)   TempSrc: Oral  Oral   SpO2: 92% (!) 81% (!) 88% (!) 88%  Weight:      Height:        Intake/Output Summary (Last 24 hours) at 01/16/2020 2016 Last data filed at 01/16/2020 1727 Gross per 24 hour  Intake 480 ml  Output 3220 ml  Net -2740 ml   Filed Weights   12/26/2019 1626  Weight: 102.1 kg    Examination:  General: Appear in mild distress, no Rash; Oral Mucosa Clear, moist. no Abnormal Neck Mass Or lumps, Conjunctiva normal  Cardiovascular: S1 and S2 Present, no Murmur, Respiratory: increased respiratory effort, Bilateral Air entry present and bilateral Crackles, no wheezes Abdomen: Bowel Sound present, Soft and mild tenderness Extremities: no Pedal edema Neurology: alert and oriented to time, place, and person affect appropriate. no new focal deficit  Gait not checked due to patient safety concerns     Data Reviewed: I have personally reviewed following labs and imaging studies  CBC: Recent Labs  Lab 01/10/20 0402 01/11/20 0458 01/12/20 0419 01/13/20 0341 01/16/20 0517  WBC 12.6* 13.1* 12.4* 13.3* 21.9*  NEUTROABS 11.8* 12.2* 11.8* 12.5* 20.7*  HGB 15.4 16.7 16.9 17.2* 17.7*  HCT 46.0 49.7 51.0 51.4 52.5*  MCV 89.0 88.9 89.5 88.5 88.2  PLT 167 209 200 202 938   Basic Metabolic Panel: Recent Labs  Lab 01/10/20 0402  01/11/20 0458 01/12/20 0419 01/13/20 0341 01/16/20 0517  NA 135 137 138 138 134*  K 4.4 4.4 4.2 4.2 4.2  CL 102 100 98 96* 93*  CO2 23 24 27 27 29   GLUCOSE 191* 185* 192* 191* 164*  BUN 30* 33* 42* 54* 55*  CREATININE 0.85 0.97 1.09 0.99 1.03  CALCIUM 8.4* 8.5* 9.0 9.2 8.8*   GFR: Estimated Creatinine Clearance: 74.2 mL/min (by C-G formula based on SCr of 1.03 mg/dL). Liver Function Tests: Recent Labs  Lab 01/10/20 0402 01/11/20 0458 01/12/20 0419 01/13/20 0341 01/16/20 0517  AST 45* 31 20 23 21   ALT 63* 74* 63* 56* 49*  ALKPHOS 59 62 63 65 65  BILITOT 1.2 1.4* 1.6* 1.4* 1.7*  PROT 5.6* 5.9* 6.3* 6.5 6.1*  ALBUMIN 2.6* 2.9* 2.8* 3.0* 2.8*   No results for input(s): LIPASE, AMYLASE in the last 168 hours. No results for input(s): AMMONIA in the last 168 hours. Coagulation Profile: No results for input(s): INR, PROTIME in the last 168 hours. Cardiac Enzymes: No results for input(s): CKTOTAL, CKMB, CKMBINDEX, TROPONINI in the last 168 hours. BNP (last 3 results) No results for input(s): PROBNP in the last 8760 hours. HbA1C: No results for input(s): HGBA1C in the last 72 hours. CBG: Recent Labs  Lab 01/15/20 1621 01/15/20 2107 01/16/20 0727 01/16/20 1133 01/16/20 1615  GLUCAP 191* 94 165* 324* 259*   Lipid Profile: No results for input(s): CHOL, HDL, LDLCALC, TRIG, CHOLHDL, LDLDIRECT in the last 72 hours. Thyroid Function Tests: No results for input(s): TSH, T4TOTAL, FREET4, T3FREE, THYROIDAB in the last 72 hours. Anemia Panel: Recent Labs    01/15/20 0421 01/16/20 0517  FERRITIN 398* 437*   Sepsis Labs: No results for input(s): PROCALCITON, LATICACIDVEN in the last 168 hours.  No results found for this or any previous visit (from the past 240 hour(s)).       Radiology Studies: DG Abd Portable 1V  Result Date: 01/16/2020 CLINICAL DATA:  COVID with abdominal pain EXAM: PORTABLE ABDOMEN - 1 VIEW COMPARISON:  11/29/2017 FINDINGS: The bowel gas pattern  is normal. No radio-opaque calculi or other significant radiographic abnormality are seen. IMPRESSION: Negative. Electronically Signed   By: Donavan Foil [name]   On: 01/16/2020 18:46        Scheduled Meds: . alfuzosin  10 mg Oral q morning - 10a  . vitamin C  500 mg Oral Daily  . baricitinib  4 mg Oral Daily  . ferrous sulfate  325 mg Oral Q breakfast  . furosemide  40 mg Intravenous Q12H  . insulin aspart  0-9 Units Subcutaneous TID WC  . insulin aspart  5 Units Subcutaneous TID WC  . insulin detemir  10 Units Subcutaneous BID  . loratadine  10 mg Oral Daily  . methylPREDNISolone (SOLU-MEDROL) injection  50 mg Intravenous Q12H  . montelukast  10 mg Oral QHS  . Rivaroxaban  15 mg Oral BID WC   Followed by  . [  START ON 01/28/2020] rivaroxaban  20 mg Oral Q supper  . rosuvastatin  10 mg Oral Daily  . senna-docusate  1 tablet Oral BID  . vitamin B-12  500 mcg Oral Daily  . [START ON 01/17/2020] Vitamin D (Ergocalciferol)  50,000 Units Oral Q7 days  . zinc sulfate  220 mg Oral Daily   Continuous Infusions: . sodium chloride       LOS: 14 days    Time spent: 30 minutes    Berle Mull, MD [facility] Pager [phone]

## 2020-01-16 NOTE — TOC Progression Note (Signed)
Transition of Care [facility]) - Progression Note    Patient Details  Name: Connor [name] MRN: [id number] Date of Birth: [birth year]  Transition of Care [facility]) CM/SW Contact  Purcell Mouton, RN Phone Number: 01/16/2020, 2:42 PM  Clinical Narrative:     Spoke with pt's wife concerning [place] at discharge. Mrs. Mario was out and asked that I call her back in AM for name of [place]. Pt has a rolling walker at home.        Expected Discharge Plan and Services                                                 Social Determinants of Health (SDOH) Interventions    Readmission Risk Interventions No flowsheet data found.

## 2020-01-16 NOTE — Care Management Important Message (Signed)
Important Message  Patient Details IM Letter given to the Patient Name: Connor [name] MRN: [id number] Date of Birth: [birth year]   Medicare Important Message Given:  Yes     Kerin Salen 01/16/2020, 10:36 AM

## 2020-01-16 NOTE — Progress Notes (Signed)
Occupational Therapy Progress Note  Patient agreeable to sitting EOB for g/h tasks. Patient set up to wash face, head, neck and with NRB doffed patient desaturate to low 70s. Allow for increased time ~15 mins at EOB with education on diaphragmatic breathing with 15L HFNC and NRB however patient unable to maintain low 80s therefore returned to bed. Once semi-supine patient increase to mid 80s. Notified RN.     01/16/20 0900  OT Visit Information  Last OT Received On 01/16/20  Assistance Needed +1  History of Present Illness 74 y.o. male with medical history significant of Polycythemia Vera, BPH, mild COPD, carotid artery disease was diagnosed as COVID positive on 12/27/19. He was scheduled for and did receive monoclonal antibody infusion 12/16/2019. After treatment his O2 sat was 85%. He was referred to [facility] on 9/22 for evaluation and admission. He is unvaccinated. CXR with mild multifocal ground glass infiltrates.  Precautions  Precautions Fall  Precaution Comments monitor vitals, on 15L HFNC and NRB  Pain Assessment  Pain Assessment No/denies pain  Cognition  Arousal/Alertness Awake/alert  Behavior During Therapy WFL for tasks assessed/performed  Overall Cognitive Status Within Functional Limits for tasks assessed  ADL  Overall ADL's  Needs assistance/impaired  Grooming Set up;Wash/dry face;Sitting  Grooming Details (indicate cue type and reason) patient doff NRB to wash face, head, neck and desaturates to low 70s with 15L HFNC still on  General ADL Comments allowed for prolonged seated rest at EOB ~15 mins after g/h without NRB however patient unable to maintain in low 80s therefore had patient return to semi-supine. educate patient on diaphragmatic breathing techniques to improve recovery  Bed Mobility  Overal bed mobility Needs Assistance  Bed Mobility Supine to Sit;Sit to Supine  Supine to sit Supervision;HOB elevated  Sit to supine Supervision;HOB elevated  General bed mobility  comments for safety.  Balance  Overall balance assessment Needs assistance  Sitting-balance support No upper extremity supported;Feet supported  Sitting balance-Leahy Scale Good  Transfers  General transfer comment deferred due to saturations   OT - End of Session  Equipment Utilized During Treatment Oxygen  Activity Tolerance Patient limited by fatigue  Patient left in bed;with call bell/phone within reach  Nurse Communication Mobility status;Other (comment) (O2 saturations)  OT Assessment/Plan  OT Plan Discharge plan remains appropriate  OT Visit Diagnosis Unsteadiness on feet (R26.81);Muscle weakness (generalized) (M62.81)  OT Frequency (ACUTE ONLY) Min 2X/week  Follow Up Recommendations No OT follow up  OT Equipment None recommended by OT  AM-PAC OT "6 Clicks" Daily Activity Outcome Measure (Version 2)  Help from another person eating meals? 4  Help from another person taking care of personal grooming? 3  Help from another person toileting, which includes using toliet, bedpan, or urinal? 3  Help from another person bathing (including washing, rinsing, drying)? 3  Help from another person to put on and taking off regular upper body clothing? 3  Help from another person to put on and taking off regular lower body clothing? 3  6 Click Score 19  OT Goal Progression  Progress towards OT goals Progressing toward goals  Acute Rehab OT Goals  Patient Stated Goal breathe better, go home  OT Goal Formulation With patient  Time For Goal Achievement 01/19/20  Potential to Achieve Goals Good  OT Time Calculation  OT Start Time (ACUTE ONLY) 0817  OT Stop Time (ACUTE ONLY) 0843  OT Time Calculation (min) 26 min  OT General Charges  $OT Visit 1 Visit  OT Treatments  $  Self Care/Home Management  23-37 mins   Delbert Phenix OT OT pager: [phone]

## 2020-01-17 DIAGNOSIS — J1282 Pneumonia due to Coronavirus disease 2019: Secondary | ICD-10-CM | POA: Diagnosis not present

## 2020-01-17 DIAGNOSIS — U071 COVID-19: Secondary | ICD-10-CM | POA: Diagnosis not present

## 2020-01-17 LAB — CBC WITH DIFFERENTIAL/PLATELET
Abs Immature Granulocytes: 0.18 10*3/uL — ABNORMAL HIGH (ref 0.00–0.07)
Basophils Absolute: 0 10*3/uL (ref 0.0–0.1)
Basophils Relative: 0 %
Eosinophils Absolute: 0 10*3/uL (ref 0.0–0.5)
Eosinophils Relative: 0 %
HCT: 51.9 % (ref 39.0–52.0)
Hemoglobin: 17.6 g/dL — ABNORMAL HIGH (ref 13.0–17.0)
Immature Granulocytes: 1 %
Lymphocytes Relative: 2 %
Lymphs Abs: 0.3 10*3/uL — ABNORMAL LOW (ref 0.7–4.0)
MCH: 29.8 pg (ref 26.0–34.0)
MCHC: 33.9 g/dL (ref 30.0–36.0)
MCV: 88 fL (ref 80.0–100.0)
Monocytes Absolute: 0.6 10*3/uL (ref 0.1–1.0)
Monocytes Relative: 3 %
Neutro Abs: 18.6 10*3/uL — ABNORMAL HIGH (ref 1.7–7.7)
Neutrophils Relative %: 94 %
Platelets: 232 10*3/uL (ref 150–400)
RBC: 5.9 MIL/uL — ABNORMAL HIGH (ref 4.22–5.81)
RDW: 11.8 % (ref 11.5–15.5)
WBC: 19.7 10*3/uL — ABNORMAL HIGH (ref 4.0–10.5)
nRBC: 0 % (ref 0.0–0.2)

## 2020-01-17 LAB — COMPREHENSIVE METABOLIC PANEL
ALT: 43 U/L (ref 0–44)
AST: 22 U/L (ref 15–41)
Albumin: 2.7 g/dL — ABNORMAL LOW (ref 3.5–5.0)
Alkaline Phosphatase: 68 U/L (ref 38–126)
Anion gap: 13 (ref 5–15)
BUN: 59 mg/dL — ABNORMAL HIGH (ref 8–23)
CO2: 30 mmol/L (ref 22–32)
Calcium: 9.1 mg/dL (ref 8.9–10.3)
Chloride: 90 mmol/L — ABNORMAL LOW (ref 98–111)
Creatinine, Ser: 0.92 mg/dL (ref 0.61–1.24)
GFR calc non Af Amer: >60 mL/min (ref >60)
Glucose, Bld: 176 mg/dL — ABNORMAL HIGH (ref 70–99)
Potassium: 4.4 mmol/L (ref 3.5–5.1)
Sodium: 133 mmol/L — ABNORMAL LOW (ref 135–145)
Total Bilirubin: 1.5 mg/dL — ABNORMAL HIGH (ref 0.3–1.2)
Total Protein: 6.1 g/dL — ABNORMAL LOW (ref 6.5–8.1)

## 2020-01-17 LAB — GLUCOSE, CAPILLARY
Glucose-Capillary: 173 mg/dL — ABNORMAL HIGH (ref 70–99)
Glucose-Capillary: 279 mg/dL — ABNORMAL HIGH (ref 70–99)
Glucose-Capillary: 201 mg/dL — ABNORMAL HIGH (ref 70–99)
Glucose-Capillary: 217 mg/dL — ABNORMAL HIGH (ref 70–99)

## 2020-01-17 LAB — C-REACTIVE PROTEIN: CRP: 1.8 mg/dL — ABNORMAL HIGH (ref <1.0)

## 2020-01-17 LAB — D-DIMER, QUANTITATIVE: D-Dimer, Quant: 0.77 ug/mL-FEU — ABNORMAL HIGH (ref 0.00–0.50)

## 2020-01-17 MED ORDER — NYSTATIN 100000 UNIT/ML MT SUSP
5.0000 mL | Freq: Four times a day (QID) | OROMUCOSAL | Status: DC
  Administered 2020-01-17 – 2020-01-26 (×16): 500000 [IU] via ORAL
  Filled 2020-01-17 (×17): qty 5

## 2020-01-17 MED ORDER — GLUCERNA SHAKE PO LIQD
237.0000 mL | Freq: Three times a day (TID) | ORAL | Status: DC
  Administered 2020-01-17 – 2020-01-25 (×10): 237 mL via ORAL
  Filled 2020-01-17 (×30): qty 237

## 2020-01-17 NOTE — Progress Notes (Signed)
Inpatient Diabetes Program Recommendations  AACE/ADA: New Consensus Statement on Inpatient Glycemic Control (2015)  Target Ranges:  Prepandial:   less than 140 mg/dL      Peak postprandial:   less than 180 mg/dL (1-2 hours)      Critically ill patients:  140 - 180 mg/dL   Lab Results  Component Value Date   GLUCAP 173 (H) 01/17/2020   HGBA1C 6.0 08/07/2019    Review of Glycemic Control Results for Connor [name], Connor [name] (MRN [id number]) as of 01/17/2020 10:29  Ref. Range 01/16/2020 07:27 01/16/2020 11:33 01/16/2020 16:15 01/16/2020 20:53 01/17/2020 08:08  Glucose-Capillary Latest Ref Range: 70 - 99 mg/dL 165 (H) 324 (H) 259 (H) 133 (H) 173 (H)   Diabetes history:  None  Current orders for Inpatient glycemic control:  Novolog 0-9 units tid Levemir 10 units bid Novolog 5 units tid meal coverage  Solumedrol 50 mg Q12 hours Glucerna tid between meals  Inpatient Diabetes Program Recommendations:     Please consider increasing Novolog to 7 units tid meal coverage  Will continue to follow while inpatient.  Thank you, Tama Headings RN, MSN, BC-ADM Inpatient Diabetes Coordinator Team Pager [phone] (8a-5p)

## 2020-01-17 NOTE — Progress Notes (Signed)
Physical Therapy Treatment Patient Details Name: Connor [name] MRN: [id number] DOB: June 10, 1945 Today's Date: 01/17/2020    History of Present Illness 74 y.o. male with medical history significant of Polycythemia Vera, BPH, mild COPD, carotid artery disease was diagnosed as COVID positive on 12/27/19. He was scheduled for and did receive monoclonal antibody infusion 01/04/2020. After treatment his O2 sat was 85%. He was referred to [facility] on 9/22 for evaluation and admission. He is unvaccinated. CXR with mild multifocal ground glass infiltrates.    PT Comments    Pt reports constipation and has not been out of bed today.  Pt assisted to recliner and SPO2 dropped to 70% (on 15L HFNC and NRB).    Follow Up Recommendations  Home health PT;Supervision/Assistance - 24 hour     Equipment Recommendations  Rolling walker with 5" wheels    Recommendations for Other Services       Precautions / Restrictions Precautions Precautions: Fall Precaution Comments: monitor vitals, on 15L HFNC and NRB    Mobility  Bed Mobility Overal bed mobility: Needs Assistance Bed Mobility: Supine to Sit     Supine to sit: Supervision;HOB elevated     General bed mobility comments: for safety.  Transfers Overall transfer level: Needs assistance   Transfers: Sit to/from Stand Sit to Stand: Min guard         General transfer comment: min/guard for safety, Spo2 dropped to 70% on 15L HFNC and NRB, required at least 8 minutes to recover back to 86%  Ambulation/Gait                 Stairs             Wheelchair Mobility    Modified Rankin (Stroke Patients Only)       Balance                                            Cognition Arousal/Alertness: Awake/alert Behavior During Therapy: WFL for tasks assessed/performed Overall Cognitive Status: Within Functional Limits for tasks assessed                                        Exercises       General Comments        Pertinent Vitals/Pain Pain Assessment: Faces Faces Pain Scale: Hurts little more Pain Location: abdomen (reports constipation) Pain Descriptors / Indicators: Sore Pain Intervention(s): Repositioned;Monitored during session    Home Living                      Prior Function            PT Goals (current goals can now be found in the care plan section) Progress towards PT goals: Progressing toward goals    Frequency    Min 3X/week      PT Plan Current plan remains appropriate    Co-evaluation              AM-PAC PT "6 Clicks" Mobility   Outcome Measure  Help needed turning from your back to your side while in a flat bed without using bedrails?: None Help needed moving from lying on your back to sitting on the side of a flat bed without using bedrails?: None Help needed moving to and  from a bed to a chair (including a wheelchair)?: A Little Help needed standing up from a chair using your arms (e.g., wheelchair or bedside chair)?: A Little Help needed to walk in hospital room?: A Little Help needed climbing 3-5 steps with a railing? : A Little 6 Click Score: 20    End of Session Equipment Utilized During Treatment: Gait belt Activity Tolerance: Other (comment) (limited by high oxygen requirement and drop in saturations with activity) Patient left: with call bell/phone within reach;in chair (agreeable to use call bell for assist out of recliner)   PT Visit Diagnosis: Unsteadiness on feet (R26.81);Difficulty in walking, not elsewhere classified (R26.2)     Time: 7035-0093 PT Time Calculation (min) (ACUTE ONLY): 20 min  Charges:  $Therapeutic Activity: 8-22 mins                     Jannette Spanner PT, DPT Acute Rehabilitation Services Pager: [phone] Office: [phone]  York Ram E 01/17/2020, 4:14 PM

## 2020-01-17 NOTE — TOC Progression Note (Signed)
Transition of Care [facility]) - Progression Note    Patient Details  Name: Connor [name] MRN: [id number] Date of Birth: 12-Jan-1946  Transition of [facility]) CM/SW Contact  Purcell Mouton, RN Phone Number: 01/17/2020, 11:00 AM  Clinical Narrative:     Spoke with Wife Mrs. Suriano in length, who is very pleasant. Mrs Witucki had some concerns which was shared with MD. Mrs. Tamala Julian selected [facility] for home O2, and [facility] who uses [facility] for Home Health needs. Mrs. Ozaki states, Pt have CPAP at home and asked if he will need BIPAP.        Expected Discharge Plan and Services                                                 Social Determinants of Health (SDOH) Interventions    Readmission Risk Interventions No flowsheet data found.

## 2020-01-17 NOTE — Progress Notes (Addendum)
   01/16/20 1948  Assess: MEWS Score  Temp 97.6 F (36.4 C)  BP (!) 149/84  Pulse Rate (!) 112  ECG Heart Rate (!) 112  Resp 20  Level of Consciousness Alert  SpO2 90 %  O2 Device HFNC  Patient Activity (if Appropriate) In bed  O2 Flow Rate (L/min) 15 L/min  Assess: MEWS Score  MEWS Temp 0  MEWS Systolic 0  MEWS Pulse 2  MEWS RR 0  MEWS LOC 0  MEWS Score 2  MEWS Score Color Yellow  Assess: if the MEWS score is Yellow or Red  Were vital signs taken at a resting state? Yes  Focused Assessment No change from prior assessment  Early Detection of Sepsis Score *See Row Information* Low  MEWS guidelines implemented *See Row Information* Yes  Treat  MEWS Interventions Administered scheduled meds/treatments  Pain Scale 0-10  Pain Score 0  Take Vital Signs  Increase Vital Sign Frequency  Yellow: Q 2hr X 2 then Q 4hr X 2, if remains yellow, continue Q 4hrs  Escalate  MEWS: Escalate Yellow: discuss with charge nurse/RN and consider discussing with provider and RRT  Notify: Charge Nurse/RN  Name of Charge Nurse/RN Notified Iffy RN  Date Charge Nurse/RN Notified 01/17/20  Time Charge Nurse/RN Notified 2020  Document  Patient Outcome Stabilized after interventions  Progress note created (see row info) Yes   - Yellow MEWS guidelines implemented,noted green MEWS on the next set of VS, monitored as per guidelines.

## 2020-01-17 NOTE — Progress Notes (Addendum)
[facility] Progress Note  Patient: Connor [name]    NGE:[id number]  DOA: 01/08/2020     Date of Service: the patient was seen and examined on 01/17/2020  Brief hospital course: 74 year old male with polycythemia vera, BPH, mild COPD, OSA on CPAP at home , strong family history of coronary artery disease, negative stress test 2007, diagnosed with Covid on 9/16. Initially mild symptoms. Went to receive monoclonal antibody infusion on 9/22. After treatment oxygen was 85% so referred to ER. Unvaccinated against COVID-19. Chest x-ray with bilateral multifocal groundglass infiltrates. Remains in the hospital with very high oxygen requirement despite maximum medical therapy. Currently plan is supportive oxygen therapy.  Assessment and Plan: Acute hypoxemic respiratory failure  COVID-19 pneumonia. Acute on chronic HFpEF. CXR: hazy bilateral peripheral opacities CT chest: GGO, consolidation Oxygen requirement: On 15 L HFNC plus NRB in the morning. Per RN in the evening on 12 L HFNC only without NRB. CRP: Currently 1.8 Remdesivir: Completed 5-day treatment course Steroids: Currently on IV Solu-Medrol day 15 of the therapy.  In the setting of now developing thrush we will start tapering on 01/18/2020. Baricitinib/Actemra(off-label use): completed 14-day treatment course. The investigational nature of this medication was discussed with the patient/HCPOA and they choose to proceed as the potential benefits are felt to outweigh risks at this time.  Diuretics.  Currently being treated with IV Lasix 40 mg twice daily.  -3 L in last 24 hours.  -25 L for all hospital stay. Informed patient to drink less fluid he is currently drinking 3 L every day. Antibiotics: Currently not indicated Vitamin C and Zinc: Continue Prone positioning and incentive spirometer use recommended.  Overall plan: Gradual improvement in oxygenation. Continue to monitor for now. Continue aggressive diuresis. Completed  remdesivir at bedside today. Start tapering steroids.  Eventual plan to discharge home with home health based on PT evaluation once oxygenation improves. Might be able to come out of the isolation on 01/18/2020 depending on clinical improvement as well as based on 21 days from initial diagnosis.  The treatment plan and use of medications and known side effects were discussed with patient/family. It was clearly explained that Complete risks and long-term side effects are unknown, however in the best clinical judgment they seem to be of some clinical benefit rather than medical risks. Patient/family agree with the treatment plan and want to receive these treatments as indicated.   Oral thrush. On nystatin.  Essential hypertension:  Blood pressure fairly stable. Cozaar on hold.  Hyperlipidemia:  On Crestor. Continued.  Bilateral DVT:  Initially treated with Lovenox and now on Xarelto.  Continue. CT angio chest on 01/03/2020 - for pulmonary bolus him.  Sleep apnea:  Using BiPAP at night with 100% FiO2. CPAP at home.  Constipation. Currently bowel regimen initiated.  Hyperbilirubinemia. Monitor for now.  Mild hyponatremia. Monitor while patient is receiving diuresis.  BPH. Continue current regimen.  Hyperlipidemia. Continue Crestor.  Vitamin D deficiency history. Family requested to introduce the medication.  Currently started on 01/17/2020.  polycythemia vera-JAK2 negative and chronic immune thrombocytopenia Counts currently stable.  Monitor. Follows up with [name].  GERD. SP Nissen fundoplication in 4010. Currently on as needed PPI.  Sinus tachycardia. Response to hypoxia and pneumonia. Currently monitoring. May require beta-blocker  Obesity Placing the patient at high risk for poor outcome. Body mass index is 31.38 kg/m.    Interventions:        Diet: Cardiac diet  Advance goals of care discussion: Full code  Family  Communication: no family was  present at bedside, at the time of interview.  The pt provided permission to discuss medical plan with the family. Discussed with wife on the phone. She had some questions about Covid plasma infusion, informed that at this spot that therapy has been proven not effective and is not part of the official protocol. She was concern about her options for therapy after completion of remdesivir as well as tapering of the steroids and I informed that therapy with oxygen will be the ongoing mainstay. Opportunity was given to ask question and all questions were answered satisfactorily.   Disposition:  Status is: Inpatient  Remains inpatient appropriate because:IV treatments appropriate due to intensity of illness or inability to take PO   Dispo: The patient is from: Home              Anticipated d/c is to: Home              Anticipated d/c date is: > 3 days              Patient currently is not medically stable to d/c.  Subjective: Continues to have shortness of breath, no nausea and vomiting.  He also feels constipated.  Mild abdominal tenderness still present. Ate 100% of dinner  Physical Exam:  General: Appear in mild distress, no Rash; Oral Mucosa shows Thrush. no Abnormal Neck Mass Or lumps, Conjunctiva normal  Cardiovascular: S1 and S2 Present, no Murmur, Respiratory: increased respiratory effort, Bilateral Air entry present and bilateral  Crackles, no wheezes Abdomen: Bowel Sound present, Soft and no tenderness Extremities: no Pedal edema Neurology: alert and oriented to time, place, and person affect appropriate. no new focal deficit Gait not checked due to patient safety concerns  Vitals:   01/17/20 1133 01/17/20 1630 01/17/20 2010 01/17/20 2014  BP: 120/69 135/78  138/75  Pulse: (!) 119 (!) 120  (!) 116  Resp: 20   (!) 22  Temp: 97.9 F (36.6 C) 97.7 F (36.5 C)  97.9 F (36.6 C)  TempSrc: Oral Oral  Oral  SpO2: (!) 87% (!) 88% (!) 89% (!) 88%  Weight:      Height:         Intake/Output Summary (Last 24 hours) at 01/17/2020 2106 Last data filed at 01/17/2020 2035 Gross per 24 hour  Intake 860 ml  Output 3400 ml  Net -2540 ml   Filed Weights   12/30/2019 1626  Weight: 102.1 kg    Data Reviewed: I have personally reviewed and interpreted daily labs, tele strips, imagings as discussed above. I reviewed all nursing notes, pharmacy notes, vitals, pertinent old records I have discussed plan of care as described above with RN and patient/family.  CBC: Recent Labs  Lab 01/11/20 0458 01/12/20 0419 01/13/20 0341 01/16/20 0517 01/17/20 0443  WBC 13.1* 12.4* 13.3* 21.9* 19.7*  NEUTROABS 12.2* 11.8* 12.5* 20.7* 18.6*  HGB 16.7 16.9 17.2* 17.7* 17.6*  HCT 49.7 51.0 51.4 52.5* 51.9  MCV 88.9 89.5 88.5 88.2 88.0  PLT 209 200 202 221 606   Basic Metabolic Panel: Recent Labs  Lab 01/11/20 0458 01/12/20 0419 01/13/20 0341 01/16/20 0517 01/17/20 0443  NA 137 138 138 134* 133*  K 4.4 4.2 4.2 4.2 4.4  CL 100 98 96* 93* 90*  CO2 24 27 27 29 30   GLUCOSE 185* 192* 191* 164* 176*  BUN 33* 42* 54* 55* 59*  CREATININE 0.97 1.09 0.99 1.03 0.92  CALCIUM 8.5* 9.0 9.2 8.8* 9.1  Studies: No results found.  Scheduled Meds: . alfuzosin  10 mg Oral q morning - 10a  . vitamin C  500 mg Oral Daily  . feeding supplement (GLUCERNA SHAKE)  237 mL Oral TID BM  . ferrous sulfate  325 mg Oral Q breakfast  . furosemide  40 mg Intravenous Q12H  . insulin aspart  0-9 Units Subcutaneous TID WC  . insulin aspart  5 Units Subcutaneous TID WC  . insulin detemir  10 Units Subcutaneous BID  . loratadine  10 mg Oral Daily  . methylPREDNISolone (SOLU-MEDROL) injection  50 mg Intravenous Q12H  . montelukast  10 mg Oral QHS  . nystatin  5 mL Oral QID  . Rivaroxaban  15 mg Oral BID WC   Followed by  . [START ON 01/28/2020] rivaroxaban  20 mg Oral Q supper  . rosuvastatin  10 mg Oral Daily  . senna-docusate  1 tablet Oral BID  . vitamin B-12  500 mcg Oral Daily  .  Vitamin D (Ergocalciferol)  50,000 Units Oral Q7 days  . zinc sulfate  220 mg Oral Daily   Continuous Infusions: . sodium chloride     PRN Meds: sodium chloride, acetaminophen, chlorpheniramine-HYDROcodone, cyclobenzaprine, esomeprazole, guaiFENesin-dextromethorphan, Ipratropium-Albuterol, sodium chloride  Time spent: 35 minutes  Author: Berle Mull, MD [facility] 01/17/2020 9:06 PM  To reach On-call, see care teams to locate the attending and reach out via [email or web address]. Between 7PM-7AM, please contact night-coverage If you still have difficulty reaching the attending provider, please page the [facility] (Director on Call) for [facility] on amion for assistance.

## 2020-01-17 NOTE — Plan of Care (Addendum)

## 2020-01-17 NOTE — Plan of Care (Signed)
Patient continues to require the max on both NonreBreather and HFNC just to maintain adequate oxygen saturation at 83-87%. Patientreports feeling like he is constipated. Stool softeners administered with no results at this time. Report given to [facility]. Problem: Coping: Goal: Psychosocial and spiritual needs will be supported Outcome: Progressing   Problem: Respiratory: Goal: Complications related to the disease process, condition or treatment will be avoided or minimized Outcome: Not Progressing

## 2020-01-18 ENCOUNTER — Inpatient Hospital Stay (HOSPITAL_COMMUNITY): Payer: Medicare Other

## 2020-01-18 DIAGNOSIS — J1282 Pneumonia due to Coronavirus disease 2019: Secondary | ICD-10-CM | POA: Diagnosis not present

## 2020-01-18 DIAGNOSIS — U071 COVID-19: Secondary | ICD-10-CM | POA: Diagnosis not present

## 2020-01-18 LAB — COMPREHENSIVE METABOLIC PANEL
ALT: 40 U/L (ref 0–44)
AST: 23 U/L (ref 15–41)
Albumin: 2.8 g/dL — ABNORMAL LOW (ref 3.5–5.0)
Alkaline Phosphatase: 69 U/L (ref 38–126)
Anion gap: 13 (ref 5–15)
BUN: 57 mg/dL — ABNORMAL HIGH (ref 8–23)
CO2: 28 mmol/L (ref 22–32)
Calcium: 9.1 mg/dL (ref 8.9–10.3)
Chloride: 89 mmol/L — ABNORMAL LOW (ref 98–111)
Creatinine, Ser: 1.04 mg/dL (ref 0.61–1.24)
GFR calc non Af Amer: >60 mL/min (ref >60)
Glucose, Bld: 158 mg/dL — ABNORMAL HIGH (ref 70–99)
Potassium: 4.5 mmol/L (ref 3.5–5.1)
Sodium: 130 mmol/L — ABNORMAL LOW (ref 135–145)
Total Bilirubin: 1.7 mg/dL — ABNORMAL HIGH (ref 0.3–1.2)
Total Protein: 6.3 g/dL — ABNORMAL LOW (ref 6.5–8.1)

## 2020-01-18 LAB — CBC WITH DIFFERENTIAL/PLATELET
Abs Immature Granulocytes: 0.27 10*3/uL — ABNORMAL HIGH (ref 0.00–0.07)
Basophils Absolute: 0.1 10*3/uL (ref 0.0–0.1)
Basophils Relative: 0 %
Eosinophils Absolute: 0 10*3/uL (ref 0.0–0.5)
Eosinophils Relative: 0 %
HCT: 53.4 % — ABNORMAL HIGH (ref 39.0–52.0)
Hemoglobin: 18.2 g/dL — ABNORMAL HIGH (ref 13.0–17.0)
Immature Granulocytes: 1 %
Lymphocytes Relative: 2 %
Lymphs Abs: 0.4 10*3/uL — ABNORMAL LOW (ref 0.7–4.0)
MCH: 29.8 pg (ref 26.0–34.0)
MCHC: 34.1 g/dL (ref 30.0–36.0)
MCV: 87.5 fL (ref 80.0–100.0)
Monocytes Absolute: 1 10*3/uL (ref 0.1–1.0)
Monocytes Relative: 4 %
Neutro Abs: 22.9 10*3/uL — ABNORMAL HIGH (ref 1.7–7.7)
Neutrophils Relative %: 93 %
Platelets: 221 10*3/uL (ref 150–400)
RBC: 6.1 MIL/uL — ABNORMAL HIGH (ref 4.22–5.81)
RDW: 11.8 % (ref 11.5–15.5)
WBC: 24.7 10*3/uL — ABNORMAL HIGH (ref 4.0–10.5)
nRBC: 0 % (ref 0.0–0.2)

## 2020-01-18 LAB — C-REACTIVE PROTEIN: CRP: 0.7 mg/dL (ref <1.0)

## 2020-01-18 LAB — D-DIMER, QUANTITATIVE: D-Dimer, Quant: 0.82 ug/mL-FEU — ABNORMAL HIGH (ref 0.00–0.50)

## 2020-01-18 LAB — GLUCOSE, CAPILLARY
Glucose-Capillary: 164 mg/dL — ABNORMAL HIGH (ref 70–99)
Glucose-Capillary: 285 mg/dL — ABNORMAL HIGH (ref 70–99)
Glucose-Capillary: 160 mg/dL — ABNORMAL HIGH (ref 70–99)
Glucose-Capillary: 118 mg/dL — ABNORMAL HIGH (ref 70–99)

## 2020-01-18 LAB — MAGNESIUM: Magnesium: 2.7 mg/dL — ABNORMAL HIGH (ref 1.7–2.4)

## 2020-01-18 LAB — RESPIRATORY PANEL BY RT PCR (FLU A&B, COVID)
Influenza A by PCR: NEGATIVE
Influenza B by PCR: NEGATIVE
SARS Coronavirus 2 by RT PCR: POSITIVE — AB

## 2020-01-18 MED ORDER — MAGNESIUM HYDROXIDE 400 MG/5ML PO SUSP: 15.0000 mL | Freq: Every day | ORAL | Status: DC | PRN

## 2020-01-18 MED ORDER — RISAQUAD PO CAPS
1.0000 | ORAL_CAPSULE | Freq: Every day | ORAL | Status: DC
  Administered 2020-01-19 – 2020-01-22 (×3): 1 via ORAL
  Filled 2020-01-18 (×4): qty 1

## 2020-01-18 MED ORDER — PANTOPRAZOLE SODIUM 40 MG PO TBEC
40.0000 mg | DELAYED_RELEASE_TABLET | Freq: Every day | ORAL | Status: DC
  Administered 2020-01-19: 40 mg via ORAL
  Filled 2020-01-18 (×3): qty 1

## 2020-01-18 MED ORDER — BISACODYL 10 MG RE SUPP: 10.0000 mg | Freq: Every day | RECTAL | Status: DC | PRN

## 2020-01-18 MED ORDER — LACTULOSE 10 GM/15ML PO SOLN
20.0000 g | Freq: Two times a day (BID) | ORAL | Status: DC
  Administered 2020-01-18 – 2020-01-26 (×6): 20 g via ORAL
  Filled 2020-01-18 (×8): qty 30

## 2020-01-18 MED ORDER — METOPROLOL TARTRATE 25 MG PO TABS
12.5000 mg | ORAL_TABLET | Freq: Two times a day (BID) | ORAL | Status: DC
  Administered 2020-01-18 – 2020-01-22 (×8): 12.5 mg via ORAL
  Filled 2020-01-18 (×10): qty 1

## 2020-01-18 MED ORDER — METOPROLOL TARTRATE 25 MG PO TABS: 25.0000 mg | ORAL_TABLET | Freq: Two times a day (BID) | ORAL | Status: DC

## 2020-01-18 MED ORDER — METHYLPREDNISOLONE SODIUM SUCC 40 MG IJ SOLR
40.0000 mg | Freq: Two times a day (BID) | INTRAMUSCULAR | Status: DC
  Administered 2020-01-18 – 2020-01-26 (×16): 40 mg via INTRAVENOUS
  Filled 2020-01-18 (×15): qty 1

## 2020-01-18 NOTE — Progress Notes (Signed)
Message the provider of the covid test resulted 28.3.

## 2020-01-18 NOTE — Progress Notes (Signed)
Occupational Therapy Treatment Patient Details Name: Connor [name] MRN: [id number] DOB: 11-04-1945 Today's Date: 01/18/2020    History of present illness 74 y.o. male with medical history significant of Polycythemia Vera, BPH, mild COPD, carotid artery disease was diagnosed as COVID positive on 12/27/19. He was scheduled for and did receive monoclonal antibody infusion 12/19/2019. After treatment his O2 sat was 85%. He was referred to [facility] on 9/22 for evaluation and admission. He is unvaccinated. CXR with mild multifocal ground glass infiltrates. Currently on 40 L HHFNC and NRB.   OT comments  Treatment focused on exercises in the chair to improve overall strength and activity tolerance. Patient's o2 sat 88% on 40 L HHFNC and NRB when therapist entered the room. Patient performed 10 shoulder retractions with increased time due to persistent coughing spells with chest expansion. Patient performed hip hikes and shoulder flexion reps. Patient's o2 sats between 82-85% with minimal activity. Patient appeared fatigued, clammy, cold and sweaty. Patient instructed on diaphragmatic breathing and patient able to perform x 5 though once again limited by coughing spells. Patient's overall exhibits a decline in functional abilities, increased oxygenation needs and activity tolerance compared to evaluation - goal dates and DC planning changed to reflect patient's current status. Patient may require short term rehab at discharge or [facility] OT depending on progress while in hospital.   Follow Up Recommendations  Home health OT;SNF    Equipment Recommendations  Other (comment) (TBD)    Recommendations for Other Services      Precautions / Restrictions Precautions Precautions: Fall Precaution Comments: monitor vitals, on 40L HHFNC and NRB Restrictions Weight Bearing Restrictions: No       Mobility Bed Mobility               General bed mobility comments: up in recliner  Transfers                       Balance                                           ADL either performed or assessed with clinical judgement   ADL                                               Vision       Perception     Praxis      Cognition Arousal/Alertness: Awake/alert Behavior During Therapy: WFL for tasks assessed/performed Overall Cognitive Status: Within Functional Limits for tasks assessed                                          Exercises Other Exercises Other Exercises: Shoulder Retractions x 10, Hip Hikes x 10 each arm, Shoulder Flexion x 10 each arm.   Shoulder Instructions       General Comments      Pertinent Vitals/ Pain       Pain Assessment: No/denies pain  Home Living  Prior Functioning/Environment              Frequency  Min 2X/week        Progress Toward Goals  OT Goals(current goals can now be found in the care plan section)  Progress towards OT goals: Not progressing toward goals - comment (Oxygenation needs higher, decreased activity tolerance)  Acute Rehab OT Goals Time For Goal Achievement: 02/01/20 Potential to Achieve Goals: Good  Plan Discharge plan needs to be updated    Co-evaluation          OT goals addressed during session: Strengthening/ROM (activity tolerance)      AM-PAC OT "6 Clicks" Daily Activity     Outcome Measure   Help from another person eating meals?: None Help from another person taking care of personal grooming?: A Little Help from another person toileting, which includes using toliet, bedpan, or urinal?: A Lot Help from another person bathing (including washing, rinsing, drying)?: A Lot Help from another person to put on and taking off regular upper body clothing?: A Little Help from another person to put on and taking off regular lower body clothing?: A Lot 6 Click Score: 16    End of Session  Equipment Utilized During Treatment: Oxygen  OT Visit Diagnosis: Unsteadiness on feet (R26.81);Muscle weakness (generalized) (M62.81)   Activity Tolerance Patient limited by fatigue   Patient Left in chair;with call bell/phone within reach;with chair alarm set   Nurse Communication Mobility status        Time: 3335-4562 OT Time Calculation (min): 20 min  Charges: OT General Charges $OT Visit: 1 Visit OT Treatments $Therapeutic Exercise: 8-22 mins  Derl Barrow, OTR/L [place]  Office [phone] Pager: [place] 01/18/2020, 4:53 PM

## 2020-01-18 NOTE — Progress Notes (Addendum)
[facility] Progress Note  Patient: Connor [name]    BDZ:[id number]  DOA: 12/30/2019     Date of Service: the patient was seen and examined on 01/18/2020  Brief hospital course: 74 year old male with polycythemia vera, BPH, mild COPD, OSA on CPAP at home , strong family history of coronary artery disease, negative stress test 2007, diagnosed with Covid on 9/16. Initially mild symptoms. Went to receive monoclonal antibody infusion on 9/22. After treatment oxygen was 85% so referred to ER. Unvaccinated against COVID-19. Chest x-ray with bilateral multifocal groundglass infiltrates. Remains in the hospital with very high oxygen requirement despite maximum medical therapy. Currently plan is supportive oxygen therapy.  Assessment and Plan: Acute hypoxemic respiratory failure  COVID-19 pneumonia. Acute on chronic HFpEF. CXR: hazy bilateral peripheral opacities CT chest: GGO, consolidation Initial positive Covid test 12/27/2019 Repeat positive Covid test on 01/28/2020 with a CT value of 30.3. Oxygen requirement:  15 L HFNC and 15 L NRB.  Previous note mention erroneously about patient on 12 L HFNC only. Currently transition to heated high flow at 40 L and 100% FiO2. CRP: Currently 0.7 Remdesivir: Completed 5-day treatment course Steroids: Currently on IV Solu-Medrol Started on 01/10/2020.  Starting to taper. Baricitinib/Actemra(off-label use): completed 14-day treatment course on 01/17/2020. The investigational nature of this medication was discussed with the patient/HCPOA and they choose to proceed as the potential benefits are felt to outweigh risks at this time.  Diuretics.  Currently being treated with IV Lasix 40 mg twice daily. Antibiotics: Currently not indicated Vitamin C and Zinc: Continue Prone positioning and incentive spirometer use recommended.  Overall plan: Gradual improvement in oxygenation. Continue to monitor for now. Continue aggressive diuresis. Start tapering  steroids.   Unfortunately, will maintain isolation given CT value still significantly low. Eventual plan to discharge home with home health based on PT evaluation once oxygenation improves.  The treatment plan and use of medications and known side effects were discussed with patient/family. It was clearly explained that Complete risks and long-term side effects are unknown, however in the best clinical judgment they seem to be of some clinical benefit rather than medical risks. Patient/family agree with the treatment plan and want to receive these treatments as indicated.   Oral thrush. On nystatin.  Essential hypertension: Blood pressure fairly stable. Cozaar on hold.  Hyperlipidemia:  On Crestor. Continued.  Bilateral DVT: Initially treated with Lovenox and now on Xarelto. Continue. CT angio chest on 01/03/2020 negative for pulmonary embolism  Sleep apnea: Using BiPAP at night with 100% FiO2. CPAP at home.  Constipation. Currently bowel regimen initiated. Still remains constipated but passing gas. Adding lactulose to the regimen. X-ray abdomen on 01/18/2020 shows stool burden primarily in the ascending colon again.  Hyperbilirubinemia. Monitor for now.  Mild hyponatremia. Monitor while patient is receiving diuresis.  BPH. Continue current regimen.  Hyperlipidemia. Continue Crestor.  Vitamin D deficiency history. Family requested to introduce the medication.  Currently started on 01/17/2020.  polycythemia vera-JAK2 negative and chronic immune thrombocytopenia Counts currently stable.  Monitor. Follows up with [name].  GERD. SP Nissen fundoplication in 3419. Currently on as needed PPI.  Sinus tachycardia. Response to hypoxia and pneumonia. Currently monitoring. May require beta-blocker  Obesity Placing the patient at high risk for poor outcome.  Diet: Cardiac diet DVT Prophylaxis:   Place and maintain sequential compression device  Start: 01/07/20 1315 Rivaroxaban (XARELTO) tablet 15 mg  rivaroxaban (XARELTO) tablet 20 mg    Advance goals of care discussion: Full code  Family Communication: no family was present at bedside, at the time of interview.  The pt provided permission to discuss medical plan with the family. Opportunity was given to ask question and all questions were answered satisfactorily.   Disposition:  Status is: Inpatient  Remains inpatient appropriate because:IV treatments appropriate due to intensity of illness or inability to take PO   Dispo: The patient is from: Home              Anticipated d/c is to: Home              Anticipated d/c date is: > 3 days              Patient currently is not medically stable to d/c.   Subjective: continue to report abdominal pain and constipation, no nausea and vomiting. No chest pain. Still has shortness of breath   Physical Exam:  General: Appear in moderate distress, no Rash; Oral Mucosa shows thrush. no Abnormal Neck Mass Or lumps, Conjunctiva normal  Cardiovascular: S1 and S2 Present, no Murmur, Respiratory: increased respiratory effort, Bilateral Air entry present and bilateral  Crackles, no wheezes Abdomen: Bowel Sound present, Soft and mild diffuse tenderness Extremities: no Pedal edema Neurology: alert and oriented to time, place, and person affect appropriate. no new focal deficit Gait not checked due to patient safety concerns  Vitals:   01/18/20 1139 01/18/20 1200 01/18/20 1450 01/18/20 1536  BP: (!) 123/57 (!) 125/92  113/61  Pulse: (!) 122 98 (!) 117 (!) 108  Resp: 20 20 (!) 30   Temp: (!) 96.9 F (36.1 C) 97.8 F (36.6 C)  (!) 96.9 F (36.1 C)  TempSrc:  Oral  Oral  SpO2: (!) 89% (!) 88% 90% 92%  Weight:      Height:        Intake/Output Summary (Last 24 hours) at 01/18/2020 1837 Last data filed at 01/18/2020 0451 Gross per 24 hour  Intake 960 ml  Output 1850 ml  Net -890 ml   Filed Weights   12/26/2019 1626  Weight: 102.1  kg    Data Reviewed: I have personally reviewed and interpreted daily labs, tele strips, imagings as discussed above. I reviewed all nursing notes, pharmacy notes, vitals, pertinent old records I have discussed plan of care as described above with RN and patient/family.  CBC: Recent Labs  Lab 01/12/20 0419 01/13/20 0341 01/16/20 0517 01/17/20 0443 01/18/20 0429  WBC 12.4* 13.3* 21.9* 19.7* 24.7*  NEUTROABS 11.8* 12.5* 20.7* 18.6* 22.9*  HGB 16.9 17.2* 17.7* 17.6* 18.2*  HCT 51.0 51.4 52.5* 51.9 53.4*  MCV 89.5 88.5 88.2 88.0 87.5  PLT 200 202 221 232 742   Basic Metabolic Panel: Recent Labs  Lab 01/12/20 0419 01/13/20 0341 01/16/20 0517 01/17/20 0443 01/18/20 0429  NA 138 138 134* 133* 130*  K 4.2 4.2 4.2 4.4 4.5  CL 98 96* 93* 90* 89*  CO2 27 27 29 30 28   GLUCOSE 192* 191* 164* 176* 158*  BUN 42* 54* 55* 59* 57*  CREATININE 1.09 0.99 1.03 0.92 1.04  CALCIUM 9.0 9.2 8.8* 9.1 9.1  MG  --   --   --   --  2.7*    Studies: DG CHEST PORT 1 VIEW  Result Date: 01/18/2020 CLINICAL DATA:  Coughing and shortness of breath EXAM: PORTABLE CHEST 1 VIEW COMPARISON:  01/08/2020 FINDINGS: Persistent bilateral opacities. No significant pleural effusion. No pneumothorax. Stable cardiomediastinal contours. IMPRESSION: Persistent bilateral pulmonary opacities similar to prior study. Electronically  Signed   By: Macy Mis [name]   On: 01/18/2020 09:31   DG Abd Portable 1V  Result Date: 01/18/2020 CLINICAL DATA:  Constipation, generalized abdominal pain EXAM: PORTABLE ABDOMEN - 1 VIEW COMPARISON:  01/16/2020 FINDINGS: The bowel gas pattern is normal. No radio-opaque calculi or other significant radiographic abnormality are seen. Mild volume of stool projects throughout the colon, not substantially increased from prior. IMPRESSION: Nonobstructive bowel gas pattern. Mild volume of stool throughout the colon. Electronically Signed   By: Davina Poke D.O.   On: 01/18/2020 09:34     Scheduled Meds: . acidophilus  1 capsule Oral Daily  . alfuzosin  10 mg Oral q morning - 10a  . vitamin C  500 mg Oral Daily  . feeding supplement (GLUCERNA SHAKE)  237 mL Oral TID BM  . ferrous sulfate  325 mg Oral Q breakfast  . furosemide  40 mg Intravenous Q12H  . insulin aspart  0-9 Units Subcutaneous TID WC  . insulin aspart  5 Units Subcutaneous TID WC  . insulin detemir  10 Units Subcutaneous BID  . lactulose  20 g Oral BID  . loratadine  10 mg Oral Daily  . methylPREDNISolone (SOLU-MEDROL) injection  40 mg Intravenous Q12H  . metoprolol tartrate  12.5 mg Oral BID  . montelukast  10 mg Oral QHS  . nystatin  5 mL Oral QID  . [START ON 01/19/2020] pantoprazole  40 mg Oral Daily  . Rivaroxaban  15 mg Oral BID WC   Followed by  . [START ON 01/28/2020] rivaroxaban  20 mg Oral Q supper  . rosuvastatin  10 mg Oral Daily  . senna-docusate  1 tablet Oral BID  . vitamin B-12  500 mcg Oral Daily  . Vitamin D (Ergocalciferol)  50,000 Units Oral Q7 days  . zinc sulfate  220 mg Oral Daily   Continuous Infusions: . sodium chloride     PRN Meds: sodium chloride, acetaminophen, bisacodyl, chlorpheniramine-HYDROcodone, cyclobenzaprine, guaiFENesin-dextromethorphan, Ipratropium-Albuterol, magnesium hydroxide, sodium chloride  Time spent: 35 minutes  Author: Berle Mull, MD [facility] 01/18/2020 6:37 PM  To reach On-call, see care teams to locate the attending and reach out via [email or web address]. Between 7PM-7AM, please contact night-coverage If you still have difficulty reaching the attending provider, please page the [facility] (Director on Call) for [facility] on amion for assistance.

## 2020-01-18 NOTE — TOC Progression Note (Signed)
Transition of Care [facility]) - Progression Note    Patient Details  Name: Connor [name] MRN: [id number] Date of Birth: 01/28/1946  [facility]) CM/SW Contact  Purcell Mouton, RN Phone Number: 01/18/2020, 10:44 AM  Clinical Narrative:    Referral faxed to [facility].         Expected Discharge Plan and Services                                                 Social Determinants of Health (SDOH) Interventions    Readmission Risk Interventions No flowsheet data found.

## 2020-01-18 NOTE — Progress Notes (Signed)
RT NOTE:  Pt transitioned from HFNC (salter) with NRB to a [place] at [place] and 100%. Pt able to tolerate this well at this time with saturations of 88%. RT informed pt if he feels short of breath to put his NRB back on, RT left NRB right next to pt. Vitals stable at this time, RT will continue to monitor.

## 2020-01-19 ENCOUNTER — Inpatient Hospital Stay (HOSPITAL_COMMUNITY): Payer: Medicare Other

## 2020-01-19 DIAGNOSIS — U071 COVID-19: Secondary | ICD-10-CM | POA: Diagnosis not present

## 2020-01-19 DIAGNOSIS — J1282 Pneumonia due to Coronavirus disease 2019: Secondary | ICD-10-CM | POA: Diagnosis not present

## 2020-01-19 LAB — COMPREHENSIVE METABOLIC PANEL
ALT: 31 U/L (ref 0–44)
AST: 30 U/L (ref 15–41)
Albumin: 2.8 g/dL — ABNORMAL LOW (ref 3.5–5.0)
Alkaline Phosphatase: 66 U/L (ref 38–126)
Anion gap: 16 — ABNORMAL HIGH (ref 5–15)
BUN: 68 mg/dL — ABNORMAL HIGH (ref 8–23)
CO2: 25 mmol/L (ref 22–32)
Calcium: 8.5 mg/dL — ABNORMAL LOW (ref 8.9–10.3)
Chloride: 88 mmol/L — ABNORMAL LOW (ref 98–111)
Creatinine, Ser: 1.27 mg/dL — ABNORMAL HIGH (ref 0.61–1.24)
GFR, Estimated: 55 mL/min — ABNORMAL LOW (ref >60)
Glucose, Bld: 169 mg/dL — ABNORMAL HIGH (ref 70–99)
Potassium: 4.5 mmol/L (ref 3.5–5.1)
Sodium: 129 mmol/L — ABNORMAL LOW (ref 135–145)
Total Bilirubin: 1.7 mg/dL — ABNORMAL HIGH (ref 0.3–1.2)
Total Protein: 5.9 g/dL — ABNORMAL LOW (ref 6.5–8.1)

## 2020-01-19 LAB — CBC WITH DIFFERENTIAL/PLATELET
Abs Immature Granulocytes: 0.79 10*3/uL — ABNORMAL HIGH (ref 0.00–0.07)
Basophils Absolute: 0.1 10*3/uL (ref 0.0–0.1)
Basophils Relative: 0 %
Eosinophils Absolute: 0 10*3/uL (ref 0.0–0.5)
Eosinophils Relative: 0 %
HCT: 52.4 % — ABNORMAL HIGH (ref 39.0–52.0)
Hemoglobin: 17.7 g/dL — ABNORMAL HIGH (ref 13.0–17.0)
Immature Granulocytes: 2 %
Lymphocytes Relative: 1 %
Lymphs Abs: 0.4 10*3/uL — ABNORMAL LOW (ref 0.7–4.0)
MCH: 29.5 pg (ref 26.0–34.0)
MCHC: 33.8 g/dL (ref 30.0–36.0)
MCV: 87.3 fL (ref 80.0–100.0)
Monocytes Absolute: 1.4 10*3/uL — ABNORMAL HIGH (ref 0.1–1.0)
Monocytes Relative: 4 %
Neutro Abs: 35.8 10*3/uL — ABNORMAL HIGH (ref 1.7–7.7)
Neutrophils Relative %: 93 %
Platelets: 252 10*3/uL (ref 150–400)
RBC: 6 MIL/uL — ABNORMAL HIGH (ref 4.22–5.81)
RDW: 11.9 % (ref 11.5–15.5)
WBC: 38.5 10*3/uL — ABNORMAL HIGH (ref 4.0–10.5)
nRBC: 0 % (ref 0.0–0.2)

## 2020-01-19 LAB — GLUCOSE, CAPILLARY
Glucose-Capillary: 116 mg/dL — ABNORMAL HIGH (ref 70–99)
Glucose-Capillary: 211 mg/dL — ABNORMAL HIGH (ref 70–99)
Glucose-Capillary: 116 mg/dL — ABNORMAL HIGH (ref 70–99)
Glucose-Capillary: 167 mg/dL — ABNORMAL HIGH (ref 70–99)

## 2020-01-19 LAB — MAGNESIUM: Magnesium: 2.6 mg/dL — ABNORMAL HIGH (ref 1.7–2.4)

## 2020-01-19 LAB — D-DIMER, QUANTITATIVE: D-Dimer, Quant: 0.91 ug/mL-FEU — ABNORMAL HIGH (ref 0.00–0.50)

## 2020-01-19 LAB — C-REACTIVE PROTEIN: CRP: 0.8 mg/dL (ref <1.0)

## 2020-01-19 MED ORDER — MELATONIN 3 MG PO TABS
3.0000 mg | ORAL_TABLET | Freq: Every day | ORAL | Status: DC
  Administered 2020-01-19 – 2020-01-21 (×2): 3 mg via ORAL
  Filled 2020-01-19 (×2): qty 1

## 2020-01-19 MED ORDER — IOHEXOL 350 MG/ML SOLN
100.0000 mL | Freq: Once | INTRAVENOUS | Status: AC | PRN
  Administered 2020-01-19: 100 mL via INTRAVENOUS

## 2020-01-19 MED ORDER — BISACODYL 10 MG RE SUPP
10.0000 mg | Freq: Once | RECTAL | Status: AC
  Administered 2020-01-19: 10 mg via RECTAL
  Filled 2020-01-19: qty 1

## 2020-01-19 NOTE — Progress Notes (Addendum)
[facility] Progress Note  Patient: Connor [name]    HQP:[id number]  DOA: 12/24/2019     Date of Service: the patient was seen and examined on 01/19/2020  Brief hospital course: 74 year old male with polycythemia vera, BPH, mild COPD, OSA on CPAP at home , strong family history of coronary artery disease, negative stress test 2007, diagnosed with Covid on 9/16. Initially mild symptoms. Went to receive monoclonal antibody infusion on 9/22. After treatment oxygen was 85% so referred to ER. Unvaccinated against COVID-19. Chest x-ray with bilateral multifocal groundglass infiltrates. Remains in the hospital with very high oxygen requirement despite maximum medical therapy. 10/9 pneumomediastinum on CT scan.  BiPAP discontinued.  Incentive spirometer discontinued.  In and out catheterization x1. Currently plan is supportive oxygen therapy.  Assessment and Plan: Acute hypoxemic respiratory failure  COVID-19 pneumonia. Acute on chronic HFpEF. Complicated by pneumomediastinum seen on 01/19/2020. CXR: hazy bilateral peripheral opacities CT chest: GGO, consolidation Initial positive Covid test 12/27/2019 Oxygen requirement:  Heated high flow at 100% FiO2. CRP: Currently 0.7 Remdesivir: Completed 5-day treatment course Steroids: Currently on IV Solu-Medrol Started on 12/27/2019.  Starting to taper. Baricitinib/Actemra(off-label use): completed 14-day treatment course on 01/17/2020. The investigational nature of this medication was discussed with the patient/HCPOA and they choose to proceed as the potential benefits are felt to outweigh risks at this time.  Diuretics.    Aggressively treated with IV Lasix 40 mg twice daily. Currently on hold due to renal function elevation.[name] Antibiotics: Currently not indicated Vitamin C and Zinc: Continue Pneumomediastinum: Discussed with PCCM.  Currently recommending to hold off on BiPAP as well as incentive spirometry.  No further intervention needed for  now. Isolation: CT value 30.3.  On 01/18/2020.  May repeat another test on 01/23/2020 versus transitioning out of the isolation based on clinical examination on 01/24/2020.  Overall plan: Continue supportive care for hypoxia.  Should the patient worsens will require CCM consultation and transfer to stepdown.   The treatment plan and use of medications and known side effects were discussed with patient/family. It was clearly explained that Complete risks and long-term side effects are unknown, however in the best clinical judgment they seem to be of some clinical benefit rather than medical risks. Patient/family agree with the treatment plan and want to receive these treatments as indicated.   Oral thrush. On nystatin.  Essential hypertension: Blood pressure fairly stable. Cozaar on hold.  Hyperlipidemia:  On Crestor. Continued.  Bilateral DVT: Initially treated with Lovenox and now on Xarelto. Continue. CT angio chest on 01/03/2020 negative for pulmonary embolism  Sleep apnea: Using BiPAP at night with 100% FiO2. CPAP at home. Currently PAP therapy on hold.  Constipation. Currently bowel regimen initiated. Still remains constipated but passing gas. Adding lactulose to the regimen. X-ray abdomen on 01/18/2020 shows stool burden primarily in the ascending colon again. CT abdomen 01/19/2020 shows rectal stool burden.  Improved with Dulcolax suppository. No evidence of intra-abdominal pathology.  Hyperbilirubinemia. Monitor for now.  Mild hyponatremia. Currently diuresis on hold.  Will monitor.  BPH. Continue current regimen. In and out catheterization x1 on 01/19/2020.  Hyperlipidemia. Continue Crestor.  Vitamin D deficiency history. Resume on 01/17/2020 per family request  polycythemia vera-JAK2 negative and chronic immune thrombocytopenia Counts currently stable.  Monitor. Follows up with [name]. Hemoglobin currently stable.  GERD. SP Nissen  fundoplication in 5993. Currently on as needed PPI.  Sinus tachycardia. Response to hypoxia and pneumonia and pneumomediastinum.  Obesity Placing the patient at high risk for  poor outcome. Body mass index is 31.38 kg/m.   Leukocytosis. Likely stress reaction or pneumomediastinum. Low threshold to initiate antibiotics with the patient started having fever.  Diet: Cardiac diet DVT Prophylaxis:   Place and maintain sequential compression device Start: 01/07/20 1315 Rivaroxaban (XARELTO) tablet 15 mg  rivaroxaban (XARELTO) tablet 20 mg    Advance goals of care discussion: Full code  Family Communication: no family was present at bedside, at the time of interview.  The pt provided permission to discuss medical plan with the family. Opportunity was given to ask question and all questions were answered satisfactorily.   Disposition:  Status is: Inpatient  Remains inpatient appropriate because:IV treatments appropriate due to intensity of illness or inability to take PO   Dispo: The patient is from: Home              Anticipated d/c is to: Home              Anticipated d/c date is: > 3 days              Patient currently is not medically stable to d/c.   Subjective: No nausea no vomiting.  No fever no chills.  No chest pain.  Reports abdominal pain and abdominal discomfort.  Currently on combination heated high flow as well as NRB.  Physical Exam:  General: Appear in moderate distress, no Rash; Oral Mucosa Clear, moist. no Abnormal Neck Mass Or lumps, Conjunctiva normal  Cardiovascular: S1 and S2 Present, no Murmur, Respiratory: increased respiratory effort, Bilateral Air entry present and bilateral  Crackles, no wheezes Abdomen: Bowel Sound present, Soft and mild tenderness Extremities: no Pedal edema Neurology: alert and oriented to time, place, and person affect appropriate. no new focal deficit Gait not checked dueto patient safety concerns  Vitals:   01/19/20 0719  01/19/20 0900 01/19/20 1142 01/19/20 1335  BP:  129/70 122/75   Pulse:  (!) 117 (!) 108 (!) 108  Resp:   14 (!) 23  Temp:   98.1 F (36.7 C)   TempSrc:   Oral   SpO2: 92%  90% 90%  Weight:      Height:        Intake/Output Summary (Last 24 hours) at 01/19/2020 2023 Last data filed at 01/19/2020 1900 Gross per 24 hour  Intake 480 ml  Output 2526 ml  Net -2046 ml   Filed Weights   12/18/2019 1626  Weight: 102.1 kg    Data Reviewed: I have personally reviewed and interpreted daily labs, tele strips, imagings as discussed above. I reviewed all nursing notes, pharmacy notes, vitals, pertinent old records I have discussed plan of care as described above with RN and patient/family.  CBC: Recent Labs  Lab 01/13/20 0341 01/16/20 0517 01/17/20 0443 01/18/20 0429 01/19/20 0932  WBC 13.3* 21.9* 19.7* 24.7* 38.5*  NEUTROABS 12.5* 20.7* 18.6* 22.9* 35.8*  HGB 17.2* 17.7* 17.6* 18.2* 17.7*  HCT 51.4 52.5* 51.9 53.4* 52.4*  MCV 88.5 88.2 88.0 87.5 87.3  PLT 202 221 232 221 761   Basic Metabolic Panel: Recent Labs  Lab 01/13/20 0341 01/16/20 0517 01/17/20 0443 01/18/20 0429 01/19/20 0544 01/19/20 0920  NA 138 134* 133* 130*  --  129*  K 4.2 4.2 4.4 4.5  --  4.5  CL 96* 93* 90* 89*  --  88*  CO2 27 29 30 28   --  25  GLUCOSE 191* 164* 176* 158*  --  169*  BUN 54* 55* 59* 57*  --  68*  CREATININE 0.99 1.03 0.92 1.04  --  1.27*  CALCIUM 9.2 8.8* 9.1 9.1  --  8.5*  MG  --   --   --  2.7* 2.6*  --     Studies: CT ANGIO CHEST PE W OR WO CONTRAST  Result Date: 01/19/2020 CLINICAL DATA:  74 year old male with positive COVID-19. Concern for pulmonary embolism. Abdominal pain. EXAM: CT ANGIOGRAPHY CHEST CT ABDOMEN AND PELVIS WITH CONTRAST TECHNIQUE: Multidetector CT imaging of the chest was performed using the standard protocol during bolus administration of intravenous contrast. Multiplanar CT image reconstructions and MIPs were obtained to evaluate the vascular anatomy.  Multidetector CT imaging of the abdomen and pelvis was performed using the standard protocol during bolus administration of intravenous contrast. CONTRAST:  174mL OMNIPAQUE IOHEXOL 350 MG/ML SOLN COMPARISON:  Chest CT dated 01/03/2020. FINDINGS: Evaluation of this exam is limited due to respiratory motion artifact. CTA CHEST FINDINGS Cardiovascular: There is no cardiomegaly or pericardial effusion. The thoracic aorta is unremarkable. Evaluation of the pulmonary arteries is limited due to respiratory motion artifact. There is a linear nonocclusive intravascular filling defect in the right upper lobe (165/3). It is indeterminate whether this is in a pulmonary artery branch or a branch of pulmonary vein. However, this appears to be involved in the pulmonary vein and likely related to motion or volume averaging artifact with adjacent structure. A pulmonary embolism is much less likely. No other large or central pulmonary artery embolus identified. Mediastinum/Nodes: There is no hilar or mediastinal adenopathy. The esophagus is grossly unremarkable. No mediastinal fluid collection. There is moderate pneumomediastinum extending to the neck, new since the prior CT. Lungs/Pleura: Bilateral streaky and hazy airspace opacities with predominant involvement of the subpleural lung bases in keeping with COVID-19. Overall improved aeration of the lungs compared to prior CT. No pleural effusion or pneumothorax. The central airways remain patent. Musculoskeletal: Degenerative changes of the spine. No acute osseous pathology. Review of the MIP images confirms the above findings. CT ABDOMEN and PELVIS FINDINGS No intra-abdominal free air or free fluid. Hepatobiliary: Several subcentimeter hepatic hypodense foci are too small to characterize. The liver is otherwise unremarkable. No intrahepatic biliary ductal dilatation. The gallbladder is unremarkable. Pancreas: Unremarkable. No pancreatic ductal dilatation or surrounding inflammatory  changes. Spleen: Normal in size without focal abnormality. Adrenals/Urinary Tract: The adrenal glands unremarkable. There is no hydronephrosis on either side. There is symmetric enhancement and excretion of contrast by both kidneys. There is a 1 cm left renal interpolar cyst. Additional smaller hypodense lesions in the kidneys are too small to characterize. The visualized ureters and urinary bladder appear unremarkable. Stomach/Bowel: There is moderate amount of stool throughout the colon and within the rectal vault. There is sigmoid diverticulosis without active inflammatory changes. There is no bowel obstruction or active inflammation. Appendectomy. Vascular/Lymphatic: Mild aortoiliac atherosclerotic disease. The IVC is unremarkable. No portal venous gas. There is no adenopathy. Reproductive: The prostate and seminal vesicles are grossly unremarkable. Other: None Musculoskeletal: No acute or significant osseous findings. Review of the MIP images confirms the above findings. IMPRESSION: 1. Moderate pneumomediastinum, new since the prior CT. 2. Apparent linear nonocclusive intravascular filling defect in the right upper lobe, likely artifactual and associated with pulmonary venous branch. No large or central pulmonary artery embolus identified. 3. Bilateral streaky and hazy airspace opacities with predominant involvement of the subpleural lung bases in keeping with COVID-19. Overall improved aeration of the lungs compared to prior CT. 4. Sigmoid diverticulosis. 5. Constipation no bowel obstruction. 6.  Aortic Atherosclerosis (ICD10-I70.0). These results were called by telephone at the time of interpretation on 01/19/2020 at 4:23 pm to provider [facility] Waunita Sandstrom , who verbally acknowledged these results. Electronically Signed   By: Anner Crete [name]   On: 01/19/2020 16:22   CT ABDOMEN PELVIS W CONTRAST  Result Date: 01/19/2020 CLINICAL DATA:  74 year old male with positive COVID-19. Concern for pulmonary embolism.  Abdominal pain. EXAM: CT ANGIOGRAPHY CHEST CT ABDOMEN AND PELVIS WITH CONTRAST TECHNIQUE: Multidetector CT imaging of the chest was performed using the standard protocol during bolus administration of intravenous contrast. Multiplanar CT image reconstructions and MIPs were obtained to evaluate the vascular anatomy. Multidetector CT imaging of the abdomen and pelvis was performed using the standard protocol during bolus administration of intravenous contrast. CONTRAST:  15mL OMNIPAQUE IOHEXOL 350 MG/ML SOLN COMPARISON:  Chest CT dated 01/03/2020. FINDINGS: Evaluation of this exam is limited due to respiratory motion artifact. CTA CHEST FINDINGS Cardiovascular: There is no cardiomegaly or pericardial effusion. The thoracic aorta is unremarkable. Evaluation of the pulmonary arteries is limited due to respiratory motion artifact. There is a linear nonocclusive intravascular filling defect in the right upper lobe (165/3). It is indeterminate whether this is in a pulmonary artery branch or a branch of pulmonary vein. However, this appears to be involved in the pulmonary vein and likely related to motion or volume averaging artifact with adjacent structure. A pulmonary embolism is much less likely. No other large or central pulmonary artery embolus identified. Mediastinum/Nodes: There is no hilar or mediastinal adenopathy. The esophagus is grossly unremarkable. No mediastinal fluid collection. There is moderate pneumomediastinum extending to the neck, new since the prior CT. Lungs/Pleura: Bilateral streaky and hazy airspace opacities with predominant involvement of the subpleural lung bases in keeping with COVID-19. Overall improved aeration of the lungs compared to prior CT. No pleural effusion or pneumothorax. The central airways remain patent. Musculoskeletal: Degenerative changes of the spine. No acute osseous pathology. Review of the MIP images confirms the above findings. CT ABDOMEN and PELVIS FINDINGS No  intra-abdominal free air or free fluid. Hepatobiliary: Several subcentimeter hepatic hypodense foci are too small to characterize. The liver is otherwise unremarkable. No intrahepatic biliary ductal dilatation. The gallbladder is unremarkable. Pancreas: Unremarkable. No pancreatic ductal dilatation or surrounding inflammatory changes. Spleen: Normal in size without focal abnormality. Adrenals/Urinary Tract: The adrenal glands unremarkable. There is no hydronephrosis on either side. There is symmetric enhancement and excretion of contrast by both kidneys. There is a 1 cm left renal interpolar cyst. Additional smaller hypodense lesions in the kidneys are too small to characterize. The visualized ureters and urinary bladder appear unremarkable. Stomach/Bowel: There is moderate amount of stool throughout the colon and within the rectal vault. There is sigmoid diverticulosis without active inflammatory changes. There is no bowel obstruction or active inflammation. Appendectomy. Vascular/Lymphatic: Mild aortoiliac atherosclerotic disease. The IVC is unremarkable. No portal venous gas. There is no adenopathy. Reproductive: The prostate and seminal vesicles are grossly unremarkable. Other: None Musculoskeletal: No acute or significant osseous findings. Review of the MIP images confirms the above findings. IMPRESSION: 1. Moderate pneumomediastinum, new since the prior CT. 2. Apparent linear nonocclusive intravascular filling defect in the right upper lobe, likely artifactual and associated with pulmonary venous branch. No large or central pulmonary artery embolus identified. 3. Bilateral streaky and hazy airspace opacities with predominant involvement of the subpleural lung bases in keeping with COVID-19. Overall improved aeration of the lungs compared to prior CT. 4. Sigmoid diverticulosis. 5. Constipation no bowel obstruction.  6. Aortic Atherosclerosis (ICD10-I70.0). These results were called by telephone at the time of  interpretation on 01/19/2020 at 4:23 pm to provider [facility] Brinlynn Gorton , who verbally acknowledged these results. Electronically Signed   By: Anner Crete [name]   On: 01/19/2020 16:22    Scheduled Meds: . acidophilus  1 capsule Oral Daily  . alfuzosin  10 mg Oral q morning - 10a  . vitamin C  500 mg Oral Daily  . feeding supplement (GLUCERNA SHAKE)  237 mL Oral TID BM  . ferrous sulfate  325 mg Oral Q breakfast  . insulin aspart  0-9 Units Subcutaneous TID WC  . insulin aspart  5 Units Subcutaneous TID WC  . insulin detemir  10 Units Subcutaneous BID  . lactulose  20 g Oral BID  . loratadine  10 mg Oral Daily  . melatonin  3 mg Oral QHS  . methylPREDNISolone (SOLU-MEDROL) injection  40 mg Intravenous Q12H  . metoprolol tartrate  12.5 mg Oral BID  . montelukast  10 mg Oral QHS  . nystatin  5 mL Oral QID  . pantoprazole  40 mg Oral Daily  . Rivaroxaban  15 mg Oral BID WC   Followed by  . [START ON 01/28/2020] rivaroxaban  20 mg Oral Q supper  . rosuvastatin  10 mg Oral Daily  . senna-docusate  1 tablet Oral BID  . vitamin B-12  500 mcg Oral Daily  . Vitamin D (Ergocalciferol)  50,000 Units Oral Q7 days  . zinc sulfate  220 mg Oral Daily   Continuous Infusions: . sodium chloride     PRN Meds: sodium chloride, acetaminophen, bisacodyl, chlorpheniramine-HYDROcodone, cyclobenzaprine, guaiFENesin-dextromethorphan, Ipratropium-Albuterol, magnesium hydroxide, sodium chloride  Time spent: 35 minutes  Author: Berle Mull, MD [facility] 01/19/2020 8:23 PM  To reach On-call, see care teams to locate the attending and reach out via [email or web address]. Between 7PM-7AM, please contact night-coverage If you still have difficulty reaching the attending provider, please page the [facility] (Director on Call) for [facility] on amion for assistance.

## 2020-01-19 NOTE — Progress Notes (Signed)
Small maroon red smear on bedpad from rectum. No visible hemmroids. Ct scan done today. Will continue to monitor.   New mediastinium-no bipap-no I.S.  Pt on bedrest.

## 2020-01-19 NOTE — Progress Notes (Signed)
Moderate amount of dark brown stool resulted after dulcolax suppository.

## 2020-01-19 NOTE — Progress Notes (Signed)
Bladder scan performed, read as 356 mls urine in bladder.  In and out cath performed, obtained approximately 375 mls yellow urine without difficulty.  Patient stated he did feel some relief in his abdomen after in and out performed.

## 2020-01-19 NOTE — Progress Notes (Signed)
Pt request 5-10 mg melatonin to help sleep.

## 2020-01-20 ENCOUNTER — Inpatient Hospital Stay (HOSPITAL_COMMUNITY): Payer: Medicare Other

## 2020-01-20 DIAGNOSIS — J1282 Pneumonia due to Coronavirus disease 2019: Secondary | ICD-10-CM | POA: Diagnosis not present

## 2020-01-20 DIAGNOSIS — U071 COVID-19: Secondary | ICD-10-CM | POA: Diagnosis not present

## 2020-01-20 LAB — CBC WITH DIFFERENTIAL/PLATELET
Abs Immature Granulocytes: 0.32 10*3/uL — ABNORMAL HIGH (ref 0.00–0.07)
Basophils Absolute: 0.1 10*3/uL (ref 0.0–0.1)
Basophils Relative: 0 %
Eosinophils Absolute: 0 10*3/uL (ref 0.0–0.5)
Eosinophils Relative: 0 %
HCT: 50.1 % (ref 39.0–52.0)
Hemoglobin: 17 g/dL (ref 13.0–17.0)
Immature Granulocytes: 1 %
Lymphocytes Relative: 1 %
Lymphs Abs: 0.4 10*3/uL — ABNORMAL LOW (ref 0.7–4.0)
MCH: 29.7 pg (ref 26.0–34.0)
MCHC: 33.9 g/dL (ref 30.0–36.0)
MCV: 87.4 fL (ref 80.0–100.0)
Monocytes Absolute: 0.8 10*3/uL (ref 0.1–1.0)
Monocytes Relative: 3 %
Neutro Abs: 26.5 10*3/uL — ABNORMAL HIGH (ref 1.7–7.7)
Neutrophils Relative %: 95 %
Platelets: 209 10*3/uL (ref 150–400)
RBC: 5.73 MIL/uL (ref 4.22–5.81)
RDW: 11.9 % (ref 11.5–15.5)
WBC: 28.1 10*3/uL — ABNORMAL HIGH (ref 4.0–10.5)
nRBC: 0 % (ref 0.0–0.2)

## 2020-01-20 LAB — COMPREHENSIVE METABOLIC PANEL
ALT: 28 U/L (ref 0–44)
AST: 27 U/L (ref 15–41)
Albumin: 2.7 g/dL — ABNORMAL LOW (ref 3.5–5.0)
Alkaline Phosphatase: 73 U/L (ref 38–126)
Anion gap: 13 (ref 5–15)
BUN: 59 mg/dL — ABNORMAL HIGH (ref 8–23)
CO2: 30 mmol/L (ref 22–32)
Calcium: 8.8 mg/dL — ABNORMAL LOW (ref 8.9–10.3)
Chloride: 86 mmol/L — ABNORMAL LOW (ref 98–111)
Creatinine, Ser: 1.07 mg/dL (ref 0.61–1.24)
GFR, Estimated: >60 mL/min (ref >60)
Glucose, Bld: 88 mg/dL (ref 70–99)
Potassium: 4.9 mmol/L (ref 3.5–5.1)
Sodium: 129 mmol/L — ABNORMAL LOW (ref 135–145)
Total Bilirubin: 1.5 mg/dL — ABNORMAL HIGH (ref 0.3–1.2)
Total Protein: 5.7 g/dL — ABNORMAL LOW (ref 6.5–8.1)

## 2020-01-20 LAB — C-REACTIVE PROTEIN: CRP: 3 mg/dL — ABNORMAL HIGH (ref <1.0)

## 2020-01-20 LAB — D-DIMER, QUANTITATIVE: D-Dimer, Quant: 0.85 ug/mL-FEU — ABNORMAL HIGH (ref 0.00–0.50)

## 2020-01-20 LAB — MAGNESIUM: Magnesium: 2.6 mg/dL — ABNORMAL HIGH (ref 1.7–2.4)

## 2020-01-20 LAB — GLUCOSE, CAPILLARY: Glucose-Capillary: 109 mg/dL — ABNORMAL HIGH (ref 70–99)

## 2020-01-20 MED ORDER — FUROSEMIDE 10 MG/ML IJ SOLN: 20.0000 mg | Freq: Once | INTRAMUSCULAR | Status: DC

## 2020-01-20 MED ORDER — LORAZEPAM 2 MG/ML IJ SOLN
0.5000 mg | INTRAMUSCULAR | Status: DC | PRN
  Administered 2020-01-20 – 2020-01-22 (×3): 0.5 mg via INTRAVENOUS
  Filled 2020-01-20 (×3): qty 1

## 2020-01-20 MED ORDER — MORPHINE SULFATE (PF) 2 MG/ML IV SOLN
2.0000 mg | Freq: Once | INTRAVENOUS | Status: AC
  Administered 2020-01-20: 2 mg via INTRAVENOUS
  Filled 2020-01-20: qty 1

## 2020-01-20 MED ORDER — MORPHINE SULFATE (PF) 2 MG/ML IV SOLN: 2.0000 mg | INTRAVENOUS | Status: DC | PRN

## 2020-01-20 MED ORDER — MORPHINE SULFATE (PF) 2 MG/ML IV SOLN
2.0000 mg | INTRAVENOUS | Status: DC | PRN
  Administered 2020-01-20 – 2020-01-24 (×16): 2 mg via INTRAVENOUS
  Filled 2020-01-20 (×16): qty 1

## 2020-01-20 NOTE — Plan of Care (Signed)
Patient refusing most medications, did take Xarelto on 7 a to 7 p shift.  Patient drank an ensure, refused any other intake.  Multiple family members at bedside as patient has expressed desire to be DNR.  Medicated with Morphine x 3 this shift with improvement in shortness of breath.

## 2020-01-20 NOTE — Progress Notes (Addendum)
Educated patient regarding oxygen need and not being able to do bipap. Patient informed if condition worsens as a full code the next step would be intubation. Patient would like to have a conversation about outcomes/options with doctor. Pt states he "doesn't know how much fight he has left in him, but he doesn't want to tare his family apart".   Is barcinitib course completed? Any other medications to try?    Cl and Na low on labs

## 2020-01-20 NOTE — Progress Notes (Signed)
[facility] Progress Note  Patient: Connor [name]    FMB:[id number]  DOA: 12/22/2019     Date of Service: the patient was seen and examined on 01/20/2020  Brief hospital course: 74 year old male with polycythemia vera, BPH, mild COPD, OSA on CPAP at home,strong family history ofcoronary artery disease, negative stress test 2007,diagnosed with Covid on 9/16. Initially mild symptoms. Went to receive monoclonal antibody infusion on 9/22. After treatment oxygen was 85% so referred to ER. Unvaccinated against COVID-19. Chest x-ray with bilateral multifocal groundglass infiltrates. Remains in the hospital with very high oxygen requirement despite maximum medical therapy. 10/9 pneumomediastinum on CT scan.  BiPAP discontinued.  Incentive spirometer discontinued.  In and out catheterization x1. 10/10 patient requested to be transition to complete comfort.  Patient understood the meaning of DNR/DNI and wanted to transition to DNR. Wife and family members were allowed to visit the patient. Her other family member is planning to visit from [place] and currently awaiting for them to see the patient.  Currently plan is comfort care.  Assessment and Plan: Acute hypoxemic respiratory failure COVID-19 pneumonia. Acute on chronic HFpEF. Complicated by pneumomediastinum seen on 01/19/2020.  CXR: hazy bilateral peripheral opacities CT chest: GGO, consolidation with pneumomediastinum without PE Initial positive Covid test 12/27/2019 Oxygen requirement: Heated high flow at 100% FiO2 with NRB. CRP: Dropped to 0.8.  Trending up to 3.0. Remdesivir:Completed 5-day treatment course Steroids:Currently on IV Solu-Medrol Started on 01/01/2020.  Starting to taper. Baricitinib/Actemra(off-label use):completed14-day treatment course on 01/17/2020. The investigational nature of this medication was discussed with the patient and they choose to proceed as the potential benefits are felt to outweigh risks  at this time. Diuretics.   Aggressively treated with IV Lasix 40 mg twice daily. Currently using Lasix as needed. Antibiotics:Currently not indicated Vitamin C and Zinc:Continue Pneumomediastinum: Discussed with PCCM.  Currently recommending to hold off on BiPAP as well as incentive spirometry. No further intervention needed for now. Isolation: CT value 30.3.  On 01/18/2020.  May repeat another test on 01/23/2020 versus transitioning out of the isolation based on clinical examination on 01/24/2020.  Overall plan:Patient has requested to be transition to comfort care. We will continue to support with IV morphine and IV Ativan as needed. Should the patient require multiple doses he may require morphine infusion.  The treatment plan and use of medications and known side effects were discussed with patient/family. It was clearly explained that Complete risks and long-term side effects are unknown, however in the best clinical judgment they seem to be of some clinical benefit rather than medical risks. Patient/family agree with the treatment plan and want to receive these treatments as indicated.  Bilateral DVT: Initially treated with Lovenox and now on Xarelto. Continue. CT angio chest on 01/03/2020 negative for pulmonary embolism. Repeat CT scan on 01/19/2020 also negative for pulmonary embolism. Patient had one episode of blood in the stool on 01/19/2020.  Will monitor for now.  Sleep apnea: Using BiPAP at night with100% FiO2. CPAP at home. Currently PAP therapy on hold due to pneumomediastinum.  Constipation. Resolved. Continue bowel regimen initiated. X-ray abdomen on 01/18/2020 shows stool burden primarily in the ascending colon again. CT abdomen 01/19/2020 shows rectal stool burden.  Improved with Dulcolax suppository. No evidence of intra-abdominal pathology.  Oral thrush. On nystatin.  Essential hypertension: Blood pressure fairly stable. Cozaar on  hold.  Hyperlipidemia:  On Crestor.  Currently on hold given comfort care protocol.  Hyperbilirubinemia. Monitor for now.  Mild hyponatremia. Currently diuresis on  hold.  Will monitor.  BPH. Continue current regimen. In and out catheterization x1 on 01/19/2020.  Vitamin D deficiency history. Resumed on 01/17/2020 per family request  Polycythemia Vera-JAK2 negative and chronic immune thrombocytopenia Counts currently stable. Monitor. Follows up with [name].  GERD. SP Nissen fundoplication in 1610. Currently on as needed PPI.  Sinus tachycardia. Response to hypoxia and pneumonia and pneumomediastinum.  Obesity Placing the patient at high risk for poor outcome. Body mass index is 31.38 kg/m.   Leukocytosis. Likely stress reaction pneumomediastinum. Currently improving. At present patient preferred to transition to comfort care protocol.  Goals of care conversation. Multiple conversation with the patient on 01/20/2020. Multiple conversation with the wife as well as 2 daughters on 01/20/2020. Initially patient requested to be transition to complete comfort.  "I want some compassion, I am tired".  Discussed in detail regarding what he would mean by that.  Explained the difference between symptom control, DNR/DNI, comfort approach.  Patient preferred to transition to complete comfort.  Patient was given a dose of morphine and when reevaluated after the dose patient remained firm with a plan to transition to complete comfort. Wife was present during this conversation. Discussed with wife regarding patient's prognosis.  Explained in detail regarding patient's current medical condition.  Showed the images of CT scan.  Multiple family members also visited the patient. Daughter had some question about looking for infection, explained that at present patient does not have any ongoing signs of infection with worsening leukocytosis or fever. while we cannot deny the  possibility of him having infection at present given his preference to transition to comfort it would not change the management. Explained that during comfort he would not be checking patient's CBG or other blood work and we would not be using insulin sliding scale.  Also explained why and how we will be using medicine like morphine and Ativan for symptom control. Explained to the family that children should not visit patients in the hospital during Covid pandemic. Explained the rationale, why the patient is not receiving Lasix due to his poor p.o. intake today and clinical lack of volume overload evidence. Explained to the family that given current circumstances the patient definitely does not have reserves to survive cardiac arrest.  And not doing comfort care will be going against patient's wishes. Explained to the family that we are still continuing therapies like inhalers, steroids, blood pressure medication, anticoagulation, acid reflux medication, vitamins for now but it would be patient's decision to take them or not.  Diet: Regular diet DVT Prophylaxis:   Place and maintain sequential compression device Start: 01/07/20 1315 Rivaroxaban (XARELTO) tablet 15 mg  rivaroxaban (XARELTO) tablet 20 mg    Advance goals of care discussion: DNR  Family Communication: family was present at bedside, at the time of interview.  The pt provided permission to discuss medical plan with the family. Opportunity was given to ask question and all questions were answered satisfactorily.   Disposition:  Status is: Inpatient  Remains inpatient appropriate because:Hemodynamically unstable   Dispo: The patient is from: Home              Anticipated d/c is to: to be determined               Anticipated d/c date is: to be determined               Patient currently is not medically stable to d/c.  Subjective: Reports chest pain.  No abdominal pain.  No nausea no vomiting.  Fatigue and tiredness.  Physical  Exam:  General: Appear in severe distress, no Rash; Oral Mucosa Clear, dry. no Abnormal Neck Mass Or lumps, Conjunctiva normal  Cardiovascular: S1 and S2 Present, no Murmur, Respiratory: increased respiratory effort, Bilateral Air entry present and bilateral  Crackles, no wheezes Abdomen: Bowel Sound present, Soft and no tenderness Extremities: no Pedal edema Neurology: alert and oriented to time, place, and person affect appropriate. no new focal deficit Gait not checked due to patient safety concerns  Vitals:   01/20/20 0728 01/20/20 0801 01/20/20 0937 01/20/20 1413  BP:   122/64   Pulse: (!) 110  (!) 126 (!) 127  Resp: 16  20 (!) 23  Temp:   98.2 F (36.8 C)   TempSrc:   Oral   SpO2: (!) 89% (!) 86% (!) 89% (!) 87%  Weight:      Height:        Intake/Output Summary (Last 24 hours) at 01/20/2020 1955 Last data filed at 01/20/2020 1822 Gross per 24 hour  Intake 10 ml  Output 1950 ml  Net -1940 ml   Filed Weights   12/13/2019 1626  Weight: 102.1 kg    Data Reviewed: I have personally reviewed and interpreted daily labs, tele strips, imagings as discussed above. I reviewed all nursing notes, pharmacy notes, vitals, pertinent old records I have discussed plan of care as described above with RN and patient/family.  CBC: Recent Labs  Lab 01/16/20 0517 01/17/20 0443 01/18/20 0429 01/19/20 0932 01/20/20 0523  WBC 21.9* 19.7* 24.7* 38.5* 28.1*  NEUTROABS 20.7* 18.6* 22.9* 35.8* 26.5*  HGB 17.7* 17.6* 18.2* 17.7* 17.0  HCT 52.5* 51.9 53.4* 52.4* 50.1  MCV 88.2 88.0 87.5 87.3 87.4  PLT 221 232 221 252 789   Basic Metabolic Panel: Recent Labs  Lab 01/16/20 0517 01/17/20 0443 01/18/20 0429 01/19/20 0544 01/19/20 0920 01/20/20 0523  NA 134* 133* 130*  --  129* 129*  K 4.2 4.4 4.5  --  4.5 4.9  CL 93* 90* 89*  --  88* 86*  CO2 29 30 28   --  25 30  GLUCOSE 164* 176* 158*  --  169* 88  BUN 55* 59* 57*  --  68* 59*  CREATININE 1.03 0.92 1.04  --  1.27* 1.07   CALCIUM 8.8* 9.1 9.1  --  8.5* 8.8*  MG  --   --  2.7* 2.6*  --  2.6*    Studies: DG CHEST PORT 1 VIEW  Result Date: 01/20/2020 CLINICAL DATA:  COVID EXAM: PORTABLE CHEST 1 VIEW COMPARISON:  January 18, 2020, ectopia ninth 2021 FINDINGS: The cardiomediastinal silhouette is unchanged in contour.Atherosclerotic calcifications of the aorta. Pneumomediastinum seen on recent CT has decreased in conspicuity. No pleural effusion. No significant pneumothorax. Multifocal bilateral peripheral predominant heterogeneous opacities, consistent with the sequela of COVID-19 infection. Visualized abdomen is unremarkable. Multilevel degenerative changes of the thoracic spine. IMPRESSION: 1. Multifocal peripheral predominant heterogeneous opacities, consistent with the sequela of COVID-19 infection. 2. Decreased conspicuity of pneumomediastinum. Electronically Signed   By: Valentino Saxon MD   On: 01/20/2020 16:19    Scheduled Meds:  acidophilus  1 capsule Oral Daily   alfuzosin  10 mg Oral q morning - 10a   vitamin C  500 mg Oral Daily   feeding supplement (GLUCERNA SHAKE)  237 mL Oral TID BM   lactulose  20 g Oral BID   loratadine  10 mg Oral Daily   melatonin  3 mg Oral QHS   methylPREDNISolone (SOLU-MEDROL) injection  40 mg Intravenous Q12H   metoprolol tartrate  12.5 mg Oral BID   montelukast  10 mg Oral QHS   nystatin  5 mL Oral QID   pantoprazole  40 mg Oral Daily   Rivaroxaban  15 mg Oral BID WC   Followed by   Derrill Memo ON 01/28/2020] rivaroxaban  20 mg Oral Q supper   senna-docusate  1 tablet Oral BID   vitamin B-12  500 mcg Oral Daily   Vitamin D (Ergocalciferol)  50,000 Units Oral Q7 days   zinc sulfate  220 mg Oral Daily   Continuous Infusions:  sodium chloride     PRN Meds: sodium chloride, acetaminophen, bisacodyl, chlorpheniramine-HYDROcodone, cyclobenzaprine, guaiFENesin-dextromethorphan, Ipratropium-Albuterol, LORazepam, magnesium hydroxide, morphine injection,  sodium chloride  Time spent: 35 minutes  Author: Berle Mull, MD [facility] 01/20/2020 7:55 PM  To reach On-call, see care teams to locate the attending and reach out via [email or web address]. Between 7PM-7AM, please contact night-coverage If you still have difficulty reaching the attending provider, please page the [facility] (Director on Call) for [facility] on amion for assistance.

## 2020-01-21 ENCOUNTER — Other Ambulatory Visit: Payer: Self-pay | Admitting: Family Medicine

## 2020-01-21 DIAGNOSIS — J1282 Pneumonia due to Coronavirus disease 2019: Secondary | ICD-10-CM | POA: Diagnosis not present

## 2020-01-21 DIAGNOSIS — U071 COVID-19: Secondary | ICD-10-CM | POA: Diagnosis not present

## 2020-01-21 NOTE — Plan of Care (Signed)
Pt beginning to have complaints of pain today. RN asks each time I am in the room. Pt's pain remains 5/10 and generalized. Family (wife, 2 daughters, and son) have been rotating at the bedside today. Pt was discontinued off isolation since he is past the 21 day mark of initial covid diagnosis per Dr. Posey Pronto. Pt currently lying in bed asleep with wife and daughter at bedside. Pt remains on 25L/80% HHFNC. Pt family denies needs at this time. RN will continue to monitor patient.   Problem: Education: Goal: Knowledge of risk factors and measures for prevention of condition will improve Outcome: Progressing   Problem: Coping: Goal: Psychosocial and spiritual needs will be supported Outcome: Progressing   Problem: Respiratory: Goal: Will maintain a patent airway Outcome: Progressing Goal: Complications related to the disease process, condition or treatment will be avoided or minimized Outcome: Progressing   Problem: Health Behavior/Discharge Planning: Goal: Ability to manage health-related needs will improve Outcome: Progressing   Problem: Clinical Measurements: Goal: Ability to maintain clinical measurements within normal limits will improve Outcome: Progressing Goal: Will remain free from infection Outcome: Progressing Goal: Diagnostic test results will improve Outcome: Progressing Goal: Respiratory complications will improve Outcome: Progressing Goal: Cardiovascular complication will be avoided Outcome: Progressing   Problem: Activity: Goal: Risk for activity intolerance will decrease Outcome: Progressing   Problem: Nutrition: Goal: Adequate nutrition will be maintained Outcome: Progressing   Problem: Coping: Goal: Level of anxiety will decrease Outcome: Progressing   Problem: Elimination: Goal: Will not experience complications related to bowel motility Outcome: Progressing Goal: Will not experience complications related to urinary retention Outcome: Progressing    Problem: Pain Managment: Goal: General experience of comfort will improve Outcome: Progressing   Problem: Safety: Goal: Ability to remain free from injury will improve Outcome: Progressing   Problem: Skin Integrity: Goal: Risk for impaired skin integrity will decrease Outcome: Progressing

## 2020-01-21 NOTE — Progress Notes (Signed)
[facility] Progress Note  Patient: Connor [name]    TGY:[id number]  DOA: 12/23/2019     Date of Service: the patient was seen and examined on 01/21/2020  Brief hospital course: 74 year old male with polycythemia vera, BPH, mild COPD, OSA on CPAP at home,strong family history ofcoronary artery disease, negative stress test 2007,diagnosed with Covid on 9/16. Initially mild symptoms. Went to receive monoclonal antibody infusion on 9/22. After treatment oxygen was 85% so referred to ER. Unvaccinated against COVID-19. Chest x-ray with bilateral multifocal groundglass infiltrates. Remains in the hospital with very high oxygen requirement despite maximum medical therapy. 10/9 pneumomediastinum on CT scan.  BiPAP discontinued.  Incentive spirometer discontinued.  In and out catheterization x1. 10/10 patient requested to be transition to complete comfort.  Patient understood the meaning of DNR/DNI and wanted to transition to DNR. Wife and family members were allowed to visit the patient. Her other family member is planning to visit from [place] and currently awaiting for them to see the patient.  Currently plan is comfort care.  Assessment and Plan: Acute hypoxemic respiratory failure COVID-19 pneumonia. Acute on chronic HFpEF. Complicated by pneumomediastinum seen on 01/19/2020.  CXR: hazy bilateral peripheral opacities CT chest: GGO, consolidation with pneumomediastinum without PE Initial positive Covid test 12/27/2019 Oxygen requirement: Heated high flow at 100% FiO2 with NRB. CRP: Dropped to 0.8.  Trending up to 3.0. Remdesivir:Completed 5-day treatment course Steroids:Currently on IV Solu-Medrol Started on 01/04/2020.  Starting to taper. Baricitinib/Actemra(off-label use):completed14-day treatment course on 01/17/2020. The investigational nature of this medication was discussed with the patient and they choose to proceed as the potential benefits are felt to outweigh risks  at this time. Diuretics.   Aggressively treated with IV Lasix 40 mg twice daily. Currently using Lasix as needed. Antibiotics:Currently not indicated Vitamin C and Zinc:Continue Pneumomediastinum: Discussed with PCCM.  Currently recommending to hold off on BiPAP as well as incentive spirometry. No further intervention needed for now. Isolation: CT value 30.3.  On 01/18/2020.  May repeat another test on 01/23/2020 versus transitioning out of the isolation based on clinical examination on 01/24/2020.  Overall plan:Patient has requested to be transition to comfort care. We will continue to support with IV morphine and IV Ativan as needed. Should the patient require multiple doses he may require morphine infusion.  The treatment plan and use of medications and known side effects were discussed with patient/family. It was clearly explained that Complete risks and long-term side effects are unknown, however in the best clinical judgment they seem to be of some clinical benefit rather than medical risks. Patient/family agree with the treatment plan and want to receive these treatments as indicated.  Bilateral DVT: Initially treated with Lovenox and now on Xarelto. Continue. CT angio chest on 01/03/2020 negative for pulmonary embolism. Repeat CT scan on 01/19/2020 also negative for pulmonary embolism. Patient had 4 bowel movements with blood in it with progressively increasing bleeding therefore we will discontinue Xarelto.  Sleep apnea: Using BiPAP at night with100% FiO2. CPAP at home. Currently PAP therapy on hold due to pneumomediastinum.  Constipation. Resolved. Continue bowel regimen initiated. X-ray abdomen on 01/18/2020 shows stool burden primarily in the ascending colon again. CT abdomen 01/19/2020 shows rectal stool burden.  Improved with Dulcolax suppository. No evidence of intra-abdominal pathology.  Oral thrush. On nystatin.  Essential hypertension: Blood pressure  fairly stable. Cozaar on hold.  Hyperlipidemia:  On Crestor.  Currently on hold given comfort care protocol.  Hyperbilirubinemia. Monitor for now.  Mild hyponatremia. Currently  diuresis on hold.  Will monitor.  BPH. Continue current regimen. In and out catheterization x1 on 01/19/2020.  Vitamin D deficiency history. Resumed on 01/17/2020 per family request  Polycythemia Vera-JAK2 negative and chronic immune thrombocytopenia Counts currently stable. Monitor. Follows up with [name].  GERD. SP Nissen fundoplication in 5188. Currently on as needed PPI.  Sinus tachycardia. Response to hypoxia and pneumonia and pneumomediastinum.  Obesity Placing the patient at high risk for poor outcome. Body mass index is 31.38 kg/m.   Sigmoid diverticulosis. One of the nursing note mentions that the patient would like antibiotics for sigmoid diverticulosis. I had an extensive discussion with the patient regarding whether his desire is for antibiotic treatment or not should the condition indicated and patient categorically say that he does not want anything that prolongs his life or suffering and would like to continue comfort care. Also sigmoid diverticulosis does not require antibiotic, this was explained to patient.  Leukocytosis. Likely stress reaction pneumomediastinum. Currently improving. At present patient preferred to transition to comfort care protocol.  GI bleed. Likely diverticular in nature. Unfortunately at present currently cannot continue anticoagulation. This puts the patient at high risk for worsening of DVT. Explained the risks to the family. Patient currently comfort care. Continue to monitor.  No intervention.  Goals of care conversation. Multiple conversation with the patient on 01/20/2020. Multiple conversation with the wife as well as 2 daughters on 01/20/2020. Initially patient requested to be transition to complete comfort.  "I want some  compassion, I am tired".  Discussed in detail regarding what he would mean by that.  Explained the difference between symptom control, DNR/DNI, comfort approach.  Patient preferred to transition to complete comfort.  Patient was given a dose of morphine and when reevaluated after the dose patient remained firm with a plan to transition to complete comfort. Wife was present during this conversation. Discussed with wife regarding patient's prognosis.  Explained in detail regarding patient's current medical condition.  Showed the images of CT scan.  Multiple family members also visited the patient. Daughter had some question about looking for infection, explained that at present patient does not have any ongoing signs of infection with worsening leukocytosis or fever. while we cannot deny the possibility of him having infection at present given his preference to transition to comfort it would not change the management. Explained that during comfort he would not be checking patient's CBG or other blood work and we would not be using insulin sliding scale.  Also explained why and how we will be using medicine like morphine and Ativan for symptom control. Explained to the family that children should not visit patients in the hospital during Covid pandemic. Explained the rationale, why the patient is not receiving Lasix due to his poor p.o. intake today and clinical lack of volume overload evidence. Explained to the family that given current circumstances the patient definitely does not have reserves to survive cardiac arrest.  And not doing comfort care will be going against patient's wishes. Explained to the family that we are still continuing therapies like inhalers, steroids, blood pressure medication, anticoagulation, acid reflux medication, vitamins for now but it would be patient's decision to take them or not.  Another conversation with patient regarding goals of care on 01/21/2020. Patient would not  like any medication or procedures or interventions that prolong his life of suffering. Question was asked regarding antibiotics and patient denied use of antibiotics. Patient would like to continue with comfort care protocol. Patient thinks  that current as needed bolus regimen works and he does not require any in the infusion right now. Patient also told the RN the same that he would not like to prolong his life.  Diet: Regular diet DVT Prophylaxis:   Place and maintain sequential compression device Start: 01/07/20 1315   Advance goals of care discussion: DNR, comfort care  Family Communication: family was present at bedside. The pt provided permission to discuss medical plan with the family. Opportunity was given to ask question and all questions were answered satisfactorily.   Disposition:  Status is: Inpatient  Remains inpatient appropriate because:Hemodynamically unstable   Dispo: The patient is from: Home              Anticipated d/c is to: to be determined               Anticipated d/c date is: to be determined               Patient currently is not medically stable to d/c.  Subjective: Pain currently controlled with the medication.  No nausea no vomiting.  Breathing still heavy clinically patient feels that he is breathing okay.  No nausea no vomiting.  3 more episodes of bowel movement with small amount of blood.  Physical Exam: General: Appear in moderate distress, no Rash; Oral Mucosa Clear, moist. no Abnormal Neck Mass Or lumps, Conjunctiva normal  Cardiovascular: S1 and S2 Present, no Murmur, Respiratory: increased respiratory effort, Bilateral Air entry present and bilateral  Crackles, no wheezes Abdomen: Bowel Sound present, Soft and no tenderness Extremities: no Pedal edema Neurology: alert and oriented to time, place, and person affect appropriate. no new focal deficit Gait not checked due to patient safety concerns  Vitals:   01/21/20 0357 01/21/20 0403  01/21/20 0516 01/21/20 0852  BP:   117/86   Pulse:  (!) 112  (!) 114  Resp:  20 20   Temp:   97.9 F (36.6 C)   TempSrc:   Oral   SpO2: 90% (!) 89% (!) 89% (!) 86%  Weight:      Height:        Intake/Output Summary (Last 24 hours) at 01/21/2020 2018 Last data filed at 01/21/2020 1828 Gross per 24 hour  Intake --  Output 2650 ml  Net -2650 ml   Filed Weights   12/16/2019 1626  Weight: 102.1 kg    Data Reviewed: I have personally reviewed and interpreted daily labs, tele strips, imagings as discussed above. I reviewed all nursing notes, pharmacy notes, vitals, pertinent old records I have discussed plan of care as described above with RN and patient/family.  CBC: Recent Labs  Lab 01/16/20 0517 01/17/20 0443 01/18/20 0429 01/19/20 0932 01/20/20 0523  WBC 21.9* 19.7* 24.7* 38.5* 28.1*  NEUTROABS 20.7* 18.6* 22.9* 35.8* 26.5*  HGB 17.7* 17.6* 18.2* 17.7* 17.0  HCT 52.5* 51.9 53.4* 52.4* 50.1  MCV 88.2 88.0 87.5 87.3 87.4  PLT 221 232 221 252 751   Basic Metabolic Panel: Recent Labs  Lab 01/16/20 0517 01/17/20 0443 01/18/20 0429 01/19/20 0544 01/19/20 0920 01/20/20 0523  NA 134* 133* 130*  --  129* 129*  K 4.2 4.4 4.5  --  4.5 4.9  CL 93* 90* 89*  --  88* 86*  CO2 29 30 28   --  25 30  GLUCOSE 164* 176* 158*  --  169* 88  BUN 55* 59* 57*  --  68* 59*  CREATININE 1.03 0.92 1.04  --  1.27* 1.07  CALCIUM 8.8* 9.1 9.1  --  8.5* 8.8*  MG  --   --  2.7* 2.6*  --  2.6*    Studies: No results found.  Scheduled Meds: . acidophilus  1 capsule Oral Daily  . alfuzosin  10 mg Oral q morning - 10a  . feeding supplement (GLUCERNA SHAKE)  237 mL Oral TID BM  . lactulose  20 g Oral BID  . melatonin  3 mg Oral QHS  . methylPREDNISolone (SOLU-MEDROL) injection  40 mg Intravenous Q12H  . metoprolol tartrate  12.5 mg Oral BID  . montelukast  10 mg Oral QHS  . nystatin  5 mL Oral QID  . pantoprazole  40 mg Oral Daily  . Rivaroxaban  15 mg Oral BID WC   Followed by  .  [START ON 01/28/2020] rivaroxaban  20 mg Oral Q supper  . senna-docusate  1 tablet Oral BID   Continuous Infusions: . sodium chloride     PRN Meds: sodium chloride, acetaminophen, bisacodyl, chlorpheniramine-HYDROcodone, cyclobenzaprine, guaiFENesin-dextromethorphan, Ipratropium-Albuterol, LORazepam, magnesium hydroxide, morphine injection, sodium chloride  Time spent: 35 minutes  Author: Berle Mull, MD [facility] 01/21/2020 8:18 PM  To reach On-call, see care teams to locate the attending and reach out via [email or web address]. Between 7PM-7AM, please contact night-coverage If you still have difficulty reaching the attending provider, please page the [facility] (Director on Call) for [facility] on amion for assistance.

## 2020-01-21 NOTE — Progress Notes (Signed)
RN at bedside for AM assessment. Pt lying in bed resting on 25L/80% FiO2. RN asked about patient's pain pt denies the need for Morphine at this time. Pt reports to RN "I have made my decision to be comfortable and I am exhausted." RN provided emotional support to patient. RN washed patient's face with a warm wash cloth per his request. Pt does get short of breath with minimal exertion/speaking without NRB mask on top of heated high flow. Pt denies any needs at this time. RN educated pt to call with any needs.

## 2020-01-21 NOTE — Progress Notes (Addendum)
Small red stool x3-Ct showed sigmoid diverticulosis. Patient states he has had this issue before and is "ok" with IV antibiotic if neccessary.  HR still elevated. BP tolerating current metoprolol.  Morphine is helping dyspnea/tachypnea. Patient educated to ask for morphine when he feels he needs it.

## 2020-01-22 ENCOUNTER — Ambulatory Visit: Payer: Medicare Other | Admitting: Hematology & Oncology

## 2020-01-22 ENCOUNTER — Other Ambulatory Visit: Payer: Medicare Other

## 2020-01-22 DIAGNOSIS — U071 COVID-19: Secondary | ICD-10-CM | POA: Diagnosis not present

## 2020-01-22 DIAGNOSIS — J1282 Pneumonia due to Coronavirus disease 2019: Secondary | ICD-10-CM | POA: Diagnosis not present

## 2020-01-22 MED ORDER — GLYCOPYRROLATE 0.2 MG/ML IJ SOLN: 0.2000 mg | INTRAMUSCULAR | Status: DC | PRN

## 2020-01-22 MED ORDER — ONDANSETRON HCL 4 MG/2ML IJ SOLN: 4.0000 mg | Freq: Four times a day (QID) | INTRAMUSCULAR | Status: DC | PRN

## 2020-01-22 MED ORDER — DIPHENHYDRAMINE HCL 12.5 MG/5ML PO ELIX: 12.5000 mg | ORAL_SOLUTION | Freq: Four times a day (QID) | ORAL | Status: DC | PRN

## 2020-01-22 MED ORDER — ACETAMINOPHEN 650 MG RE SUPP: 650.0000 mg | Freq: Four times a day (QID) | RECTAL | Status: DC | PRN

## 2020-01-22 MED ORDER — GLYCOPYRROLATE 1 MG PO TABS
1.0000 mg | ORAL_TABLET | ORAL | Status: DC | PRN
  Filled 2020-01-22: qty 1

## 2020-01-22 MED ORDER — MORPHINE SULFATE 2 MG/ML IV SOLN
INTRAVENOUS | Status: DC
  Filled 2020-01-22: qty 25

## 2020-01-22 MED ORDER — LORAZEPAM 2 MG/ML IJ SOLN: 1.0000 mg | INTRAMUSCULAR | Status: DC | PRN

## 2020-01-22 MED ORDER — ACETAMINOPHEN 325 MG PO TABS: 650.0000 mg | ORAL_TABLET | Freq: Four times a day (QID) | ORAL | Status: DC | PRN

## 2020-01-22 MED ORDER — LORAZEPAM 2 MG/ML PO CONC
1.0000 mg | ORAL | Status: DC | PRN
  Filled 2020-01-22: qty 0.5

## 2020-01-22 MED ORDER — ONDANSETRON 4 MG PO TBDP: 4.0000 mg | ORAL_TABLET | Freq: Four times a day (QID) | ORAL | Status: DC | PRN

## 2020-01-22 MED ORDER — METOPROLOL TARTRATE 5 MG/5ML IV SOLN: 2.5000 mg | Freq: Four times a day (QID) | INTRAVENOUS | Status: DC | PRN

## 2020-01-22 MED ORDER — LORAZEPAM 1 MG PO TABS: 1.0000 mg | ORAL_TABLET | ORAL | Status: DC | PRN

## 2020-01-22 MED ORDER — HALOPERIDOL LACTATE 5 MG/ML IJ SOLN: 2.0000 mg | INTRAMUSCULAR | Status: DC | PRN

## 2020-01-22 MED ORDER — ALBUTEROL SULFATE (2.5 MG/3ML) 0.083% IN NEBU: 2.5000 mg | INHALATION_SOLUTION | RESPIRATORY_TRACT | Status: DC | PRN

## 2020-01-22 MED ORDER — DIPHENHYDRAMINE HCL 50 MG/ML IJ SOLN: 12.5000 mg | Freq: Four times a day (QID) | INTRAMUSCULAR | Status: DC | PRN

## 2020-01-22 MED ORDER — SODIUM CHLORIDE 0.9% FLUSH: 9.0000 mL | INTRAVENOUS | Status: DC | PRN

## 2020-01-22 MED ORDER — MORPHINE 100MG IN NS 100ML (1MG/ML) PREMIX INFUSION
2.0000 mg/h | INTRAVENOUS | Status: DC
  Administered 2020-01-22 – 2020-01-26 (×3): 2 mg/h via INTRAVENOUS
  Filled 2020-01-22 (×4): qty 100

## 2020-01-22 MED ORDER — NALOXONE HCL 0.4 MG/ML IJ SOLN: 0.4000 mg | INTRAMUSCULAR | Status: DC | PRN

## 2020-01-22 MED ORDER — DIPHENHYDRAMINE HCL 50 MG/ML IJ SOLN: 12.5000 mg | INTRAMUSCULAR | Status: DC | PRN

## 2020-01-22 MED ORDER — HALOPERIDOL LACTATE 2 MG/ML PO CONC
2.0000 mg | ORAL | Status: DC | PRN
  Filled 2020-01-22: qty 1

## 2020-01-22 MED ORDER — HALOPERIDOL 2 MG PO TABS
2.0000 mg | ORAL_TABLET | ORAL | Status: DC | PRN
  Filled 2020-01-22: qty 1

## 2020-01-22 NOTE — Progress Notes (Signed)
RN consulted chaplain services to assist patient's family on how to explain to the patients 76 year old grandson about coping with death, as he has never experienced death before. Patient's family reports chaplain services were very helpful.  Family also notified RN that they would be using [employer] in [place], [place].

## 2020-01-22 NOTE — Progress Notes (Signed)
[facility] Progress Note  Patient: Connor [name]    WJX:[id number]  DOA: 01/10/2020     Date of Service: the patient was seen and examined on 01/22/2020  Brief hospital course: 74 year old male with polycythemia vera, BPH, mild COPD, OSA on CPAP at home,strong family history ofcoronary artery disease, negative stress test 2007,diagnosed with Covid on 9/16. Unvaccinated against COVID-19. 9/16 diagnosed with COVID-19 at [facility] 9/22.  Monitor antibody infusion, after treatment, oxygen was 85% so referred to ER. Admitted to hospital 9/24 started on baricitinib. 9/26 completed remdesivir. 9/27 venous Doppler positive for right leg DVT started on Xarelto.  CT PE negative for PE. 9/30 CPAP transition to BiPAP, transferred to progressive care unit. 10/6 10/8 hypoxia worsening, transition to heated high flow nasal cannula. 10/9 pneumomediastinum on CT scan. BiPAP discontinued.  Incentive spirometer discontinued. In and out catheterization x1. 10/10 patient requested to be transition to complete comfort.  Patient understood the meaning of DNR/DNI and wanted to transition to DNR. Wife and family members were allowed to visit the patient. 10/11 patient had a chance to visit all his family members.  Verified that he is still wants to continue with comfort care protocol.  Verified that he does not want any measures that will prolong his life including antibiotics. 10/12 due to persistent tachypnea and tachycardia with ongoing chest pain patient was started on morphine infusion at 2 mg/h.  Currently plan is continue comfort care, anticipating in hospital death.  Assessment and Plan: Acute hypoxemic respiratory failure COVID-19 pneumonia. Acute on chronic HFpEF. Complicated by pneumomediastinum seen on 01/19/2020.  CXR: hazy bilateral peripheral opacities CT chest: 9/23 GGO, negative for PE. CT chest: 10/9 consolidation with pneumomediastinum without PE Oxygen  requirement:Heated high flow at 100% FiO2 with NRB. CRP: Dropped to 0.8.  Trending up to 3.0 Remdesivir:Completed 5-day treatment course Steroids:Currently on IV Solu-Medrol.  Continue. Baricitinib/Actemra(off-label use):completed14-day treatment course on 01/17/2020. The investigational nature of this medication was discussed with the patient and they choose to proceed as the potential benefits are felt to outweigh risks at this time. Diuretics.Aggressively treated with IV Lasix 40 mg twice daily. Currently using Lasix as needed. Antibiotics:Currently not indicated Vitamin C and Zinc:Continued throughout the course.  Now comfort care. Pneumomediastinum:Discussed with PCCM. Currently recommending to hold off on BiPAP as well as incentive spirometry. No further intervention needed for now.  Now comfort care.  Isolation:CT value 30.3 on a Covid teston 01/18/2020. Isolation was removed on 01/21/2020 based on clinical examination.  Overall plan:Continue comfort care measures in the hospital.  Anticipating in hospital death in the next 24 to 48 hours.  Bilateral DVT: Initially treated with Lovenox and now on Xarelto. CT angio chest on 01/03/2020 negative for pulmonary embolism. Repeat CT scan on 01/19/2020 also negative for pulmonary embolism. Patient had 4 bowel movements with blood in it, with progressively increasing bleeding, therefore we will discontinue Xarelto.  Sleep apnea: Was Using BiPAP at night with100% FiO2. CPAP at home. Currently PAP therapy on hold due to pneumomediastinum.  Constipation. Resolved. X-ray abdomen on 01/18/2020 shows stool burden primarily in the ascending colon again. CT abdomen 01/19/2020 shows rectal stool burden. Improved with Dulcolax suppository. No evidence of intra-abdominal pathology that requires intervention no change in treatment.  Oral thrush. Was On nystatin.  Essential hypertension: Blood pressure fairly stable,  anticipating drop in blood pressure with comfort measures.  Hyperlipidemia:  On Crestor.  Currently on hold given comfort care protocol.  Hyperbilirubinemia. Monitor for now.  Mild hyponatremia.  Currently diuresis on hold.  Comfort care.  BPH. Continue current regimen. In and out catheterization/Foley catheter as needed for end-of-life care.  Vitamin D deficiency history. Resumedon 10/7/2021per family request, currently on comfort care protocol.  Polycythemia Vera-JAK2 negative and chronic immune thrombocytopenia Counts currently stable. Follows up with [name].  GERD. S/P Nissen fundoplication in 1829. Currently comfort care.  Sinus tachycardia. Response to hypoxia and pneumoniaand pneumomediastinum.  Obesity Placing the patient at high risk for poor outcome from COVID-19 pneumonia. Body mass index is 31.38 kg/m.  Sigmoid diverticulosis. One of the nursing note mentions that the patient would like antibiotics for sigmoid diverticulosis. I had an extensive discussion with the patient to understand whether he desire for antibiotic treatment or not should the condition indicate, and patient categorically say that he does not want anything that prolongs his life or suffering and would like to continue comfort care. Patient was informed that also sigmoid diverticulosis does not require antibiotic.  Patient verbalized understanding. Currently comfort care.  Leukocytosis. Likely stress reaction pneumomediastinum. At present patient preferred to transition to comfort care protocol.  GI bleed. Likely diverticular in nature. Unfortunately at present, cannot continue anticoagulation due to bleeding. This puts the patient at high risk for worsening of DVT. Explained the risks to the family. Patient currently comfort care. Continue to monitor.   Goals of care conversation. Multiple conversation with the patient on 01/20/2020. Multiple conversation with the  wife as well as 2 daughters on 01/20/2020. Ongoing conversation on daily basis with the family.  Initially, patient requested to be transition to complete comfort.  "I want some compassion, I am tired".  Discussed in detail regarding what he would mean by that.  Explained the difference between symptom control, DNR/DNI, comfort approach.  Patient preferred to transition to complete comfort.  Patient was given a dose of morphine and when reevaluated after the dose after a few hours, patient remained firm with a plan to transition to complete comfort. Wife was present during this conversation.  10/10 discussed with wife regarding patient's prognosis.  Explained in detail regarding patient's current medical condition.  Showed the images of CT scan.  Multiple family members also visited the patient. Daughter had some question about looking for infection, explained that at present patient does not have any ongoing signs of infection with worsening leukocytosis or fever. while we cannot deny the possibility of him having infection at present given his preference to transition to comfort it would not change the management. Explained that during comfort he would not be checking patient's CBG or other blood work and we would not be using insulin sliding scale. Also explained why and how we will be using medicine like morphine and Ativan for symptom control. Explained to the family that given current circumstances the patient definitely does not have reserves to survive cardiac arrest.  And not doing comfort care will be going against patient's wishes.  Another conversation with patient regarding goals of care on 01/21/2020. Patient would not like any medication or procedures or interventions that prolong his life of suffering. Question was asked regarding antibiotics and patient denied use of antibiotics. Patient would like to continue with comfort care protocol. Patient thinks that current as needed bolus  regimen works and he does not require any in the infusion right now. Patient also told the RN the same that he would not like to prolong his life.  10/12 remains tachypneic tachycardic with pain 7 out of 10.  Occasional agitation noted as well.  Pills are harder to swallow.  Initiated on morphine infusion due to worsening symptom control.  On reevaluation patient appears comfortable.  Rest of the comfort care orders were also initiated to support the patient's wishes.  Diet: Comfort feeds. DVT Prophylaxis: Comfort care Place and maintain sequential compression device Start: 01/07/20 1315  Advance goals of care discussion: DNR  Family Communication: family was present at bedside, at the time of interview.  The pt provided permission to discuss medical plan with the family. Opportunity was given to ask question and all questions were answered satisfactorily.   Disposition:  Status is: Inpatient  Remains inpatient appropriate because:Inpatient level of care appropriate due to severity of illness   Dispo: The patient is from: Home              Anticipated d/c is to: Anticipating in hospital death              Anticipated d/c date is: 2 days              Patient currently is not medically stable to d/c.  Subjective: Continues to have chest pain.  Continues to have severe respiratory distress.  Using 16 mg of morphine last 24 hours.  No BM.  Minimal oral intake.  Somewhat delirious.  Physical Exam:  General: Appear in moderate distress, no Rash Cardiovascular: S1 and S2 Present, no Murmur, Respiratory: increased respiratory effort, Bilateral Air entry present and bilateral Crackles, no wheezes Abdomen: Bowel Sound present Extremities: no Pedal edema Neurology: lethargic and affect anxious. no new focal deficit Gait not checked due to patient safety concerns  Vitals:   01/22/20 0955 01/22/20 1048 01/22/20 1049 01/22/20 1300  BP:  103/70    Pulse:  (!) 120    Resp:  (!) 32    Temp:    98.3 F (36.8 C)   TempSrc:   Axillary   SpO2: (!) 84% (!) 83%  (!) 87%  Weight:      Height:        Intake/Output Summary (Last 24 hours) at 01/22/2020 1640 Last data filed at 01/22/2020 0400 Gross per 24 hour  Intake --  Output 2500 ml  Net -2500 ml   Filed Weights   01/09/2020 1626  Weight: 102.1 kg    Data Reviewed: I have personally reviewed and interpreted daily labs, tele strips, imagings as discussed above. I reviewed all nursing notes, pharmacy notes, vitals, pertinent old records I have discussed plan of care as described above with RN and patient/family.  CBC: Recent Labs  Lab 01/16/20 0517 01/17/20 0443 01/18/20 0429 01/19/20 0932 01/20/20 0523  WBC 21.9* 19.7* 24.7* 38.5* 28.1*  NEUTROABS 20.7* 18.6* 22.9* 35.8* 26.5*  HGB 17.7* 17.6* 18.2* 17.7* 17.0  HCT 52.5* 51.9 53.4* 52.4* 50.1  MCV 88.2 88.0 87.5 87.3 87.4  PLT 221 232 221 252 297   Basic Metabolic Panel: Recent Labs  Lab 01/16/20 0517 01/17/20 0443 01/18/20 0429 01/19/20 0544 01/19/20 0920 01/20/20 0523  NA 134* 133* 130*  --  129* 129*  K 4.2 4.4 4.5  --  4.5 4.9  CL 93* 90* 89*  --  88* 86*  CO2 29 30 28   --  25 30  GLUCOSE 164* 176* 158*  --  169* 88  BUN 55* 59* 57*  --  68* 59*  CREATININE 1.03 0.92 1.04  --  1.27* 1.07  CALCIUM 8.8* 9.1 9.1  --  8.5* 8.8*  MG  --   --  2.7*  2.6*  --  2.6*    Studies: No results found.  Scheduled Meds:  alfuzosin  10 mg Oral q morning - 10a   feeding supplement (GLUCERNA SHAKE)  237 mL Oral TID BM   lactulose  20 g Oral BID   methylPREDNISolone (SOLU-MEDROL) injection  40 mg Intravenous Q12H   nystatin  5 mL Oral QID   senna-docusate  1 tablet Oral BID   Continuous Infusions:  sodium chloride     morphine 2 mg/hr (01/22/20 0958)   PRN Meds: sodium chloride, acetaminophen, bisacodyl, chlorpheniramine-HYDROcodone, guaiFENesin-dextromethorphan, Ipratropium-Albuterol, LORazepam, magnesium hydroxide, metoprolol tartrate, morphine  injection, sodium chloride  Time spent: 35 minutes  Author: Berle Mull, MD [facility] 01/22/2020 4:40 PM  To reach On-call, see care teams to locate the attending and reach out via [email or web address]. Between 7PM-7AM, please contact night-coverage If you still have difficulty reaching the attending provider, please page the [facility] (Director on Call) for [facility] on amion for assistance.

## 2020-01-22 NOTE — TOC Progression Note (Signed)
Transition of Care [facility]) - Progression Note    Patient Details  Name: Connor [name] MRN: [id number] Date of Birth: [birth year]  [facility] CM/SW Contact  Purcell Mouton, RN Phone Number: 01/22/2020, 3:26 PM  Clinical Narrative:     Family request using [facility] in [place], [place].       Expected Discharge Plan and Services                                                 Social Determinants of Health (SDOH) Interventions    Readmission Risk Interventions No flowsheet data found.

## 2020-01-22 NOTE — Progress Notes (Signed)
RN performed AM assessment at 0720. Pt's HR was elevated in 130's and O2 sats remained 78-80% on 25L/90% FiO2. RN asked pt about his pain and pt reported he wanted pain medications. Pt appeared agitated and RN administered PRN Ativan.Pt also reports a change in his breathing that he "has noticed." RN administered patients scheduled AM Metoprolol for HR 130's and pt reported it being "harder to swallow." This RN informed pt I would consult with MD Posey Pronto about his increased need for pain management along with changing his Metoprolol to IV instead of PO.  RN notified wife of changes in condition and the pt's need for more frequent pain medication per his request. Wife reports to this RN that if that is what the patient wanted to do she would agree to it and had this RN take phone to patient for him to tell her via telephone. Pt informed wife that he wanted Morphine drip and wife agreed. Dr. Posey Pronto notified.

## 2020-01-22 NOTE — Progress Notes (Signed)
RN at bedside and stepped out in the hall with family. Both daughters and wife are going to cafeteria for lunch and report that patient told them "you need to be ready to let me go when you come back." Daughters tearful in hallway with this RN. This RN informed family that patient told me this morning that he was "exhausted and ready to go." Emotional support provided to family and patient.

## 2020-01-23 DIAGNOSIS — R531 Weakness: Secondary | ICD-10-CM

## 2020-01-23 DIAGNOSIS — G4733 Obstructive sleep apnea (adult) (pediatric): Secondary | ICD-10-CM

## 2020-01-23 DIAGNOSIS — J9601 Acute respiratory failure with hypoxia: Secondary | ICD-10-CM

## 2020-01-23 DIAGNOSIS — I82403 Acute embolism and thrombosis of unspecified deep veins of lower extremity, bilateral: Secondary | ICD-10-CM | POA: Diagnosis not present

## 2020-01-23 DIAGNOSIS — U071 COVID-19: Secondary | ICD-10-CM | POA: Diagnosis not present

## 2020-01-23 DIAGNOSIS — Z7189 Other specified counseling: Secondary | ICD-10-CM | POA: Diagnosis not present

## 2020-01-23 DIAGNOSIS — R0602 Shortness of breath: Secondary | ICD-10-CM

## 2020-01-23 DIAGNOSIS — Z515 Encounter for palliative care: Secondary | ICD-10-CM | POA: Diagnosis not present

## 2020-01-23 NOTE — Progress Notes (Signed)
PROGRESS NOTE    Connor [name]  OEV:[id number] DOB: Aug 19, 1945 DOA: 01/01/2020 PCP: Dettinger, Fransisca Kaufmann, MD    No chief complaint on file.   Brief Narrative:  74 year old male with polycythemia vera, BPH, mild COPD, OSA on CPAP at home,strong family history ofcoronary artery disease, negative stress test 2007,diagnosed with Covid on 9/16. Unvaccinated against COVID-19. 9/16 diagnosed with COVID-19 at [facility] 9/22.  Monitor antibody infusion, after treatment, oxygen was 85% so referred to ER. Admitted to hospital 9/24 started on baricitinib. 9/26 completed remdesivir. 9/27 venous Doppler positive for right leg DVT started on Xarelto.  CT PE negative for PE. 9/30 CPAP transition to BiPAP, transferred to progressive care unit. 10/6 10/8 hypoxia worsening, transition to heated high flow nasal cannula. 10/9 pneumomediastinum on CT scan. BiPAP discontinued.  Incentive spirometer discontinued. In and out catheterization x1. 10/10 patient requested to be transition to complete comfort. Patient understood the meaning of DNR/DNI and wanted to transition to DNR. Wife and family members were allowed to visit the patient. 10/11 patient had a chance to visit all his family members.  Verified that he is still wants to continue with comfort care protocol.  Verified that he does not want any measures that will prolong his life including antibiotics. 10/12 due to persistent tachypnea and tachycardia with ongoing chest pain patient was started on morphine infusion at 2 mg/h.  Currently plan is continue comfort care, anticipating in hospital death.  Subjective:  He appear very weak, on heated high flow oxygen with NRB He opens eyes to voice, he attempts to follow commands Family at bedside  Assessment & Plan:   Principal Problem:   Pneumonia due to COVID-19 virus Active Problems:   Essential hypertension, benign   OSA (obstructive sleep apnea)   BPH (benign prostatic  hyperplasia)   Hyperlipidemia LDL goal <100   Acute hypoxemic respiratory failure COVID-19 pneumonia. Acute on chronic HFpEF. Complicated by pneumomediastinum seen on 01/19/2020. Sinus tachycardia likely reactive due to above  CXR: hazy bilateral peripheral opacities CT chest: 9/23 GGO, negative for PE. CT chest: 10/9 consolidation with pneumomediastinum without PE Oxygen requirement:Heated high flow at 100% FiO2 with NRB. CRP: Dropped to 0.8. Trending up to 3.0 Steroids:Currently on IV Solu-Medrol.  Continue. Remdesivir:Completed 5-day treatment course Baricitinib/Actemra(off-label use):completed14-day treatment course on 01/17/2020. The investigational nature of this medication was discussed with the patient and they choose to proceed as the potential benefits are felt to outweigh risks at this time. Diuretics.Aggressively treated with IV Lasix 40 mg twice daily. Currently using Lasix as needed. Antibiotics:Currently not indicated Vitamin C and Zinc:Continued throughout the course.  Now comfort care. Pneumomediastinum:Discussed with PCCM. Currently recommending to hold off on BiPAP as well as incentive spirometry. No further intervention needed for now.  Now comfort care.  Isolation:CT value 30.3 on a Covid teston 01/18/2020. Isolation was removed on 01/21/2020 based on clinical examination.   Overall plan:Continue comfort care measures in the hospital.  Anticipating in hospital death  Appreciate palliative care input  Bilateral DVT: Initially treated with Lovenox and now on Xarelto. CT angio chest on 01/03/2020 negative for pulmonary embolism. Repeat CT scan on 01/19/2020 also negative for pulmonary embolism. Anticoagulation discontinued due to concerning GI bleed, family would like to discuss with patient regarding resuming anticoagulation  GI bleed/constipation/sigmoid diverticulosis Patient had 4 bowel movement with blood in it X-ray abdomen on 01/18/2020  shows stool burden primarily in the ascending colon again. CT abdomen 01/19/2020 shows rectal stool burden. Improved with Dulcolax suppository  Oral thrush. Was On nystatin.  Currently not able to get oral medication consistently due to requiring high amount of oxygen supplement  Essential hypertension: Blood pressure fairly stable, anticipating drop in blood pressure with comfort measures.  Hyperlipidemia:  On Crestor. Currently on hold given comfort care protocol.  Hyperbilirubinemia. Monitor for now.  Comfort care  Mild hyponatremia. Currently diuresis on hold.  Comfort care.  BPH. Continue current regimen. In and out catheterization/Foley catheter as needed for end-of-life care.  Vitamin D deficiency history. Resumedon 10/7/2021per family request, currently on comfort care protocol.    Polycythemia Vera-JAK2 negative and chronic immune thrombocytopenia Counts currently stable. Follows up with [name].  GERD. S/P Nissen fundoplication in 3474. Currently comfort care.  Sleep apnea: Was Using BiPAP at night with100% FiO2. CPAP at home. Currently PAP therapy on hold due to pneumomediastinum  Obesity Placing the patient at high risk for poor outcome from COVID-19 pneumonia. Body mass index is 31.38 kg/m.    DVT prophylaxis: Place and maintain sequential compression device Start: 01/07/20 1315   Code Status: DNR Family Communication: Family at bedside Disposition:   Status is: Inpatient  Dispo: The patient is from: home              Anticipated d/c is to: TBD              Anticipated d/c date is: TBD              Patient currently not medically ready to discharge  Consultants:   Palliative care   Objective: Vitals:   01/23/20 0504 01/23/20 0808 01/23/20 1414 01/23/20 1500  BP: 113/77   132/78  Pulse: (!) 130 (!) 129 (!) 121 (!) 123  Resp: (!) 22 15 11  (!) 22  Temp: 97.7 F (36.5 C)   98.4 F (36.9 C)  TempSrc: Oral   Oral   SpO2: (!) 83% (!) 88% (!) 88% (!) 84%  Weight:      Height:        Intake/Output Summary (Last 24 hours) at 01/23/2020 1537 Last data filed at 01/23/2020 1400 Gross per 24 hour  Intake 886.06 ml  Output 1100 ml  Net -213.94 ml   Filed Weights   01/10/2020 1626  Weight: 102.1 kg    Examination:  General exam: Appear weak, open eyes to voice, attempt to follow commands, currently does not appear to be in no acute distress Respiratory system: Tachypnea has improved after starting morphine drip Cardiovascular system: Tachycardia Gastrointestinal system:  Normal bowel sounds heard. Central nervous system: Lethargic, opens eyes to voice, attempt to follow commands Extremities: Generalized weakness Psychiatry: Lethargic    Data Reviewed: I have personally reviewed following labs and imaging studies  CBC: Recent Labs  Lab 01/17/20 0443 01/18/20 0429 01/19/20 0932 01/20/20 0523  WBC 19.7* 24.7* 38.5* 28.1*  NEUTROABS 18.6* 22.9* 35.8* 26.5*  HGB 17.6* 18.2* 17.7* 17.0  HCT 51.9 53.4* 52.4* 50.1  MCV 88.0 87.5 87.3 87.4  PLT 232 221 252 259    Basic Metabolic Panel: Recent Labs  Lab 01/17/20 0443 01/18/20 0429 01/19/20 0544 01/19/20 0920 01/20/20 0523  NA 133* 130*  --  129* 129*  K 4.4 4.5  --  4.5 4.9  CL 90* 89*  --  88* 86*  CO2 30 28  --  25 30  GLUCOSE 176* 158*  --  169* 88  BUN 59* 57*  --  68* 59*  CREATININE 0.92 1.04  --  1.27* 1.07  CALCIUM  9.1 9.1  --  8.5* 8.8*  MG  --  2.7* 2.6*  --  2.6*    GFR: Estimated Creatinine Clearance: 71.4 mL/min (by C-G formula based on SCr of 1.07 mg/dL).  Liver Function Tests: Recent Labs  Lab 01/17/20 0443 01/18/20 0429 01/19/20 0920 01/20/20 0523  AST 22 23 30 27   ALT 43 40 31 28  ALKPHOS 68 69 66 73  BILITOT 1.5* 1.7* 1.7* 1.5*  PROT 6.1* 6.3* 5.9* 5.7*  ALBUMIN 2.7* 2.8* 2.8* 2.7*    CBG: Recent Labs  Lab 01/19/20 0724 01/19/20 1135 01/19/20 1652 01/19/20 2048 01/20/20 0756  GLUCAP 116*  211* 116* 167* 109*     Recent Results (from the past 240 hour(s))  Respiratory Panel by RT PCR (Flu A&B, Covid) - Nasopharyngeal Swab     Status: Abnormal   Collection Time: 01/18/20  4:44 PM   Specimen: Nasopharyngeal Swab  Result Value Ref Range Status   SARS Coronavirus 2 by RT PCR POSITIVE (A) NEGATIVE Final    Comment: RESULT CALLED TO, READ BACK BY AND VERIFIED WITH: HAMPTON,J RN @1824  ON 01/18/20 JACKSON,K (NOTE) SARS-CoV-2 target nucleic acids are DETECTED.  SARS-CoV-2 RNA is generally detectable in upper respiratory specimens  during the acute phase of infection. Positive results are indicative of the presence of the identified virus, but do not rule out bacterial infection or co-infection with other pathogens not detected by the test. Clinical correlation with patient history and other diagnostic information is necessary to determine patient infection status. The expected result is Negative.  Fact Sheet for Patients:  PinkCheek.be  Fact Sheet for Healthcare Providers: GravelBags.it  This test is not yet approved or cleared by the [place] FDA and  has been authorized for detection and/or diagnosis of SARS-CoV-2 by FDA under an Emergency Use Authorization (EUA).  This EUA will remain in effect (meaning this test ca n be used) for the duration of  the COVID-19 declaration under Section 564(b)(1) of the Act, 21 U.S.C. section 360bbb-3(b)(1), unless the authorization is terminated or revoked sooner.      Influenza A by PCR NEGATIVE NEGATIVE Final   Influenza B by PCR NEGATIVE NEGATIVE Final    Comment: (NOTE) The Xpert Xpress SARS-CoV-2/FLU/RSV assay is intended as an aid in  the diagnosis of influenza from Nasopharyngeal swab specimens and  should not be used as a sole basis for treatment. Nasal washings and  aspirates are unacceptable for Xpert Xpress SARS-CoV-2/FLU/RSV  testing.  Fact Sheet for  Patients: PinkCheek.be  Fact Sheet for Healthcare Providers: GravelBags.it  This test is not yet approved or cleared by the [place] FDA and  has been authorized for detection and/or diagnosis of SARS-CoV-2 by  FDA under an Emergency Use Authorization (EUA). This EUA will remain  in effect (meaning this test can be used) for the duration of the  Covid-19 declaration under Section 564(b)(1) of the Act, 21  U.S.C. section 360bbb-3(b)(1), unless the authorization is  terminated or revoked. Performed at [facility], [place] [address], [place], [place] [address]          Radiology Studies: No results found.      Scheduled Meds: . alfuzosin  10 mg Oral q morning - 10a  . feeding supplement (GLUCERNA SHAKE)  237 mL Oral TID BM  . lactulose  20 g Oral BID  . methylPREDNISolone (SOLU-MEDROL) injection  40 mg Intravenous Q12H  . nystatin  5 mL Oral QID   Continuous Infusions: . sodium  chloride    . morphine 2 mg/hr (01/23/20 0420)     LOS: 21 days   Time spent: 61mins FMLA paperwork filled out per family request I reviewed all nursing notes, pharmacy notes, consultant notes,  vitals, pertinent old records  I have discussed plan of care as described above with RN , patient and family on 01/23/2020  Voice Recognition /Dragon dictation system was used to create this note, attempts have been made to correct errors. Please contact the author with questions and/or clarifications.   Florencia Reasons, MD PhD FACP [facility]  Available via Epic secure chat 7am-7pm for nonurgent issues Please page for urgent issues To page the attending provider between 7A-7P or the covering provider during after hours 7P-7A, please log into the web site [email or web address] and access using universal [place] password for that web site. If you do not have the password, please call the hospital operator.    01/23/2020,  3:37 PM

## 2020-01-23 NOTE — Consult Note (Signed)
Consultation Note Date: 01/23/2020   Patient Name: Connor [name]  DOB: December 17, 1945  MRN: [id number]  Age / Sex: 74 y.o., male  PCP: Dettinger, Fransisca Kaufmann, MD Referring Physician: Florencia Reasons, MD  Reason for Consultation: Establishing goals of care  HPI/Patient Profile: 74 y.o. male   admitted on 12/19/2019    Clinical Assessment and Goals of Care: 74 year old gentleman who lives at home with his wife of more than 50+ years in [place], [place].  At baseline, patient was independent with all activities of daily living, enjoyed playing golf, enjoyed it taking care of his family, mowing his yard and was active.  Patient follows with [name] for polycythemia vera for which he undergoes phlebotomy.  Patient also sees cardiologist for essential hypertension and dyslipidemia and strong family history of cardiac disease.  Patient also has mild COPD.  Patient unfortunately was diagnosed with COVID-19 infection after attending a funeral.  Patient was admitted to [facility] long hospital chart has been reviewed.  Hospital course complicated by pneumomediastinum seen on CT scan.  DNR and maintained currently on comfort measures with morphine drip at 2 mg an hour.  Palliative consult for additional support has been requested.  Patient is resting in bed.  His wife and daughter present at the bedside.  Another daughter arrived later on as well.  Patient is awake, reasonably alert.  He is being provided oral care by his family with mouth swabs, is able to take in a few sips of liquids as well.  Palliative medicine is specialized medical care for people living with serious illness. It focuses on providing relief from the symptoms and stress of a serious illness. The goal is to improve quality of life for both the patient and the family.  Goals of care: Broad aims of medical therapy in relation to the patient's values and  preferences. Our aim is to provide medical care aimed at enabling patients to achieve the goals that matter most to them, given the circumstances of their particular medical situation and their constraints.   Discussed the purpose of palliative visit being to offer additional support, to address broad goals of care and to provide communication and empathic way.  We reviewed the scope of current hospitalization.  We discussed extensively about CODE STATUS.  Differences between full code and DNR were discussed.  We talked about concept of comfort measures only.  We talked about current treatments.  NEXT OF KIN Wife, 2 daughters.  Patient has 3 children total.  SUMMARY OF RECOMMENDATIONS   DNR Continue morphine drip Possibly look into resuming some form of anticoagulation for treatment of right leg DVT, patient reportedly with some blood per rectum episodes, risk-benefit of anticoagulation versus GI bleed discussed. Continue to offer supportive care, therapeutic and compassionate presence.  Appreciate bedside RN assistance.  Request chaplain follow-up as well.  Discussed with TRH MD as well as TOC colleagues.  Thank you for the consult. Code Status/Advance Care Planning:  DNR    Symptom Management:    noted  Palliative Prophylaxis:   Delirium Protocol      Psycho-social/Spiritual:   Desire for further Chaplaincy support:yes  Additional Recommendations: Caregiving  Support/Resources  Prognosis:   Unable to determine  Discharge Planning: To Be Determined      Primary Diagnoses: Present on Admission: . Essential hypertension, benign . OSA (obstructive sleep apnea) . BPH (benign prostatic hyperplasia) . Hyperlipidemia LDL goal <100 . Pneumonia due to COVID-19 virus   I have reviewed the medical record, interviewed the patient and family, and examined the patient. The following aspects are pertinent.  Past Medical History:  Diagnosis Date  . Anemia   . Arthritis    . Back pain   . BPH (benign prostatic hyperplasia)   . Cancer (HCC)    squamous cell on nose , inside nose   . Cardiomegaly   . Carotid artery stenosis   . COPD, mild ([place]) 05/26/2007   PFT 09/09/11>>FEV1 3.06 (95%), FEV1% 69, FEF 25-75% 1.70 (59%), TLC 7.67 (109%), DLCO 108%, no BD    . Cough 05/26/2007  . Edema, lower extremity   . Erectile dysfunction   . GERD (gastroesophageal reflux disease)   . H/O vitamin D deficiency   . History of nuclear stress test    this revealed normal myocardial perfusion and function. Post stress EF was 57%. There was no region of scar or ischemia.  [name] Hx of echocardiogram 02/15/2012   This showed mild concentric LVH. EF > 55%. H edid have grade 1 diastolic dysfunction with mitral valve E:A ratio of 0.81, his left atrium was mildly dilated by volume assessment at 33.3 mL/m2. He did have mild tricupid regurgitation with upper normal RV systolic pressure at 91MB. He had aortic valve sclerosis without stenosis.  . Hyperlipemia   . Hypertension   . Iron deficiency anemia, unspecified 09/01/2012  . Iron malabsorption 05/28/2016  . Joint pain   . Nephrolithiasis    stones   . Peyronie's disease   . Polycythemia vera(238.4) 04/19/2013  . Shortness of breath    with exertion   . Sleep apnea    Cpap-   . Thrombocytopenia ([place])    Social History   Socioeconomic History  . Marital status: Married    Spouse name: Hoyle Sauer   . Number of children: 3  . Years of education: 70  . Highest education level: 12th grade  Occupational History  . Occupation: retired    Fish farm manager: RETIRED    Comment: [employer]  Tobacco Use  . Smoking status: Former Smoker    Packs/day: 0.25    Years: 11.00    Pack years: 2.75    Types: Cigarettes    Start date: 05/17/1958    Quit date: 04/12/1966    Years since quitting: 53.8  . Smokeless tobacco: Never Used  . Tobacco comment: quit smoking in 1968  Vaping Use  . Vaping Use: Never used  Substance and Sexual Activity  .  Alcohol use: Yes    Alcohol/week: 0.0 standard drinks    Comment: seldom  . Drug use: No  . Sexual activity: Not Currently  Other Topics Concern  . Not on file  Social History Narrative   Right handed    Live with wife   Social Determinants of Health   Financial Resource Strain: Low Risk   . Difficulty of Paying Living Expenses: Not hard at all  Food Insecurity: No Food Insecurity  . Worried About Charity fundraiser in the Last Year: Never true  . Ran  Out of Food in the Last Year: Never true  Transportation Needs: No Transportation Needs  . Lack of Transportation (Medical): No  . Lack of Transportation (Non-Medical): No  Physical Activity: Sufficiently Active  . Days of Exercise per Week: 5 days  . Minutes of Exercise per Session: 30 min  Stress: No Stress Concern Present  . Feeling of Stress : Not at all  Social Connections: Socially Integrated  . Frequency of Communication with Friends and Family: More than three times a week  . Frequency of Social Gatherings with Friends and Family: More than three times a week  . Attends Religious Services: More than 4 times per year  . Active Member of Clubs or Organizations: Yes  . Attends Archivist Meetings: More than 4 times per year  . Marital Status: Married   Family History  Problem Relation Age of Onset  . Heart disease Mother   . Diabetes Mother   . Hypertension Mother   . Obesity Mother   . Heart disease Father   . Hypertension Father   . Alcoholism Father   . Obesity Father   . Heart disease Sister   . Arthritis Sister   . Diabetes Brother   . Hypertension Brother   . Heart attack Brother        enlarged heart  . Liver cancer Paternal Grandfather   . Throat cancer Maternal Grandfather   . Hemachromatosis Daughter   . Arthritis Daughter   . GI problems Daughter   . Thyroid disease Daughter   . Hypertension Daughter   . Heart disease Paternal Grandmother   . Thyroid disease Daughter        pituitary  tumor  . Migraines Daughter   . Colon cancer Neg Hx   . Esophageal cancer Neg Hx   . Rectal cancer Neg Hx   . Stomach cancer Neg Hx    Scheduled Meds: . alfuzosin  10 mg Oral q morning - 10a  . feeding supplement (GLUCERNA SHAKE)  237 mL Oral TID BM  . lactulose  20 g Oral BID  . methylPREDNISolone (SOLU-MEDROL) injection  40 mg Intravenous Q12H  . nystatin  5 mL Oral QID   Continuous Infusions: . sodium chloride    . morphine 2 mg/hr (01/23/20 0420)   PRN Meds:.sodium chloride, acetaminophen **OR** acetaminophen, albuterol, bisacodyl, chlorpheniramine-HYDROcodone, diphenhydrAMINE, glycopyrrolate **OR** glycopyrrolate **OR** glycopyrrolate, guaiFENesin-dextromethorphan, haloperidol **OR** haloperidol **OR** haloperidol lactate, LORazepam **OR** LORazepam **OR** LORazepam, LORazepam, magnesium hydroxide, metoprolol tartrate, morphine injection, ondansetron **OR** ondansetron (ZOFRAN) IV, sodium chloride Medications Prior to Admission:  Prior to Admission medications   Medication Sig Start Date End Date Taking? Authorizing Provider  alfuzosin (UROXATRAL) 10 MG 24 hr tablet Take 10 mg by mouth every morning.    Yes [provider]  Ascorbic Acid (VITAMIN C) 100 MG tablet Take 100 mg by mouth daily.   Yes [provider]  aspirin 81 MG tablet Take 81 mg by mouth 2 (two) times daily.    Yes [provider]  Black Elderberry 50 MG/5ML SYRP Take by mouth daily. 2 tablespoons.   Yes [provider]  cetirizine (ZYRTEC) 10 MG tablet Take 10 mg by mouth daily.   Yes [provider]  Coenzyme Q10 (CO Q 10 PO) Take 1 capsule by mouth daily.    Yes [provider]  cyclobenzaprine (FLEXERIL) 10 MG tablet TAKE ONE TABLET BY MOUTH THREE TIMES DAILY AS NEEDED FOR MUSCLE SPASMS Patient taking differently: Take 10 mg by  mouth 3 (three) times daily as needed for muscle spasms.  10/22/19  Yes Dettinger, Fransisca Kaufmann, MD  Esomeprazole Magnesium (NEXIUM 24HR)  20 MG TBEC Take 20 mg by mouth daily as needed.    Yes [provider]  ferrous sulfate 325 (65 FE) MG tablet Take 1 tablet (325 mg total) by mouth daily with breakfast. 09/20/18  Yes [name], MD  Glucosamine-Chondroit-Vit C-Mn (GLUCOSAMINE CHONDR 1500 COMPLX) CAPS Take 1 capsule by mouth daily.    Yes [provider]  Ipratropium-Albuterol (COMBIVENT) 20-100 MCG/ACT AERS respimat Inhale 1 puff into the lungs as needed for wheezing or shortness of breath. 09/20/18  Yes [name], MD  losartan (COZAAR) 50 MG tablet Take 1 tablet (50 mg total) by mouth daily. Patient taking differently: Take 25 mg by mouth daily.  09/20/18  Yes [name], MD  montelukast (SINGULAIR) 10 MG tablet TAKE ONE TABLET BY MOUTH AT BEDTIME 10/30/19  Yes Dettinger, Fransisca Kaufmann, MD  Multiple Vitamins-Minerals (CENTRUM SILVER ULTRA MENS) TABS Take 1 tablet by mouth every morning. Once a day   Yes [provider]  Probiotic Product ([place]) Take 1 capsule by mouth daily.    Yes [provider]  pyridOXINE (VITAMIN B-6) 100 MG tablet Take 100 mg by mouth daily.    Yes [provider]  Turmeric 500 MG CAPS Take 1 tablet by mouth daily.    Yes [provider]  vitamin B-12 (CYANOCOBALAMIN) 500 MCG tablet Take 500 mcg by mouth daily.   Yes [provider]  Zinc 50 MG TABS Take by mouth daily.   Yes [provider]  rosuvastatin (CRESTOR) 10 MG tablet TAKE ONE TABLET BY MOUTH DAILY 01/21/20   Dettinger, Fransisca Kaufmann, MD  sildenafil (REVATIO) 20 MG tablet Take 2-5 tabs prior to sexual activity Patient taking differently: Take 20 mg by mouth 3 (three) times daily as needed (For sexual activity).  12/31/15   [name], MD  Vitamin D, Ergocalciferol, (DRISDOL) 1.25 MG (50000 UNIT) CAPS capsule Take 1 capsule (50,000 Units total) by mouth every 7 (seven) days. 11/26/19   Mellody Dance, DO   Allergies  Allergen Reactions  .  Dexlansoprazole Nausea And Vomiting  . Celecoxib Other (See Comments)    Solid white - generic pill only = caused diarrhea (other generics ok)  Solid white - generic pill only = caused diarrhea (other generics ok)    Review of Systems Appears weak   Physical Exam Weak appearing gentleman No distress O2 requirements noted S1 S 2 Breath sounds are diminished at bases Abdomen not distended Trace edema Extremities warm to touch, no coolness no mottling.   Vital Signs: BP 113/77 (BP Location: Left Arm)   Pulse (!) 129   Temp 97.7 F (36.5 C) (Oral)   Resp 15   Ht 5\' 11"  (1.803 m)   Wt 102.1 kg   SpO2 (!) 88%   BMI 31.38 kg/m  Pain Scale: 0-10 POSS *See Group Information*: 1-Acceptable,Awake and alert Pain Score: 3    SpO2: SpO2: (!) 88 % O2 Device:SpO2: (!) 88 % O2 Flow Rate: .O2 Flow Rate (L/min): 35 L/min  IO: Intake/output summary:   Intake/Output Summary (Last 24 hours) at 01/23/2020 1247 Last data filed at 01/23/2020 0420 Gross per 24 hour  Intake 386.72 ml  Output 1100 ml  Net -713.28 ml    LBM: Last BM Date: 01/21/20 Baseline Weight: Weight: 102.1 kg Most recent weight: Weight: 102.1 kg  Palliative Assessment/Data:   PPS 20%  Time In:  10 Time Out:  11.10 Time Total:  70 min.  Greater than 50%  of this time was spent counseling and coordinating care related to the above assessment and plan.  Signed by: Loistine Chance, MD   Please contact Palliative Medicine Team phone at [phone] for questions and concerns.  For individual provider: See Shea Evans

## 2020-01-23 NOTE — Progress Notes (Signed)
RN at bedside for AM assessment. Pt awake with wife at bedside and pt is A&O x 4. Pt reports pain 2-3/10 and is resting comfortably with no needs reported at this time. Wife turned off light for patient to go back to sleep.

## 2020-01-23 NOTE — TOC Progression Note (Signed)
Transition of Care [facility]) - Progression Note    Patient Details  Name: Connor [name] MRN: [id number] Date of Birth: 11-Feb-1946  Transition of [facility]) CM/SW Contact  Purcell Mouton, RN Phone Number: 01/23/2020, 3:17 PM  Clinical Narrative:    CM gave pt's wife little booklet: Gone From My Sight, The Dying Experience.         Expected Discharge Plan and Services                                                 Social Determinants of Health (SDOH) Interventions    Readmission Risk Interventions No flowsheet data found.

## 2020-01-23 NOTE — Plan of Care (Signed)
  Problem: Education: Goal: Knowledge of risk factors and measures for prevention of condition will improve Outcome: Progressing   Problem: Coping: Goal: Psychosocial and spiritual needs will be supported Outcome: Progressing   Problem: Respiratory: Goal: Will maintain a patent airway Outcome: Progressing Goal: Complications related to the disease process, condition or treatment will be avoided or minimized Outcome: Progressing   Problem: Health Behavior/Discharge Planning: Goal: Ability to manage health-related needs will improve Outcome: Progressing   Problem: Clinical Measurements: Goal: Ability to maintain clinical measurements within normal limits will improve Outcome: Progressing Goal: Will remain free from infection Outcome: Progressing Goal: Diagnostic test results will improve Outcome: Progressing Goal: Respiratory complications will improve Outcome: Progressing Goal: Cardiovascular complication will be avoided Outcome: Progressing   Problem: Activity: Goal: Risk for activity intolerance will decrease Outcome: Progressing   Problem: Nutrition: Goal: Adequate nutrition will be maintained Outcome: Progressing   Problem: Coping: Goal: Level of anxiety will decrease Outcome: Progressing   Problem: Elimination: Goal: Will not experience complications related to bowel motility Outcome: Progressing Goal: Will not experience complications related to urinary retention Outcome: Progressing   Problem: Pain Managment: Goal: General experience of comfort will improve Outcome: Progressing   Problem: Safety: Goal: Ability to remain free from injury will improve Outcome: Progressing   Problem: Skin Integrity: Goal: Risk for impaired skin integrity will decrease Outcome: Progressing   

## 2020-01-24 DIAGNOSIS — U071 COVID-19: Secondary | ICD-10-CM | POA: Diagnosis not present

## 2020-01-24 DIAGNOSIS — J1282 Pneumonia due to Coronavirus disease 2019: Secondary | ICD-10-CM | POA: Diagnosis not present

## 2020-01-24 DIAGNOSIS — Z515 Encounter for palliative care: Secondary | ICD-10-CM | POA: Diagnosis not present

## 2020-01-24 DIAGNOSIS — R0602 Shortness of breath: Secondary | ICD-10-CM | POA: Diagnosis not present

## 2020-01-24 MED ORDER — ENSURE MAX PROTEIN PO LIQD
11.0000 [oz_av] | Freq: Three times a day (TID) | ORAL | Status: DC
  Administered 2020-01-24 – 2020-01-26 (×3): 11 [oz_av] via ORAL
  Filled 2020-01-24 (×8): qty 330

## 2020-01-24 NOTE — Plan of Care (Signed)
  Problem: Education: Goal: Knowledge of risk factors and measures for prevention of condition will improve Outcome: Progressing   Problem: Coping: Goal: Psychosocial and spiritual needs will be supported Outcome: Progressing   Problem: Respiratory: Goal: Will maintain a patent airway Outcome: Progressing Goal: Complications related to the disease process, condition or treatment will be avoided or minimized Outcome: Progressing   Problem: Health Behavior/Discharge Planning: Goal: Ability to manage health-related needs will improve Outcome: Progressing   Problem: Clinical Measurements: Goal: Ability to maintain clinical measurements within normal limits will improve Outcome: Progressing Goal: Will remain free from infection Outcome: Progressing Goal: Diagnostic test results will improve Outcome: Progressing Goal: Respiratory complications will improve Outcome: Progressing Goal: Cardiovascular complication will be avoided Outcome: Progressing   Problem: Activity: Goal: Risk for activity intolerance will decrease Outcome: Progressing   Problem: Nutrition: Goal: Adequate nutrition will be maintained Outcome: Progressing   Problem: Coping: Goal: Level of anxiety will decrease Outcome: Progressing   Problem: Elimination: Goal: Will not experience complications related to bowel motility Outcome: Progressing Goal: Will not experience complications related to urinary retention Outcome: Progressing   Problem: Pain Managment: Goal: General experience of comfort will improve Outcome: Progressing   Problem: Safety: Goal: Ability to remain free from injury will improve Outcome: Progressing   Problem: Skin Integrity: Goal: Risk for impaired skin integrity will decrease Outcome: Progressing   

## 2020-01-24 NOTE — Progress Notes (Signed)
Daily Progress Note   Patient Name: Connor [name]       Date: 01/24/2020 DOB: 01-29-1946  Age: 74 y.o. MRN#: [id number] Attending Physician: Florencia Reasons, MD Primary Care Physician: Dettinger, Fransisca Kaufmann, MD Admit Date: 01/08/2020  Reason for Consultation/Follow-up: Non pain symptom management, Pain control and Psychosocial/spiritual support  Subjective: Patient is resting in bed.  Wife and daughter present at bedside.  He complains of difficulty breathing.  Remains on nonrebreather and high flow nasal cannula.  Length of Stay: 22  Current Medications: Scheduled Meds:  . alfuzosin  10 mg Oral q morning - 10a  . feeding supplement (GLUCERNA SHAKE)  237 mL Oral TID BM  . lactulose  20 g Oral BID  . methylPREDNISolone (SOLU-MEDROL) injection  40 mg Intravenous Q12H  . nystatin  5 mL Oral QID    Continuous Infusions: . sodium chloride    . morphine 2 mg/hr (01/24/20 0139)    PRN Meds: sodium chloride, acetaminophen **OR** acetaminophen, albuterol, bisacodyl, chlorpheniramine-HYDROcodone, diphenhydrAMINE, glycopyrrolate **OR** glycopyrrolate **OR** glycopyrrolate, guaiFENesin-dextromethorphan, haloperidol **OR** haloperidol **OR** haloperidol lactate, LORazepam **OR** LORazepam **OR** LORazepam, LORazepam, magnesium hydroxide, metoprolol tartrate, morphine injection, ondansetron **OR** ondansetron (ZOFRAN) IV, sodium chloride  Physical Exam         Awake, appears fatigued Diminished breath sounds towards bases S1-S2 Abdomen is not distended Extremities are cool to touch  Vital Signs: BP 140/77 (BP Location: Left Arm)   Pulse (!) 135   Temp 98.4 F (36.9 C)   Resp (!) 22   Ht 5\' 11"  (1.803 m)   Wt 102.1 kg   SpO2 (!) 85%   BMI 31.38 kg/m  SpO2: SpO2: (!) 85 % O2 Device: O2  Device: High Flow Nasal Cannula, NRB O2 Flow Rate: O2 Flow Rate (L/min): 35 L/min  Intake/output summary:   Intake/Output Summary (Last 24 hours) at 01/24/2020 1108 Last data filed at 01/24/2020 0900 Gross per 24 hour  Intake 531.33 ml  Output 2575 ml  Net -2043.67 ml   LBM: Last BM Date: 01/21/20 Baseline Weight: Weight: 102.1 kg Most recent weight: Weight: 102.1 kg       Palliative Assessment/Data:      Patient Active Problem List   Diagnosis Date Noted  . Pneumonia due to COVID-19 virus 01/08/2020  . Prediabetes 02/12/2019  . Transient alteration of awareness  02/01/2019  . Left carotid bruit 02/01/2019  . Pustule of nostril 10/20/2017  . Squamous cell cancer of skin of nose 07/01/2017  . Erectile dysfunction due to arterial insufficiency 07/01/2017  . Carcinoma of nasal cavity ([place]) 12/22/2016  . Iron malabsorption 05/28/2016  . Torus mandibularis 03/24/2016  . Hyperlipidemia LDL goal <100 04/12/2014  . Ptosis 07/19/2013  . Polycythemia vera ([place]) 04/19/2013  . Family history of early CAD 02/19/2013  . Iron deficiency anemia 09/01/2012  . H/O vitamin D deficiency 05/21/2012  . Diverticulosis of colon 05/21/2012  . Carotid artery stenosis, asymptomatic 05/21/2012  . BPH (benign prostatic hyperplasia)   . Cardiomegaly   . Thrombocytopenia ([place])   . Lap Nissen Fundoplication August 2751 11/26/2011  . Essential hypertension, benign 08/27/2009  . OSA (obstructive sleep apnea) 02/19/2008  . COPD, mild ([place]) 05/26/2007    Palliative Care Assessment & Plan   Patient Profile:   Assessment: 74 year old gentleman with polycythemia vera, BPH, mild COPD, obstructive sleep apnea on CPAP at home, family history of coronary artery disease, diagnosed with Covid on 9/16. Rested reasonably well overnight on 10-13.  At times, the patient has been telling his family members that he is very tired and that he is ready to move on. Recommendations/Plan:  Goals of care  discussions, symptom management and additional supportive care being provided by palliative follow-up in this hospitalization.  Discussed about optimizing patient's pain and nonpain symptom management, most pressing symptom at this time is dyspnea.  Discussed about judicious use of opioids for dyspnea.  Patient remains on 2 mg of morphine per hour continuous infusion.  Discussed about following the patient's physical appearance, nonverbal signs of distress or discomfort and adjusting opioids upwards if needed.   We will request dietitian for protein shakes  We discussed about comfort measures only, unrestricted visitor access.  Goals of Care and Additional Recommendations:  Limitations on Scope of Treatment: Full Comfort Care  Code Status:    Code Status Orders  (From admission, onward)         Start     Ordered   01/22/20 1659  Do not attempt resuscitation (DNR)  Continuous       Question Answer Comment  In the event of cardiac or respiratory ARREST Do not call a "code blue"   In the event of cardiac or respiratory ARREST Do not perform Intubation, CPR, defibrillation or ACLS   In the event of cardiac or respiratory ARREST Use medication by any route, position, wound care, and other measures to relive pain and suffering. May use oxygen, suction and manual treatment of airway obstruction as needed for comfort.      01/22/20 1700        Code Status History    Date Active Date Inactive Code Status Order ID Comments User Context   01/20/2020 1136 01/22/2020 1700 DNR [id number]  Lavina Hamman, MD Inpatient   12/12/2019 2257 01/20/2020 1136 Full Code [id number]  Neena Rhymes, MD ED   Advance Care Planning Activity       Prognosis:   < 2 weeks  Discharge Planning:  Anticipated Hospital Death  Care plan was discussed with patient, wife, daughter, respiratory therapist  Thank you for allowing the Palliative Medicine Team to assist in the care of this patient.   Time In:   8 Time Out:  8.35 Total Time  35 Prolonged Time Billed  no       Greater than 50%  of this time was spent counseling  and coordinating care related to the above assessment and plan.  Loistine Chance, MD  Please contact Palliative Medicine Team phone at [phone] for questions and concerns.

## 2020-01-24 NOTE — Progress Notes (Signed)
PROGRESS NOTE    Connor [name]  GYK:[id number] DOB: January 13, 1946 DOA: 01/09/2020 PCP: Dettinger, Fransisca Kaufmann, MD    No chief complaint on file.   Brief Narrative:  74 year old male with polycythemia vera, BPH, mild COPD, OSA on CPAP at home,strong family history ofcoronary artery disease, negative stress test 2007,diagnosed with Covid on 9/16. Unvaccinated against COVID-19. 9/16 diagnosed with COVID-19 at [facility] 9/22.  Monitor antibody infusion, after treatment, oxygen was 85% so referred to ER. Admitted to hospital 9/24 started on baricitinib. 9/26 completed remdesivir. 9/27 venous Doppler positive for right leg DVT started on Xarelto.  CT PE negative for PE. 9/30 CPAP transition to BiPAP, transferred to progressive care unit. 10/6 10/8 hypoxia worsening, transition to heated high flow nasal cannula. 10/9 pneumomediastinum on CT scan. BiPAP discontinued.  Incentive spirometer discontinued. In and out catheterization x1. 10/10 patient requested to be transition to complete comfort. Patient understood the meaning of DNR/DNI and wanted to transition to DNR. Wife and family members were allowed to visit the patient. 10/11 patient had a chance to visit all his family members.  Verified that he is still wants to continue with comfort care protocol.  Verified that he does not want any measures that will prolong his life including antibiotics. 10/12 due to persistent tachypnea and tachycardia with ongoing chest pain patient was started on morphine infusion at 2 mg/h.  Currently plan is continue comfort care, anticipating in hospital death.  Subjective:  He appears very weak, on heated high flow oxygen with NRB Wife, daughter and granddaughter at bedside, he opens his eyes and trying to answer questions by shaking /nodding head Family reports patient report being short of breath earlier this morning, currently he is shaking head denies feeling short of breath He does not  appear in pain   Assessment & Plan:   Principal Problem:   Pneumonia due to COVID-19 virus Active Problems:   Essential hypertension, benign   OSA (obstructive sleep apnea)   BPH (benign prostatic hyperplasia)   Hyperlipidemia LDL goal <100   Acute hypoxemic respiratory failure COVID-19 pneumonia. Acute on chronic HFpEF. Complicated by pneumomediastinum seen on 01/19/2020. Sinus tachycardia likely reactive due to above  CXR: hazy bilateral peripheral opacities CT chest: 9/23 GGO, negative for PE. CT chest: 10/9 consolidation with pneumomediastinum without PE Oxygen requirement:Heated high flow at 100% FiO2 with NRB. CRP: Dropped to 0.8. Trending up to 3.0 Steroids:Currently on IV Solu-Medrol.  Continue. Remdesivir:Completed 5-day treatment course Baricitinib/Actemra(off-label use):completed14-day treatment course on 01/17/2020. The investigational nature of this medication was discussed with the patient and they choose to proceed as the potential benefits are felt to outweigh risks at this time. Diuretics.Aggressively treated with IV Lasix 40 mg twice daily. Currently using Lasix as needed. Antibiotics:Currently not indicated Vitamin C and Zinc:Continued throughout the course.  Now comfort care. Pneumomediastinum:Discussed with PCCM. Currently recommending to hold off on BiPAP as well as incentive spirometry. No further intervention needed for now.  Now comfort care.  Isolation:CT value 30.3 on a Covid teston 01/18/2020. Isolation was removed on 01/21/2020 based on clinical examination.   Overall plan:Continue comfort care measures in the hospital.  Anticipating in hospital death  Appreciate palliative care input  Bilateral DVT: Initially treated with Lovenox and now on Xarelto. CT angio chest on 01/03/2020 negative for pulmonary embolism. Repeat CT scan on 01/19/2020 also negative for pulmonary embolism. Anticoagulation discontinued due to concerning GI  bleed, family would like to discuss with patient regarding resuming anticoagulation  GI bleed/constipation/sigmoid  diverticulosis Patient had 4 bowel movement with blood in it X-ray abdomen on 01/18/2020 shows stool burden primarily in the ascending colon again. CT abdomen 01/19/2020 shows rectal stool burden. Improved with Dulcolax suppository  Oral thrush. Was On nystatin.  Currently not able to get oral medication consistently due to requiring high amount of oxygen supplement  Essential hypertension: Blood pressure fairly stable, anticipating drop in blood pressure with comfort measures.  Hyperlipidemia:  On Crestor. Currently on hold given comfort care protocol.  Hyperbilirubinemia. Monitor for now.  Comfort care  Mild hyponatremia. Currently diuresis on hold.  Comfort care.  BPH. Continue current regimen. In and out catheterization/Foley catheter as needed for end-of-life care.  Vitamin D deficiency history. Resumedon 10/7/2021per family request, currently on comfort care protocol.    Polycythemia Vera-JAK2 negative and chronic immune thrombocytopenia Counts currently stable. Follows up with [name].  GERD. S/P Nissen fundoplication in 4174. Currently comfort care.  Sleep apnea: Was Using BiPAP at night with100% FiO2. CPAP at home. Currently PAP therapy on hold due to pneumomediastinum  Obesity Placing the patient at high risk for poor outcome from COVID-19 pneumonia. Body mass index is 31.38 kg/m.  Family has questions regarding visitation restrictions, made charge nurse /manager aware who is going to assist.   DVT prophylaxis: Place and maintain sequential compression device Start: 01/07/20 1315   Code Status: DNR Family Communication: Family at bedside Disposition:   Status is: Inpatient  Dispo: The patient is from: home              Anticipated d/c is to: TBD              Anticipated d/c date is: TBD              Patient  currently not medically ready to discharge  Consultants:   Palliative care   Objective: Vitals:   01/24/20 0356 01/24/20 0557 01/24/20 1242 01/24/20 1455  BP:  140/77 124/75   Pulse: (!) 122 (!) 135 (!) 130   Resp:  (!) 22    Temp:  98.4 F (36.9 C) 97.8 F (36.6 C)   TempSrc:   Oral   SpO2: (!) 86% (!) 85% (!) 86% (!) 87%  Weight:      Height:        Intake/Output Summary (Last 24 hours) at 01/24/2020 1633 Last data filed at 01/24/2020 0900 Gross per 24 hour  Intake 31.99 ml  Output 1550 ml  Net -1518.01 ml   Filed Weights   12/23/2019 1626  Weight: 102.1 kg    Examination:  General exam: Appear weak, awake and trying to communicating via gesture. currently does not appear to be in no acute distress Respiratory system: Tachypnea has improved after starting morphine drip Cardiovascular system: Tachycardia Gastrointestinal system:  Normal bowel sounds heard. Central nervous system: Alert awake today Extremities: Generalized weakness Psychiatry: Alert awake today    Data Reviewed: I have personally reviewed following labs and imaging studies  CBC: Recent Labs  Lab 01/18/20 0429 01/19/20 0932 01/20/20 0523  WBC 24.7* 38.5* 28.1*  NEUTROABS 22.9* 35.8* 26.5*  HGB 18.2* 17.7* 17.0  HCT 53.4* 52.4* 50.1  MCV 87.5 87.3 87.4  PLT 221 252 081    Basic Metabolic Panel: Recent Labs  Lab 01/18/20 0429 01/19/20 0544 01/19/20 0920 01/20/20 0523  NA 130*  --  129* 129*  K 4.5  --  4.5 4.9  CL 89*  --  88* 86*  CO2 28  --  25  30  GLUCOSE 158*  --  169* 88  BUN 57*  --  68* 59*  CREATININE 1.04  --  1.27* 1.07  CALCIUM 9.1  --  8.5* 8.8*  MG 2.7* 2.6*  --  2.6*    GFR: Estimated Creatinine Clearance: 71.4 mL/min (by C-G formula based on SCr of 1.07 mg/dL).  Liver Function Tests: Recent Labs  Lab 01/18/20 0429 01/19/20 0920 01/20/20 0523  AST 23 30 27   ALT 40 31 28  ALKPHOS 69 66 73  BILITOT 1.7* 1.7* 1.5*  PROT 6.3* 5.9* 5.7*  ALBUMIN 2.8*  2.8* 2.7*    CBG: Recent Labs  Lab 01/19/20 0724 01/19/20 1135 01/19/20 1652 01/19/20 2048 01/20/20 0756  GLUCAP 116* 211* 116* 167* 109*     Recent Results (from the past 240 hour(s))  Respiratory Panel by RT PCR (Flu A&B, Covid) - Nasopharyngeal Swab     Status: Abnormal   Collection Time: 01/18/20  4:44 PM   Specimen: Nasopharyngeal Swab  Result Value Ref Range Status   SARS Coronavirus 2 by RT PCR POSITIVE (A) NEGATIVE Final    Comment: RESULT CALLED TO, READ BACK BY AND VERIFIED WITH: HAMPTON,J RN @1824  ON 01/18/20 JACKSON,K (NOTE) SARS-CoV-2 target nucleic acids are DETECTED.  SARS-CoV-2 RNA is generally detectable in upper respiratory specimens  during the acute phase of infection. Positive results are indicative of the presence of the identified virus, but do not rule out bacterial infection or co-infection with other pathogens not detected by the test. Clinical correlation with patient history and other diagnostic information is necessary to determine patient infection status. The expected result is Negative.  Fact Sheet for Patients:  PinkCheek.be  Fact Sheet for Healthcare Providers: GravelBags.it  This test is not yet approved or cleared by the [place] FDA and  has been authorized for detection and/or diagnosis of SARS-CoV-2 by FDA under an Emergency Use Authorization (EUA).  This EUA will remain in effect (meaning this test ca n be used) for the duration of  the COVID-19 declaration under Section 564(b)(1) of the Act, 21 U.S.C. section 360bbb-3(b)(1), unless the authorization is terminated or revoked sooner.      Influenza A by PCR NEGATIVE NEGATIVE Final   Influenza B by PCR NEGATIVE NEGATIVE Final    Comment: (NOTE) The Xpert Xpress SARS-CoV-2/FLU/RSV assay is intended as an aid in  the diagnosis of influenza from Nasopharyngeal swab specimens and  should not be used as a sole basis for  treatment. Nasal washings and  aspirates are unacceptable for Xpert Xpress SARS-CoV-2/FLU/RSV  testing.  Fact Sheet for Patients: PinkCheek.be  Fact Sheet for Healthcare Providers: GravelBags.it  This test is not yet approved or cleared by the [place] FDA and  has been authorized for detection and/or diagnosis of SARS-CoV-2 by  FDA under an Emergency Use Authorization (EUA). This EUA will remain  in effect (meaning this test can be used) for the duration of the  Covid-19 declaration under Section 564(b)(1) of the Act, 21  U.S.C. section 360bbb-3(b)(1), unless the authorization is  terminated or revoked. Performed at [facility], [place] [address], [place], [place] [address]          Radiology Studies: No results found.      Scheduled Meds: . alfuzosin  10 mg Oral q morning - 10a  . feeding supplement (GLUCERNA SHAKE)  237 mL Oral TID BM  . lactulose  20 g Oral BID  . methylPREDNISolone (SOLU-MEDROL) injection  40 mg Intravenous  Q12H  . nystatin  5 mL Oral QID  . Ensure Max Protein  11 oz Oral TID BM   Continuous Infusions: . sodium chloride    . morphine 2 mg/hr (01/24/20 0139)     LOS: 22 days   Time spent: 45mins FMLA paperwork filled out per family request I reviewed all nursing notes, pharmacy notes, consultant notes,  vitals, pertinent old records  I have discussed plan of care as described above with RN , patient and family on 01/24/2020  Voice Recognition /Dragon dictation system was used to create this note, attempts have been made to correct errors. Please contact the author with questions and/or clarifications.   Florencia Reasons, MD PhD FACP [facility]  Available via Epic secure chat 7am-7pm for nonurgent issues Please page for urgent issues To page the attending provider between 7A-7P or the covering provider during after hours 7P-7A, please log into the web site  [email or web address] and access using universal [place] password for that web site. If you do not have the password, please call the hospital operator.    01/24/2020, 4:33 PM

## 2020-01-24 NOTE — Progress Notes (Signed)
NUTRITION NOTE RD working remotely.  Consult received for patient on comfort measures but family requesting Premier Protein shakes for patient.  Will order TID, each supplement provides 160 kcal and 30 grams protein.   No additional nutrition-related needs noted at this time. If additional needs arise, please re-consult RD.    Jarome Matin, MS, RD, LDN, CNSC Inpatient Clinical Dietitian RD pager # available in [place]  After hours/weekend pager # available in [facility]

## 2020-01-25 DIAGNOSIS — U071 COVID-19: Secondary | ICD-10-CM | POA: Diagnosis not present

## 2020-01-25 DIAGNOSIS — J1282 Pneumonia due to Coronavirus disease 2019: Secondary | ICD-10-CM | POA: Diagnosis not present

## 2020-01-25 DIAGNOSIS — R0602 Shortness of breath: Secondary | ICD-10-CM | POA: Diagnosis not present

## 2020-01-25 DIAGNOSIS — Z515 Encounter for palliative care: Secondary | ICD-10-CM | POA: Diagnosis not present

## 2020-01-25 DIAGNOSIS — R531 Weakness: Secondary | ICD-10-CM | POA: Diagnosis not present

## 2020-01-25 NOTE — Progress Notes (Signed)
Daily Progress Note   Patient Name: Connor [name]       Date: 01/25/2020 DOB: 1945/08/31  Age: 74 y.o. MRN#: [id number] Attending Physician: Florencia Reasons, MD Primary Care Physician: Dettinger, Fransisca Kaufmann, MD Admit Date: 12/18/2019  Reason for Consultation/Follow-up: Non pain symptom management, Pain control and Psychosocial/spiritual support  Subjective: Patient is resting in bed. Daughter present at bedside.  He is trying to say something, but is not able to express. At times, the patient has been having visions of his relatives, he is also with some restless movements episodically. Minimally taking in some sips of liquids. Remains on nonrebreather and high flow nasal cannula. Remains on 2 mg Morphine continuous infusion. O2 saturations currently 86%.   Length of Stay: 23  Current Medications: Scheduled Meds:  . alfuzosin  10 mg Oral q morning - 10a  . feeding supplement (GLUCERNA SHAKE)  237 mL Oral TID BM  . lactulose  20 g Oral BID  . methylPREDNISolone (SOLU-MEDROL) injection  40 mg Intravenous Q12H  . nystatin  5 mL Oral QID  . Ensure Max Protein  11 oz Oral TID BM    Continuous Infusions: . sodium chloride    . morphine 2 mg/hr (01/24/20 0139)    PRN Meds: sodium chloride, acetaminophen **OR** acetaminophen, albuterol, bisacodyl, chlorpheniramine-HYDROcodone, diphenhydrAMINE, glycopyrrolate **OR** glycopyrrolate **OR** glycopyrrolate, guaiFENesin-dextromethorphan, haloperidol **OR** haloperidol **OR** haloperidol lactate, LORazepam **OR** LORazepam **OR** LORazepam, LORazepam, magnesium hydroxide, metoprolol tartrate, morphine injection, ondansetron **OR** ondansetron (ZOFRAN) IV, sodium chloride  Physical Exam         Awake, appears fatigued Diminished breath sounds towards  bases S1-S2 Abdomen is not distended Extremities are cool to touch  Vital Signs: BP 127/74 (BP Location: Right Arm)   Pulse (!) 143   Temp 98 F (36.7 C) (Oral)   Resp (!) 27   Ht 5\' 11"  (1.803 m)   Wt 102.1 kg   SpO2 (!) 87%   BMI 31.38 kg/m  SpO2: SpO2: (!) 87 % O2 Device: O2 Device: High Flow Nasal Cannula, NRB O2 Flow Rate: O2 Flow Rate (L/min): 35 L/min  Intake/output summary:   Intake/Output Summary (Last 24 hours) at 01/25/2020 1416 Last data filed at 01/25/2020 0430 Gross per 24 hour  Intake 22 ml  Output 1850 ml  Net -1828 ml   LBM: Last BM  Date: 01/21/20 Baseline Weight: Weight: 102.1 kg Most recent weight: Weight: 102.1 kg       Palliative Assessment/Data:      Patient Active Problem List   Diagnosis Date Noted  . Pneumonia due to COVID-19 virus 01/04/2020  . Prediabetes 02/12/2019  . Transient alteration of awareness 02/01/2019  . Left carotid bruit 02/01/2019  . Pustule of nostril 10/20/2017  . Squamous cell cancer of skin of nose 07/01/2017  . Erectile dysfunction due to arterial insufficiency 07/01/2017  . Carcinoma of nasal cavity ([place]) 12/22/2016  . Iron malabsorption 05/28/2016  . Torus mandibularis 03/24/2016  . Hyperlipidemia LDL goal <100 04/12/2014  . Ptosis 07/19/2013  . Polycythemia vera ([place]) 04/19/2013  . Family history of early CAD 02/19/2013  . Iron deficiency anemia 09/01/2012  . H/O vitamin D deficiency 05/21/2012  . Diverticulosis of colon 05/21/2012  . Carotid artery stenosis, asymptomatic 05/21/2012  . BPH (benign prostatic hyperplasia)   . Cardiomegaly   . Thrombocytopenia ([place])   . Lap Nissen Fundoplication August 9741 11/26/2011  . Essential hypertension, benign 08/27/2009  . OSA (obstructive sleep apnea) 02/19/2008  . COPD, mild ([place]) 05/26/2007    Palliative Care Assessment & Plan   Patient Profile:   Assessment: 74 year old gentleman with polycythemia vera, BPH, mild COPD, obstructive sleep apnea on CPAP  at home, family history of coronary artery disease, diagnosed with Covid on 9/16. Rested reasonably well overnight on 10-13.  At times, the patient has been telling his family members that he is very tired and that he is ready to move on. Recommendations/Plan:  Goals of care discussions, symptom management and additional supportive care being provided by palliative follow-up in this hospitalization.  Discussed about optimizing patient's pain and nonpain symptom management, discussed today about the possibility of the patient needing benzodiazepines for anxiety/air hunger over the weekend, depending on his overall disease trajectory. Discussed about synergistic effect of opioids and benzodiazepines in providing comfort.   Patient's daughter wished to have conversation outside the patient's room towards the end of my visit, we talked about his ongoing decline, continuation of comfort measures and for [place] to prioritize pain and non pain symptom management, with primary goal being avoidance of suffering.   Goals of Care and Additional Recommendations:  Limitations on Scope of Treatment: Full Comfort Care  Code Status:    Code Status Orders  (From admission, onward)         Start     Ordered   01/22/20 1659  Do not attempt resuscitation (DNR)  Continuous       Question Answer Comment  In the event of cardiac or respiratory ARREST Do not call a "code blue"   In the event of cardiac or respiratory ARREST Do not perform Intubation, CPR, defibrillation or ACLS   In the event of cardiac or respiratory ARREST Use medication by any route, position, wound care, and other measures to relive pain and suffering. May use oxygen, suction and manual treatment of airway obstruction as needed for comfort.      01/22/20 1700        Code Status History    Date Active Date Inactive Code Status Order ID Comments User Context   01/20/2020 1136 01/22/2020 1700 DNR [id number]  Lavina Hamman, MD Inpatient    12/19/2019 2257 01/20/2020 1136 Full Code [id number]  Neena Rhymes, MD ED   Advance Care Planning Activity       Prognosis:   < 2 weeks Could be as short as  few hours, as patient with declining O2 saturations, in spite of high O2 requirements currently.  Discharge Planning:  Anticipated Hospital Death  Care plan was discussed with patient and daughter. Also curb side discussed with [facility] MD Dr. Erlinda Hong.   Thank you for allowing the Palliative Medicine Team to assist in the care of this patient.   Time In: 1330 Time Out: 1355 Total Time 25 Prolonged Time Billed  no       Greater than 50%  of this time was spent counseling and coordinating care related to the above assessment and plan.  Loistine Chance, MD  Please contact Palliative Medicine Team phone at [phone] for questions and concerns.

## 2020-01-25 NOTE — Plan of Care (Signed)
  Problem: Education: Goal: Knowledge of risk factors and measures for prevention of condition will improve Outcome: Progressing   Problem: Coping: Goal: Psychosocial and spiritual needs will be supported Outcome: Progressing   Problem: Respiratory: Goal: Will maintain a patent airway Outcome: Progressing Goal: Complications related to the disease process, condition or treatment will be avoided or minimized Outcome: Progressing   Problem: Health Behavior/Discharge Planning: Goal: Ability to manage health-related needs will improve Outcome: Progressing   Problem: Clinical Measurements: Goal: Ability to maintain clinical measurements within normal limits will improve Outcome: Progressing Goal: Will remain free from infection Outcome: Progressing Goal: Diagnostic test results will improve Outcome: Progressing Goal: Respiratory complications will improve Outcome: Progressing Goal: Cardiovascular complication will be avoided Outcome: Progressing   Problem: Activity: Goal: Risk for activity intolerance will decrease Outcome: Progressing   Problem: Nutrition: Goal: Adequate nutrition will be maintained Outcome: Progressing   Problem: Coping: Goal: Level of anxiety will decrease Outcome: Progressing   Problem: Elimination: Goal: Will not experience complications related to bowel motility Outcome: Progressing Goal: Will not experience complications related to urinary retention Outcome: Progressing   Problem: Pain Managment: Goal: General experience of comfort will improve Outcome: Progressing   Problem: Safety: Goal: Ability to remain free from injury will improve Outcome: Progressing   Problem: Skin Integrity: Goal: Risk for impaired skin integrity will decrease Outcome: Progressing   

## 2020-01-25 NOTE — Progress Notes (Signed)
PROGRESS NOTE    Connor [name]  UXL:[id number] DOB: 1945/09/06 DOA: 01/01/2020 PCP: Dettinger, Fransisca Kaufmann, MD    No chief complaint on file.   Brief Narrative:  74 year old male with polycythemia vera, BPH, mild COPD, OSA on CPAP at home,strong family history ofcoronary artery disease, negative stress test 2007,diagnosed with Covid on 9/16. Unvaccinated against COVID-19. 9/16 diagnosed with COVID-19 at [facility] 9/22.  Monitor antibody infusion, after treatment, oxygen was 85% so referred to ER. Admitted to hospital 9/24 started on baricitinib. 9/26 completed remdesivir. 9/27 venous Doppler positive for right leg DVT started on Xarelto.  CT PE negative for PE. 9/30 CPAP transition to BiPAP, transferred to progressive care unit. 10/6 10/8 hypoxia worsening, transition to heated high flow nasal cannula. 10/9 pneumomediastinum on CT scan. BiPAP discontinued.  Incentive spirometer discontinued. In and out catheterization x1. 10/10 patient requested to be transition to complete comfort. Patient understood the meaning of DNR/DNI and wanted to transition to DNR. Wife and family members were allowed to visit the patient. 10/11 patient had a chance to visit all his family members.  Verified that he is still wants to continue with comfort care protocol.  Verified that he does not want any measures that will prolong his life including antibiotics. 10/12 due to persistent tachypnea and tachycardia with ongoing chest pain patient was started on morphine infusion at 2 mg/h.  Currently plan is continue comfort care, anticipating in hospital death.  Subjective:  He appears very weak, on heated high flow oxygen with NRB Wife and several visitors at bedside,  he is alert, does not appear in acute distress currently, wife reports patient had productive cough today, will ask RN to set up suction device as needed    Assessment & Plan:   Principal Problem:   Pneumonia due to  COVID-19 virus Active Problems:   Essential hypertension, benign   OSA (obstructive sleep apnea)   BPH (benign prostatic hyperplasia)   Hyperlipidemia LDL goal <100   Acute hypoxemic respiratory failure COVID-19 pneumonia. Acute on chronic HFpEF. Complicated by pneumomediastinum seen on 01/19/2020. Sinus tachycardia likely reactive due to above  CXR: hazy bilateral peripheral opacities CT chest: 9/23 GGO, negative for PE. CT chest: 10/9 consolidation with pneumomediastinum without PE Oxygen requirement:Heated high flow at 100% FiO2 with NRB. CRP: Dropped to 0.8. Trending up to 3.0 Steroids:Currently on IV Solu-Medrol.  Continue. Remdesivir:Completed 5-day treatment course Baricitinib/Actemra(off-label use):completed14-day treatment course on 01/17/2020. The investigational nature of this medication was discussed with the patient and they choose to proceed as the potential benefits are felt to outweigh risks at this time. Diuretics.Aggressively treated with IV Lasix 40 mg twice daily. Currently using Lasix as needed. Antibiotics:Currently not indicated Vitamin C and Zinc:Continued throughout the course.  Now comfort care. Pneumomediastinum:Discussed with PCCM. Currently recommending to hold off on BiPAP as well as incentive spirometry. No further intervention needed for now.  Now comfort care.   Isolation:CT value 30.3 on a Covid teston 01/18/2020. Isolation was removed on 01/21/2020 based on clinical examination.   Overall plan:Continue comfort care measures in the hospital.  Anticipating in hospital death  Appreciate palliative care input  Bilateral DVT: Initially treated with Lovenox and now on Xarelto. CT angio chest on 01/03/2020 negative for pulmonary embolism. Repeat CT scan on 01/19/2020 also negative for pulmonary embolism. Anticoagulation discontinued due to concerning GI bleed, family would like to discuss with patient regarding resuming  anticoagulation  GI bleed/constipation/sigmoid diverticulosis Patient had 4 bowel movement with blood in it  X-ray abdomen on 01/18/2020 shows stool burden primarily in the ascending colon again. CT abdomen 01/19/2020 shows rectal stool burden. Improved with Dulcolax suppository  Oral thrush. Was On nystatin.  Currently not able to get oral medication consistently due to requiring high amount of oxygen supplement  Essential hypertension: Blood pressure fairly stable, anticipating drop in blood pressure with comfort measures.  Hyperlipidemia:  On Crestor. Currently on hold given comfort care protocol.  Hyperbilirubinemia. Monitor for now.  Comfort care  Mild hyponatremia. Currently diuresis on hold.  Comfort care.  BPH. Continue current regimen. In and out catheterization/Foley catheter as needed for end-of-life care.  Vitamin D deficiency history. Resumedon 10/7/2021per family request, currently on comfort care protocol.    Polycythemia Vera-JAK2 negative and chronic immune thrombocytopenia Counts currently stable. Follows up with [name].  GERD. S/P Nissen fundoplication in 0254. Currently comfort care.  Sleep apnea: Was Using BiPAP at night with100% FiO2. CPAP at home. Currently PAP therapy on hold due to pneumomediastinum  Obesity Placing the patient at high risk for poor outcome from COVID-19 pneumonia. Body mass index is 31.38 kg/m.  Family has questions regarding visitation restrictions, made charge nurse /manager aware who is going to assist.   DVT prophylaxis: Place and maintain sequential compression device Start: 01/07/20 1315   Code Status: DNR Family Communication: Family at bedside Disposition:   Status is: Inpatient  Dispo: The patient is from: home              Anticipated d/c is to: TBD              Anticipated d/c date is: TBD              Patient currently not medically ready to discharge  Consultants:   Palliative  care   Objective: Vitals:   01/25/20 0400 01/25/20 0826 01/25/20 1319 01/25/20 1403  BP:   127/74   Pulse: (!) 135 (!) 139 (!) 144 (!) 143  Resp:  (!) 29 (!) 23 (!) 27  Temp:   98 F (36.7 C)   TempSrc:   Oral   SpO2: (!) 88% (!) 87% (!) 86% (!) 87%  Weight:      Height:        Intake/Output Summary (Last 24 hours) at 01/25/2020 1704 Last data filed at 01/25/2020 0430 Gross per 24 hour  Intake --  Output 1850 ml  Net -1850 ml   Filed Weights   01/10/2020 1626  Weight: 102.1 kg    Examination:  General exam: Appear weak, awake ,able to talk a few word. currently does not appear to be in no acute distress Respiratory system: intermittent Tachypnea on morphine drip Cardiovascular system: Tachycardia Gastrointestinal system:  Normal bowel sounds heard. Central nervous system: Alert awake today Extremities: Generalized weakness Psychiatry: Alert awake today    Data Reviewed: I have personally reviewed following labs and imaging studies  CBC: Recent Labs  Lab 01/19/20 0932 01/20/20 0523  WBC 38.5* 28.1*  NEUTROABS 35.8* 26.5*  HGB 17.7* 17.0  HCT 52.4* 50.1  MCV 87.3 87.4  PLT 252 270    Basic Metabolic Panel: Recent Labs  Lab 01/19/20 0544 01/19/20 0920 01/20/20 0523  NA  --  129* 129*  K  --  4.5 4.9  CL  --  88* 86*  CO2  --  25 30  GLUCOSE  --  169* 88  BUN  --  68* 59*  CREATININE  --  1.27* 1.07  CALCIUM  --  8.5* 8.8*  MG 2.6*  --  2.6*    GFR: Estimated Creatinine Clearance: 71.4 mL/min (by C-G formula based on SCr of 1.07 mg/dL).  Liver Function Tests: Recent Labs  Lab 01/19/20 0920 01/20/20 0523  AST 30 27  ALT 31 28  ALKPHOS 66 73  BILITOT 1.7* 1.5*  PROT 5.9* 5.7*  ALBUMIN 2.8* 2.7*    CBG: Recent Labs  Lab 01/19/20 0724 01/19/20 1135 01/19/20 1652 01/19/20 2048 01/20/20 0756  GLUCAP 116* 211* 116* 167* 109*     Recent Results (from the past 240 hour(s))  Respiratory Panel by RT PCR (Flu A&B, Covid) -  Nasopharyngeal Swab     Status: Abnormal   Collection Time: 01/18/20  4:44 PM   Specimen: Nasopharyngeal Swab  Result Value Ref Range Status   SARS Coronavirus 2 by RT PCR POSITIVE (A) NEGATIVE Final    Comment: RESULT CALLED TO, READ BACK BY AND VERIFIED WITH: HAMPTON,J RN @1824  ON 01/18/20 JACKSON,K (NOTE) SARS-CoV-2 target nucleic acids are DETECTED.  SARS-CoV-2 RNA is generally detectable in upper respiratory specimens  during the acute phase of infection. Positive results are indicative of the presence of the identified virus, but do not rule out bacterial infection or co-infection with other pathogens not detected by the test. Clinical correlation with patient history and other diagnostic information is necessary to determine patient infection status. The expected result is Negative.  Fact Sheet for Patients:  PinkCheek.be  Fact Sheet for Healthcare Providers: GravelBags.it  This test is not yet approved or cleared by the [place] FDA and  has been authorized for detection and/or diagnosis of SARS-CoV-2 by FDA under an Emergency Use Authorization (EUA).  This EUA will remain in effect (meaning this test ca n be used) for the duration of  the COVID-19 declaration under Section 564(b)(1) of the Act, 21 U.S.C. section 360bbb-3(b)(1), unless the authorization is terminated or revoked sooner.      Influenza A by PCR NEGATIVE NEGATIVE Final   Influenza B by PCR NEGATIVE NEGATIVE Final    Comment: (NOTE) The Xpert Xpress SARS-CoV-2/FLU/RSV assay is intended as an aid in  the diagnosis of influenza from Nasopharyngeal swab specimens and  should not be used as a sole basis for treatment. Nasal washings and  aspirates are unacceptable for Xpert Xpress SARS-CoV-2/FLU/RSV  testing.  Fact Sheet for Patients: PinkCheek.be  Fact Sheet for Healthcare  Providers: GravelBags.it  This test is not yet approved or cleared by the [place] FDA and  has been authorized for detection and/or diagnosis of SARS-CoV-2 by  FDA under an Emergency Use Authorization (EUA). This EUA will remain  in effect (meaning this test can be used) for the duration of the  Covid-19 declaration under Section 564(b)(1) of the Act, 21  U.S.C. section 360bbb-3(b)(1), unless the authorization is  terminated or revoked. Performed at [facility], [place] [address] [place], [place] [address]          Radiology Studies: No results found.      Scheduled Meds: . alfuzosin  10 mg Oral q morning - 10a  . feeding supplement (GLUCERNA SHAKE)  237 mL Oral TID BM  . lactulose  20 g Oral BID  . methylPREDNISolone (SOLU-MEDROL) injection  40 mg Intravenous Q12H  . nystatin  5 mL Oral QID  . Ensure Max Protein  11 oz Oral TID BM   Continuous Infusions: . sodium chloride    . morphine 2 mg/hr (01/24/20 0139)  LOS: 23 days   Time spent: 59mins, case discussed with palliative care FMLA paperwork filled out per family request on 10/13 I reviewed all nursing notes, consultant note, pertinent old records  I have discussed plan of care as described above with RN , patient and family on 01/25/2020  Voice Recognition /Dragon dictation system was used to create this note, attempts have been made to correct errors. Please contact the author with questions and/or clarifications.   Florencia Reasons, MD PhD FACP [facility]  Available via Epic secure chat 7am-7pm for nonurgent issues Please page for urgent issues To page the attending provider between 7A-7P or the covering provider during after hours 7P-7A, please log into the web site [email or web address] and access using universal [place] password for that web site. If you do not have the password, please call the hospital operator.    01/25/2020, 5:04 PM

## 2020-01-26 DIAGNOSIS — J1282 Pneumonia due to Coronavirus disease 2019: Secondary | ICD-10-CM | POA: Diagnosis not present

## 2020-01-26 DIAGNOSIS — Z7189 Other specified counseling: Secondary | ICD-10-CM | POA: Diagnosis not present

## 2020-01-26 DIAGNOSIS — U071 COVID-19: Secondary | ICD-10-CM | POA: Diagnosis not present

## 2020-01-26 DIAGNOSIS — R0602 Shortness of breath: Secondary | ICD-10-CM | POA: Diagnosis not present

## 2020-01-26 DIAGNOSIS — Z515 Encounter for palliative care: Secondary | ICD-10-CM | POA: Diagnosis not present

## 2020-01-26 MED ORDER — SODIUM CHLORIDE 0.9 % IV SOLN
2.0000 mg/h | INTRAVENOUS | Status: DC
  Administered 2020-01-26: 2 mg/h via INTRAVENOUS
  Filled 2020-01-26: qty 2.5

## 2020-01-26 MED ORDER — SCOPOLAMINE 1 MG/3DAYS TD PT72
1.0000 | MEDICATED_PATCH | TRANSDERMAL | Status: DC
  Administered 2020-01-26: 1.5 mg via TRANSDERMAL
  Filled 2020-01-26: qty 1

## 2020-01-26 MED ORDER — HYDROMORPHONE HCL 1 MG/ML IJ SOLN: 1.0000 mg | INTRAMUSCULAR | Status: DC | PRN

## 2020-01-26 MED ORDER — LORAZEPAM 2 MG/ML PO CONC: 1.0000 mg | ORAL | Status: DC | PRN

## 2020-01-26 MED ORDER — LORAZEPAM 1 MG PO TABS: 1.0000 mg | ORAL_TABLET | ORAL | Status: DC | PRN

## 2020-01-26 MED ORDER — LORAZEPAM 2 MG/ML IJ SOLN: 1.0000 mg | INTRAMUSCULAR | Status: DC | PRN

## 2020-02-11 ENCOUNTER — Telehealth: Payer: Medicare Other

## 2020-02-11 NOTE — Death Summary Note (Signed)
Discharge Summary  Connor [name]:[id number] DOB: 10-25-1945  PCP: Dettinger, Fransisca Kaufmann, MD  Admit date: 01-07-20 Death date: 15:00 on  31-Jan-2020  Time spent:  59mins  Discharge Diagnoses:  Active Hospital Problems   Diagnosis Date Noted  . Pneumonia due to COVID-19 virus Jan 07, 2020  . Hyperlipidemia LDL goal <100 04/12/2014  . BPH (benign prostatic hyperplasia)   . Essential hypertension, benign 08/27/2009  . OSA (obstructive sleep apnea) 02/19/2008    Resolved Hospital Problems  No resolved problems to display.     Filed Weights   01/07/2020 1626  Weight: 102.1 kg    History of present illness: (Per admitting MD Dr. Linda Hedges) Chief Complaint: Hypoxemia  HPI: Connor [name] is a 74 y.o. male with medical history significant of Polycythemia Vera, BPH, mild COPD, carotid artery disease was diagnosed as covid + 12/27/19. He had mild symptoms. He was scheduled for and did receive monoclonal antibody infusion today. After treatment his O2 sat was 85%. He was referred to [facility] for evaluation and admission. He is unvaccinated.   ED Course: Tmax 101.8  134/71  HR 101  RR 20  O2 sat 88% RA. Lab revealed glucose 135, Cr. 1.29 (1.10 11/22/19), AST 35, LDH 239, Ferritin 321, CRP 8.1, lactic acid 0.8, Procalcitonin <0.10, Didimer 0.4, WBC 2.4 w/ 81/12/6, HGB 14.8. CXR with mild multifocal ground glass infiltrates. In the ED he was given remdesivir IV and Decadron 10 mg IV. [facility] is called to admit the patient for continued treatment.  9/16 diagnosed with COVID-19 at [facility] 07-Jan-2023.Monitor antibody infusion, after treatment,oxygen was 85% so referred to ER. Admitted to hospital 9/24 started on baricitinib. 9/26 completed remdesivir. 9/27 venous Doppler positive for right leg DVT started on Xarelto. CT PE negative for PE. 9/30 CPAP transition to BiPAP, transferred to progressive care unit. 10/6 10/8 hypoxia worsening, transition to heated high flow nasal  cannula. 10/9 pneumomediastinum on CT scan. BiPAP discontinued.  Incentive spirometer discontinued. In and out catheterization x1. 10/10 patient requested to be transition to complete comfort. Patient understood the meaning of DNR/DNI and wanted to transition to DNR. Wife and family members were allowed to visit the patient. 10/11 patient had a chance to visit all his family members. Verified that he is still wants to continue with comfort care protocol. Verifiedthat he does not want any measures that will prolong his life including antibiotics. 10/12 due to persistent tachypnea and tachycardia with ongoing chest pain patient was started on morphine infusion at 2 mg/h. 01-31-2023: Patient started have tachypnea, morphine cream changed to Dilaudid with for comfort, patient passed away with family at bedside at 15:00 on 2023/01/31  Hospital Course:  Principal Problem:   Pneumonia due to COVID-19 virus Active Problems:   Essential hypertension, benign   OSA (obstructive sleep apnea)   BPH (benign prostatic hyperplasia)   Hyperlipidemia LDL goal <100   Acute hypoxemic respiratory failure COVID-19 pneumonia. Acute on chronic HFpEF. Complicated by pneumomediastinum seen on 01/19/2020. Sinus tachycardia likely reactive due to above  CXR: hazy bilateral peripheral opacities CT chest:9/23GGO,negative for PE. CT chest:10/9 consolidation with pneumomediastinum without PE Oxygen requirement:Heated high flow at 100% FiO2 with NRB. CRP: Dropped to 0.8. Trending up to 3.0 Steroids:Currently on IV Solu-Medrol.Continue. Remdesivir:Completed 5-day treatment course Baricitinib/Actemra(off-label use):completed14-day treatment course on 01/17/2020. The investigational nature of this medication was discussed with the patient and they choose to proceed as the potential benefits are felt to outweigh risks at this time. Diuretics.Aggressively treated with IV Lasix  40 mg twice  daily. Currently using Lasix as needed. Antibiotics:Currently not indicated Vitamin C and Zinc:Continued throughout the course. Now comfort care. Pneumomediastinum:Discussed with PCCM. Currently recommending to hold off on BiPAP as well as incentive spirometry. No further intervention needed for now.Isolation:CT value 30.3on a Covid teston 01/18/2020. Isolation was removed on 01/21/2020 based on clinical examination. He is started on  comfort care on 2023/02/16, passed away on 22-Feb-2023. Palliative care input appreciated.   Bilateral DVT: Initially treated with Lovenox and now on Xarelto. CT angio chest on 01/03/2020 negative for pulmonary embolism. Repeat CT scan on 01/19/2020 also negative for pulmonary embolism.  GI bleed/constipation/sigmoid diverticulosis Patient had 4 bowel movement with blood in it X-ray abdomen on 01/18/2020 shows stool burden primarily in the ascending colon again. CT abdomen 01/19/2020 shows rectal stool burden. Was treated request Dulcolax suppository  Oral thrush. Was treated with nystatin.     Polycythemia Vera-JAK2 negative and chronic immune thrombocytopenia  GERD. S/P Nissen fundoplication in 2633.   Sleep apnea: WasUsing BiPAP at night with100% FiO2. CPAP at home.  PAP therapy held in later part of the hospitalization due to pneumomediastinum  Obesity Placing the patient at high risk for poor outcomefrom COVID-19 pneumonia. Body mass index is 31.38 kg/m.  Other medical issues addressed in the hospital including essential hypertension, hyperlipidemia, hyper bilirubin anemia, mild hyponatremia, BPH, vitamin D deficiency, GERD     Allergies as of 02/22/20      Reactions   Dexlansoprazole Nausea And Vomiting   Celecoxib Other (See Comments)   Solid white - generic pill only = caused diarrhea (other generics ok)  Solid white - generic pill only = caused diarrhea (other generics ok)       Medication List    STOP  taking these medications   alfuzosin 10 MG 24 hr tablet Commonly known as: UROXATRAL   aspirin 81 MG tablet   Black Elderberry 50 MG/5ML Syrp   Centrum Silver Ultra Mens Tabs   cetirizine 10 MG tablet Commonly known as: ZYRTEC   CO Q 10 PO   cyclobenzaprine 10 MG tablet Commonly known as: FLEXERIL   ferrous sulfate 325 (65 FE) MG tablet   Glucosamine Chondr 1500 Complx Caps   Ipratropium-Albuterol 20-100 MCG/ACT Aers respimat Commonly known as: COMBIVENT   losartan 50 MG tablet Commonly known as: COZAAR   montelukast 10 MG tablet Commonly known as: SINGULAIR   NexIUM 24HR 20 MG Tbec Generic drug: Esomeprazole Magnesium   PHILLIPS [employer]ON [employer]ALTH PO   pyridOXINE 100 MG tablet Commonly known as: VITAMIN B-6   rosuvastatin 10 MG tablet Commonly known as: CRESTOR   sildenafil 20 MG tablet Commonly known as: REVATIO   Turmeric 500 MG Caps   vitamin B-12 500 MCG tablet Commonly known as: CYANOCOBALAMIN   vitamin C 100 MG tablet   Vitamin D (Ergocalciferol) 1.25 MG (50000 UNIT) Caps capsule Commonly known as: DRISDOL   Zinc 50 MG Tabs      Allergies  Allergen Reactions  . Dexlansoprazole Nausea And Vomiting  . Celecoxib Other (See Comments)    Solid white - generic pill only = caused diarrhea (other generics ok)  Solid white - generic pill only = caused diarrhea (other generics ok)       The results of significant diagnostics from this hospitalization (including imaging, microbiology, ancillary and laboratory) are listed below for reference.    Significant Diagnostic Studies: CT ANGIO CHEST PE W OR WO CONTRAST  Result Date: 01/19/2020 CLINICAL DATA:  74 year old male with positive COVID-19. Concern for pulmonary embolism. Abdominal pain. EXAM: CT ANGIOGRAPHY CHEST CT ABDOMEN AND PELVIS WITH CONTRAST TECHNIQUE: Multidetector CT imaging of the chest was performed using the standard protocol during bolus administration of intravenous contrast. Multiplanar  CT image reconstructions and MIPs were obtained to evaluate the vascular anatomy. Multidetector CT imaging of the abdomen and pelvis was performed using the standard protocol during bolus administration of intravenous contrast. CONTRAST:  16mL OMNIPAQUE IOHEXOL 350 MG/ML SOLN COMPARISON:  Chest CT dated 01/03/2020. FINDINGS: Evaluation of this exam is limited due to respiratory motion artifact. CTA CHEST FINDINGS Cardiovascular: There is no cardiomegaly or pericardial effusion. The thoracic aorta is unremarkable. Evaluation of the pulmonary arteries is limited due to respiratory motion artifact. There is a linear nonocclusive intravascular filling defect in the right upper lobe (165/3). It is indeterminate whether this is in a pulmonary artery branch or a branch of pulmonary vein. However, this appears to be involved in the pulmonary vein and likely related to motion or volume averaging artifact with adjacent structure. A pulmonary embolism is much less likely. No other large or central pulmonary artery embolus identified. Mediastinum/Nodes: There is no hilar or mediastinal adenopathy. The esophagus is grossly unremarkable. No mediastinal fluid collection. There is moderate pneumomediastinum extending to the neck, new since the prior CT. Lungs/Pleura: Bilateral streaky and hazy airspace opacities with predominant involvement of the subpleural lung bases in keeping with COVID-19. Overall improved aeration of the lungs compared to prior CT. No pleural effusion or pneumothorax. The central airways remain patent. Musculoskeletal: Degenerative changes of the spine. No acute osseous pathology. Review of the MIP images confirms the above findings. CT ABDOMEN and PELVIS FINDINGS No intra-abdominal free air or free fluid. Hepatobiliary: Several subcentimeter hepatic hypodense foci are too small to characterize. The liver is otherwise unremarkable. No intrahepatic biliary ductal dilatation. The gallbladder is unremarkable.  Pancreas: Unremarkable. No pancreatic ductal dilatation or surrounding inflammatory changes. Spleen: Normal in size without focal abnormality. Adrenals/Urinary Tract: The adrenal glands unremarkable. There is no hydronephrosis on either side. There is symmetric enhancement and excretion of contrast by both kidneys. There is a 1 cm left renal interpolar cyst. Additional smaller hypodense lesions in the kidneys are too small to characterize. The visualized ureters and urinary bladder appear unremarkable. Stomach/Bowel: There is moderate amount of stool throughout the colon and within the rectal vault. There is sigmoid diverticulosis without active inflammatory changes. There is no bowel obstruction or active inflammation. Appendectomy. Vascular/Lymphatic: Mild aortoiliac atherosclerotic disease. The IVC is unremarkable. No portal venous gas. There is no adenopathy. Reproductive: The prostate and seminal vesicles are grossly unremarkable. Other: None Musculoskeletal: No acute or significant osseous findings. Review of the MIP images confirms the above findings. IMPRESSION: 1. Moderate pneumomediastinum, new since the prior CT. 2. Apparent linear nonocclusive intravascular filling defect in the right upper lobe, likely artifactual and associated with pulmonary venous branch. No large or central pulmonary artery embolus identified. 3. Bilateral streaky and hazy airspace opacities with predominant involvement of the subpleural lung bases in keeping with COVID-19. Overall improved aeration of the lungs compared to prior CT. 4. Sigmoid diverticulosis. 5. Constipation no bowel obstruction. 6. Aortic Atherosclerosis (ICD10-I70.0). These results were called by telephone at the time of interpretation on 01/19/2020 at 4:23 pm to provider [facility] PATEL , who verbally acknowledged these results. Electronically Signed   By: Anner Crete [name]   On: 01/19/2020 16:22   CT ANGIO CHEST PE W OR WO CONTRAST  Result Date:  01/03/2020 CLINICAL DATA:  Polycythemia, recent COVID diagnosis, post monoclonal antibody infusion. Low O2 sats. EXAM: CT ANGIOGRAPHY CHEST WITH CONTRAST TECHNIQUE: Multidetector CT imaging of the chest was performed using the standard protocol during bolus administration of intravenous contrast. Multiplanar CT image reconstructions and MIPs were obtained to evaluate the vascular anatomy. Repeat injection was required because of poor enhancement on the first pass. CONTRAST:  135 mL OMNIPAQUE IOHEXOL 350 MG/ML SOLN COMPARISON:  06/06/2009 and previous FINDINGS: Cardiovascular: Heart size normal. No pericardial effusion. Satisfactory opacification of pulmonary arteries noted, and there is no evidence of pulmonary emboli. Adequate contrast opacification of the thoracic aorta with no evidence of dissection, aneurysm, or stenosis. There is classic 3-vessel brachiocephalic arch anatomy without proximal stenosis. Scattered calcified atheromatous plaque in the aortic isthmus. Mediastinum/Nodes: No mass or adenopathy. Lungs/Pleura: No pleural effusion. No pneumothorax. Extensive ground-glass and scattered airspace opacities throughout both lungs, involving bases more than apices, with a largely peripheral distribution. Upper Abdomen: No acute findings. Stable small hepatic cysts since 07/17/2008. Musculoskeletal: Anterior vertebral endplate spurring at multiple levels in the mid and lower thoracic spine. Review of the MIP images confirms the above findings. IMPRESSION: 1. Negative for acute PE or thoracic aortic dissection. 2. Extensive ground-glass and scattered airspace opacities throughout both lungs, involving bases more than apices, with a largely peripheral distribution. Imaging features can be seen with (COVID-19 or viral) pneumonia, though are nonspecific and can occur with a variety of infectious and noninfectious processes. Aortic Atherosclerosis (ICD10-I70.0). Electronically Signed   By: Lucrezia Europe [name]   On:  01/03/2020 15:05   CT ABDOMEN PELVIS W CONTRAST  Result Date: 01/19/2020 CLINICAL DATA:  74 year old male with positive COVID-19. Concern for pulmonary embolism. Abdominal pain. EXAM: CT ANGIOGRAPHY CHEST CT ABDOMEN AND PELVIS WITH CONTRAST TECHNIQUE: Multidetector CT imaging of the chest was performed using the standard protocol during bolus administration of intravenous contrast. Multiplanar CT image reconstructions and MIPs were obtained to evaluate the vascular anatomy. Multidetector CT imaging of the abdomen and pelvis was performed using the standard protocol during bolus administration of intravenous contrast. CONTRAST:  158mL OMNIPAQUE IOHEXOL 350 MG/ML SOLN COMPARISON:  Chest CT dated 01/03/2020. FINDINGS: Evaluation of this exam is limited due to respiratory motion artifact. CTA CHEST FINDINGS Cardiovascular: There is no cardiomegaly or pericardial effusion. The thoracic aorta is unremarkable. Evaluation of the pulmonary arteries is limited due to respiratory motion artifact. There is a linear nonocclusive intravascular filling defect in the right upper lobe (165/3). It is indeterminate whether this is in a pulmonary artery branch or a branch of pulmonary vein. However, this appears to be involved in the pulmonary vein and likely related to motion or volume averaging artifact with adjacent structure. A pulmonary embolism is much less likely. No other large or central pulmonary artery embolus identified. Mediastinum/Nodes: There is no hilar or mediastinal adenopathy. The esophagus is grossly unremarkable. No mediastinal fluid collection. There is moderate pneumomediastinum extending to the neck, new since the prior CT. Lungs/Pleura: Bilateral streaky and hazy airspace opacities with predominant involvement of the subpleural lung bases in keeping with COVID-19. Overall improved aeration of the lungs compared to prior CT. No pleural effusion or pneumothorax. The central airways remain patent.  Musculoskeletal: Degenerative changes of the spine. No acute osseous pathology. Review of the MIP images confirms the above findings. CT ABDOMEN and PELVIS FINDINGS No intra-abdominal free air or free fluid. Hepatobiliary: Several subcentimeter hepatic hypodense foci are too small to characterize. The liver is otherwise unremarkable. No intrahepatic  biliary ductal dilatation. The gallbladder is unremarkable. Pancreas: Unremarkable. No pancreatic ductal dilatation or surrounding inflammatory changes. Spleen: Normal in size without focal abnormality. Adrenals/Urinary Tract: The adrenal glands unremarkable. There is no hydronephrosis on either side. There is symmetric enhancement and excretion of contrast by both kidneys. There is a 1 cm left renal interpolar cyst. Additional smaller hypodense lesions in the kidneys are too small to characterize. The visualized ureters and urinary bladder appear unremarkable. Stomach/Bowel: There is moderate amount of stool throughout the colon and within the rectal vault. There is sigmoid diverticulosis without active inflammatory changes. There is no bowel obstruction or active inflammation. Appendectomy. Vascular/Lymphatic: Mild aortoiliac atherosclerotic disease. The IVC is unremarkable. No portal venous gas. There is no adenopathy. Reproductive: The prostate and seminal vesicles are grossly unremarkable. Other: None Musculoskeletal: No acute or significant osseous findings. Review of the MIP images confirms the above findings. IMPRESSION: 1. Moderate pneumomediastinum, new since the prior CT. 2. Apparent linear nonocclusive intravascular filling defect in the right upper lobe, likely artifactual and associated with pulmonary venous branch. No large or central pulmonary artery embolus identified. 3. Bilateral streaky and hazy airspace opacities with predominant involvement of the subpleural lung bases in keeping with COVID-19. Overall improved aeration of the lungs compared to  prior CT. 4. Sigmoid diverticulosis. 5. Constipation no bowel obstruction. 6. Aortic Atherosclerosis (ICD10-I70.0). These results were called by telephone at the time of interpretation on 01/19/2020 at 4:23 pm to provider [facility] PATEL , who verbally acknowledged these results. Electronically Signed   By: Anner Crete [name]   On: 01/19/2020 16:22   DG CHEST PORT 1 VIEW  Result Date: 01/20/2020 CLINICAL DATA:  COVID EXAM: PORTABLE CHEST 1 VIEW COMPARISON:  January 18, 2020, ectopia ninth 2021 FINDINGS: The cardiomediastinal silhouette is unchanged in contour.Atherosclerotic calcifications of the aorta. Pneumomediastinum seen on recent CT has decreased in conspicuity. No pleural effusion. No significant pneumothorax. Multifocal bilateral peripheral predominant heterogeneous opacities, consistent with the sequela of COVID-19 infection. Visualized abdomen is unremarkable. Multilevel degenerative changes of the thoracic spine. IMPRESSION: 1. Multifocal peripheral predominant heterogeneous opacities, consistent with the sequela of COVID-19 infection. 2. Decreased conspicuity of pneumomediastinum. Electronically Signed   By: Valentino Saxon MD   On: 01/20/2020 16:19   DG CHEST PORT 1 VIEW  Result Date: 01/18/2020 CLINICAL DATA:  Coughing and shortness of breath EXAM: PORTABLE CHEST 1 VIEW COMPARISON:  01/08/2020 FINDINGS: Persistent bilateral opacities. No significant pleural effusion. No pneumothorax. Stable cardiomediastinal contours. IMPRESSION: Persistent bilateral pulmonary opacities similar to prior study. Electronically Signed   By: Macy Mis [name]   On: 01/18/2020 09:31   DG CHEST PORT 1 VIEW  Result Date: 01/08/2020 CLINICAL DATA:  Pneumonia.  COVID positive. EXAM: PORTABLE CHEST 1 VIEW COMPARISON:  CT 01/03/2020.  01/10/2020. FINDINGS: Cardiomegaly. Diffuse bilateral pulmonary infiltrates/edema again noted. No interim change from prior exam. No pleural effusion or pneumothorax. IMPRESSION: 1.  Cardiomegaly. 2. Diffuse bilateral pulmonary infiltrates/edema again noted. No interim change from prior exam. Electronically Signed   By: Marcello Moores  Register   On: 01/08/2020 09:15   DG Chest Port 1 View  Result Date: 12/30/2019 CLINICAL DATA:  COVID-19, worsening shortness of breath and oxygen requirement after monoclonal antibody infusion EXAM: PORTABLE CHEST 1 VIEW COMPARISON:  07/02/2016 FINDINGS: Single frontal view of the chest demonstrates an enlarged cardiac silhouette. There is basilar predominant interstitial and ground-glass opacities, which could reflect edema or multifocal pneumonia. No effusion or pneumothorax. No acute bony abnormalities. IMPRESSION: 1. Basilar predominant interstitial and  ground-glass opacities, which could reflect multifocal pneumonia or edema. Electronically Signed   By: Randa Ngo [name]   On: 12/12/2019 17:01   DG Abd Portable 1V  Result Date: 01/18/2020 CLINICAL DATA:  Constipation, generalized abdominal pain EXAM: PORTABLE ABDOMEN - 1 VIEW COMPARISON:  01/16/2020 FINDINGS: The bowel gas pattern is normal. No radio-opaque calculi or other significant radiographic abnormality are seen. Mild volume of stool projects throughout the colon, not substantially increased from prior. IMPRESSION: Nonobstructive bowel gas pattern. Mild volume of stool throughout the colon. Electronically Signed   By: Davina Poke D.O.   On: 01/18/2020 09:34   DG Abd Portable 1V  Result Date: 01/16/2020 CLINICAL DATA:  COVID with abdominal pain EXAM: PORTABLE ABDOMEN - 1 VIEW COMPARISON:  11/29/2017 FINDINGS: The bowel gas pattern is normal. No radio-opaque calculi or other significant radiographic abnormality are seen. IMPRESSION: Negative. Electronically Signed   By: Donavan Foil [name]   On: 01/16/2020 18:46   VAS [place] LOWER EXTREMITY VENOUS (DVT)  Result Date: 01/07/2020  Lower Venous DVT Study Indications: Elevated Ddimer.  Risk Factors: COVID 19 positive. Comparison Study: No prior  studies. Performing Technologist: Oliver Hum RVT  Examination Guidelines: A complete evaluation includes B-mode imaging, spectral Doppler, color Doppler, and power Doppler as needed of all accessible portions of each vessel. Bilateral testing is considered an integral part of a complete examination. Limited examinations for reoccurring indications may be performed as noted. The reflux portion of the exam is performed with the patient in reverse Trendelenburg.  +---------+---------------+---------+-----------+----------+--------------+ RIGHT    CompressibilityPhasicitySpontaneityPropertiesThrombus Aging +---------+---------------+---------+-----------+----------+--------------+ CFV      Full           Yes      Yes                                 +---------+---------------+---------+-----------+----------+--------------+ SFJ      Full                                                        +---------+---------------+---------+-----------+----------+--------------+ FV Prox  Full                                                        +---------+---------------+---------+-----------+----------+--------------+ FV Mid   Full                                                        +---------+---------------+---------+-----------+----------+--------------+ FV DistalFull                                                        +---------+---------------+---------+-----------+----------+--------------+ PFV      Full                                                        +---------+---------------+---------+-----------+----------+--------------+  POP      Full           Yes      Yes                                 +---------+---------------+---------+-----------+----------+--------------+ PTV      Full                                                        +---------+---------------+---------+-----------+----------+--------------+ PERO     Partial                                       Acute          +---------+---------------+---------+-----------+----------+--------------+   +---------+---------------+---------+-----------+----------+--------------+ LEFT     CompressibilityPhasicitySpontaneityPropertiesThrombus Aging +---------+---------------+---------+-----------+----------+--------------+ CFV      Full           Yes      Yes                                 +---------+---------------+---------+-----------+----------+--------------+ SFJ      Full                                                        +---------+---------------+---------+-----------+----------+--------------+ FV Prox  Full                                                        +---------+---------------+---------+-----------+----------+--------------+ FV Mid   Full                                                        +---------+---------------+---------+-----------+----------+--------------+ FV DistalFull                                                        +---------+---------------+---------+-----------+----------+--------------+ PFV      Full                                                        +---------+---------------+---------+-----------+----------+--------------+ POP      Full           Yes      Yes                                 +---------+---------------+---------+-----------+----------+--------------+  PTV      Full                                                        +---------+---------------+---------+-----------+----------+--------------+ PERO     Full                                                        +---------+---------------+---------+-----------+----------+--------------+     Summary: RIGHT: - Findings consistent with acute deep vein thrombosis involving the right peroneal veins. - No cystic structure found in the popliteal fossa.  LEFT: - There is no evidence of deep vein thrombosis in the lower  extremity.  - No cystic structure found in the popliteal fossa.  *See table(s) above for measurements and observations. Electronically signed by Ruta Hinds MD on 01/07/2020 at 2:54:27 PM.    Final    ECHOCARDIOGRAM LIMITED  Result Date: 01/11/2020    ECHOCARDIOGRAM LIMITED REPORT   Patient Name:   AMROM ORE Date of Exam: 01/11/2020 Medical Rec #:  [id number]       Height:       71.0 in Accession #:    [id number]      Weight:       225.0 lb Date of Birth:  05/12/1945       BSA:          2.217 m Patient Age:    31 years        BP:           141/74 mmHg Patient Gender: M               HR:           97 bpm. Exam Location:  Inpatient Procedure: Limited Echo and Color Doppler Indications:    Acute Respiratory Insufficiency R06.89  History:        Patient has prior history of Echocardiogram examinations, most                 recent 02/11/2014. COPD; Risk Factors:Hypertension, Dyslipidemia,                 Sleep Apnea and COVID+ 12/27/19.  Sonographer:    Raquel Sarna Senior RDCS Referring Phys: [id number] [place]  1. Limited study for LV function; full doppler not performed; vigorous LV systolic function.  2. Left ventricular ejection fraction, by estimation, is 70 to 75%. The left ventricle has hyperdynamic function. There is mild left ventricular hypertrophy of the basal-septal segment.  3. Right ventricular systolic function is normal. The right ventricular size is normal.  4. The mitral valve is normal in structure. No evidence of mitral valve regurgitation.  5. The aortic valve is tricuspid. Aortic valve regurgitation is not visualized.  6. The inferior vena cava is normal in size with greater than 50% respiratory variability, suggesting right atrial pressure of 3 mmHg. FINDINGS  Left Ventricle: Left ventricular ejection fraction, by estimation, is 70 to 75%. The left ventricle has hyperdynamic function. The left ventricular internal cavity size was normal in size. There is mild left ventricular  hypertrophy of the basal-septal segment. Right Ventricle: The right ventricular size is normal. Right  ventricular systolic function is normal. Left Atrium: Left atrial size was normal in size. Right Atrium: Right atrial size was normal in size. Pericardium: There is no evidence of pericardial effusion. Mitral Valve: The mitral valve is normal in structure. Tricuspid Valve: The tricuspid valve is normal in structure. Aortic Valve: The aortic valve is tricuspid. Aortic valve regurgitation is not visualized. Pulmonic Valve: The pulmonic valve was normal in structure. Venous: The inferior vena cava is normal in size with greater than 50% respiratory variability, suggesting right atrial pressure of 3 mmHg. Additional Comments: Limited study for LV function; full doppler not performed; vigorous LV systolic function. RIGHT VENTRICLE RV S prime:     24.10 cm/s TAPSE (M-mode): 2.5 cm Kirk Ruths MD Electronically signed by Kirk Ruths MD Signature Date/Time: 01/11/2020/11:29:23 AM    Final     Microbiology: Recent Results (from the past 240 hour(s))  Respiratory Panel by RT PCR (Flu A&B, Covid) - Nasopharyngeal Swab     Status: Abnormal   Collection Time: 01/18/20  4:44 PM   Specimen: Nasopharyngeal Swab  Result Value Ref Range Status   SARS Coronavirus 2 by RT PCR POSITIVE (A) NEGATIVE Final    Comment: RESULT CALLED TO, READ BACK BY AND VERIFIED WITH: HAMPTON,J RN @1824  ON 01/18/20 JACKSON,K (NOTE) SARS-CoV-2 target nucleic acids are DETECTED.  SARS-CoV-2 RNA is generally detectable in upper respiratory specimens  during the acute phase of infection. Positive results are indicative of the presence of the identified virus, but do not rule out bacterial infection or co-infection with other pathogens not detected by the test. Clinical correlation with patient history and other diagnostic information is necessary to determine patient infection status. The expected result is Negative.  Fact Sheet for  Patients:  PinkCheek.be  Fact Sheet for Healthcare Providers: GravelBags.it  This test is not yet approved or cleared by the [place] FDA and  has been authorized for detection and/or diagnosis of SARS-CoV-2 by FDA under an Emergency Use Authorization (EUA).  This EUA will remain in effect (meaning this test ca n be used) for the duration of  the COVID-19 declaration under Section 564(b)(1) of the Act, 21 U.S.C. section 360bbb-3(b)(1), unless the authorization is terminated or revoked sooner.      Influenza A by PCR NEGATIVE NEGATIVE Final   Influenza B by PCR NEGATIVE NEGATIVE Final    Comment: (NOTE) The Xpert Xpress SARS-CoV-2/FLU/RSV assay is intended as an aid in  the diagnosis of influenza from Nasopharyngeal swab specimens and  should not be used as a sole basis for treatment. Nasal washings and  aspirates are unacceptable for Xpert Xpress SARS-CoV-2/FLU/RSV  testing.  Fact Sheet for Patients: PinkCheek.be  Fact Sheet for Healthcare Providers: GravelBags.it  This test is not yet approved or cleared by the [place] FDA and  has been authorized for detection and/or diagnosis of SARS-CoV-2 by  FDA under an Emergency Use Authorization (EUA). This EUA will remain  in effect (meaning this test can be used) for the duration of the  Covid-19 declaration under Section 564(b)(1) of the Act, 21  U.S.C. section 360bbb-3(b)(1), unless the authorization is  terminated or revoked. Performed at [facility], [place] [address], [place], [place] [address]      Labs: Basic Metabolic Panel: Recent Labs  Lab 01/20/20 0523  NA 129*  K 4.9  CL 86*  CO2 30  GLUCOSE 88  BUN 59*  CREATININE 1.07  CALCIUM 8.8*  MG 2.6*   Liver Function Tests: Recent  Labs  Lab 01/20/20 0523  AST 27  ALT 28  ALKPHOS 73  BILITOT 1.5*  PROT 5.7*   ALBUMIN 2.7*   No results for input(s): LIPASE, AMYLASE in the last 168 hours. No results for input(s): AMMONIA in the last 168 hours. CBC: Recent Labs  Lab 01/20/20 0523  WBC 28.1*  NEUTROABS 26.5*  HGB 17.0  HCT 50.1  MCV 87.4  PLT 209   Cardiac Enzymes: No results for input(s): CKTOTAL, CKMB, CKMBINDEX, TROPONINI in the last 168 hours. BNP: BNP (last 3 results) No results for input(s): BNP in the last 8760 hours.  ProBNP (last 3 results) No results for input(s): PROBNP in the last 8760 hours.  CBG: Recent Labs  Lab 01/19/20 1652 01/19/20 2048 01/20/20 0756  GLUCAP 116* 167* 109*       Signed:  Florencia Reasons MD, PhD, FACP  [facility] February 05, 2020, 3:37 PM

## 2020-02-11 NOTE — Progress Notes (Signed)
Pt's wife and daughters were bedside when I arrived. Pt's wife was appropriately tearful and spoke of how her husband had made his peace with God and is ready. She talked about how he had asked her if she had thought about who would preach his funeral. She said she responded she had not thought about a funeral but thought of him going home. Pt's wife has accepted now what seems imminent. She said she does not want to be selfish in wanting to keep him here to suffer. After providing pastoral presence and emotional support, I had prayer with family bedside of pt. They played his favorite song and cried as they each held a part of him in their hands.  Please page if additional assistance is needed. [place], [place]   02-04-2020 1300  Clinical Encounter Type  Visited With Family

## 2020-02-11 NOTE — Progress Notes (Signed)
   01/22/2020 1500  Attending Physican Contact  Attending Physician Notified Y  Attending Physician (First and Last Name) Fang Xu, MD  Will the above attending physician sign death certificate? Yes  Post Mortem Checklist  Date of Death 01/14/2020  Time of Death 1500  Pronounced By Briana Holt, RN  ,RN  Next of kin notified Yes (@ bedside)  Name of next of kin notified of death Carolyn Fahl  Contact Person's Relationship to Patient Spouse  Contact Person's Phone Number [phone]  Contact Person's address [address]   Was the patient a No Code Blue or a Limited Code Blue? Yes  Did the patient die unattended? No  Patient restrained? Not applicable  Height 5' 11" (1.803 m)  Weight 102.1 kg  [place] [facility]  Notification Date 02/03/2020  Notification Time 1524   Donor Service Number 01/21/2020-076  Is patient a potential donor? Y  Donation Type Tissue  Autopsy  Autopsy requested by N/A  Patient and Hospital Property Returned  Patient belongings from bedside/safe/pharmacy returned  Yes  Valuables returned to? spouse  Specify valuables returned blanket  Dermatherapy linen/gowns NOT sent with patient or transporter Disposable Patient Transfer/ Apparel Kit used  Notifications  Patient Placement notified that Post Mortem checklist is complete Yes  Patient Placement notified body transferred Transported to morgue  Medical Examiner  Is this a medical examiner's case? N  Funeral Home  Funeral home name/address/phone # [facility] - [address] [place] - [phone]  Planned location of pickup Other (Comment)   

## 2020-02-11 NOTE — Progress Notes (Signed)
Patient morphine drip was discontinued to dilaudid drip, noted. Morphine wasted by 2 RNs. Total wasted 38ml noted.

## 2020-02-11 NOTE — Progress Notes (Signed)
Dilaudid wasted with two Rns Probation officer and Everlean Cherry . Amount wasted 90mL, noted. Medication was propertly disposed in the medication room, noted.

## 2020-02-11 NOTE — Progress Notes (Signed)
Daily Progress Note   Patient Name: Connor [name]       Date: 2020/02/15 DOB: [birth year]  Age: 74 y.o. MRN#: [id number] Attending Physician: Florencia Reasons, MD Primary Care Physician: Dettinger, Fransisca Kaufmann, MD Admit Date: 01/05/2020  Reason for Consultation/Follow-up: Non pain symptom management, Pain control and Psychosocial/spiritual support  Subjective: [name] is in resp distress this morning, with accompanying tachycardia, using accessory muscles of respiration, with declining O2 saturations in spite of current O2 settings, frank and compassionate conversations with wife and daughter that patient is transitioning towards end of life and prognosis appears to be limited to few hours at this point, in my opinion.   Length of Stay: 24  Current Medications: Scheduled Meds:  . alfuzosin  10 mg Oral q morning - 10a  . feeding supplement (GLUCERNA SHAKE)  237 mL Oral TID BM  . lactulose  20 g Oral BID  . methylPREDNISolone (SOLU-MEDROL) injection  40 mg Intravenous Q12H  . nystatin  5 mL Oral QID  . Ensure Max Protein  11 oz Oral TID BM  . scopolamine  1 patch Transdermal Q72H    Continuous Infusions: . sodium chloride    . HYDROmorphone    . morphine 2 mg/hr (02-15-20 0403)    PRN Meds: sodium chloride, acetaminophen **OR** acetaminophen, albuterol, bisacodyl, chlorpheniramine-HYDROcodone, diphenhydrAMINE, glycopyrrolate **OR** glycopyrrolate **OR** glycopyrrolate, guaiFENesin-dextromethorphan, haloperidol **OR** haloperidol **OR** haloperidol lactate, HYDROmorphone **AND** HYDROmorphone (DILAUDID) injection, LORazepam **OR** LORazepam **OR** LORazepam, LORazepam, magnesium hydroxide, metoprolol tartrate, morphine injection, ondansetron **OR** ondansetron (ZOFRAN) IV, sodium  chloride  Physical Exam         Awake, appears fatigued Diminished breath sounds towards bases S1-S2 tachycardic Abdomen is not distended Extremities are cool to touch Appears with generalized weakness Not as alert Resp distress evident, patient using accessory muscles of respiration  Vital Signs: BP 130/83   Pulse (!) 155   Temp 99.5 F (37.5 C)   Resp (!) 22   Ht 5\' 11"  (1.803 m)   Wt 102.1 kg   SpO2 (!) 84%   BMI 31.38 kg/m  SpO2: SpO2: (!) 84 % O2 Device: O2 Device: NRB, High Flow Nasal Cannula O2 Flow Rate: O2 Flow Rate (L/min): 35 L/min  Intake/output summary:   Intake/Output Summary (Last 24 hours) at 02-15-2020 1002 Last data filed at 02-15-2020 0600 Gross  per 24 hour  Intake 23.89 ml  Output 2100 ml  Net -2076.11 ml   LBM: Last BM Date: 01/21/20 Baseline Weight: Weight: 102.1 kg Most recent weight: Weight: 102.1 kg       Palliative Assessment/Data:      Patient Active Problem List   Diagnosis Date Noted  . Pneumonia due to COVID-19 virus 01/01/2020  . Prediabetes 02/12/2019  . Transient alteration of awareness 02/01/2019  . Left carotid bruit 02/01/2019  . Pustule of nostril 10/20/2017  . Squamous cell cancer of skin of nose 07/01/2017  . Erectile dysfunction due to arterial insufficiency 07/01/2017  . 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 Iron malabsorption 05/28/2016  . Torus mandibularis 03/24/2016  . Hyperlipidemia LDL goal <100 04/12/2014  . Ptosis 07/19/2013  . Polycythemia vera ([place]) 04/19/2013  . Family history of early CAD 02/19/2013  . Iron deficiency anemia 09/01/2012  . H/O vitamin D deficiency 05/21/2012  . Diverticulosis of colon 05/21/2012  . Carotid artery stenosis, asymptomatic 05/21/2012  . BPH (benign prostatic hyperplasia)   . Cardiomegaly   . Thrombocytopenia ([place])   . Lap Nissen Fundoplication August 4650 11/26/2011  . Essential hypertension, benign 08/27/2009  . OSA (obstructive sleep apnea) 02/19/2008  .  COPD, mild ([place]) 05/26/2007    Palliative Care Assessment & Plan   Patient Profile:   Assessment: 74 year old gentleman with polycythemia vera, BPH, mild COPD, obstructive sleep apnea on CPAP at home, family history of coronary artery disease, diagnosed with Covid on 9/16. Rested reasonably well overnight on 10-13.  At times, the patient has been telling his family members that he is very tired and that he is ready to move on. Recommendations/Plan:  Acutely dyspneic this morning, less alert, with resp distress, high heart rate, possibly in the active dying process, prognosis likely not more than few hours at this point. Wife and daughter at bedside are aware of the patient's ongoing decline, re discussed goals of care, they state that primary and singular goal at this time is for full comfort care and avoidance of suffering at end of life.   Opioid rotate to Dilaudid infusion  Add Ativan IV PRN  Add Scopolamine patch  Will request on call chaplain consult for additional support for patient and family.   Unrestricted visitor access  Prognosis likely as short as few hours, anticipate hospital death.   Goals of Care and Additional Recommendations:  Limitations on Scope of Treatment: Full Comfort Care  Code Status:    Code Status Orders  (From admission, onward)         Start     Ordered   01/22/20 1659  Do not attempt resuscitation (DNR)  Continuous       Question Answer Comment  In the event of cardiac or respiratory ARREST Do not call a "code blue"   In the event of cardiac or respiratory ARREST Do not perform Intubation, CPR, defibrillation or ACLS   In the event of cardiac or respiratory ARREST Use medication by any route, position, wound care, and other measures to relive pain and suffering. May use oxygen, suction and manual treatment of airway obstruction as needed for comfort.      01/22/20 1700        Code Status History    Date Active Date Inactive Code  Status Order ID Comments User Context   01/20/2020 1136 01/22/2020 1700 DNR [id number]  Lavina Hamman, MD Inpatient   01/09/2020 2257 01/20/2020 1136 Full Code [id number]  Norins,  Heinz Knuckles, MD ED   Advance Care Planning Activity       Prognosis:      few hours, as patient with declining O2 saturations, in spite of high O2 requirements currently.  Discharge Planning:  Anticipated Hospital Death  Care plan was discussed with patient and daughter.   Thank you for allowing the Palliative Medicine Team to assist in the care of this patient.   Time In: 9 Time Out: 9.35 Total Time 35 Prolonged Time Billed  no       Greater than 50%  of this time was spent counseling and coordinating care related to the above assessment and plan.  Loistine Chance, MD  Please contact Palliative Medicine Team phone at [phone] for questions and concerns.

## 2020-02-11 DEATH — deceased

## 2020-02-21 ENCOUNTER — Ambulatory Visit: Payer: Medicare Other | Admitting: Family Medicine
